# Patient Record
Sex: Female | Born: 1935 | Race: White | Hispanic: No | Marital: Married | Smoking: Never smoker
Health system: Southern US, Community
[De-identification: ages and names within clinical notes are randomized; demographics above are authoritative.]

## PROBLEM LIST (undated history)

## (undated) DIAGNOSIS — E785 Hyperlipidemia, unspecified: Secondary | ICD-10-CM

## (undated) DIAGNOSIS — L509 Urticaria, unspecified: Secondary | ICD-10-CM

## (undated) DIAGNOSIS — I4819 Other persistent atrial fibrillation: Secondary | ICD-10-CM

## (undated) DIAGNOSIS — I469 Cardiac arrest, cause unspecified: Secondary | ICD-10-CM

## (undated) DIAGNOSIS — I428 Other cardiomyopathies: Secondary | ICD-10-CM

## (undated) DIAGNOSIS — I442 Atrioventricular block, complete: Secondary | ICD-10-CM

## (undated) DIAGNOSIS — I10 Essential (primary) hypertension: Secondary | ICD-10-CM

## (undated) DIAGNOSIS — N19 Unspecified kidney failure: Secondary | ICD-10-CM

## (undated) DIAGNOSIS — H269 Unspecified cataract: Secondary | ICD-10-CM

## (undated) DIAGNOSIS — E039 Hypothyroidism, unspecified: Secondary | ICD-10-CM

## (undated) HISTORY — DX: Unspecified cataract: H26.9

## (undated) HISTORY — DX: Other persistent atrial fibrillation: I48.19

## (undated) HISTORY — DX: Essential (primary) hypertension: I10

## (undated) HISTORY — PX: PACEMAKER INSERTION: SHX728

## (undated) HISTORY — DX: Hyperlipidemia, unspecified: E78.5

## (undated) HISTORY — DX: Atrioventricular block, complete: I44.2

## (undated) HISTORY — DX: Hypothyroidism, unspecified: E03.9

## (undated) HISTORY — DX: Other cardiomyopathies: I42.8

## (undated) HISTORY — DX: Unspecified kidney failure: N19

## (undated) HISTORY — PX: CYST EXCISION: SHX5701

## (undated) HISTORY — DX: Urticaria, unspecified: L50.9

---

## 1985-03-03 HISTORY — PX: ABDOMINAL HYSTERECTOMY: SHX81

## 1994-03-03 HISTORY — PX: BUNIONECTOMY: SHX129

## 1997-01-01 HISTORY — PX: BREAST BIOPSY: SHX20

## 1997-11-08 ENCOUNTER — Other Ambulatory Visit: Admission: RE | Admit: 1997-11-08 | Discharge: 1997-11-08 | Payer: Self-pay | Admitting: Obstetrics and Gynecology

## 1998-12-11 ENCOUNTER — Other Ambulatory Visit: Admission: RE | Admit: 1998-12-11 | Discharge: 1998-12-11 | Payer: Self-pay | Admitting: Obstetrics and Gynecology

## 1999-12-11 ENCOUNTER — Other Ambulatory Visit: Admission: RE | Admit: 1999-12-11 | Discharge: 1999-12-11 | Payer: Self-pay | Admitting: Obstetrics and Gynecology

## 2000-03-03 DIAGNOSIS — I442 Atrioventricular block, complete: Secondary | ICD-10-CM

## 2000-03-03 HISTORY — DX: Atrioventricular block, complete: I44.2

## 2000-12-17 ENCOUNTER — Other Ambulatory Visit: Admission: RE | Admit: 2000-12-17 | Discharge: 2000-12-17 | Payer: Self-pay | Admitting: Obstetrics and Gynecology

## 2001-11-29 ENCOUNTER — Encounter: Admission: RE | Admit: 2001-11-29 | Discharge: 2001-11-29 | Payer: Self-pay | Admitting: Obstetrics and Gynecology

## 2001-11-29 ENCOUNTER — Encounter: Payer: Self-pay | Admitting: Obstetrics and Gynecology

## 2002-02-02 ENCOUNTER — Encounter (INDEPENDENT_AMBULATORY_CARE_PROVIDER_SITE_OTHER): Payer: Self-pay | Admitting: *Deleted

## 2002-02-02 ENCOUNTER — Ambulatory Visit (HOSPITAL_COMMUNITY): Admission: RE | Admit: 2002-02-02 | Discharge: 2002-02-02 | Payer: Self-pay | Admitting: *Deleted

## 2002-02-15 ENCOUNTER — Encounter: Payer: Self-pay | Admitting: General Surgery

## 2002-02-15 ENCOUNTER — Encounter: Admission: RE | Admit: 2002-02-15 | Discharge: 2002-02-15 | Payer: Self-pay | Admitting: General Surgery

## 2002-03-03 HISTORY — PX: OTHER SURGICAL HISTORY: SHX169

## 2002-03-07 ENCOUNTER — Encounter: Payer: Self-pay | Admitting: General Surgery

## 2002-03-11 ENCOUNTER — Encounter (INDEPENDENT_AMBULATORY_CARE_PROVIDER_SITE_OTHER): Payer: Self-pay | Admitting: Specialist

## 2002-03-11 ENCOUNTER — Inpatient Hospital Stay (HOSPITAL_COMMUNITY): Admission: RE | Admit: 2002-03-11 | Discharge: 2002-03-19 | Payer: Self-pay | Admitting: General Surgery

## 2002-08-05 ENCOUNTER — Encounter: Payer: Self-pay | Admitting: Obstetrics and Gynecology

## 2002-08-05 ENCOUNTER — Encounter: Admission: RE | Admit: 2002-08-05 | Discharge: 2002-08-05 | Payer: Self-pay | Admitting: Obstetrics and Gynecology

## 2003-03-24 ENCOUNTER — Ambulatory Visit (HOSPITAL_COMMUNITY): Admission: RE | Admit: 2003-03-24 | Discharge: 2003-03-24 | Payer: Self-pay | Admitting: *Deleted

## 2004-02-09 ENCOUNTER — Encounter: Admission: RE | Admit: 2004-02-09 | Discharge: 2004-02-09 | Payer: Self-pay | Admitting: Obstetrics and Gynecology

## 2005-02-28 ENCOUNTER — Encounter: Admission: RE | Admit: 2005-02-28 | Discharge: 2005-02-28 | Payer: Self-pay | Admitting: Obstetrics and Gynecology

## 2006-03-04 ENCOUNTER — Encounter: Admission: RE | Admit: 2006-03-04 | Discharge: 2006-03-04 | Payer: Self-pay | Admitting: Obstetrics and Gynecology

## 2006-06-16 ENCOUNTER — Encounter (INDEPENDENT_AMBULATORY_CARE_PROVIDER_SITE_OTHER): Payer: Self-pay | Admitting: Specialist

## 2006-06-16 ENCOUNTER — Ambulatory Visit (HOSPITAL_COMMUNITY): Admission: RE | Admit: 2006-06-16 | Discharge: 2006-06-16 | Payer: Self-pay | Admitting: Gastroenterology

## 2007-03-08 ENCOUNTER — Encounter: Admission: RE | Admit: 2007-03-08 | Discharge: 2007-03-08 | Payer: Self-pay | Admitting: Obstetrics & Gynecology

## 2007-04-09 ENCOUNTER — Inpatient Hospital Stay (HOSPITAL_COMMUNITY): Admission: EM | Admit: 2007-04-09 | Discharge: 2007-04-13 | Payer: Self-pay | Admitting: Emergency Medicine

## 2007-04-09 ENCOUNTER — Ambulatory Visit: Payer: Self-pay | Admitting: Cardiovascular Disease

## 2007-04-09 ENCOUNTER — Encounter (INDEPENDENT_AMBULATORY_CARE_PROVIDER_SITE_OTHER): Payer: Self-pay | Admitting: Interventional Cardiology

## 2008-03-17 ENCOUNTER — Encounter: Admission: RE | Admit: 2008-03-17 | Discharge: 2008-03-17 | Payer: Self-pay | Admitting: Family Medicine

## 2008-03-20 ENCOUNTER — Encounter: Admission: RE | Admit: 2008-03-20 | Discharge: 2008-03-20 | Payer: Self-pay | Admitting: Family Medicine

## 2008-06-01 DIAGNOSIS — L509 Urticaria, unspecified: Secondary | ICD-10-CM

## 2008-06-01 HISTORY — DX: Urticaria, unspecified: L50.9

## 2008-10-30 ENCOUNTER — Encounter: Payer: Self-pay | Admitting: Internal Medicine

## 2008-11-08 DIAGNOSIS — I428 Other cardiomyopathies: Secondary | ICD-10-CM

## 2008-11-08 DIAGNOSIS — E039 Hypothyroidism, unspecified: Secondary | ICD-10-CM | POA: Insufficient documentation

## 2008-11-08 DIAGNOSIS — R55 Syncope and collapse: Secondary | ICD-10-CM

## 2008-11-08 DIAGNOSIS — I5020 Unspecified systolic (congestive) heart failure: Secondary | ICD-10-CM | POA: Insufficient documentation

## 2008-11-09 ENCOUNTER — Ambulatory Visit: Payer: Self-pay | Admitting: Internal Medicine

## 2008-11-09 DIAGNOSIS — I442 Atrioventricular block, complete: Secondary | ICD-10-CM

## 2008-11-09 DIAGNOSIS — I4891 Unspecified atrial fibrillation: Secondary | ICD-10-CM | POA: Insufficient documentation

## 2008-11-13 ENCOUNTER — Telehealth: Payer: Self-pay | Admitting: Internal Medicine

## 2008-11-16 ENCOUNTER — Ambulatory Visit: Payer: Self-pay | Admitting: Internal Medicine

## 2008-11-16 ENCOUNTER — Inpatient Hospital Stay (HOSPITAL_COMMUNITY): Admission: AD | Admit: 2008-11-16 | Discharge: 2008-11-17 | Payer: Self-pay | Admitting: Internal Medicine

## 2008-11-17 ENCOUNTER — Encounter: Payer: Self-pay | Admitting: Internal Medicine

## 2008-11-21 ENCOUNTER — Encounter: Payer: Self-pay | Admitting: Internal Medicine

## 2008-11-29 ENCOUNTER — Ambulatory Visit: Payer: Self-pay

## 2008-11-29 ENCOUNTER — Encounter: Payer: Self-pay | Admitting: Internal Medicine

## 2008-12-11 ENCOUNTER — Telehealth: Payer: Self-pay | Admitting: Internal Medicine

## 2008-12-13 ENCOUNTER — Ambulatory Visit: Payer: Self-pay

## 2008-12-13 ENCOUNTER — Encounter: Payer: Self-pay | Admitting: Internal Medicine

## 2009-03-19 ENCOUNTER — Encounter: Admission: RE | Admit: 2009-03-19 | Discharge: 2009-03-19 | Payer: Self-pay | Admitting: Family Medicine

## 2009-03-23 ENCOUNTER — Ambulatory Visit: Payer: Self-pay | Admitting: Internal Medicine

## 2009-03-23 DIAGNOSIS — I10 Essential (primary) hypertension: Secondary | ICD-10-CM

## 2009-04-25 ENCOUNTER — Ambulatory Visit: Payer: Self-pay | Admitting: Internal Medicine

## 2009-05-23 ENCOUNTER — Ambulatory Visit: Payer: Self-pay | Admitting: Internal Medicine

## 2009-07-06 ENCOUNTER — Ambulatory Visit (HOSPITAL_COMMUNITY): Admission: RE | Admit: 2009-07-06 | Discharge: 2009-07-06 | Payer: Self-pay | Admitting: Gastroenterology

## 2010-03-09 ENCOUNTER — Inpatient Hospital Stay (HOSPITAL_COMMUNITY)
Admission: EM | Admit: 2010-03-09 | Discharge: 2010-03-21 | Disposition: A | Discharge status: Rehabilitation Facility | Payer: Self-pay | Source: Home / Self Care | Attending: Pulmonary Disease | Admitting: Pulmonary Disease

## 2010-03-10 ENCOUNTER — Encounter (INDEPENDENT_AMBULATORY_CARE_PROVIDER_SITE_OTHER): Payer: Self-pay | Admitting: Pulmonary Disease

## 2010-03-10 DIAGNOSIS — R6520 Severe sepsis without septic shock: Secondary | ICD-10-CM

## 2010-03-10 DIAGNOSIS — R6521 Severe sepsis with septic shock: Secondary | ICD-10-CM

## 2010-03-10 DIAGNOSIS — A419 Sepsis, unspecified organism: Secondary | ICD-10-CM

## 2010-03-11 ENCOUNTER — Encounter: Payer: Self-pay | Admitting: Internal Medicine

## 2010-03-18 LAB — BLOOD GAS, ARTERIAL
Acid-Base Excess: 5.3 mmol/L — ABNORMAL HIGH (ref 0.0–2.0)
Acid-Base Excess: 3.6 mmol/L — ABNORMAL HIGH (ref 0.0–2.0)
Acid-base deficit: 3.9 mmol/L — ABNORMAL HIGH (ref 0.0–2.0)
Bicarbonate: 27.2 mEq/L — ABNORMAL HIGH (ref 20.0–24.0)
Bicarbonate: 27.4 mEq/L — ABNORMAL HIGH (ref 20.0–24.0)
Bicarbonate: 27.3 mEq/L — ABNORMAL HIGH (ref 20.0–24.0)
Drawn by: 24513
FIO2: 40 %
FIO2: 40 %
FIO2: 40 %
MECHVT: 380 mL
MECHVT: 380 mL
MECHVT: 380 mL
O2 Saturation: 92.7 %
O2 Saturation: 94.5 %
O2 Saturation: 91.6 %
PEEP: 5 cmH2O
PEEP: 5 cmH2O
PEEP: 5 cmH2O
Patient temperature: 98
Patient temperature: 95.8
Patient temperature: 98.6
RATE: 35 resp/min
RATE: 20 resp/min
RATE: 20 resp/min
TCO2: 27.9 mmol/L (ref 0–100)
TCO2: 28.6 mmol/L (ref 0–100)
TCO2: 28.6 mmol/L (ref 0–100)
pCO2 arterial: 25.1 mmHg — ABNORMAL LOW (ref 35.0–45.0)
pCO2 arterial: 35.4 mmHg (ref 35.0–45.0)
pCO2 arterial: 39.4 mmHg (ref 35.0–45.0)
pH, Arterial: 7.635 (ref 7.350–7.400)
pH, Arterial: 7.494 — ABNORMAL HIGH (ref 7.350–7.400)
pH, Arterial: 7.456 — ABNORMAL HIGH (ref 7.350–7.400)
pO2, Arterial: 59.5 mmHg — ABNORMAL LOW (ref 80.0–100.0)
pO2, Arterial: 73.5 mmHg — ABNORMAL LOW (ref 80.0–100.0)
pO2, Arterial: 62.4 mmHg — ABNORMAL LOW (ref 80.0–100.0)

## 2010-03-18 LAB — BASIC METABOLIC PANEL
BUN: 22 mg/dL (ref 6–23)
BUN: 20 mg/dL (ref 6–23)
BUN: 20 mg/dL (ref 6–23)
BUN: 20 mg/dL (ref 6–23)
BUN: 29 mg/dL — ABNORMAL HIGH (ref 6–23)
BUN: 31 mg/dL — ABNORMAL HIGH (ref 6–23)
BUN: 33 mg/dL — ABNORMAL HIGH (ref 6–23)
BUN: 34 mg/dL — ABNORMAL HIGH (ref 6–23)
BUN: 44 mg/dL — ABNORMAL HIGH (ref 6–23)
CO2: 20 mEq/L (ref 19–32)
CO2: 18 mEq/L — ABNORMAL LOW (ref 19–32)
CO2: 23 mEq/L (ref 19–32)
CO2: 27 mEq/L (ref 19–32)
CO2: 28 mEq/L (ref 19–32)
CO2: 27 mEq/L (ref 19–32)
CO2: 30 mEq/L (ref 19–32)
CO2: 32 mEq/L (ref 19–32)
CO2: 27 mEq/L (ref 19–32)
Calcium: 8 mg/dL — ABNORMAL LOW (ref 8.4–10.5)
Calcium: 6.4 mg/dL — CL (ref 8.4–10.5)
Calcium: 6.7 mg/dL — ABNORMAL LOW (ref 8.4–10.5)
Calcium: 6.9 mg/dL — ABNORMAL LOW (ref 8.4–10.5)
Calcium: 7.4 mg/dL — ABNORMAL LOW (ref 8.4–10.5)
Calcium: 7.5 mg/dL — ABNORMAL LOW (ref 8.4–10.5)
Calcium: 7.9 mg/dL — ABNORMAL LOW (ref 8.4–10.5)
Calcium: 8 mg/dL — ABNORMAL LOW (ref 8.4–10.5)
Calcium: 7.9 mg/dL — ABNORMAL LOW (ref 8.4–10.5)
Chloride: 109 mEq/L (ref 96–112)
Chloride: 106 mEq/L (ref 96–112)
Chloride: 104 mEq/L (ref 96–112)
Chloride: 112 mEq/L (ref 96–112)
Chloride: 113 mEq/L — ABNORMAL HIGH (ref 96–112)
Chloride: 112 mEq/L (ref 96–112)
Chloride: 109 mEq/L (ref 96–112)
Chloride: 110 mEq/L (ref 96–112)
Chloride: 114 mEq/L — ABNORMAL HIGH (ref 96–112)
Creatinine, Ser: 2 mg/dL — ABNORMAL HIGH (ref 0.4–1.2)
Creatinine, Ser: 1.82 mg/dL — ABNORMAL HIGH (ref 0.4–1.2)
Creatinine, Ser: 1.43 mg/dL — ABNORMAL HIGH (ref 0.4–1.2)
Creatinine, Ser: 1.36 mg/dL — ABNORMAL HIGH (ref 0.4–1.2)
Creatinine, Ser: 1.45 mg/dL — ABNORMAL HIGH (ref 0.4–1.2)
Creatinine, Ser: 1.36 mg/dL — ABNORMAL HIGH (ref 0.4–1.2)
Creatinine, Ser: 1.43 mg/dL — ABNORMAL HIGH (ref 0.4–1.2)
Creatinine, Ser: 1.45 mg/dL — ABNORMAL HIGH (ref 0.4–1.2)
Creatinine, Ser: 1.82 mg/dL — ABNORMAL HIGH (ref 0.4–1.2)
GFR calc Af Amer: 29 mL/min — ABNORMAL LOW (ref >60)
GFR calc Af Amer: 33 mL/min — ABNORMAL LOW (ref >60)
GFR calc Af Amer: 43 mL/min — ABNORMAL LOW (ref >60)
GFR calc Af Amer: 46 mL/min — ABNORMAL LOW (ref >60)
GFR calc Af Amer: 43 mL/min — ABNORMAL LOW (ref >60)
GFR calc Af Amer: 46 mL/min — ABNORMAL LOW (ref >60)
GFR calc Af Amer: 43 mL/min — ABNORMAL LOW (ref >60)
GFR calc Af Amer: 43 mL/min — ABNORMAL LOW (ref >60)
GFR calc Af Amer: 33 mL/min — ABNORMAL LOW (ref >60)
GFR calc non Af Amer: 24 mL/min — ABNORMAL LOW (ref >60)
GFR calc non Af Amer: 27 mL/min — ABNORMAL LOW (ref >60)
GFR calc non Af Amer: 36 mL/min — ABNORMAL LOW (ref >60)
GFR calc non Af Amer: 38 mL/min — ABNORMAL LOW (ref >60)
GFR calc non Af Amer: 35 mL/min — ABNORMAL LOW (ref >60)
GFR calc non Af Amer: 38 mL/min — ABNORMAL LOW (ref >60)
GFR calc non Af Amer: 36 mL/min — ABNORMAL LOW (ref >60)
GFR calc non Af Amer: 35 mL/min — ABNORMAL LOW (ref >60)
GFR calc non Af Amer: 27 mL/min — ABNORMAL LOW (ref >60)
Glucose, Bld: 180 mg/dL — ABNORMAL HIGH (ref 70–99)
Glucose, Bld: 251 mg/dL — ABNORMAL HIGH (ref 70–99)
Glucose, Bld: 139 mg/dL — ABNORMAL HIGH (ref 70–99)
Glucose, Bld: 140 mg/dL — ABNORMAL HIGH (ref 70–99)
Glucose, Bld: 124 mg/dL — ABNORMAL HIGH (ref 70–99)
Glucose, Bld: 144 mg/dL — ABNORMAL HIGH (ref 70–99)
Glucose, Bld: 196 mg/dL — ABNORMAL HIGH (ref 70–99)
Glucose, Bld: 125 mg/dL — ABNORMAL HIGH (ref 70–99)
Glucose, Bld: 167 mg/dL — ABNORMAL HIGH (ref 70–99)
Potassium: 3.6 mEq/L (ref 3.5–5.1)
Potassium: 5.4 mEq/L — ABNORMAL HIGH (ref 3.5–5.1)
Potassium: 3.5 mEq/L (ref 3.5–5.1)
Potassium: 2.3 mEq/L — CL (ref 3.5–5.1)
Potassium: 3.8 mEq/L (ref 3.5–5.1)
Potassium: 3.6 mEq/L (ref 3.5–5.1)
Potassium: 3.3 mEq/L — ABNORMAL LOW (ref 3.5–5.1)
Potassium: 3.4 mEq/L — ABNORMAL LOW (ref 3.5–5.1)
Potassium: 3.5 mEq/L (ref 3.5–5.1)
Sodium: 140 mEq/L (ref 135–145)
Sodium: 132 mEq/L — ABNORMAL LOW (ref 135–145)
Sodium: 134 mEq/L — ABNORMAL LOW (ref 135–145)
Sodium: 148 mEq/L — ABNORMAL HIGH (ref 135–145)
Sodium: 148 mEq/L — ABNORMAL HIGH (ref 135–145)
Sodium: 145 mEq/L (ref 135–145)
Sodium: 151 mEq/L — ABNORMAL HIGH (ref 135–145)
Sodium: 151 mEq/L — ABNORMAL HIGH (ref 135–145)
Sodium: 151 mEq/L — ABNORMAL HIGH (ref 135–145)

## 2010-03-18 LAB — GLUCOSE, CAPILLARY
Glucose-Capillary: 128 mg/dL — ABNORMAL HIGH (ref 70–99)
Glucose-Capillary: 140 mg/dL — ABNORMAL HIGH (ref 70–99)
Glucose-Capillary: 168 mg/dL — ABNORMAL HIGH (ref 70–99)
Glucose-Capillary: 184 mg/dL — ABNORMAL HIGH (ref 70–99)
Glucose-Capillary: 218 mg/dL — ABNORMAL HIGH (ref 70–99)
Glucose-Capillary: 228 mg/dL — ABNORMAL HIGH (ref 70–99)
Glucose-Capillary: 127 mg/dL — ABNORMAL HIGH (ref 70–99)
Glucose-Capillary: 134 mg/dL — ABNORMAL HIGH (ref 70–99)
Glucose-Capillary: 141 mg/dL — ABNORMAL HIGH (ref 70–99)
Glucose-Capillary: 144 mg/dL — ABNORMAL HIGH (ref 70–99)
Glucose-Capillary: 150 mg/dL — ABNORMAL HIGH (ref 70–99)
Glucose-Capillary: 159 mg/dL — ABNORMAL HIGH (ref 70–99)
Glucose-Capillary: 175 mg/dL — ABNORMAL HIGH (ref 70–99)
Glucose-Capillary: 177 mg/dL — ABNORMAL HIGH (ref 70–99)
Glucose-Capillary: 182 mg/dL — ABNORMAL HIGH (ref 70–99)
Glucose-Capillary: 194 mg/dL — ABNORMAL HIGH (ref 70–99)
Glucose-Capillary: 201 mg/dL — ABNORMAL HIGH (ref 70–99)
Glucose-Capillary: 100 mg/dL — ABNORMAL HIGH (ref 70–99)
Glucose-Capillary: 113 mg/dL — ABNORMAL HIGH (ref 70–99)
Glucose-Capillary: 113 mg/dL — ABNORMAL HIGH (ref 70–99)
Glucose-Capillary: 118 mg/dL — ABNORMAL HIGH (ref 70–99)
Glucose-Capillary: 119 mg/dL — ABNORMAL HIGH (ref 70–99)
Glucose-Capillary: 136 mg/dL — ABNORMAL HIGH (ref 70–99)
Glucose-Capillary: 140 mg/dL — ABNORMAL HIGH (ref 70–99)
Glucose-Capillary: 144 mg/dL — ABNORMAL HIGH (ref 70–99)
Glucose-Capillary: 145 mg/dL — ABNORMAL HIGH (ref 70–99)
Glucose-Capillary: 145 mg/dL — ABNORMAL HIGH (ref 70–99)
Glucose-Capillary: 156 mg/dL — ABNORMAL HIGH (ref 70–99)
Glucose-Capillary: 157 mg/dL — ABNORMAL HIGH (ref 70–99)
Glucose-Capillary: 115 mg/dL — ABNORMAL HIGH (ref 70–99)
Glucose-Capillary: 126 mg/dL — ABNORMAL HIGH (ref 70–99)
Glucose-Capillary: 152 mg/dL — ABNORMAL HIGH (ref 70–99)
Glucose-Capillary: 153 mg/dL — ABNORMAL HIGH (ref 70–99)
Glucose-Capillary: 157 mg/dL — ABNORMAL HIGH (ref 70–99)
Glucose-Capillary: 162 mg/dL — ABNORMAL HIGH (ref 70–99)
Glucose-Capillary: 162 mg/dL — ABNORMAL HIGH (ref 70–99)
Glucose-Capillary: 165 mg/dL — ABNORMAL HIGH (ref 70–99)
Glucose-Capillary: 166 mg/dL — ABNORMAL HIGH (ref 70–99)
Glucose-Capillary: 171 mg/dL — ABNORMAL HIGH (ref 70–99)
Glucose-Capillary: 174 mg/dL — ABNORMAL HIGH (ref 70–99)
Glucose-Capillary: 175 mg/dL — ABNORMAL HIGH (ref 70–99)
Glucose-Capillary: 183 mg/dL — ABNORMAL HIGH (ref 70–99)
Glucose-Capillary: 157 mg/dL — ABNORMAL HIGH (ref 70–99)
Glucose-Capillary: 160 mg/dL — ABNORMAL HIGH (ref 70–99)
Glucose-Capillary: 160 mg/dL — ABNORMAL HIGH (ref 70–99)
Glucose-Capillary: 172 mg/dL — ABNORMAL HIGH (ref 70–99)
Glucose-Capillary: 177 mg/dL — ABNORMAL HIGH (ref 70–99)
Glucose-Capillary: 180 mg/dL — ABNORMAL HIGH (ref 70–99)

## 2010-03-18 LAB — CBC
HCT: 42 % (ref 36.0–46.0)
HCT: 48.4 % — ABNORMAL HIGH (ref 36.0–46.0)
HCT: 54.4 % — ABNORMAL HIGH (ref 36.0–46.0)
HCT: 56.1 % — ABNORMAL HIGH (ref 36.0–46.0)
HCT: 30.1 % — ABNORMAL LOW (ref 36.0–46.0)
HCT: 40.8 % (ref 36.0–46.0)
HCT: 29.9 % — ABNORMAL LOW (ref 36.0–46.0)
HCT: 30.9 % — ABNORMAL LOW (ref 36.0–46.0)
HCT: 30.7 % — ABNORMAL LOW (ref 36.0–46.0)
HCT: 32.3 % — ABNORMAL LOW (ref 36.0–46.0)
HCT: 33.6 % — ABNORMAL LOW (ref 36.0–46.0)
Hemoglobin: 13.9 g/dL (ref 12.0–15.0)
Hemoglobin: 15.9 g/dL — ABNORMAL HIGH (ref 12.0–15.0)
Hemoglobin: 17.9 g/dL — ABNORMAL HIGH (ref 12.0–15.0)
Hemoglobin: 19 g/dL — ABNORMAL HIGH (ref 12.0–15.0)
Hemoglobin: 10.4 g/dL — ABNORMAL LOW (ref 12.0–15.0)
Hemoglobin: 13.6 g/dL (ref 12.0–15.0)
Hemoglobin: 10.4 g/dL — ABNORMAL LOW (ref 12.0–15.0)
Hemoglobin: 9.7 g/dL — ABNORMAL LOW (ref 12.0–15.0)
Hemoglobin: 10.6 g/dL — ABNORMAL LOW (ref 12.0–15.0)
Hemoglobin: 9.7 g/dL — ABNORMAL LOW (ref 12.0–15.0)
Hemoglobin: 10.7 g/dL — ABNORMAL LOW (ref 12.0–15.0)
MCH: 30.7 pg (ref 26.0–34.0)
MCH: 31.2 pg (ref 26.0–34.0)
MCH: 31.2 pg (ref 26.0–34.0)
MCH: 31.7 pg (ref 26.0–34.0)
MCH: 30.8 pg (ref 26.0–34.0)
MCH: 31 pg (ref 26.0–34.0)
MCH: 30.2 pg (ref 26.0–34.0)
MCH: 30.4 pg (ref 26.0–34.0)
MCH: 29.6 pg (ref 26.0–34.0)
MCH: 30.7 pg (ref 26.0–34.0)
MCH: 30.2 pg (ref 26.0–34.0)
MCHC: 32.9 g/dL (ref 30.0–36.0)
MCHC: 32.9 g/dL (ref 30.0–36.0)
MCHC: 33.1 g/dL (ref 30.0–36.0)
MCHC: 33.9 g/dL (ref 30.0–36.0)
MCHC: 33.3 g/dL (ref 30.0–36.0)
MCHC: 34.6 g/dL (ref 30.0–36.0)
MCHC: 32.4 g/dL (ref 30.0–36.0)
MCHC: 33.7 g/dL (ref 30.0–36.0)
MCHC: 31.6 g/dL (ref 30.0–36.0)
MCHC: 32.8 g/dL (ref 30.0–36.0)
MCHC: 31.8 g/dL (ref 30.0–36.0)
MCV: 92.7 fL (ref 78.0–100.0)
MCV: 93.7 fL (ref 78.0–100.0)
MCV: 94.9 fL (ref 78.0–100.0)
MCV: 94.9 fL (ref 78.0–100.0)
MCV: 89.9 fL (ref 78.0–100.0)
MCV: 92.5 fL (ref 78.0–100.0)
MCV: 90.4 fL (ref 78.0–100.0)
MCV: 93.1 fL (ref 78.0–100.0)
MCV: 93.6 fL (ref 78.0–100.0)
MCV: 93.6 fL (ref 78.0–100.0)
MCV: 94.9 fL (ref 78.0–100.0)
Platelets: DECREASED 10*3/uL (ref 150–400)
Platelets: UNDETERMINED 10*3/uL (ref 150–400)
Platelets: 110 10*3/uL — ABNORMAL LOW (ref 150–400)
Platelets: 103 10*3/uL — ABNORMAL LOW (ref 150–400)
Platelets: 118 10*3/uL — ABNORMAL LOW (ref 150–400)
Platelets: 137 10*3/uL — ABNORMAL LOW (ref 150–400)
Platelets: 179 10*3/uL (ref 150–400)
Platelets: 185 10*3/uL (ref 150–400)
RBC: 4.53 MIL/uL (ref 3.87–5.11)
RBC: 5.1 MIL/uL (ref 3.87–5.11)
RBC: 5.73 MIL/uL — ABNORMAL HIGH (ref 3.87–5.11)
RBC: 5.99 MIL/uL — ABNORMAL HIGH (ref 3.87–5.11)
RBC: 3.35 MIL/uL — ABNORMAL LOW (ref 3.87–5.11)
RBC: 4.41 MIL/uL (ref 3.87–5.11)
RBC: 3.21 MIL/uL — ABNORMAL LOW (ref 3.87–5.11)
RBC: 3.42 MIL/uL — ABNORMAL LOW (ref 3.87–5.11)
RBC: 3.28 MIL/uL — ABNORMAL LOW (ref 3.87–5.11)
RBC: 3.45 MIL/uL — ABNORMAL LOW (ref 3.87–5.11)
RBC: 3.54 MIL/uL — ABNORMAL LOW (ref 3.87–5.11)
RDW: 14.2 % (ref 11.5–15.5)
RDW: 14.4 % (ref 11.5–15.5)
RDW: 14.4 % (ref 11.5–15.5)
RDW: 14.5 % (ref 11.5–15.5)
RDW: 14.3 % (ref 11.5–15.5)
RDW: 14.7 % (ref 11.5–15.5)
RDW: 14.7 % (ref 11.5–15.5)
RDW: 15.3 % (ref 11.5–15.5)
RDW: 15.3 % (ref 11.5–15.5)
RDW: 15.3 % (ref 11.5–15.5)
RDW: 15.3 % (ref 11.5–15.5)
WBC: 17.9 10*3/uL — ABNORMAL HIGH (ref 4.0–10.5)
WBC: 22.9 10*3/uL — ABNORMAL HIGH (ref 4.0–10.5)
WBC: 26.8 10*3/uL — ABNORMAL HIGH (ref 4.0–10.5)
WBC: 6 10*3/uL (ref 4.0–10.5)
WBC: 21.2 10*3/uL — ABNORMAL HIGH (ref 4.0–10.5)
WBC: 27.1 10*3/uL — ABNORMAL HIGH (ref 4.0–10.5)
WBC: 15.2 10*3/uL — ABNORMAL HIGH (ref 4.0–10.5)
WBC: 15.6 10*3/uL — ABNORMAL HIGH (ref 4.0–10.5)
WBC: 19.3 10*3/uL — ABNORMAL HIGH (ref 4.0–10.5)
WBC: 20.3 10*3/uL — ABNORMAL HIGH (ref 4.0–10.5)
WBC: 24.3 10*3/uL — ABNORMAL HIGH (ref 4.0–10.5)

## 2010-03-18 LAB — POCT I-STAT 3, ART BLOOD GAS (G3+)
Acid-Base Excess: 8 mmol/L — ABNORMAL HIGH (ref 0.0–2.0)
Acid-Base Excess: 10 mmol/L — ABNORMAL HIGH (ref 0.0–2.0)
Acid-Base Excess: 11 mmol/L — ABNORMAL HIGH (ref 0.0–2.0)
Acid-base deficit: 10 mmol/L — ABNORMAL HIGH (ref 0.0–2.0)
Acid-base deficit: 11 mmol/L — ABNORMAL HIGH (ref 0.0–2.0)
Acid-base deficit: 12 mmol/L — ABNORMAL HIGH (ref 0.0–2.0)
Acid-base deficit: 14 mmol/L — ABNORMAL HIGH (ref 0.0–2.0)
Acid-base deficit: 14 mmol/L — ABNORMAL HIGH (ref 0.0–2.0)
Acid-base deficit: 17 mmol/L — ABNORMAL HIGH (ref 0.0–2.0)
Acid-base deficit: 21 mmol/L — ABNORMAL HIGH (ref 0.0–2.0)
Acid-base deficit: 7 mmol/L — ABNORMAL HIGH (ref 0.0–2.0)
Acid-base deficit: 8 mmol/L — ABNORMAL HIGH (ref 0.0–2.0)
Bicarbonate: 12 mEq/L — ABNORMAL LOW (ref 20.0–24.0)
Bicarbonate: 12 mEq/L — ABNORMAL LOW (ref 20.0–24.0)
Bicarbonate: 13.2 mEq/L — ABNORMAL LOW (ref 20.0–24.0)
Bicarbonate: 13.4 mEq/L — ABNORMAL LOW (ref 20.0–24.0)
Bicarbonate: 14 mEq/L — ABNORMAL LOW (ref 20.0–24.0)
Bicarbonate: 16.7 mEq/L — ABNORMAL LOW (ref 20.0–24.0)
Bicarbonate: 19.2 mEq/L — ABNORMAL LOW (ref 20.0–24.0)
Bicarbonate: 4.9 mEq/L — ABNORMAL LOW (ref 20.0–24.0)
Bicarbonate: 16.6 mEq/L — ABNORMAL LOW (ref 20.0–24.0)
Bicarbonate: 30.7 mEq/L — ABNORMAL HIGH (ref 20.0–24.0)
Bicarbonate: 32.4 mEq/L — ABNORMAL HIGH (ref 20.0–24.0)
Bicarbonate: 34.3 mEq/L — ABNORMAL HIGH (ref 20.0–24.0)
O2 Saturation: 48 %
O2 Saturation: 85 %
O2 Saturation: 87 %
O2 Saturation: 88 %
O2 Saturation: 92 %
O2 Saturation: 95 %
O2 Saturation: 96 %
O2 Saturation: 96 %
O2 Saturation: 97 %
O2 Saturation: 90 %
O2 Saturation: 91 %
O2 Saturation: 95 %
Patient temperature: 35.9
Patient temperature: 36
Patient temperature: 36.2
Patient temperature: 36.2
Patient temperature: 36.4
Patient temperature: 36.4
Patient temperature: 36.6
Patient temperature: 37
Patient temperature: 99.5
Patient temperature: 37.4
Patient temperature: 37.7
Patient temperature: 38
TCO2: 13 mmol/L (ref 0–100)
TCO2: 13 mmol/L (ref 0–100)
TCO2: 14 mmol/L (ref 0–100)
TCO2: 14 mmol/L (ref 0–100)
TCO2: 15 mmol/L (ref 0–100)
TCO2: 18 mmol/L (ref 0–100)
TCO2: 20 mmol/L (ref 0–100)
TCO2: 5 mmol/L (ref 0–100)
TCO2: 18 mmol/L (ref 0–100)
TCO2: 32 mmol/L (ref 0–100)
TCO2: 33 mmol/L (ref 0–100)
TCO2: 35 mmol/L (ref 0–100)
pCO2 arterial: 12.4 mmHg — CL (ref 35.0–45.0)
pCO2 arterial: 25.2 mmHg — ABNORMAL LOW (ref 35.0–45.0)
pCO2 arterial: 26.5 mmHg — ABNORMAL LOW (ref 35.0–45.0)
pCO2 arterial: 32.7 mmHg — ABNORMAL LOW (ref 35.0–45.0)
pCO2 arterial: 34.1 mmHg — ABNORMAL LOW (ref 35.0–45.0)
pCO2 arterial: 36.8 mmHg (ref 35.0–45.0)
pCO2 arterial: 38 mmHg (ref 35.0–45.0)
pCO2 arterial: 41.1 mmHg (ref 35.0–45.0)
pCO2 arterial: 31.4 mmHg — ABNORMAL LOW (ref 35.0–45.0)
pCO2 arterial: 37.5 mmHg (ref 35.0–45.0)
pCO2 arterial: 37.5 mmHg (ref 35.0–45.0)
pCO2 arterial: 39.7 mmHg (ref 35.0–45.0)
pH, Arterial: 7.116 — CL (ref 7.350–7.400)
pH, Arterial: 7.19 — CL (ref 7.350–7.400)
pH, Arterial: 7.201 — ABNORMAL LOW (ref 7.350–7.400)
pH, Arterial: 7.237 — ABNORMAL LOW (ref 7.350–7.400)
pH, Arterial: 7.248 — ABNORMAL LOW (ref 7.350–7.400)
pH, Arterial: 7.256 — ABNORMAL LOW (ref 7.350–7.400)
pH, Arterial: 7.277 — ABNORMAL LOW (ref 7.350–7.400)
pH, Arterial: 7.329 — ABNORMAL LOW (ref 7.350–7.400)
pH, Arterial: 7.334 — ABNORMAL LOW (ref 7.350–7.400)
pH, Arterial: 7.522 — ABNORMAL HIGH (ref 7.350–7.400)
pH, Arterial: 7.546 — ABNORMAL HIGH (ref 7.350–7.400)
pH, Arterial: 7.548 — ABNORMAL HIGH (ref 7.350–7.400)
pO2, Arterial: 33 mmHg — CL (ref 80.0–100.0)
pO2, Arterial: 56 mmHg — ABNORMAL LOW (ref 80.0–100.0)
pO2, Arterial: 59 mmHg — ABNORMAL LOW (ref 80.0–100.0)
pO2, Arterial: 63 mmHg — ABNORMAL LOW (ref 80.0–100.0)
pO2, Arterial: 73 mmHg — ABNORMAL LOW (ref 80.0–100.0)
pO2, Arterial: 81 mmHg (ref 80.0–100.0)
pO2, Arterial: 87 mmHg (ref 80.0–100.0)
pO2, Arterial: 89 mmHg (ref 80.0–100.0)
pO2, Arterial: 93 mmHg (ref 80.0–100.0)
pO2, Arterial: 54 mmHg — ABNORMAL LOW (ref 80.0–100.0)
pO2, Arterial: 55 mmHg — ABNORMAL LOW (ref 80.0–100.0)
pO2, Arterial: 67 mmHg — ABNORMAL LOW (ref 80.0–100.0)

## 2010-03-18 LAB — COMPREHENSIVE METABOLIC PANEL
ALT: 44 U/L — ABNORMAL HIGH (ref 0–35)
ALT: 52 U/L — ABNORMAL HIGH (ref 0–35)
AST: 55 U/L — ABNORMAL HIGH (ref 0–37)
AST: 50 U/L — ABNORMAL HIGH (ref 0–37)
Albumin: 3 g/dL — ABNORMAL LOW (ref 3.5–5.2)
Albumin: 1.9 g/dL — ABNORMAL LOW (ref 3.5–5.2)
Alkaline Phosphatase: 98 U/L (ref 39–117)
Alkaline Phosphatase: 53 U/L (ref 39–117)
BUN: 23 mg/dL (ref 6–23)
BUN: 20 mg/dL (ref 6–23)
CO2: 21 mEq/L (ref 19–32)
CO2: 19 mEq/L (ref 19–32)
Calcium: 8.3 mg/dL — ABNORMAL LOW (ref 8.4–10.5)
Calcium: 6.2 mg/dL — CL (ref 8.4–10.5)
Chloride: 105 mEq/L (ref 96–112)
Chloride: 108 mEq/L (ref 96–112)
Creatinine, Ser: 1.69 mg/dL — ABNORMAL HIGH (ref 0.4–1.2)
Creatinine, Ser: 1.86 mg/dL — ABNORMAL HIGH (ref 0.4–1.2)
GFR calc Af Amer: 36 mL/min — ABNORMAL LOW (ref >60)
GFR calc Af Amer: 32 mL/min — ABNORMAL LOW (ref >60)
GFR calc non Af Amer: 30 mL/min — ABNORMAL LOW (ref >60)
GFR calc non Af Amer: 26 mL/min — ABNORMAL LOW (ref >60)
Glucose, Bld: 155 mg/dL — ABNORMAL HIGH (ref 70–99)
Glucose, Bld: 203 mg/dL — ABNORMAL HIGH (ref 70–99)
Potassium: 2.9 mEq/L — ABNORMAL LOW (ref 3.5–5.1)
Potassium: 5.1 mEq/L (ref 3.5–5.1)
Sodium: 142 mEq/L (ref 135–145)
Sodium: 134 mEq/L — ABNORMAL LOW (ref 135–145)
Total Bilirubin: 0.7 mg/dL (ref 0.3–1.2)
Total Bilirubin: 0.5 mg/dL (ref 0.3–1.2)
Total Protein: 6.4 g/dL (ref 6.0–8.3)
Total Protein: 4.3 g/dL — ABNORMAL LOW (ref 6.0–8.3)

## 2010-03-18 LAB — DIC (DISSEMINATED INTRAVASCULAR COAGULATION)PANEL
D-Dimer, Quant: >20 ug/mL-FEU — ABNORMAL HIGH (ref 0.00–0.48)
Fibrinogen: 219 mg/dL (ref 204–475)
INR: 1.48 (ref 0.00–1.49)
Platelets: DECREASED 10*3/uL (ref 150–400)
Prothrombin Time: 18.1 seconds — ABNORMAL HIGH (ref 11.6–15.2)
Smear Review: NONE SEEN
aPTT: 33 seconds (ref 24–37)

## 2010-03-18 LAB — PROCALCITONIN
Procalcitonin: 0.4 ng/mL
Procalcitonin: 83.17 ng/mL

## 2010-03-18 LAB — TRIGLYCERIDES: Triglycerides: 91 mg/dL (ref <150)

## 2010-03-18 LAB — TSH: TSH: 4.313 u[IU]/mL (ref 0.350–4.500)

## 2010-03-18 LAB — PHOSPHORUS
Phosphorus: 4.4 mg/dL (ref 2.3–4.6)
Phosphorus: 2.2 mg/dL — ABNORMAL LOW (ref 2.3–4.6)
Phosphorus: 2.8 mg/dL (ref 2.3–4.6)
Phosphorus: 3 mg/dL (ref 2.3–4.6)
Phosphorus: 3.2 mg/dL (ref 2.3–4.6)
Phosphorus: 2.6 mg/dL (ref 2.3–4.6)
Phosphorus: 3.7 mg/dL (ref 2.3–4.6)

## 2010-03-18 LAB — DIFFERENTIAL
Basophils Absolute: 0 10*3/uL (ref 0.0–0.1)
Basophils Absolute: 0 10*3/uL (ref 0.0–0.1)
Basophils Relative: 0 % (ref 0–1)
Basophils Relative: 0 % (ref 0–1)
Eosinophils Absolute: 0 10*3/uL (ref 0.0–0.7)
Eosinophils Absolute: 0 10*3/uL (ref 0.0–0.7)
Eosinophils Relative: 0 % (ref 0–5)
Eosinophils Relative: 0 % (ref 0–5)
Lymphocytes Relative: 26 % (ref 12–46)
Lymphocytes Relative: 4 % — ABNORMAL LOW (ref 12–46)
Lymphs Abs: 1.6 10*3/uL (ref 0.7–4.0)
Lymphs Abs: 1.1 10*3/uL (ref 0.7–4.0)
Monocytes Absolute: 0.4 10*3/uL (ref 0.1–1.0)
Monocytes Absolute: 1.1 10*3/uL — ABNORMAL HIGH (ref 0.1–1.0)
Monocytes Relative: 7 % (ref 3–12)
Monocytes Relative: 4 % (ref 3–12)
Neutro Abs: 4 10*3/uL (ref 1.7–7.7)
Neutro Abs: 24.9 10*3/uL — ABNORMAL HIGH (ref 1.7–7.7)
Neutrophils Relative %: 67 % (ref 43–77)
Neutrophils Relative %: 92 % — ABNORMAL HIGH (ref 43–77)

## 2010-03-18 LAB — URINE CULTURE
Colony Count: NO GROWTH
Culture  Setup Time: 201201081127
Culture: NO GROWTH

## 2010-03-18 LAB — POCT I-STAT, CHEM 8
BUN: 26 mg/dL — ABNORMAL HIGH (ref 6–23)
Calcium, Ion: 1.02 mmol/L — ABNORMAL LOW (ref 1.12–1.32)
Chloride: 108 mEq/L (ref 96–112)
Creatinine, Ser: 1.9 mg/dL — ABNORMAL HIGH (ref 0.4–1.2)
Glucose, Bld: 154 mg/dL — ABNORMAL HIGH (ref 70–99)
HCT: 58 % — ABNORMAL HIGH (ref 36.0–46.0)
Hemoglobin: 19.7 g/dL — ABNORMAL HIGH (ref 12.0–15.0)
Potassium: 2.6 mEq/L — CL (ref 3.5–5.1)
Sodium: 141 mEq/L (ref 135–145)
TCO2: 20 mmol/L (ref 0–100)

## 2010-03-18 LAB — CLOSTRIDIUM DIFFICILE BY PCR: Toxigenic C. Difficile by PCR: NEGATIVE

## 2010-03-18 LAB — URINALYSIS, ROUTINE W REFLEX MICROSCOPIC
Bilirubin Urine: NEGATIVE
Hgb urine dipstick: NEGATIVE
Ketones, ur: NEGATIVE mg/dL
Nitrite: NEGATIVE
Protein, ur: 30 mg/dL — AB
Specific Gravity, Urine: 1.012 (ref 1.005–1.030)
Urine Glucose, Fasting: NEGATIVE mg/dL
Urobilinogen, UA: 0.2 mg/dL (ref 0.0–1.0)
pH: 5.5 (ref 5.0–8.0)

## 2010-03-18 LAB — CULTURE, BAL-QUANTITATIVE W GRAM STAIN
Colony Count: NO GROWTH
Culture: NO GROWTH

## 2010-03-18 LAB — BRAIN NATRIURETIC PEPTIDE
Pro B Natriuretic peptide (BNP): 36 pg/mL (ref 0.0–100.0)
Pro B Natriuretic peptide (BNP): 248 pg/mL — ABNORMAL HIGH (ref 0.0–100.0)

## 2010-03-18 LAB — POCT CARDIAC MARKERS
CKMB, poc: <1 ng/mL — ABNORMAL LOW (ref 1.0–8.0)
Myoglobin, poc: 105 ng/mL (ref 12–200)
Troponin i, poc: <0.05 ng/mL (ref 0.00–0.09)

## 2010-03-18 LAB — CULTURE, BLOOD (ROUTINE X 2)
Culture  Setup Time: 201201081126
Culture  Setup Time: 201201081126
Culture: NO GROWTH
Culture: NO GROWTH

## 2010-03-18 LAB — CARBOXYHEMOGLOBIN
Carboxyhemoglobin: 0.3 % — ABNORMAL LOW (ref 0.5–1.5)
Carboxyhemoglobin: 0.3 % — ABNORMAL LOW (ref 0.5–1.5)
Carboxyhemoglobin: 0.2 % — ABNORMAL LOW (ref 0.5–1.5)
Methemoglobin: 0.9 % (ref 0.0–1.5)
Methemoglobin: 0.9 % (ref 0.0–1.5)
Methemoglobin: 0.7 % (ref 0.0–1.5)
O2 Saturation: 65.3 %
O2 Saturation: 43.4 %
O2 Saturation: 51.2 %
Total hemoglobin: 18.3 g/dL — ABNORMAL HIGH (ref 12.5–16.0)
Total hemoglobin: 17.8 g/dL — ABNORMAL HIGH (ref 12.5–16.0)
Total hemoglobin: 15.8 g/dL (ref 12.5–16.0)

## 2010-03-18 LAB — MRSA PCR SCREENING: MRSA by PCR: NEGATIVE

## 2010-03-18 LAB — LACTIC ACID, PLASMA
Lactic Acid, Venous: 4 mmol/L — ABNORMAL HIGH (ref 0.5–2.2)
Lactic Acid, Venous: 4.3 mmol/L — ABNORMAL HIGH (ref 0.5–2.2)

## 2010-03-18 LAB — TYPE AND SCREEN
ABO/RH(D): A POS
Antibody Screen: NEGATIVE

## 2010-03-18 LAB — ABO/RH: ABO/RH(D): A POS

## 2010-03-18 LAB — CORTISOL
Cortisol, Plasma: 19.7 ug/dL
Cortisol, Plasma: 24.3 ug/dL

## 2010-03-18 LAB — URINE MICROSCOPIC-ADD ON

## 2010-03-18 LAB — PROTIME-INR
INR: 1.25 (ref 0.00–1.49)
Prothrombin Time: 15.9 seconds — ABNORMAL HIGH (ref 11.6–15.2)

## 2010-03-18 LAB — CARDIAC PANEL(CRET KIN+CKTOT+MB+TROPI)
CK, MB: 4.9 ng/mL — ABNORMAL HIGH (ref 0.3–4.0)
CK, MB: 4.6 ng/mL — ABNORMAL HIGH (ref 0.3–4.0)
Relative Index: INVALID (ref 0.0–2.5)
Relative Index: INVALID (ref 0.0–2.5)
Total CK: 46 U/L (ref 7–177)
Total CK: 41 U/L (ref 7–177)
Troponin I: 0.41 ng/mL — ABNORMAL HIGH (ref 0.00–0.06)
Troponin I: 0.22 ng/mL — ABNORMAL HIGH (ref 0.00–0.06)

## 2010-03-18 LAB — STREP PNEUMONIAE URINARY ANTIGEN: Strep Pneumo Urinary Antigen: NEGATIVE

## 2010-03-18 LAB — CK TOTAL AND CKMB (NOT AT ARMC)
CK, MB: 3.5 ng/mL (ref 0.3–4.0)
Relative Index: INVALID (ref 0.0–2.5)
Total CK: 71 U/L (ref 7–177)

## 2010-03-18 LAB — MAGNESIUM
Magnesium: 1.9 mg/dL (ref 1.5–2.5)
Magnesium: 1.9 mg/dL (ref 1.5–2.5)
Magnesium: 2.5 mg/dL (ref 1.5–2.5)
Magnesium: 2.6 mg/dL — ABNORMAL HIGH (ref 1.5–2.5)
Magnesium: 2.6 mg/dL — ABNORMAL HIGH (ref 1.5–2.5)
Magnesium: 2.4 mg/dL (ref 1.5–2.5)
Magnesium: 2.2 mg/dL (ref 1.5–2.5)

## 2010-03-18 LAB — D-DIMER, QUANTITATIVE: D-Dimer, Quant: >20 ug/mL-FEU — ABNORMAL HIGH (ref 0.00–0.48)

## 2010-03-18 LAB — TROPONIN I: Troponin I: 0.13 ng/mL — ABNORMAL HIGH (ref 0.00–0.06)

## 2010-03-18 LAB — CHOLESTEROL, TOTAL: Cholesterol: 150 mg/dL (ref 0–200)

## 2010-03-18 LAB — SODIUM, URINE, RANDOM: Sodium, Ur: 84 mEq/L

## 2010-03-18 LAB — APTT: aPTT: 30 seconds (ref 24–37)

## 2010-03-18 LAB — CREATININE, URINE, RANDOM: Creatinine, Urine: 75.4 mg/dL

## 2010-03-20 LAB — CBC
HCT: 30.1 % — ABNORMAL LOW (ref 36.0–46.0)
HCT: 32.9 % — ABNORMAL LOW (ref 36.0–46.0)
HCT: 26.6 % — ABNORMAL LOW (ref 36.0–46.0)
Hemoglobin: 9.6 g/dL — ABNORMAL LOW (ref 12.0–15.0)
Hemoglobin: 10.5 g/dL — ABNORMAL LOW (ref 12.0–15.0)
Hemoglobin: 8.2 g/dL — ABNORMAL LOW (ref 12.0–15.0)
MCH: 30.4 pg (ref 26.0–34.0)
MCH: 30.6 pg (ref 26.0–34.0)
MCH: 30.1 pg (ref 26.0–34.0)
MCHC: 31.9 g/dL (ref 30.0–36.0)
MCHC: 31.9 g/dL (ref 30.0–36.0)
MCHC: 30.8 g/dL (ref 30.0–36.0)
MCV: 95.3 fL (ref 78.0–100.0)
MCV: 95.9 fL (ref 78.0–100.0)
MCV: 97.8 fL (ref 78.0–100.0)
Platelets: 222 10*3/uL (ref 150–400)
Platelets: 195 10*3/uL (ref 150–400)
RBC: 3.16 MIL/uL — ABNORMAL LOW (ref 3.87–5.11)
RBC: 3.43 MIL/uL — ABNORMAL LOW (ref 3.87–5.11)
RBC: 2.72 MIL/uL — ABNORMAL LOW (ref 3.87–5.11)
RDW: 15.4 % (ref 11.5–15.5)
RDW: 15.5 % (ref 11.5–15.5)
RDW: 15.9 % — ABNORMAL HIGH (ref 11.5–15.5)
WBC: 17 10*3/uL — ABNORMAL HIGH (ref 4.0–10.5)
WBC: 18.6 10*3/uL — ABNORMAL HIGH (ref 4.0–10.5)
WBC: 13.3 10*3/uL — ABNORMAL HIGH (ref 4.0–10.5)

## 2010-03-20 LAB — URINE CULTURE
Colony Count: 75000
Culture  Setup Time: 201201151141

## 2010-03-20 LAB — BRAIN NATRIURETIC PEPTIDE: Pro B Natriuretic peptide (BNP): 95 pg/mL (ref 0.0–100.0)

## 2010-03-20 LAB — COMPREHENSIVE METABOLIC PANEL
ALT: 32 U/L (ref 0–35)
AST: 24 U/L (ref 0–37)
Albumin: 1.9 g/dL — ABNORMAL LOW (ref 3.5–5.2)
Alkaline Phosphatase: 52 U/L (ref 39–117)
BUN: 40 mg/dL — ABNORMAL HIGH (ref 6–23)
CO2: 25 mEq/L (ref 19–32)
Calcium: 8 mg/dL — ABNORMAL LOW (ref 8.4–10.5)
Chloride: 117 mEq/L — ABNORMAL HIGH (ref 96–112)
Creatinine, Ser: 1.31 mg/dL — ABNORMAL HIGH (ref 0.4–1.2)
GFR calc Af Amer: 48 mL/min — ABNORMAL LOW (ref >60)
GFR calc non Af Amer: 40 mL/min — ABNORMAL LOW (ref >60)
Glucose, Bld: 180 mg/dL — ABNORMAL HIGH (ref 70–99)
Potassium: 3.4 mEq/L — ABNORMAL LOW (ref 3.5–5.1)
Sodium: 151 mEq/L — ABNORMAL HIGH (ref 135–145)
Total Bilirubin: 0.7 mg/dL (ref 0.3–1.2)
Total Protein: 5 g/dL — ABNORMAL LOW (ref 6.0–8.3)

## 2010-03-20 LAB — GLUCOSE, CAPILLARY
Glucose-Capillary: 144 mg/dL — ABNORMAL HIGH (ref 70–99)
Glucose-Capillary: 144 mg/dL — ABNORMAL HIGH (ref 70–99)
Glucose-Capillary: 150 mg/dL — ABNORMAL HIGH (ref 70–99)
Glucose-Capillary: 164 mg/dL — ABNORMAL HIGH (ref 70–99)
Glucose-Capillary: 230 mg/dL — ABNORMAL HIGH (ref 70–99)
Glucose-Capillary: 128 mg/dL — ABNORMAL HIGH (ref 70–99)
Glucose-Capillary: 135 mg/dL — ABNORMAL HIGH (ref 70–99)
Glucose-Capillary: 146 mg/dL — ABNORMAL HIGH (ref 70–99)
Glucose-Capillary: 157 mg/dL — ABNORMAL HIGH (ref 70–99)
Glucose-Capillary: 171 mg/dL — ABNORMAL HIGH (ref 70–99)
Glucose-Capillary: 186 mg/dL — ABNORMAL HIGH (ref 70–99)

## 2010-03-20 LAB — MAGNESIUM
Magnesium: 2.6 mg/dL — ABNORMAL HIGH (ref 1.5–2.5)
Magnesium: 2 mg/dL (ref 1.5–2.5)
Magnesium: 2.5 mg/dL (ref 1.5–2.5)

## 2010-03-20 LAB — BASIC METABOLIC PANEL
BUN: 24 mg/dL — ABNORMAL HIGH (ref 6–23)
BUN: 27 mg/dL — ABNORMAL HIGH (ref 6–23)
CO2: 22 mEq/L (ref 19–32)
CO2: 24 mEq/L (ref 19–32)
Calcium: 7.1 mg/dL — ABNORMAL LOW (ref 8.4–10.5)
Calcium: 8.1 mg/dL — ABNORMAL LOW (ref 8.4–10.5)
Chloride: 100 mEq/L (ref 96–112)
Chloride: 116 mEq/L — ABNORMAL HIGH (ref 96–112)
Creatinine, Ser: 1.09 mg/dL (ref 0.4–1.2)
Creatinine, Ser: 1.12 mg/dL (ref 0.4–1.2)
GFR calc Af Amer: 59 mL/min — ABNORMAL LOW (ref >60)
GFR calc Af Amer: 57 mL/min — ABNORMAL LOW (ref >60)
GFR calc non Af Amer: 49 mL/min — ABNORMAL LOW (ref >60)
GFR calc non Af Amer: 47 mL/min — ABNORMAL LOW (ref >60)
Glucose, Bld: 154 mg/dL — ABNORMAL HIGH (ref 70–99)
Potassium: 3.4 mEq/L — ABNORMAL LOW (ref 3.5–5.1)
Potassium: 3.9 mEq/L (ref 3.5–5.1)
Sodium: 126 mEq/L — ABNORMAL LOW (ref 135–145)
Sodium: 145 mEq/L (ref 135–145)

## 2010-03-20 LAB — GIARDIA/CRYPTOSPORIDIUM SCREEN(EIA)
Cryptosporidium Screen (EIA): NEGATIVE
Giardia Screen - EIA: NEGATIVE

## 2010-03-20 LAB — PHOSPHORUS
Phosphorus: 3.3 mg/dL (ref 2.3–4.6)
Phosphorus: 2 mg/dL — ABNORMAL LOW (ref 2.3–4.6)
Phosphorus: 2.4 mg/dL (ref 2.3–4.6)

## 2010-03-20 LAB — CLOSTRIDIUM DIFFICILE BY PCR: Toxigenic C. Difficile by PCR: NEGATIVE

## 2010-03-21 ENCOUNTER — Inpatient Hospital Stay (HOSPITAL_COMMUNITY)
Admission: RE | Admit: 2010-03-21 | Discharge: 2010-03-29 | Payer: Self-pay | Attending: Physical Medicine & Rehabilitation | Admitting: Physical Medicine & Rehabilitation

## 2010-03-25 LAB — BASIC METABOLIC PANEL
BUN: 22 mg/dL (ref 6–23)
CO2: 24 mEq/L (ref 19–32)
Calcium: 8.5 mg/dL (ref 8.4–10.5)
Calcium: 8.2 mg/dL — ABNORMAL LOW (ref 8.4–10.5)
Chloride: 110 mEq/L (ref 96–112)
Creatinine, Ser: 1.3 mg/dL — ABNORMAL HIGH (ref 0.4–1.2)
GFR calc non Af Amer: 42 mL/min — ABNORMAL LOW (ref >60)
Glucose, Bld: 171 mg/dL — ABNORMAL HIGH (ref 70–99)
Glucose, Bld: 148 mg/dL — ABNORMAL HIGH (ref 70–99)
Sodium: 139 mEq/L (ref 135–145)

## 2010-03-25 LAB — CBC
HCT: 29.1 % — ABNORMAL LOW (ref 36.0–46.0)
Hemoglobin: 9.5 g/dL — ABNORMAL LOW (ref 12.0–15.0)
MCH: 30.3 pg (ref 26.0–34.0)
MCHC: 32.6 g/dL (ref 30.0–36.0)
MCV: 92.7 fL (ref 78.0–100.0)
RBC: 3.32 MIL/uL — ABNORMAL LOW (ref 3.87–5.11)
WBC: 10.6 10*3/uL — ABNORMAL HIGH (ref 4.0–10.5)

## 2010-03-25 LAB — GLUCOSE, CAPILLARY
Glucose-Capillary: 131 mg/dL — ABNORMAL HIGH (ref 70–99)
Glucose-Capillary: 181 mg/dL — ABNORMAL HIGH (ref 70–99)
Glucose-Capillary: 136 mg/dL — ABNORMAL HIGH (ref 70–99)

## 2010-03-25 LAB — COMPREHENSIVE METABOLIC PANEL
ALT: 56 U/L — ABNORMAL HIGH (ref 0–35)
AST: 48 U/L — ABNORMAL HIGH (ref 0–37)
Albumin: 2.3 g/dL — ABNORMAL LOW (ref 3.5–5.2)
Alkaline Phosphatase: 59 U/L (ref 39–117)
Chloride: 107 mEq/L (ref 96–112)
GFR calc Af Amer: 47 mL/min — ABNORMAL LOW (ref >60)
Potassium: 3.9 mEq/L (ref 3.5–5.1)
Sodium: 141 mEq/L (ref 135–145)
Total Bilirubin: 0.5 mg/dL (ref 0.3–1.2)

## 2010-03-25 LAB — STOOL CULTURE

## 2010-03-25 LAB — DIFFERENTIAL
Basophils Absolute: 0.1 10*3/uL (ref 0.0–0.1)
Eosinophils Absolute: 0.3 10*3/uL (ref 0.0–0.7)
Lymphocytes Relative: 19 % (ref 12–46)
Monocytes Relative: 11 % (ref 3–12)
Neutrophils Relative %: 66 % (ref 43–77)

## 2010-03-25 LAB — PHOSPHORUS: Phosphorus: 2.9 mg/dL (ref 2.3–4.6)

## 2010-03-26 LAB — CULTURE, BLOOD (ROUTINE X 2)
Culture  Setup Time: 201201151140
Culture  Setup Time: 201201151140
Culture: NO GROWTH
Culture: NO GROWTH

## 2010-03-31 LAB — CONVERTED CEMR LAB
Basophils Relative: 0.4 % (ref 0.0–3.0)
Eosinophils Absolute: 0.1 10*3/uL (ref 0.0–0.7)
INR: 1.1 — ABNORMAL HIGH (ref 0.8–1.0)
MCHC: 33.9 g/dL (ref 30.0–36.0)
MCV: 92.6 fL (ref 78.0–100.0)
Monocytes Absolute: 0.8 10*3/uL (ref 0.1–1.0)
Neutrophils Relative %: 53.5 % (ref 43.0–77.0)
Platelets: 146 10*3/uL — ABNORMAL LOW (ref 150.0–400.0)
RBC: 4.48 M/uL (ref 3.87–5.11)

## 2010-04-02 NOTE — Discharge Summary (Signed)
[name], [name]             ACCOUNT NO.:  [id number]  MEDICAL RECORD NO.:  [id number]          PATIENT TYPE:  INP  LOCATION:  5150                         FACILITY:  [facility]  PHYSICIAN:  Leslye Peer, MD    DATE OF BIRTH:  Feb 12, 1933  DATE OF ADMISSION:  03/09/2010 DATE OF DISCHARGE:  03/20/2010                              DISCHARGE SUMMARY   FINAL DIAGNOSES: 1. Resolved acute respiratory failure due to community-acquired     pneumonia (no organism specified), further complicated by acute     respiratory distress syndrome, and element of cardiogenic pulmonary     edema. 2. History of systolic and diastolic heart failure (now compensated). 3. Resolved septic shock. 4. History of atrioventricular heart block. 5. Chronic renal insufficiency, now with mild elevated serum     creatinine. 6. Diarrhea (improved). 7. Dysphagia status post mechanical ventilation. 8. Anemia of critical illness. 9. Oropharyngeal candidiasis. 10.Debilitation and malnutrition. 11.Hypothyroidism. 12.Hyperglycemia.  CONSULTANT:  Armanda Magic, M.D. with Cardiology.  LABORATORY DATA:  March 20, 2010, white blood cell count 16, hemoglobin 9.5, hematocrit 29.1, platelet count 248.  Phosphorus 2.9, magnesium 2.3, sodium 143, potassium 3.5, chloride 110, CO2 of 24, glucose 171, BUN 22, creatinine 1.30.  ADDITIONAL STUDIES:  Chest x-ray, this was obtained March 18, 2010 demonstrates left lower lobe consolidation, otherwise improved aeration bilaterally.  She also underwent a diagnostic swallowing evaluation on March 18, 2009, this demonstrated mild-to-moderate sensory motor based oropharyngeal dysphagia with decreased swallowing, coordination, with respiratory efforts.  This was again completed on the 16th.  PROCEDURES:  Echocardiogram obtained on March 10, 2010 demonstrated EF estimated at 45% to 50%, wall motion normal.  Doppler parameters were consistent with abnormal left ventricular  relaxation/grade 1 diastolic dysfunction.  Endotracheal tube, this was placed on January 7, removed on January 12.  Right subclavian triple-lumen catheter placed on January 8, removed on January 17.  Left radial arterial line placed on January 8, removed January 8.  CULTURE DATA:  All negative with the exception of a stool culture obtained on January 15.  This did demonstrate abundant Candida albicans. All other microbiology cultures sent were negative.  BRIEF HISTORY:  This is a 77 year old female patient with a known history of nonischemic cardiomyopathy with a known EF of 35% to 40% by echocardiogram back in 2009, given her history of chronic systolic and diastolic heart failure.  She also has a history of complete heart block and she is status post permanent pacemaker in 2002.  Initially, this was replaced in May 2011, also carries a history of hypothyroidism, hyperlipidemia, and hypertension as well as prior recent partial colectomy.  She presented on March 10, 2010 with 2-3 days history of progressive dyspnea.  Apparently, she has Lasix at home, which she took up to 120 mg for her symptomatology without resolution of symptoms. Presented to the emergency room on hospital admit day in acute respiratory distress, hypotensive, and in acute renal failure with serum creatinine of 1.69.  Given these constellations of findings, and chest x- ray demonstrating bilateral pulmonary infiltrates, she was admitted to the pulmonary critical care service, which she did require intubation in  the emergency room, and she was initiated on early goal-directed therapy protocol, given the initial presenting lactate was 4.3, and a concern for probable pneumonia.  HOSPITAL COURSE BY DISCHARGE DIAGNOSES: 1. Resolved acute respiratory failure secondary to community-acquired     pneumonia (no organism specified).  Again as noted, [name]     reports in the [facility] Emergency Room in acute  respiratory     distress following a two-to-three day progressive dyspnea.  Initial     chest x-ray demonstrated bilateral pulmonary infiltrates, and she     demonstrated constellation of symptoms consistent with clinical     diagnosis of pneumonia as well.  Given her acute distress, she did     require intubation, and placement on mechanical ventilation.  She     developed progressive pulmonary infiltrates and therefore required     low tidal volume approach to the mechanical ventilation in the     setting of what appeared to be acute respiratory distress syndrome.     She was maintained on mechanical ventilation, IV sedation, and low-     tidal volume ventilation protocol, up until March 14, 2010, at     which time she was able to be successfully extubated from the     mechanical ventilator.  Please note following resolution of her     severe sepsis, she did require aggressive diuresis in order to     assist here in liberating her from the ventilator machine.  Upon     time of dictation, she is now status post complete course of     antibiotic therapy.  She had been treated initially with     vancomycin, and Zosyn.  These were used from January 7 to January     15.  Also treated with azithromycin from January 8 to January 12.     She did get treated for intermittent fever on January 15 through     January 17, with Primaxin, this has since been discontinued.  From     a pulmonary standpoint, [name] continues to improve.  Her most     previous chest x-ray did demonstrate some mild left lower lobe     atelectasis.  However, ultimately chest x-ray is clearing,     pulmonary symptoms have improved, and she slowly improving in an     overall sense of health.  At this point from pulmonary standpoint,     her recommendations will be to continue to work on weaning     supplemental oxygen, adjust diuretics to achieve euvolemia,     continue to push pulmonary hygiene and rehabilitation  efforts.  She     will again need followup chest x-ray at some point in the future to     further evaluate the progress of the left lower lobe atelectasis. 2. Severe sepsis, septic shock secondary to presumed community-     acquired pneumonia (no organism specified).  [name] was     admitted to the intensive care.  She was placed on early goal-     directed therapy protocol.  This included IV antibiotics,     aggressive IV fluid resuscitation, central access was central     venous guided resuscitation goals.  She also required brief     vasoactive drip support in the form of Levophed.  She was     eventually transitioned off from this.  She did have mild renal     insufficiency following her  initial presentation with shock this is     now resolved.  At this point of dictation, severe sepsis and septic     shock has been treated and is considered a resolved issue. 3. History of systolic and diastolic heart failure.  This is now     compensated.  It is felt that pulmonary edema and systolic     decompensation was a component in the findings of her pulmonary     infiltrates on chest x-ray.  Certainly, she has responded nicely to     diuretics during her hospitalization.  She has been followed by her     primary cardiologist, Dr. Mayford Knife.  They will continue to follow.     From a cardiology standpoint, we will continue to adjust her     diuretic regimen and continue to monitor for evidence of     decompensation from a heart failure standpoint. 4. History of AV block.  She does have a permanent pacemaker in place. 5. Chronic renal insufficiency.  She did present with an initial acute     renal failure picture with a serum creatinine of 1.69.  This     improved with IV fluid resuscitation.  In our efforts to maximize     her volume status, we have seen her serum creatinine slightly rise     with diuresis.  This was initially noted on January 16 at which     time her serum creatinine is  1.31.  Her diuresis was held for 1 day     and on January 17 her serum creatinine dropped to 1.12.  Given     concern about reestablishing optimal fluid balance, her diuresis     was resumed to b.i.d. on 18th and she since bumped to serum     creatinine to 1.3.  Given these findings, we have now readjusted     her diuretic regimen to be back to her typical 40 mg daily.  She     will need further evaluation of her fluid volume status, daily     weights, and close observation of her serum chemistry. 6. History of diarrhea.  Stool cultures were negative with the     exception of abundant Candida albicans isolated on the culture sent     on the 15th.  Please note that C. diff, PCR was sent and this was     negative.  At this point, diarrhea has resolved.  Please see     section below in regards to oropharyngeal candidiasis. 7. Dysphagia following intubation.  [name] did have a     postextubation evaluation of her swallowing function, this was done     on March 18, 2009.  She continued to improve rapidly over the     course of her hospitalization.  As noted above, she did have some     mild oropharyngeal type dysphagia by modified swallowing     evaluation, at this point she continues to improve.  We will go     ahead and order reevaluation by Speech Therapy to see if we can     advance her oral diet. 8. Anemia of critical illness.  At this point, no evidence of     bleeding.  We will continue current regimen as noted.  Transfusion     trigger would be for hemoglobin less than 7. 9. Hypothyroidism.  For this, she will continue her current regimen. 10.Hyperglycemia.  For this, she will continue sliding  scale insulin. 11.Debilitation following prolonged critical illness.  For this     physical therapy and occupational therapy will be working and     continued to work with [name].  Ultimately given her degree of     debilitation following a prolonged critical illness, it is felt      that she would most benefit by an intensive physical therapy     regimen.  We will go ahead and put in a consult for inpatient     rehab.  At this point, we will continue to adjust her maintenance     medication regimen as we continue focus on strengthening and     rehabilitation goals.  At this point, [name] has met maximum     benefit from critical care services.  She is now medically cleared     to be transferred to Internal Medicine Service who will assume her     care and dictate further medical decisions.  From pulmonary     standpoint, no specific followup required with the exception of a     routine chest x-ray to be sure left lower lobe atelectasis is     clearing.  [name] care will be transitioned to the [facility].  DISCHARGE INSTRUCTIONS:  Discharge diet, this will be advanced per results of modified barium swallow.  DISCHARGE MEDICATIONS:  These will be deferred until time of formal discharge by the medicine service.  The patient has met maximum benefit from inpatient critical care services.  Her care will now be assumed by the Medicine team.     Zenia Resides, NP   ______________________________ Leslye Peer, MD    PB/MEDQ  D:  03/20/2010  T:  03/20/2010  Job:  [id number]  Electronically Signed by Zenia Resides NP on 03/29/2010 02:22:05 PM Electronically Signed by [name] MD on 04/02/2010 04:56:39 PM

## 2010-04-03 ENCOUNTER — Encounter: Payer: Self-pay | Admitting: Family Medicine

## 2010-04-03 ENCOUNTER — Encounter: Payer: Self-pay | Admitting: Physical Medicine & Rehabilitation

## 2010-04-04 NOTE — Cardiovascular Report (Signed)
Summary: Office Visit   Office Visit   Imported By: Roderic Ovens 05/11/2009 10:53:47  _____________________________________________________________________  External Attachment:    Type:   Image     Comment:   External Document

## 2010-04-04 NOTE — Assessment & Plan Note (Signed)
Summary: [name] rescheduled out of town for christmas/sl   Visit Type:  Follow-up Referring Provider:  Dr.Edmunds/ Mendel Ryder Primary Provider:  Dr Ivory Broad   History of Present Illness: The patient presents today for routine electrophysiology followup. She reports doing very well since having her device pulse generator replaced and upgraded to a biv pacemaker. The patient denies symptoms of palpitations, chest pain, shortness of breath, orthopnea, PND, lower extremity edema, dizziness, presyncope, syncope, or neurologic sequela. The patient is tolerating medications without difficulties and is otherwise without complaint today.   Current Medications (verified): 1)  Folic Acid 1 Mg Tabs (Folic Acid) .Marland Kitchen.. 1 By Mouth Once Daily 2)  Singulair 10 Mg Tabs (Montelukast Sodium) .Marland Kitchen.. 1 By Mouth Once Daily 3)  Claritin 10 Mg Tabs (Loratadine) .... One By Mouth Two Times A Day 4)  Ramipril 2.5 Mg Caps (Ramipril) .... One By Mouth Two Times A Day 5)  Aspirin Ec 325 Mg Tbec (Aspirin) .... Take One Tablet By Mouth Daily 6)  Simvastatin 40 Mg Tabs (Simvastatin) .... Take One Tablet By Mouth Daily At Bedtime 7)  Premarin 0.3 Mg Tabs (Estrogens Conjugated) .... One By Mouth Every Other Day 8)  Pacerone 200 Mg Tabs (Amiodarone Hcl) .Marland Kitchen.. 1 By Mouth Once Daily 9)  Synthroid 75 Mcg Tabs (Levothyroxine Sodium) .... Take One Tablet By Mouth Once Daily. 10)  Neurontin 300 Mg Caps (Gabapentin) .... 2 By Mouth At Bedtime 11)  Triamcinolone Acetonide 0.5 % Crea (Triamcinolone Acetonide) .... As Needed 12)  Metronidazole 0.75 % Crea (Metronidazole) .... Two Times A Day As Needed 13)  Potassium 99 Mg Tabs (Potassium) .Marland Kitchen.. 1-2 Tabs Per Day 14)  Osteo Bi-Flex Joint Shield  Tabs (Misc Natural Products) .Marland Kitchen.. 1 By Mouth Once Daily 15)  Calcium Antacid 500 Mg Chew (Calcium Carbonate Antacid) .Marland Kitchen.. 1 By Mouth Once Daily 16)  Vitamin D3 1000 Unit Caps (Cholecalciferol) .Marland Kitchen.. 1 By Mouth Once Daily 17)  Furosemide 40 Mg Tabs  (Furosemide) .Marland Kitchen.. 1-2 Daily  Allergies: 1)  ! * Dapsone 2)  ! Carvedilol 3)  ! Metoprolol Succinate 4)  ! * Bystolic 5)  ! Beta Blockers 6)  ! Voltaren 7)  ! Daypro 8)  ! Methotrexate  Past History:  Past Medical History: Reviewed history from 11/09/2008 and no changes required. Nonischemic CM (EF 35-40%) No CAD by cath 2009 S/p PPM 2002 for complete heart block NYHA Class II/III CHF Persistent atrial fibrillation on amiodarone therapy.  Hypothyroidism.  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  Pacemaker 2002 with upgrade to biv pacemaker 9/11 Foot surgery Breast Bx- benign  Social History: Reviewed history from 11/09/2008 and no changes required. Lives in [place] with husband.  2 grown children.  Retired Catering manager.  Her husband teaches at [employer] and is in the process of retiring.  Tob- none, ETOH- none, Drugs- none  Review of Systems       All systems are reviewed and negative except as listed in the HPI.   Vital Signs:  Patient profile:   77 year old female Height:      63.5 inches Weight:      174 pounds BMI:     30.45 Pulse rate:   74 / minute BP sitting:   140 / 66  (left arm)  Vitals Entered By: Laurance Flatten CMA (March 23, 2009 9:16 AM)  Physical Exam  General:  Well developed, well nourished, in no acute distress. Head:  normocephalic and atraumatic Eyes:  PERRLA/EOM intact; conjunctiva and lids normal. Nose:  no deformity, discharge, inflammation, or lesions Mouth:  Teeth, gums and palate normal. Oral mucosa normal. Neck:  Neck supple, no JVD. No masses, thyromegaly or abnormal cervical nodes. Chest Wall:  pacemaker site is well healed Lungs:  Clear bilaterally to auscultation and percussion. Heart:  Non-displaced PMI, chest non-tender; regular rate and rhythm, S1, S2 without murmurs, rubs or gallops. Carotid upstroke normal, no bruit. Normal  abdominal aortic size, no bruits. Femorals normal pulses, no bruits. Pedals normal pulses. No edema, no varicosities. Abdomen:  Bowel sounds positive; abdomen soft and non-tender without masses, organomegaly, or hernias noted. No hepatosplenomegaly. Msk:  Back normal, normal gait. Muscle strength and tone normal. Pulses:  pulses normal in all 4 extremities Extremities:  No clubbing or cyanosis. Neurologic:  Alert and oriented x 3. Skin:  Intact without lesions or rashes. Psych:  Normal affect.   PPM Specifications Following MD:  Hillis Range, MD     PPM Vendor:  [employer]     PPM Model Number:  [id number]     PPM Serial Number:  [id number] PPM DOI:  11/16/2008     PPM Implanting MD:  Hillis Range, MD  Lead 1    Location: RA     DOI: 07/11/2000     Model #: [id number]     Serial #: [id number]     Status: active Lead 2    Location: RV     DOI: 07/11/2000     Model #: [id number]     Serial #: [id number]     Status: active Lead 3    Location: LV     DOI: 11/16/2008     Model #: [id number]     Serial #: [id number]     Status: active  Magnet Response Rate:  BOL 85 ERI 65  Indications:  CM   PPM Follow Up Remote Check?  No Battery Voltage:  2.981 V     Battery Est. Longevity:  3.5 years     Pacer Dependent:  Yes       PPM Device Measurements Atrium  Amplitude: 1.0 mV, Impedance: 434 ohms, Threshold: 1.0 V at 0.4 msec Right Ventricle  Impedance: 842 ohms, Threshold: 1.0 V at 0.4 msec Left Ventricle  Impedance: 738 ohms, Threshold: 3.0 V at 1.0 msec  Episodes MS Episodes:  508     Percent Mode Switch:  0%     Coumadin:  No Ventricular High Rate:  0     Atrial Pacing:  73.7%     Ventricular Pacing:  100%  Parameters Mode:  DDDR     Lower Rate Limit:  70     Upper Rate Limit:  130 Paced AV Delay:  170     Sensed AV Delay:  150 Next Cardiology Appt Due:  11/01/2009 Tech Comments:  LV reprogrammed  3.5@1 .5.  Longest mode switch 2:38 minutes, no coumadin, ventricular rates well controlled.    ROV 9/11 Dr.  Johney Frame. Altha Harm, LPN  March 23, 2009 9:35 AM  MD Comments:  agree with above  Impression & Recommendations:  Problem # 1:  ATRIOVENTRICULAR BLOCK, COMPLETE (ICD-426.0) Doing very well s/p device upgrade to biv pacemaker. Changes in device as above.  Her updated medication list for this problem includes:    Ramipril 2.5 Mg Caps (Ramipril) ..... One by mouth two times a day    Aspirin Ec 325 Mg Tbec (Aspirin) .Marland Kitchen... Take one tablet by mouth daily  Pacerone 200 Mg Tabs (Amiodarone hcl) .Marland Kitchen... 1/2 by mouth once daily  Problem # 2:  ATRIAL FIBRILLATION (ICD-427.31) No prolonged afib. Pt reluctant to consider coumadin. Will decrease amiodarone to 100mg  daily and consider stopping this medicine longterm. She will follow-up with Dr Katrinka Blazing for LFTs and TFTs.  Her updated medication list for this problem includes:    Aspirin Ec 325 Mg Tbec (Aspirin) .Marland Kitchen... Take one tablet by mouth daily    Pacerone 200 Mg Tabs (Amiodarone hcl) .Marland Kitchen... 1/2 by mouth once daily  Problem # 3:  OTHER PRIMARY CARDIOMYOPATHIES (ICD-425.4) CHF improved with biv pacing. no changes to medicine today  Her updated medication list for this problem includes:    Ramipril 2.5 Mg Caps (Ramipril) ..... One by mouth two times a day    Aspirin Ec 325 Mg Tbec (Aspirin) .Marland Kitchen... Take one tablet by mouth daily    Pacerone 200 Mg Tabs (Amiodarone hcl) .Marland Kitchen... 1/2 by mouth once daily    Furosemide 40 Mg Tabs (Furosemide) .Marland Kitchen... 1-2 daily  Problem # 4:  ESSENTIAL HYPERTENSION, BENIGN (ICD-401.1) above goal salt restriction advised consider increasing ramipril Dr Katrinka Blazing to follow  Her updated medication list for this problem includes:    Ramipril 2.5 Mg Caps (Ramipril) ..... One by mouth two times a day    Aspirin Ec 325 Mg Tbec (Aspirin) .Marland Kitchen... Take one tablet by mouth daily    Furosemide 40 Mg Tabs (Furosemide) .Marland Kitchen... 1-2 daily  Patient Instructions: 1)  Your physician recommends that you schedule a follow-up appointment in:  11/2009 with Dr Johney Frame 2)  Your physician has recommended you make the following change in your medication: decrease Amiodarone to 100mg  daily 3)  Contine to follow closely with Dr Katrinka Blazing.

## 2010-04-04 NOTE — Assessment & Plan Note (Signed)
Summary: 4wk f/u sl   Visit Type:  Follow-up Referring Provider:  Dr.Edmunds/ Mendel Ryder Primary Provider:  Dr Ivory Broad   History of Present Illness: The patient presents today for routine electrophysiology followup.  She reports that her fatigue has improved since her last visit.  She continues to have mild SOB. The patient denies symptoms of palpitations, chest pain, orthopnea, PND, lower extremity edema, dizziness, presyncope, syncope, or neurologic sequela. The patient is tolerating medications without difficulties and is otherwise without complaint today.   Current Medications (verified): 1)  Folic Acid 1 Mg Tabs (Folic Acid) .Marland Kitchen.. 1 By Mouth Once Daily 2)  Singulair 10 Mg Tabs (Montelukast Sodium) .Marland Kitchen.. 1 By Mouth Once Daily 3)  Claritin 10 Mg Tabs (Loratadine) .... One By Mouth Two Times A Day 4)  Ramipril 2.5 Mg Caps (Ramipril) .... One By Mouth Two Times A Day 5)  Aspirin Ec 325 Mg Tbec (Aspirin) .... Take One Tablet By Mouth Daily 6)  Simvastatin 40 Mg Tabs (Simvastatin) .... Take One Tablet By Mouth Daily At Bedtime 7)  Premarin 0.3 Mg Tabs (Estrogens Conjugated) .... One By Mouth Every Other Day 8)  Pacerone 200 Mg Tabs (Amiodarone Hcl) .Marland Kitchen.. 1 Tablet By Mouth Once Daily 9)  Synthroid 75 Mcg Tabs (Levothyroxine Sodium) .... Take One Tablet By Mouth Once Daily. 10)  Triamcinolone Acetonide 0.5 % Crea (Triamcinolone Acetonide) .... As Needed 11)  Metronidazole 0.75 % Crea (Metronidazole) .... Two Times A Day As Needed 12)  Potassium 99 Mg Tabs (Potassium) .Marland Kitchen.. 1-2 Tabs Per Day 13)  Osteo Bi-Flex Joint Shield  Tabs (Misc Natural Products) .Marland Kitchen.. 1 By Mouth Once Daily 14)  Calcium Antacid 500 Mg Chew (Calcium Carbonate Antacid) .Marland Kitchen.. 1 By Mouth Once Daily 15)  Vitamin D3 1000 Unit Caps (Cholecalciferol) .Marland Kitchen.. 1 By Mouth Once Daily 16)  Furosemide 40 Mg Tabs (Furosemide) .Marland Kitchen.. 1-3 Tabs Daily 17)  Finasteride 5 Mg Tabs (Finasteride) .... 1/2 Tablet Once Daily 18)  Vitamin C 500 Mg  Tabs  (Ascorbic Acid) .... Once Daily  Allergies: 1)  ! * Dapsone 2)  ! Carvedilol 3)  ! Metoprolol Succinate 4)  ! * Bystolic 5)  ! Beta Blockers 6)  ! Voltaren 7)  ! Daypro 8)  ! Methotrexate  Past History:  Past Medical History: Reviewed history from 11/09/2008 and no changes required. Nonischemic CM (EF 35-40%) No CAD by cath 2009 S/p PPM 2002 for complete heart block NYHA Class II/III CHF Persistent atrial fibrillation on amiodarone therapy.  Hypothyroidism.  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  Hyperlipidemia HTN  Past Surgical History: Reviewed history from 03/23/2009 and no changes required. 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  Pacemaker 2002 with upgrade to biv pacemaker 9/11 Foot surgery Breast Bx- benign  Social History: Reviewed history from 11/09/2008 and no changes required. Lives in [place] with husband.  2 grown children.  Retired Catering manager.  Her husband teaches at [employer] and is in the process of retiring.  Tob- none, ETOH- none, Drugs- none  Vital Signs:  Patient profile:   77 year old female Height:      63.5 inches Weight:      169 pounds BMI:     29.57 Pulse rate:   75 / minute BP sitting:   140 / 80  (left arm)  Vitals Entered By: Laurance Flatten CMA (May 23, 2009 1:56 PM)  Physical Exam  General:  Well developed, well nourished, in no acute distress. Head:  normocephalic and atraumatic Eyes:  PERRLA/EOM  intact; conjunctiva and lids normal. Mouth:  Teeth, gums and palate normal. Oral mucosa normal. Neck:  Neck supple, no JVD. No masses, thyromegaly or abnormal cervical nodes. Chest Wall:  pacemaker site is well healed Lungs:  Clear bilaterally to auscultation and percussion. Heart:  Non-displaced PMI, chest non-tender; regular rate and rhythm, S1, S2 without murmurs, rubs or gallops. Carotid upstroke normal, no bruit. Normal abdominal aortic size, no bruits. Femorals normal pulses, no bruits. Pedals normal pulses. No  edema, no varicosities. Abdomen:  Bowel sounds positive; abdomen soft and non-tender without masses, organomegaly, or hernias noted. No hepatosplenomegaly. Msk:  Back normal, normal gait. Muscle strength and tone normal. Pulses:  pulses normal in all 4 extremities Extremities:  No clubbing or cyanosis. Neurologic:  Alert and oriented x 3. Skin:  Intact without lesions or rashes. Psych:  very anxious   PPM Specifications Following MD:  Inactive     Referring MD:  Jarvis Morgan Vendor:  Medtronic     PPM Model Number:  [id number]     PPM Serial Number:  [id number] PPM DOI:  11/16/2008     PPM Implanting MD:  Hillis Range, MD  Lead 1    Location: RA     DOI: 07/11/2000     Model #: [id number]     Serial #: [id number]     Status: active Lead 2    Location: RV     DOI: 07/11/2000     Model #: [id number]     Serial #: [id number]     Status: active Lead 3    Location: LV     DOI: 11/16/2008     Model #: 8315     Serial #: [id number]     Status: active  Magnet Response Rate:  BOL 85 ERI 65  Indications:  CM   PPM Follow Up Remote Check?  No Battery Voltage:  2.978 V     Battery Est. Longevity:  3.5 YEARS     Pacer Dependent:  Yes       PPM Device Measurements Atrium  Amplitude: 1.4 mV, Impedance: 453 ohms, Threshold: 0.5 V at 0.4 msec Right Ventricle  Amplitude: PACED AT 30 mV, Impedance: 862 ohms, Threshold: 1.0 V at 0.4 msec Left Ventricle  Impedance: 767 ohms, Threshold: 3.0 V at 0.4 msec Configuration: LV TIP TO RV RING  Episodes MS Episodes:  64     Percent Mode Switch:  0%     Coumadin:  No Ventricular High Rate:  0     Atrial Pacing:  79.8%     Ventricular Pacing:  100%  Parameters Mode:  DDDR     Lower Rate Limit:  70     Upper Rate Limit:  130 Paced AV Delay:  170     Sensed AV Delay:  150 Tech Comments:  Normal device function.  No changes made today.  LV programmed at 5V at 0.17msec per Dr Johney Frame, see previous notes.  ROV as needed.  Pt to follow with Dr Katrinka Blazing at [facility]. Gypsy Balsam RN BSN   May 23, 2009 2:16 PM  MD Comments:  agree  Impression & Recommendations:  Problem # 1:  ATRIOVENTRICULAR BLOCK, COMPLETE (ICD-426.0) Stable BiV pacemaker function. No changes today  Problem # 2:  ESSENTIAL HYPERTENSION, BENIGN (ICD-401.1) stable  no changes  Problem # 3:  ATRIAL FIBRILLATION (ICD-427.31) maintaining sinus rhythm  Patient Instructions: 1)  Your physician recommends that you schedule a follow-up appointment as needed

## 2010-04-04 NOTE — Cardiovascular Report (Signed)
Summary: Office Visit   Office Visit   Imported By: Roderic Ovens 03/28/2009 14:04:23  _____________________________________________________________________  External Attachment:    Type:   Image     Comment:   External Document

## 2010-04-04 NOTE — Assessment & Plan Note (Signed)
Summary: pc2/pacer adjustment   Visit Type:  Follow-up Referring Provider:  Dr.Edmunds/ Mendel Ryder Primary Provider:  Dr Ivory Broad  CC:  shortness of breath/Tiredness.  History of Present Illness: The patient presents today for routine electrophysiology followup.  She reports having fatigue and decreased energy since her LV lead pacing output was decreased last visit.  She also reports mild SOB. The patient denies symptoms of palpitations, chest pain, orthopnea, PND, lower extremity edema, dizziness, presyncope, syncope, or neurologic sequela. The patient is tolerating medications without difficulties and is otherwise without complaint today.   Current Medications (verified): 1)  Folic Acid 1 Mg Tabs (Folic Acid) .Marland Kitchen.. 1 By Mouth Once Daily 2)  Singulair 10 Mg Tabs (Montelukast Sodium) .Marland Kitchen.. 1 By Mouth Once Daily 3)  Claritin 10 Mg Tabs (Loratadine) .... One By Mouth Two Times A Day 4)  Ramipril 2.5 Mg Caps (Ramipril) .... One By Mouth Two Times A Day 5)  Aspirin Ec 325 Mg Tbec (Aspirin) .... Take One Tablet By Mouth Daily 6)  Simvastatin 40 Mg Tabs (Simvastatin) .... Take One Tablet By Mouth Daily At Bedtime 7)  Premarin 0.3 Mg Tabs (Estrogens Conjugated) .... One By Mouth Every Other Day 8)  Pacerone 200 Mg Tabs (Amiodarone Hcl) .Marland Kitchen.. 1 Tablet By Mouth Once Daily 9)  Synthroid 75 Mcg Tabs (Levothyroxine Sodium) .... Take One Tablet By Mouth Once Daily. 10)  Neurontin 100 Mg Caps (Gabapentin) .... 2 Capsules At Bedtime 11)  Triamcinolone Acetonide 0.5 % Crea (Triamcinolone Acetonide) .... As Needed 12)  Metronidazole 0.75 % Crea (Metronidazole) .... Two Times A Day As Needed 13)  Potassium 99 Mg Tabs (Potassium) .Marland Kitchen.. 1-2 Tabs Per Day 14)  Osteo Bi-Flex Joint Shield  Tabs (Misc Natural Products) .Marland Kitchen.. 1 By Mouth Once Daily 15)  Calcium Antacid 500 Mg Chew (Calcium Carbonate Antacid) .Marland Kitchen.. 1 By Mouth Once Daily 16)  Vitamin D3 1000 Unit Caps (Cholecalciferol) .Marland Kitchen.. 1 By Mouth Once Daily 17)   Furosemide 40 Mg Tabs (Furosemide) .Marland Kitchen.. 1-3 Tabs Daily 18)  Finasteride 5 Mg Tabs (Finasteride) .... 1/2 Tablet Once Daily 19)  Vitamin C 500 Mg  Tabs (Ascorbic Acid) .... Once Daily  Allergies: 1)  ! * Dapsone 2)  ! Carvedilol 3)  ! Metoprolol Succinate 4)  ! * Bystolic 5)  ! Beta Blockers 6)  ! Voltaren 7)  ! Daypro 8)  ! Methotrexate  Past History:  Past Medical History: Reviewed history from 11/09/2008 and no changes required. Nonischemic CM (EF 35-40%) No CAD by cath 2009 S/p PPM 2002 for complete heart block NYHA Class II/III CHF Persistent atrial fibrillation on amiodarone therapy.  Hypothyroidism.  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  Hyperlipidemia HTN  Past Surgical History: Reviewed history from 03/23/2009 and no changes required. 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  Pacemaker 2002 with upgrade to biv pacemaker 9/11 Foot surgery Breast Bx- benign  Social History: Reviewed history from 11/09/2008 and no changes required. Lives in [place] with husband.  2 grown children.  Retired Catering manager.  Her husband teaches at [employer] and is in the process of retiring.  Tob- none, ETOH- none, Drugs- none  Review of Systems       All systems are reviewed and negative except as listed in the HPI.   Vital Signs:  Patient profile:   77 year old female Height:      63.5 inches Weight:      169 pounds BMI:     29.57 Pulse rate:   75 / minute  BP sitting:   140 / 62  (left arm)  Vitals Entered By: Laurance Flatten CMA (April 25, 2009 3:45 PM)  Physical Exam  General:  Well developed, well nourished, in no acute distress. Head:  normocephalic and atraumatic Eyes:  PERRLA/EOM intact; conjunctiva and lids normal. Mouth:  Teeth, gums and palate normal. Oral mucosa normal. Neck:  Neck supple, no JVD. No masses, thyromegaly or abnormal cervical nodes. Chest Wall:  pacemaker site is well healed Lungs:  Clear bilaterally to auscultation and  percussion. Heart:  Non-displaced PMI, chest non-tender; regular rate and rhythm, S1, S2 without murmurs, rubs or gallops. Carotid upstroke normal, no bruit. Normal abdominal aortic size, no bruits. Femorals normal pulses, no bruits. Pedals normal pulses. No edema, no varicosities. Abdomen:  Bowel sounds positive; abdomen soft and non-tender without masses, organomegaly, or hernias noted. No hepatosplenomegaly. Msk:  Back normal, normal gait. Muscle strength and tone normal. Pulses:  pulses normal in all 4 extremities Extremities:  No clubbing or cyanosis. Neurologic:  Alert and oriented x 3. Skin:  Intact without lesions or rashes. Cervical Nodes:  no significant adenopathy Psych:  Normal affect.   PPM Specifications Following MD:  Hillis Range, MD     PPM Vendor:  [employer]     PPM Model Number:  [id number]     PPM Serial Number:  [id number] PPM DOI:  11/16/2008     PPM Implanting MD:  Hillis Range, MD  Lead 1    Location: RA     DOI: 07/11/2000     Model #: [id number]     Serial #: [id number]     Status: active Lead 2    Location: RV     DOI: 07/11/2000     Model #: [id number]     Serial #: [id number]     Status: active Lead 3    Location: LV     DOI: 11/16/2008     Model #: [id number]     Serial #: [id number]     Status: active  Magnet Response Rate:  BOL 85 ERI 65  Indications:  CM   PPM Follow Up Remote Check?  No Battery Voltage:  2.969 V     Battery Est. Longevity:  3 years     Pacer Dependent:  Yes       PPM Device Measurements Atrium  Amplitude: 1.0 mV, Impedance: 447 ohms, Threshold: 0.5 V at 0.4 msec Right Ventricle  Impedance: 858 ohms, Threshold: 1.0 V at 0.4 msec Left Ventricle  Impedance: 863 ohms, Threshold: 3.0 V at 0.4 msec  Episodes MS Episodes:  14     Coumadin:  No Ventricular High Rate:  0     Atrial Pacing:  75.4%     Ventricular Pacing:  100%  Parameters Mode:  DDDR     Lower Rate Limit:  70     Upper Rate Limit:  130 Paced AV Delay:  170     Sensed AV Delay:  150 Next  Cardiology Appt Due:  05/01/2009 Tech Comments:  LV output 5.0@0 .4 per patient request.  Longest ATR 4:46minutes.  ROV 4 weeks Dr. Johney Frame. Altha Harm, LPN  April 25, 2009 4:24 PM  MD Comments:  Pts pacing output was returned to prior settings per her request.  I cannot explain how with adequate pacing safety margins she could have become symptomatic with our prior changes.  She understands that with pacing at 5V@0 .4ms her battery may drain slightly quicker.  She is still adamant about returning her  pacing output to previous settings.  No other changes today.  Impression & Recommendations:  Problem # 1:  ATRIOVENTRICULAR BLOCK, COMPLETE (ICD-426.0) Normal BiV pacemaker function. Device returned to previous settings as above due to sypmtoms of fatigue with recent changes in LV output. No other changes today.  Problem # 2:  ATRIAL FIBRILLATION (ICD-427.31) Well controlled no changes today  Problem # 3:  ESSENTIAL HYPERTENSION, BENIGN (ICD-401.1) stable Her updated medication list for this problem includes:    Ramipril 2.5 Mg Caps (Ramipril) ..... One by mouth two times a day    Aspirin Ec 325 Mg Tbec (Aspirin) .Marland Kitchen... Take one tablet by mouth daily    Furosemide 40 Mg Tabs (Furosemide) .Marland Kitchen... 1-3 tabs daily  Patient Instructions: 1)  Your physician recommends that you schedule a follow-up appointment in: 4 weeks 2)  She will also follow closely with Dr Katrinka Blazing. 3)  Hopefully, if she continues to tolerate pacing with recent changes, I can return her device care back to [facility], though I am happy to help in any way.

## 2010-04-12 NOTE — H&P (Signed)
NAMECHRISTEAN, [name]             ACCOUNT NO.:  [id number]  MEDICAL RECORD NO.:  [id number]          PATIENT TYPE:  IPS  LOCATION:  4029                         FACILITY:  [facility]  PHYSICIAN:  [name], M.D.DATE OF BIRTH:  [birth year]  DATE OF ADMISSION:  03/21/2010 DATE OF DISCHARGE:                             HISTORY & PHYSICAL   REASON FOR ADMISSION:  Deconditioning after pneumonia.  HISTORY OF PRESENT ILLNESS:  This is a 77 year old female with ischemic cardiomyopathy as well as heart block status post permanent pacemaker was admitted on March 10, 2010 with acute respiratory failure and cough.  She was intubated in the emergency department due to respiratory distress and required pressors secondary to hypotension.  She was started on IV antibiotics and Levophed for septic shock.  She was also noted to have CHF and Cardiology recommended diuretics.  Critical Care Medicine felt the patient had a multifactorial respiratory failure due to pneumonia, ARDS, and CHF.  She did develop ischemic left hand.  Vein and Vascular Surgery was consulted and recommendation was to switch from Levophed to dopamine and avoid blood draws in the left hand.  Her symptoms have subsequently improved.  The patient was extubated on March 16, 2009.  Modified barium swallow was done showing evidence of aspiration of thin liquids.  There was a sensory motor oropharyngeal dysphagia and she was changed to a D3 nectar liquid diet.  She was also noted to have oral candidiasis of her mouth and diarrhea.  She was started on Diflucan.  PT/OT were also initiated. The patient was felt potential for inpatient rehab and therefore Physical Medicine Rehabilitation was consulted and felt to be good rehab candidate on March 20, 2010.  REVIEW OF SYSTEMS:  Positive weakness and also complains of a cough, mainly when she gets tired or moves around a bit.  PAST MEDICAL HISTORY: 1. Hypertension. 2.  Ischemic cardiomyopathy. 3. Incomplete heart block. 4. Permanent pacemaker, September 2010. 5. CHF. 6. Dyslipidemia. 7. Hemicolectomy secondary to villous adenoma. 8. Colonic polyps. 9. History of AFib.  FAMILY HISTORY:  CVA.  SOCIAL HISTORY:  Married, lives in 1-level home, 3 steps to enter.  No tobacco.  No EtOH.  Retired Engineer, civil (consulting).  Husband is supportive.  FUNCTIONAL HISTORY:  Independent without an assisted device prior to admission and was driving prior to admission.  HOME MEDICATIONS:  Synthroid, Claritin, finasteride, Altace, Pacerone, MetroGel, Premarin, Singulair, Lasix, Aldactone, aspirin, and calcium.  ALLERGIES: 1. DAYPRO. 2. VOLTAREN. 3. ADHESIVES.  IMAGING:  Last chest x-ray on March 18, 2010 showed bilateral lower lobe atelectasis, consolidation, left pleural effusion, perihilar opacities but no changes compared to prior on March 09, 2010.  Last hemoglobin 9.5 on March 20, 2010 with a white count of 16 and a platelet count of 248,000.  On March 21, 2010, she had a BUN 20, creatinine 1.25, sodium 139, and potassium 3.9.  PHYSICAL EXAMINATION:  VITAL SIGNS:  Blood pressure 110/52, pulse 79, respirations 16, and temperature 97.4. GENERAL:  This is a weak, ill-appearing female in no acute distress. EYES:  Anicteric, noninjected. EXTERNAL ENT:  Normal.  Oropharynx has some coating on her tongue. NECK:  Supple without adenopathy.  She had a IJ site with a dressing on. I removed the dressing.  This is well-healed, no dressing needed. RESPIRATORY:  Decreased breath sounds, bilateral bases.  Normal effort. No evidence of respiratory distress. HEART:  Regular rate and rhythm.  No rubs, murmurs, or extra sounds. ABDOMEN:  Positive bowel sounds, soft, and nontender to palpation. EXTREMITIES:  Has 1+ edema left hand, decreased range of motion. NEUROLOGIC:  Judgment, orientation, memory, affect are intact. Sensation is normal in the upper and lower extremities.   Cranial nerves II-XII are normal.  Deep tendon reflexes are normal. SKIN:  Discoloration in dorsum of the hand, 1+ edema dorsum of the hand, and mildly decreased range of motion and finger flexion on the left hand only due to swelling.  POST ADMISSION PHYSICIAN EVALUATION: 1. Functional deficits secondary to deconditioning associated with     acute respiratory failure. 2. The patient is admitted to receive collaborative interdisciplinary     care between the physiatrist, rehab nursing staff, and therapy     team. 3. The patient's level medical complexity and substantial therapy     needs in context of that medical necessity cannot be provided at a     lesser intensity of care such as SNF. 4. The patient has experienced substantial functional loss from her     baseline.  Upon functional assessment at the time of preadmission     screening, the patient was at min guard assist for transfers, min     guard assist ambulation 100 feet with a rolling walker.  OT found     the patient to be mod assist upper body care, max assist lower body     care.  Upon functional evaluation today, the patient was at the     same levels.  Judging by the patient's diagnosis, physical exam,     and functional history, the patient has a potential for functional     progress which will result in measurable gains while in inpatient     rehab.  These gains will be of substantial and practical use upon     discharge to home in facilitating mobility, self-care independence.     Interim changes in medical status since preadmission screening are     detailed in the history of present illness. 5. Staff nurse will provide 24-hour management of medical needs as     well as oversight of therapy plan/treatment and provide guidance as     appropriate regarding interactions of two. 6. 24-hour rehab nursing will assess in management of skin, bowel,     bladder, pain and help integrate therapy concepts, techniques, and      education. 7. PT will assess and treat for pre-gait training, gait training,     endurance, equipment, and goals are for modified independent level     with all mobility. 8. OT will assess and treat for ADLs, safety, endurance, equipment,     and goals are for supervision upper body and min assist lower body     ADLs. 9. Speech and Language Pathology will assess and treat for dysphagia.     Goals are for meeting 100% p.o. caloric and fluid needs with a     normal diet. 10.Case Management and Social Work will assess and treat for     psychosocial issues and discharge planning. 11.Team conference will be held weekly to assess the patient's     progress/goals and to determine barriers to discharge. 12.The patient  has demonstrated sufficient medical stability and     exercise capacity to tolerate at least 3 hours of therapy per day     at least 5 days per week. 13.Estimated length of stay is 7-10 days.  Prognosis for further     functional improvement is good.  MEDICAL PROBLEM LIST AND PLAN: 1. Congestive heart failure compensated every other day weights.     Continue Aldactone, Lasix.  Monitor Is and Os. 2. Candidal infection, Diflucan days 2-7. 3. Hypothyroidism, on thyroid supplements. 4. Hypoxia.  Nebs q.i.d. encouraged.  Incentive spirometry q.1 hour     when awake.  O2 wean as tolerated. 5. Left hand edema.  Continue elevation, icing, and movement.  OT may     assist with edema reduction and retrograde massage. 6. Hypertension.  Continue current medications and adjust if needed. 7. Cardiac arrhythmia.  Continue amiodarone.  She does have a pacer.     No signs of pacer dysfunction.  Motivation and mood appear to be adequate for full participation in inpatient rehabilitation program.  Discussed rehab medicine services with the patient and family including her husband and her sister and answered questions.     [name], M.D.     AEK/MEDQ  D:  03/21/2010  T:   03/22/2010  Job:  [id number]  cc:   Pasty Spillers. Ivory Broad, M.D. [name], M.D. Armanda Magic, M.D. Larina Earthly, M.D.  Electronically Signed by Claudette Laws M.D. on 04/12/2010 25:95:63 PM

## 2010-04-12 NOTE — Discharge Summary (Signed)
[name]             ACCOUNT NO.:  [id number]  MEDICAL RECORD NO.:  [id number]          [name] TYPE:  IPS  LOCATION:  4029                         FACILITY:  [facility]  PHYSICIAN:  Erick Colace, M.D.DATE OF BIRTH:  06/22/1932  DATE OF ADMISSION:  03/21/2010 DATE OF DISCHARGE:  03/29/2010                              DISCHARGE SUMMARY   DISCHARGE DIAGNOSES: 1. Deconditioning due to acute respiratory failure. 2. Congestive heart failure compensated. 3. Candidal stomatitis as well as diarrhea due to abundant Candida,     resolving. 4. Hypoxia, resolving. 5. Hypothyroid.  HISTORY OF PRESENT ILLNESS:  [name] is a 77 year old female with history of ischemic cardiomyopathy, heart block with permanent pacemaker admitted on March 10, 2010 with acute respiratory failure with cough. The [name] intubated in ED due to respiratory distress and required pressors secondary to hypertension.  She was started on IV antibiotics as well as Levophed for septic shock.  She was also noted to have CHF and Cardiology recommended diuretics as tolerated with blood pressure. Critical Care Medicine feel that the [name] has multifactorial respiratory failure due to pneumonia, ARDS, and CHF.  The [name] did well develop ischemia left hand and vascular vein specialist consulted for input.  They recommended changing the [name]'s dopamine and avoiding blood draws in left hand.  Ischemic changes have resolved.  The [name] extubated on March 16, 2010 and swallow eval done showed evidence of aspiration.  Therefore, the [name] on D3 diet nectar liquids.  The [name] is noted to have oral candida with sore mouth as well as diarrhea due to decrease in normal flora and stools positive for Candida.  She was started on Diflucan 24 hours ago.  Therapies were initiated and the [name] is noted to be deconditioned with initial posterior lean as well as cues for rolling walker safety.  She  was evaluated by Rehab and felt that she would benefit from a CIR program.  PAST MEDICAL HISTORY: 1. Hypertension. 2. Ischemic cardiomyopathy. 3. Complete heart block with permanent pacemaker in September 2010. 4. CHF. 5. Dyslipidemia. 6. Hemicolectomy due to villous adenoma, colon polyps, and     hemorrhoids. 7. History of AFib.  ALLERGIES: 1. DAYPRO. 2. VOLTAREN. 3. ADHESIVES.  FAMILY HISTORY:  Positive for CVA.  SOCIAL HISTORY:  The [name] is married, lives in a 1-level home with 3 steps at entry.  Does not use any tobacco or alcohol.  She is a retired Engineer, civil (consulting).  Husband is supportive and can assist as needed past discharge.  FUNCTIONAL STATUS:  The [name] was independent without assisted device prior to admission.  Still drives.  PHYSICAL EXAMINATION:  VITAL SIGNS:  Blood pressure 110/52, pulse 79, respirations 16, and temperature 97.4. GENERAL:  The [name] is a weak, ill-appearing female in no acute distress. HEENT:  Eyes anicteric, noninjected.  Oral mucosa with some coating on tongue and beefy looking.  Nares patent.  Hearing intact. NECK:  Supple without JVD or lymphadenopathy. LUNGS:  Decreased breath sounds at bases.  No respiratory distress. HEART:  Regular rate and rhythm without murmurs or gallops. ABDOMEN:  Soft, nontender with positive bowel sounds. EXTREMITIES:  1+  edema in left hand with decreased range of motion. SKIN:  Discoloration dorsum of the left hand with decrease in finger flexion and extension due to swelling. NEUROLOGIC:  Judgment, orientation, memory, mood are intact.  Sensation is normal in upper and lower extremities.  DTRs normal.  Cranial nerves II-XII normal.  HOSPITAL COURSE:  [name] was admitted to Rehab on March 21, 2010 for inpatient therapies to consist of PT, OT at least 3 hours 5 days a week.  Past admission, physiatrist, rehab RN, and therapy team have worked together to provide customized  collaborative interdisciplinary care.  The [name] was maintained on O2 initially.  As endurance improved, she has been weaned off oxygen without any evidence of hypoxia.  The [name] was maintained on Diflucan for 7 days to treat her odynophagia and candidiasis.  Labs were done past admission showing leukocytosis to be resolving with white count at 10.6.  H and H at 9.8 and 31.0.  A check of lytes revealed sodium 141, potassium 3.9, chloride 107, CO2 26, BUN 23, creatinine 1.33, and glucose 144.  Mild elevation in LFTs noted with AST 48, ALT 56, and T-bili at 0.5.  The [name]'s blood pressures have been checked on b.i.d. basis and these have ranged from 100-120 systolics and 60s-70s diastolic.  Heart rate has been stable in 70s-80s range.  Every other day weights were done to monitor for stability and these are noted to be on a downward trend.  A repeat swallow study was done on March 26, 2010 and the [name] was to advanced to regular diet thin liquids.  During the [name]'s stay in Rehab, weekly team conferences were held to monitor the [name]'s progress, set goals as well as discuss barriers to discharge.  At the time of admission, the [name] was noted be impaired by diffuse weakness, decrease in activity tolerance as well as decrease in balance.  The [name] was overall min assist for stand pivot transfers and min assist ambulating 50 feet.  OT has worked with the [name] on endurance as well as therapeutic exercises to improve range of motion of left hand.  Currently, the [name] is modified independent for bathing and dressing.  She is modified independent for transfers, modified independent for ambulating household distances with use of rolling walker.  Speech Therapy has been working with the [name] on dysphonia with diaphragmatic breathing to help improve breath support as well as vocal exercises.  Currently, the [name] is modified independent to perform these exercises.  She is tolerating regular diet without difficulty.  Further followup home health PT/OT to continue past discharge. [name] and husband have been educated regarding curren  progress and anticipated needs past discharge. On March 29, 2010, the [name] is discharged to home.  DISCHARGE MEDICATIONS: 1. Lasix 40 mg a day. 2. Aldactone 25 mg a day. 3. Synthroid 75 mcg a day. 4. Amiodarone 200 mg a day. 5. Protonix 40 mg at bedtime. 6. Flonase 2 squirts each nostril b.i.d. 7. Anusol HC cream to rectum b.i.d. p.r.n. 8. Advair 100/50 one puff b.i.d. 9. Ensure supplements t.i.d. if p.o. intake is poor.  DIET:  Heart-healthy.  Low salt diet.  ACTIVITY LEVEL:  Intermittent supervision, use walker for ambulations.  SPECIAL INSTRUCTIONS:  No alcohol, no smoking, and no driving.  Check weights daily and contact Dr. Katrinka Blazing if it goes up by 3 to 5 pounds in  two days.    FOLLOWUP:  The [name] to follow up with Dr. Wynn Banker as needed. Follow  up with Dr. Ivory Broad for routine check as well as check of CBC and liver function tests in the next couple of week to monitor for improvement.  Follow up with Dr. Verdis Prime in two weeks for routine check.     Delle Reining, P.A.   ______________________________ Erick Colace, M.D.    PL/MEDQ  D:  03/28/2010  T:  03/29/2010  Job:  [id number]  cc:   Nolon Nations, M.D. [name], M.D. Critical Care Medicine  Electronically Signed by Osvaldo Shipper. on 03/29/2010 03:54:44 PM Electronically Signed by Claudette Laws M.D. on 04/12/2010 04:23:14 PM

## 2010-04-30 ENCOUNTER — Ambulatory Visit
Admission: RE | Admit: 2010-04-30 | Discharge: 2010-04-30 | Disposition: A | Discharge status: Home / Self Care | Payer: Medicare Other | Source: Ambulatory Visit | Attending: Family Medicine | Admitting: Family Medicine

## 2010-04-30 ENCOUNTER — Other Ambulatory Visit: Payer: Self-pay | Admitting: Family Medicine

## 2010-04-30 DIAGNOSIS — J189 Pneumonia, unspecified organism: Secondary | ICD-10-CM

## 2010-05-09 ENCOUNTER — Other Ambulatory Visit: Payer: Self-pay | Admitting: Family Medicine

## 2010-05-09 DIAGNOSIS — Z1231 Encounter for screening mammogram for malignant neoplasm of breast: Secondary | ICD-10-CM

## 2010-05-13 NOTE — H&P (Signed)
[name], [name]             ACCOUNT NO.:  [id number]  MEDICAL RECORD NO.:  [id number]          PATIENT TYPE:  EMS  LOCATION:  MAJO                         FACILITY:  [facility]  PHYSICIAN:  Tia Masker, MD   DATE OF BIRTH:  Jul 24, 1932  DATE OF ADMISSION:  03/10/2010 DATE OF DISCHARGE:                             HISTORY & PHYSICAL   REASON FOR ADMISSION:  Acute respiratory failure.  HISTORY OF PRESENT ILLNESS:  The patient is a 77 year old white female with past medical history significant for nonischemic cardiomyopathy with ejection fraction of 35%-40%; history of complete heart block; status post pacemaker placement in 2002; chronic systolic congestive heart failure; hypothyroidism; hypertension; dyslipidemia; and history of partial colectomy who presented to [facility] with complaining of 2-3 day history of cold like symptoms including worsening shortness of breath and cough.  Today about 1 hour prior to admission, her shortness of breath became acutely more severe.  The patient associates this event with taking over-the-counter cough medication. She is unclear what cough medication she took.  Upon arrival in the emergency room, the patient was found to be in severe respiratory distress and she required endotracheal intubation.  She was also found to be hypotensive.  She was bolused with 2 liters of normal saline and was initiated on Levophed drip at 2 mcg.  The patient gave history that over the past 24 hours, she took total of 120 mg of Lasix p.o. due to her worsening shortness of breath.  In the emergency room, the patient had total 100 mL of urine output.  She was also treated with vancomycin and Zosyn IV and 40 mg of Lasix IV.  She was also given Benadryl and famotidine intravenously due to the fact that she described her shortness of breath worsening after taking over-the-counter cough medication.  PAST MEDICAL HISTORY:  As described  above.  ALLERGIES:  TAPER and VOLTAREN.  CURRENT MEDICATIONS:  Current medications at home:  Topical metronidazole, finasteride 2.5 mg daily, Synthroid 75 mcg daily, Altace 2.5 mg daily, Lasix 40 mg daily, amiodarone 200 mg daily, aspirin, Premarin, Caltrate, vitamin C, folic acid, Singulair, Claritin, vitamin D, spironolactone.  SOCIAL HISTORY:  The patient lives home with her husband.  She has two children and no history of alcohol, tobacco or illicit drug abuse.  FAMILY HISTORY:  Unable to obtain.  REVIEW OF SYSTEMS:  Unable to obtain.  PHYSICAL EXAMINATION:  VITAL SIGNS:  Blood pressure 106/60, heart rate 88, respirations 16, saturating 100.  Her temperature on admission was 96 degrees. GENERAL APPEARANCE:  Intubated and sedated with propofol. HEENT:  Pupils equal, round and reactive to light.  Extraocular muscle movements intact.  Anicteric. NECK:  Supple.  No JVD. LUNGS:  Coarse breath sounds with scattered crackles bilaterally. HEART:  Regular rate and rhythm.  S1, S2 appreciated.  No murmurs. Abdomen: Obese, soft, nontender, not distended.  Bowel sounds hypoactive. EXTREMITIES:  No edema, cyanotic and mottled appearance of upper and lower extremities.  LABORATORY DATA:  White blood cell count 6, hemoglobin 19, hematocrit 56, platelets clumped and there is no platelet result.  Sodium 142, potassium 2.9, chloride 105,  bicarb 21, glucose 155, BUN 23, creatinine is 1.69, AST 55, ALT 44, troponin 0.23 lactic acid 4.3, BNP 36.  ASSESSMENT AND PLAN:  A 77 year old white female with multiple medical problems. 1. Acute respiratory failure secondary to flash pulmonary edema and     sepsis, full vent support.  Follow with chest x-ray and ABGs.  Wean     as tolerated.  Patient was started on bronchodilators and IV     antibiotics. 2. Sepsis and septic shock.  Sepsis protocol was initiated.  Patient     is currently on pressor and is being titrated for mean arterial     blood  pressure of more than 65.  We are going to follow her lactic     acid and Pro calcitonin level.  We are going to check three sets of     cardiac enzymes, transthoracic echocardiogram as well as random     cortisol level.  She is going to be bolused with normal saline     according to sepsis protocol.  She was started on broad-spectrum     and IV antibiotics including vancomycin, Zosyn, and ciprofloxacin. 3. Acute kidney disease secondary to hypoperfusion and acute tubular     necrosis versus prerenal.  We are going to follow BUN and     creatinine and monitor her urine output.  The patient is going to     be hydrated with IV fluids. 4. Gastrointestinal and deep vein thrombosis prophylaxis.  Patient was     initiated on Protonix and SCDs.  At this time, I am going to hold     initiating subcutaneous heparin since there is no platelet level     available.  Her platelets were clumped on CBC obtained in the     emergency room. 5. The patient is full code.  Total critical care time 70 minutes.     Tia Masker, MD     BM/MEDQ  D:  03/10/2010  T:  03/10/2010  Job:  [id number]  Electronically Signed by Billy Fischer MD on 05/13/2010 11:54:11 AM

## 2010-05-23 ENCOUNTER — Ambulatory Visit
Admission: RE | Admit: 2010-05-23 | Discharge: 2010-05-23 | Disposition: A | Discharge status: Home / Self Care | Payer: Medicare Other | Source: Ambulatory Visit | Attending: Family Medicine | Admitting: Family Medicine

## 2010-05-23 DIAGNOSIS — Z1231 Encounter for screening mammogram for malignant neoplasm of breast: Secondary | ICD-10-CM

## 2010-06-07 LAB — BASIC METABOLIC PANEL
CO2: 25 mEq/L (ref 19–32)
CO2: 27 mEq/L (ref 19–32)
Calcium: 8.8 mg/dL (ref 8.4–10.5)
Chloride: 104 mEq/L (ref 96–112)
Creatinine, Ser: 1.59 mg/dL — ABNORMAL HIGH (ref 0.4–1.2)
GFR calc Af Amer: 37 mL/min — ABNORMAL LOW (ref >60)
GFR calc Af Amer: 38 mL/min — ABNORMAL LOW (ref >60)
GFR calc non Af Amer: 32 mL/min — ABNORMAL LOW (ref >60)
Glucose, Bld: 109 mg/dL — ABNORMAL HIGH (ref 70–99)
Potassium: 5.4 mEq/L — ABNORMAL HIGH (ref 3.5–5.1)
Sodium: 137 mEq/L (ref 135–145)

## 2010-06-07 LAB — GLUCOSE, CAPILLARY
Glucose-Capillary: 133 mg/dL — ABNORMAL HIGH (ref 70–99)
Glucose-Capillary: 133 mg/dL — ABNORMAL HIGH (ref 70–99)

## 2010-06-07 LAB — BASIC METABOLIC PANEL WITH GFR
BUN: 19 mg/dL (ref 6–23)
CO2: 24 meq/L (ref 19–32)
Calcium: 8.6 mg/dL (ref 8.4–10.5)
Chloride: 104 meq/L (ref 96–112)
Creatinine, Ser: 1.57 mg/dL — ABNORMAL HIGH (ref 0.4–1.2)
GFR calc non Af Amer: 32 mL/min — ABNORMAL LOW
Glucose, Bld: 124 mg/dL — ABNORMAL HIGH (ref 70–99)
Potassium: 3.7 meq/L (ref 3.5–5.1)
Sodium: 140 meq/L (ref 135–145)

## 2010-06-07 LAB — BRAIN NATRIURETIC PEPTIDE: Pro B Natriuretic peptide (BNP): 507 pg/mL — ABNORMAL HIGH (ref 0.0–100.0)

## 2010-06-07 LAB — CBC
Platelets: 109 10*3/uL — ABNORMAL LOW (ref 150–400)
RDW: 15 % (ref 11.5–15.5)

## 2010-07-16 NOTE — H&P (Signed)
[name], [name] NO.:  [id number]   MEDICAL RECORD NO.:  [id number]          PATIENT TYPE:  EMS   LOCATION:  MAJO                         FACILITY:  [facility]   PHYSICIAN:  Christell Faith, MD   DATE OF BIRTH:  [birth year]   DATE OF ADMISSION:  04/09/2007  DATE OF DISCHARGE:                              HISTORY & PHYSICAL   PRIMARY CARE PHYSICIAN:  Dr. Leda Min.   CHIEF COMPLAINT:  It felt like an elephant was sitting on my chest.   HISTORY OF PRESENT ILLNESS:  This is a 77 year old white female who  could not sleep tonight and then around 1:00 a.m., felt the abrupt onset  of significant chest heaviness. In her words, it felt like a 400 pound  elephant was sitting on my chest. She became very short of breath,  diaphoretic, and experienced nausea. She says she was too weak to stand  up and became very dizzy. She then felt clammy and was brought to the  emergency room. She is recovering from pneumonia, which was diagnosed  last month and treated with Avelox. She became hypoxic into the 80's in  the emergency department and was feeling better after Lasix and oxygen.  The chest heaviness has basically resolved.   PAST MEDICAL HISTORY:  1. History of complete heart block. Treated with a permanent      pacemaker, Medtronic dual chamber, placed in 2002.  2. The patient has cardiomyopathy of unknown etiology with mildly      reduced left ventricular function.  3. The patient had a cardiac catheterization 4 years ago in [place],      [place] after passing out, and according to the patient it was      negative.  4. Hypothyroidism.  5. Hyperlipidemia.  6. History of lumpectomy in left breast.  7. History of partial colectomy for a villous adenoma.   SOCIAL HISTORY:  She lives in [place]. She is married. She is a  retired [employer] fundraiser. Has 2 children and 1 grandchild. Has never smoked. Does not  drink.   FAMILY HISTORY:  Both parents died of strokes, one in his  39's and one  in her  31's.   ALLERGIES:  VARIOUS FRAGRANCES, REGULAR TAPE. She prefers paper tape.   MEDICATIONS:  1. Aspirin 325 mg p.o. daily.  2. Altace 2.5 mg p.o. b.i.d.  3. Lasix 20 mg daily p.r.n.  4. Amiodarone 200 mg p.o. daily.  5. Zocor 40 mg p.o. daily.  6. Synthroid 25 mcg p.o. daily.  7. Minocycline 50 mg p.o. every other day.  8. Premarin 0.3 mg every other day.  9. Potassium chloride 1 tablet daily.   REVIEW OF SYSTEMS:  Positive for fevers which she had with the  pneumonia. Positive for sweats. Positive for chest pain, shortness of  breath, dyspnea on exertion, weakness, nausea, and vomiting. The balance  of 14 systems is reviewed and is negative.   PHYSICAL EXAMINATION:  VITAL SIGNS:  Temperature 97.1, pulse 75,  respiratory rate 14. Blood pressure 121/60. Saturation 85% on room air,  up to 87% on non-rebreather mask.  GENERAL:  No acute distress. Alert and oriented times three. Pleasant,  older, white female.  HEENT:  Pupils are equal, round, and reactive. Extraocular movements  intact. Sclerae clear. Dentition is good.  NECK:  Supple. There is no invasive JVD. No carotid bruits.  CARDIAC:  Normal rate. Regular rhythm. Heart tones are distant but no  murmurs are appreciated. Dorsalis pedis and radial pulses are 2+  bilaterally.  LUNGS:  Dense crackles in the bases bilaterally with no dullness to  percussion. No egophony.  ABDOMEN:  Soft, nontender, and nondistended with no bruits. No  hepatosplenomegaly.  EXTREMITIES:  No edema. No rash.  SKIN:  Warm and dry. No cyanosis.  NEUROLOGIC:  Awake, alert, and oriented x3 with 5/5 strength in all 4  extremities. Facial expressions are symmetric.   LABORATORY DATA:  Chest x-ray shows probable congestive heart failure  versus less likely diagnosis of multi-lobar pneumonia.   Electrocardiogram is AV paced at 71 beats per minute.   White blood cell count 6.3. Hemoglobin 16.3. Platelets 199,000. Sodium  138,  potassium 3.7. BUN 20, creatinine 1.3. Glucose 120. BNP 71, CK MB  101, troponin less than 0.05.   IMPRESSION:  This is a 77 year old white female with chest heaviness  tonight and shortness of breath. Despite her BNP of less than 100, I  think her chest x-ray and physical exam both support congestive heart  failure. Likewise, I think the chest heaviness is most consistent with  unstable angina. However, pulmonary embolism remains a distinct  possibility.   PLAN:  1. Admit to step-down unit with a heart monitor to Dr. Verdis Prime.  2. Rule out myocardial infarction by cycling serial cardiac enzymes      and EKG's.  3. Make the patient NPO for possible cardiac catheterization.  4. Will check a transthoracic echocardiogram to evaluate left      ventricular function, right ventricular function and valvular      status.  5. Will continue her ace inhibitor and treat with IV Lasix. Will      maintain strict in's and out's and fluid restricted, sodium      restricted diet.  6. Will continue her aspirin and Statin and will initiate a heparin      drip, as therapy for both unstable angina and possible pulmonary      embolism.  7. To evaluate for possible veno-thrombo embolism will check a D-dimer      and lower extremity Doppler studies. If abnormal, the patient will      need VQ scan or CT angiogram.  8. The patient has a pacemaker which appears to be functioning well. I      am not sure why she is on amiodarone      but we will continue this medicine.  9. Given her recent history of pneumonia we will check a sputum      culture should she develop a productive cough.  10.Check TSH and fasting lipid panel.      Christell Faith, MD  Electronically Signed     NDL/MEDQ  D:  04/09/2007  T:  04/09/2007  Job:  [id number]   cc:   Vernona Rieger, M.D.

## 2010-07-16 NOTE — Cardiovascular Report (Signed)
[name], EID NO.:  [id number]   MEDICAL RECORD NO.:  [id number]          PATIENT TYPE:  INP   LOCATION:  3737                         FACILITY:  [facility]   PHYSICIAN:  [name], M.D.   DATE OF BIRTH:  1932/03/31   DATE OF PROCEDURE:  04/12/2007  DATE OF DISCHARGE:                            CARDIAC CATHETERIZATION   INDICATION FOR PROCEDURE:  Subacute onset of heart failure emanating in  acute respiratory distress requiring hospitalization.  This study is  being done to rule out coronary artery disease after echocardiogram  demonstrated a significant apical wall motion abnormality.   PROCEDURES PERFORMED:  1. Left heart catheterization.  2. Right heart catheterization.  3. Left ventriculography.  4. Renal abdominal aortography.  5. Selective coronary angiography.   DESCRIPTION:  After informed consent, a 6-French sheath was placed in  the right femoral artery using modified Seldinger technique and a 7-  French sheath was placed in the right femoral vein using the modified  Seldinger technique.  Xylocaine 1% was used for local anesthesia, 5 mg  of Valium was given orally before coming to the cath lab, and  0.5 mg of  IV Versed was given in the cath lab.   We performed right heart cath with a 6-French angled Swan-Ganz catheter.  Hemodynamic recorders were made from the right atrium to the pulmonary  capillary wedge position.  Main pulmonary artery O2 saturation was  obtained.  We performed left heart catheterization with an A2 6-French  multipurpose catheter.  A central ascending aortic O2 saturation was  obtained.  We then used this catheter for left ventriculography by hand  injection, pullback pressure across the aortic valve, and right coronary  angiography.  We then used a 6-French, #4 left Judkins catheter for left  coronary angiography.  After coronary angiography and finding no  specific explanation for the patient's presentation,  abdominal  aortography was performed to rule out bilateral renal artery stenosis  which can sometimes cause acute onset of pulmonary edema.  This was  performed with 6-French straight pigtail catheter at 18 mL/sec for a  total of 35 mL of Isovue.   No complications occurred during the procedure and manual compression  was used to achieve hemostasis.   RESULTS:  1. Hemodynamic Data:  (a)  Aortic pressure 112/51.  (b)  Left ventricular pressure 112/2 mmHg.  (c)  Mean right atrial pressure 6 mmHg.  (d)  Right ventricular pressure 22/4.  (e)  Pulmonary artery pressure 22/8.  (f)  Pulmonary capillary wedge mean pressure 11 mmHg.  Cardiac output by  thermodilution average was 3.13 liters/minute.  1. Left Ventriculography:  Left ventricular cavity is upper limit of      normal in size.  There is dyssynergy due to pacing of the right      ventricular apex.  In addition, there appears to be apical      including a distal anterior wall and distal inferior wall moderate      hypokinesis out of proportion to other wall movement.  The      estimated ejection fraction is 40%.  2. Coronary Angiography:  (a)  Left main coronary:  Normal.  (b)  Left anterior descending coronary:  LAD is a large vessel that  wraps around the left ventricular apex, gives origin to two diagonal  branches.  The vessel is normal.  (c)  Circumflex artery:  It is a large vessel that gives origin to 3  normal obtuse marginal branches, the vessel is entirely normal.  (d)  Right coronary:  The right coronary artery is dominant, gives PDA,  small left ventricle branch, and is normal.  1. Abdominal Aortography:  The abdominal aortogram is normal.  There      is widely patent flow into the left and right kidneys without      evidence of atherosclerosis or any significant plaque.   CONCLUSION:  1. Normal coronary arteries.  2. Normal right heart pressures.  3. Low cardiac output, by thermodilution at 3.13 liters/minute.   4. Left ventricular dysfunction complicated by right ventricular      pacing causing dyssynergy.  Despite the dyssynergic pattern, there      appears to be apical wall motion abnormality out of proportion to      other walls.  5. No evidence of renal artery stenosis by abdominal aortography.  6. The presentation with congestive heart failure is somewhat      puzzling.  Possible explanations include underlying nonischemic      cardiomyopathy with diastolic heart failure component, apical      ballooning syndrome with gradual recovery of LV function since      initial echo done 72 hours ago, versus some other pulmonary process      especially given the absence of elevation in BNP.  Amio lung needs      to be excluded.   PLAN:  1. Beta-blocker and ACE inhibitor therapy.  2. Consider pulmonary consultation.  3. Ambulation.  4. Discharge within the next 24 hours.      [name], M.D.  Electronically Signed     HWS/MEDQ  D:  04/12/2007  T:  04/12/2007  Job:  [id number]   cc:   Vernona Rieger, MD  Nolon Nations, MD

## 2010-07-19 NOTE — Op Note (Signed)
NAME:  [name], [name]                       ACCOUNT NO.:  [id number]   MEDICAL RECORD NO.:  [id number]                   PATIENT TYPE:  INP   LOCATION:  NA                                   FACILITY:  [facility]   PHYSICIAN:  Angelia Mould. Derrell Lolling, M.D.             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   OPERATION PERFORMED:  1. Rigid proctoscopy with [place] ink tattooing of tumor.  2. Low anterior resection of the rectum with coloproctostomy.   SURGEON:  Angelia Mould. Derrell Lolling, M.D.   FIRST ASSISTANT:  Timothy E. Earlene Plater, M.D.   OPERATIVE INDICATIONS:  This is a 77 year old white female, who was recently  evaluated for some abdominal pain and ultimately had a colonoscopy  performed.  Althea Grimmer. Santogade, M.D. found a 2 cm sessile polypoid mass in  the mid rectum about 11 cm from the anal verge, extending to about 14 cm  from the anal verge.  This was biopsied and showed villous adenoma with  focal high-grade dysplasia.  I had performed rigid proctoscopy myself and  felt that the tumor was about 13 cm.  CT scan shows no evidence of any  adenopathy or metastatic disease.  CEA level is normal.  She is brought to  the operating room electively following a bowel prep at home.   OPERATIVE FINDINGS:  The tumor in the mid rectum was about 13-14 cm by rigid  proctoscopy and was just about at the peritoneal reflection.  It was about 2  cm wide by about 3-4 cm in length and felt quite soft and fleshy and  certainly did not look or feel invasive.  There was no adenopathy noted.  The liver felt normal.  The small bowel felt normal.  The uterus was  surgically absent.  The ovaries were atrophic.   OPERATIVE TECHNIQUE:  Following the  induction of general endotracheal  anesthesia, a Foley catheter was inserted.  The patient was placed in rigid  stirrups.  Rigid proctoscopy was carried out, and I identified the tumor at  about 13-14 cm, slightly on the right side, and I injected a little bit of  [place] ink in that location.   The patient's abdomen and perineum was then prepped and draped in a sterile  fashion.  Midline laparotomy was performed in the lower midline, excising  the previous scar from her hysterectomy.  The abdomen was entered under  direct vision in the midline.  The abdomen was explored with findings as  described above.   We had some adhesions to the left pelvic sidewall which were taken down,  mobilizing the sigmoid colon and unfolding the sigmoid colon completely.  We  incised the mesentery using electrocautery and the harmonic scalpel.  We  divided the mid sigmoid colon between [facility] clamps.  The mesenteric vessels  were isolated, clamped, divided, and ligated with 2-0 silk ties.  We took  the dissection down close to the sacral promontory in the midline.  We  incised the peritoneum all the way around the anterior surface of the mid  rectum at the peritoneal reflection, and the colon mobilized up quite  nicely.  We could identify the [place] ink, and we could actually palpate the  tumor and placed a suture in that location for visual identification.  We  continued to take down the mesentery posteriorly.  Larger vessels were  ligated with 2-0 silk ties.  Laterally, we took down the lateral attachments  with the harmonic scalpel until we were about 4-5 cm below the tumor.  We  took a 60 mm reticulating stapling device and placed it across the mid  rectum about 4 cm distal to the tumor.  We placed a clamp above this.  We  filed the stapler and then divided the colon and removed the stapler.  The  staple line looked good.   I took the colon specimen to the side table and examined it.  I found the  tumor  as described, and I had at least a 3 cm distal margin and a long  proximal margin.  The specimen was sent to pathology.   We sized the proximal colon segment, found that it would admit a 29 mm  stapling device.  I placed a pursestring suture of 2-0 Prolene in the  proximal colonic segment.  We brought a 29 mm EEA stapler to the operative  field.  We took the anvil off and inserted it into the proximal colon and  then tied the pursestring suture.  Dr. Earlene Plater then went down to the perineum  and exposed the anus.  He gently dilated the anus and then inserted the  stapling device.  Under direct vision, we positioned the stapler in the  center of the rectal stump and in the center of the staple line, and then  the stapler was opened, and then the spike was passed through the wall of  the colon all the way.  We then inserted the sigmoid colon and the anvil  onto the spike, being careful not to twist the colon in any way.  We then  closed the stapler very carefully to the appropriate level, fired the  stapler, opened it, and removed it.  We had two complete donuts of tissue  and a distal donut of tissue was sent for pathologic examination separately.  Proctoscopy was carried out under direct vision.  There was no bleeding from  the staple line.  The staple line was airtight under water with insufflation  of the proctoscope and proximal occlusion.  The air was evacuated.  The  proctoscope was removed.   We then changed our instruments and gloves.  We irrigated the lower abdomen  and pelvis copiously with several liters of saline.  There was absolutely no  bleeding whatsoever.  We removed all of our packs and instruments and  returned the small bowel and omentum to their anatomic position.  The  midline fascia was closed with a running suture of #1 PDS and the skin  closed with skin staples.  Clean bandages were placed and the patient taken to the recovery room in stable condition.  Estimated blood  loss was  about  150-200 cc.  Complications none.  Sponge, needle, and instrument counts were  correct.                                               Angelia Mould. Derrell Lolling, M.D.    HMI/MEDQ  D:  03/11/2002  T:  03/11/2002  Job:  [id number]   cc:   Althea Grimmer. Luther Parody, M.D.  [address], [address]  [place]  [place] [address]  Fax: [phone]   Karene Fry. Thomes Lolling, M.D.  [facility]  [place], [place]   Nolon Nations, M.D.

## 2010-07-19 NOTE — Discharge Summary (Signed)
[name], [name]             ACCOUNT NO.:  [id number]   MEDICAL RECORD NO.:  [id number]          PATIENT TYPE:  INP   LOCATION:  3737                         FACILITY:  [facility]   PHYSICIAN:  [name], M.D.   DATE OF BIRTH:  February 20, 1933   DATE OF ADMISSION:  04/09/2007  DATE OF DISCHARGE:  04/13/2007                               DISCHARGE SUMMARY   DISCHARGE DIAGNOSES:  1. Acute left ventricular systolic heart failure, resolved.  2. Nonischemic cardiomyopathy ejection fraction 40% with normal      coronary arteries.  3. History of third-degree atriovenous block, history of DDD pacer.  4. History of syncope with normal catheterization approximately 4      years ago.  5. History of atrial fibrillation on amiodarone therapy.  6. Hypothyroidism.  7. 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  8. Hyperlipidemia.   HOSPITAL COURSE:  [name] was admitted to [facility] on  April 09, 2007 complaining as though she felt like an elephant was  sitting on her chest.  She had a normal cardiac catheterization several  years ago in [place], [place] and records indicate that she tells the  admitting physician that this catheterization showed mildly reduced LV  function.  She was admitted to [facility] and the following lab  results were found to show white count 8.2, hemoglobin 13.5, hematocrit  39, platelets 226, sedimentation rate 3, D-dimer 6.71.  CT of the chest  negative for PE.  EKG:  AV pacing   Please note the patient has been recently treated for pneumonia by Dr.  Lupita Leash in [place].  She had just stopped her antibiotics the day  prior to admission.  Echocardiogram showed an EF of 35% with akinesis in  the mid distal and anterior septal wall, moderate aortic regurgitation.  Ultimately we diuresed her enough that her congestive heart failure  improved.  We did perform left and right heart catheterization.  The  right heart catheterization was normal  with good pressures.  The LV gram  showed apical moderate hypokinesis with dyssynergy secondary to  pacemaker.  EF was 40%.  Her  coronary angiography showed nonobstructive  disease.   Dr. Katrinka Blazing documented the congestive heart failure with decreased LV  function and thought this could be possible [place] in nature, but felt  that the negative cardiac markers were atypical for this syndrome.  Her  medications were adjusted and she was then discharged to home the  following day with medical management therapy.   PATIENT'S DISCHARGE MEDICATIONS:  1. Baby aspirin a day.  2. Altace 2.5 mg twice a day.  3. Zocor 40 mg a day,  4. Amiodarone 200 mg a day.  5. Synthroid 25 mcg a day.  6. Premarin 0.3 mg a day.  7. Minocycline 50 mg every other day.  8. Lasix 20 mg a day.  9. Coreg 3.125 mg twice a day.  10.Sublingual nitroglycerin p.r.n. chest pain.  11.Potassium.  She has been instructed foods high in potassium such as      bananas, orange juice.   The patient is to remain on a  low-sodium heart-healthy diet.  Increase  activity slowly.  Follow up at Dr. Michaelle Copas, Tillman Sers, nurse  practitioner on April 27, 2007 at 1:30 p.m.      Guy Franco, P.A.      [name], M.D.  Electronically Signed    LB/MEDQ  D:  04/29/2007  T:  04/30/2007  Job:  [id number]   cc:   Will Ameen, Dr.  [name], M.D.

## 2010-07-19 NOTE — Op Note (Signed)
[name], [name]             ACCOUNT NO.:  [id number]   MEDICAL RECORD NO.:  [id number]          PATIENT TYPE:  AMB   LOCATION:  ENDO                         FACILITY:  [facility]   PHYSICIAN:  Laira Friar, MDDATE OF BIRTH:  Feb 22, 1933   DATE OF PROCEDURE:  06/16/2006  DATE OF DISCHARGE:                               OPERATIVE REPORT   PROCEDURE:  Colonoscopy.   INDICATION:  History of polyps (surveillance colonoscopy being done),  status post low anterior resection with proctostomy due to history of  villous adenoma.   MEDICATIONS:  Fentanyl 75 mcg IV, Versed 7 mg IV.   FINDINGS:  Rectal exam was normal.  The adult Pentax colonoscope was  inserted into a well prepped colon and advanced to the cecum where the  ileocecal valve and appendiceal orifice were identified.  In order to  reach the cecum, the patient had to be placed in a supine position and  abdominal pressure had to be applied with repeated loop reduction.  After doing so, the appendiceal orifice and ileocecal valve was  identified.  Careful withdrawal of the colonoscope revealed a 2 mm flat  polyp in the transverse colon that was cold biopsied for histology.  On  further withdrawal of the colonoscope, a 5 mm sessile polyp was removed  with snare cautery and retrieved.  Retroflexion showed small internal  hemorrhoids.   ASSESSMENT:  1. Colon polyps status post removal as stated above.  2. Internal hemorrhoids.   PLAN:  1. Follow-up on pathology.  2. Continue home medicines as prescribed.  3. High fiber diet.      Kamayah Friar, MD  Electronically Signed     VCS/MEDQ  D:  06/16/2006  T:  06/16/2006  Job:  [id number]   cc:   Angelia Mould. Derrell Lolling, M.D.

## 2010-07-19 NOTE — Discharge Summary (Signed)
NAME:  TELESHIA, LEMERE                       ACCOUNT NO.:  [id number]   MEDICAL RECORD NO.:  [id number]                   PATIENT TYPE:  INP   LOCATION:  0361                                 FACILITY:  [facility]   PHYSICIAN:  Angelia Mould. Derrell Lolling, M.D.             DATE OF BIRTH:  04/30/1932   DATE OF ADMISSION:  03/11/2002  DATE OF DISCHARGE:  03/19/2002                                 DISCHARGE SUMMARY   FINAL DIAGNOSES:  1. Villous adenoma of the rectum with focus of high-grade dysplasia.  2. Status post transvenous pacemaker insertion.  3. Hypertension.   OPERATION PERFORMED:  Low anterior resection with coloproctoscopy, 03/11/02.   HISTORY:  This is a 77 year old, white female, who had an episode of  abdominal pain and abdominal distention in August of this year.  Elective GI  work-up was recommended.  Colonoscopy was performed by Althea Grimmer. Santogade,  M.D. on 02/02/02, and he found a noncircumferential polypoid mass of the  rectum at about 11-15 cm.  Biopsy showed villous adenoma with focal high-  grade dysplasia.  I was asked to see the patient as an outpatient.   PHYSICAL EXAMINATION:  GENERAL:  Extremely pleasant, thin, older woman, who  appears fit for her age.  She is slightly anxious.  NECK:  No mass or bruit.  LUNGS:  Clear.  HEART:  Regular rate and rhythm.  Pacemaker noted in the left  infraclavicular area.  ABDOMEN:  Soft, nontender, no mass.   HOSPITAL COURSE:  The patient underwent a two day bowel prep at home and was  admitted to the hospital electively on March 11, 2002.  She was taken to  the operating room.  We performed proctoscopy and marked the polyp with  [place] ink to tattoo the area.  She underwent laparotomy and low anterior  resection.  A low stapled anastomosis with the EEA stapler was performed.  We were able to palpate the tumor and get around it quite nicely.  The  surgery was uneventful.   Final pathology report showed a tubulovillous adenoma  with a focus of high-  grade glandular dysplasia, but no invasive cancer was identified.  Surgical  margins of resection were free of adenomatous change.  There were benign  pericolonic lymph nodes.   Postoperatively, the patient did relatively well.  She had an ileus for  several days, and we kept the nasogastric tube in for about 4-5 days but  ultimately will remove this.  She had relative hypokalemia, and we adjusted  the potassium in her IV fluids, and that corrected.  She did complain of a  vaginal itching and discharge, and we put her on Diflucan, and her symptoms  resolved.  Urine culture grew yeast only.  She progressed in her diet and  activities relatively uneventfully and was able to be discharged on March 18, 2002.  At that time, she was afebrile.  Her wound looked fine.  She was  feeling well and tolerating a regular diet.  The date of discharge was  actually March 19, 2002.   DISCHARGE MEDICATIONS:  Vicodin for pain and asked to resume all of her  usual medications.  Her usual medications are Premarin 0.9 mg daily; Altace  5 mg daily.  The patient was to take Altace 5 mg at bedtime and 2.5 mg in  the morning.  Lasix 20 mg p.r.n. fluid and Osteo Bi-Flex, glucosamine, and  multivitamins.   DISCHARGE INSTRUCTIONS:  She was given instruction in diet and activities.  She is asked to return to see me in the office in one week.                                               Angelia Mould. Derrell Lolling, M.D.    HMI/MEDQ  D:  03/26/2002  T:  03/26/2002  Job:  [id number]   cc:   Althea Grimmer. Luther Parody, M.D.  [address]  [place]  [place] [address]  Fax: [phone]   Dr. Montez Morita, [place]   Nolon Nations, M.D.

## 2010-08-23 ENCOUNTER — Other Ambulatory Visit: Payer: Self-pay | Admitting: Family Medicine

## 2010-08-23 ENCOUNTER — Ambulatory Visit
Admission: RE | Admit: 2010-08-23 | Discharge: 2010-08-23 | Disposition: A | Discharge status: Home / Self Care | Payer: Medicare Other | Source: Ambulatory Visit | Attending: Family Medicine | Admitting: Family Medicine

## 2010-08-23 DIAGNOSIS — J189 Pneumonia, unspecified organism: Secondary | ICD-10-CM

## 2010-11-22 LAB — BASIC METABOLIC PANEL
BUN: 14
BUN: 16
BUN: 16
BUN: 17
CO2: 25
CO2: 26
Chloride: 101
Chloride: 106
Creatinine, Ser: 0.95
Creatinine, Ser: 1.07
Creatinine, Ser: 1.11
GFR calc Af Amer: >60
GFR calc Af Amer: >60
GFR calc non Af Amer: 44 — ABNORMAL LOW
GFR calc non Af Amer: 50 — ABNORMAL LOW
GFR calc non Af Amer: 52 — ABNORMAL LOW
GFR calc non Af Amer: 58 — ABNORMAL LOW
Glucose, Bld: 107 — ABNORMAL HIGH
Glucose, Bld: 113 — ABNORMAL HIGH
Glucose, Bld: 120 — ABNORMAL HIGH
Potassium: 3.4 — ABNORMAL LOW
Potassium: 4.1
Potassium: 4.2
Potassium: 4.7
Sodium: 138
Sodium: 140

## 2010-11-22 LAB — CBC
HCT: 39
HCT: 39.4
HCT: 40.6
Hemoglobin: 13.5
Hemoglobin: 13.8
Hemoglobin: 16.3 — ABNORMAL HIGH
MCHC: 34.1
MCHC: 34.1
MCV: 90.4
Platelets: 197
Platelets: 201
Platelets: 226
RBC: 4.3
RBC: 5.27 — ABNORMAL HIGH
RDW: 14.1
RDW: 14.2
WBC: 10.8 — ABNORMAL HIGH
WBC: 6.3
WBC: 8.2

## 2010-11-22 LAB — MAGNESIUM: Magnesium: 2

## 2010-11-22 LAB — CARDIAC PANEL(CRET KIN+CKTOT+MB+TROPI)
CK, MB: 1.3
Relative Index: INVALID
Troponin I: 0.01

## 2010-11-22 LAB — D-DIMER, QUANTITATIVE: D-Dimer, Quant: 6.71 — ABNORMAL HIGH

## 2010-11-22 LAB — DIFFERENTIAL
Basophils Relative: 0
Lymphocytes Relative: 17
Lymphs Abs: 1.1
Monocytes Absolute: 0 — ABNORMAL LOW
Monocytes Relative: 0 — ABNORMAL LOW
Neutro Abs: 5.2
Neutrophils Relative %: 82 — ABNORMAL HIGH

## 2010-11-22 LAB — POCT CARDIAC MARKERS
CKMB, poc: <1 — ABNORMAL LOW
Operator id: 133351
Troponin i, poc: <0.05
Troponin i, poc: <0.05

## 2010-11-22 LAB — B-NATRIURETIC PEPTIDE (CONVERTED LAB): Pro B Natriuretic peptide (BNP): 71

## 2010-11-22 LAB — I-STAT 8, (EC8 V) (CONVERTED LAB)
Acid-base deficit: 1
Bicarbonate: 25 — ABNORMAL HIGH
Chloride: 106
HCT: 50 — ABNORMAL HIGH
Hemoglobin: 17 — ABNORMAL HIGH
Operator id: 270651
Sodium: 138
pCO2, Ven: 45.4

## 2010-11-22 LAB — PROTIME-INR
INR: 1
Prothrombin Time: 13

## 2010-11-22 LAB — POCT I-STAT 3, VENOUS BLOOD GAS (G3P V)
O2 Saturation: 68
TCO2: 31
pH, Ven: 7.392 — ABNORMAL HIGH

## 2010-11-22 LAB — POCT I-STAT 3, ART BLOOD GAS (G3+)
Acid-Base Excess: 5 — ABNORMAL HIGH
Operator id: 149272
pH, Arterial: 7.388

## 2010-11-22 LAB — LIPID PANEL: Cholesterol: 136

## 2010-11-22 LAB — HEPARIN LEVEL (UNFRACTIONATED): Heparin Unfractionated: 1.08 — ABNORMAL HIGH

## 2010-11-22 LAB — POCT I-STAT CREATININE: Creatinine, Ser: 1.3 — ABNORMAL HIGH

## 2011-01-31 ENCOUNTER — Telehealth: Payer: Self-pay | Admitting: Hematology and Oncology

## 2011-01-31 NOTE — Telephone Encounter (Signed)
gv pt appt for 12/11 @ 10 am w/LO.

## 2011-02-11 ENCOUNTER — Other Ambulatory Visit: Payer: Self-pay | Admitting: Hematology and Oncology

## 2011-02-11 ENCOUNTER — Ambulatory Visit (HOSPITAL_BASED_OUTPATIENT_CLINIC_OR_DEPARTMENT_OTHER): Payer: Medicare Other

## 2011-02-11 ENCOUNTER — Ambulatory Visit: Payer: Medicare Other

## 2011-02-11 ENCOUNTER — Ambulatory Visit (HOSPITAL_BASED_OUTPATIENT_CLINIC_OR_DEPARTMENT_OTHER): Payer: Medicare Other | Admitting: Hematology and Oncology

## 2011-02-11 VITALS — BP 149/71 | HR 73 | Temp 97.0°F | Ht 63.0 in | Wt 168.2 lb

## 2011-02-11 DIAGNOSIS — D72829 Elevated white blood cell count, unspecified: Secondary | ICD-10-CM

## 2011-02-11 DIAGNOSIS — J329 Chronic sinusitis, unspecified: Secondary | ICD-10-CM

## 2011-02-11 LAB — CBC WITH DIFFERENTIAL/PLATELET
Basophils Absolute: 0 10*3/uL (ref 0.0–0.1)
EOS%: 1.9 % (ref 0.0–7.0)
HCT: 39.4 % (ref 34.8–46.6)
HGB: 12.7 g/dL (ref 11.6–15.9)
MCH: 26.5 pg (ref 25.1–34.0)
MCV: 82.1 fL (ref 79.5–101.0)
NEUT%: 63.1 % (ref 38.4–76.8)
Platelets: 308 10*3/uL (ref 145–400)
lymph#: 2 10*3/uL (ref 0.9–3.3)

## 2011-02-11 NOTE — Progress Notes (Signed)
This office note has been dictated.

## 2011-02-11 NOTE — Progress Notes (Signed)
CC:   [name]. [name], M.D. Angela Keas, MD [name], M.D. Angela Friar, MD  IDENTIFYING STATEMENT:  The patient is a 77 year old woman seen at request of Dr. Valentina Fitzgerald with leukocytosis.  HISTORY OF PRESENT ILLNESS:  The patient was found to have persistently elevated white blood cell counts.  Most recent series of CBCs noted the following:  01/20/2011 white cell count 16.4, hemoglobin 12.7, hematocrit 39.1, platelets clumped; 01/07/2011 white cell count 16.8, hemoglobin 12.8, hematocrit 39.6, platelets clumped; 12/30/2010 white cell count 17.4, hemoglobin 13.2, hematocrit 40.8, platelets clumped.  Nine months ago on 03/10/2010, white cell count was 16, hemoglobin 9.5, hematocrit 29.1, platelets 248; and 03/22/2010 white count cell count fell down to 10.6, hemoglobin 9.8, hematocrit 31, platelets were clumped.  The patient informs me that she gets bronchitis quite frequently.  Her last bout was this summer.  However, earlier this year she was hospitalized and treated for bronchitis.  She also has a history of chronic sinusitis and gets constant postnasal drip.  She is presently afebrile.  She denies fever, chills, or night sweats.  Her weight is stable.  She eats a well- balanced meal.  She has not noted any enlarging lymph nodes.  She has a history of chronic back pain and is known to have inflammatory degenerative disk disease.  She works with a Land.  She has not received any recent steroid injections.  She also has a history of generalized arthritis.  She also has history of acne rosacea.  She has had no recent travels.  She denies diarrhea.  PAST MEDICAL HISTORY: 1. Hypothyroidism. 2. History of atrial fibrillation. 3. History of cardiomyopathy. 4. History of complete heart block, status post pacemaker. 5. 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 6. History of ovarian cyst, followed by Dr. Leda Fitzgerald. 7. Status post partial  hysterectomy. 8. History of degenerative disk disease. 9. History of multiple allergies. 10.Acne rosacea. 11.History of pneumonia.  ALLERGIES:  Voltaren, Daypro, dapsone, colchicine, carvedilol, Bystolic, __________, metoprolol, methotrexate, Atarax, hydroxyzine, cocamidopropyl fragrance.  MEDICATIONS:  Altace 2.5 mg daily, vitamin C 500 mg daily, folic acid 1 mg daily, Claritin 10 mg daily, Caltrate 600 mg daily, vitamin D3 1000 units daily, Osteo Bi-Flex 1 tablet daily, MiraLAX, Advair Diskus 1 puff as needed, aspirin 325 mg daily, amiodarone 200 mg daily, Zocor 20 mg q.h.s., Zetia 10 mg daily, Synthroid 75 mcg daily, Premarin 0.3 mg for 3 weeks, off 1 week orally, spironolactone 25 mg Mondays, Wednesdays, and Fridays, Singulair 10 mg q.h.s., fluticasone 50 mcg 2 sprays nasally, Lasix 40 mg p.r.n.  SOCIAL HISTORY:  The patient is married with 2 children.  She denies alcohol tobacco use.  She is a retired Production assistant, radio.  FAMILY HISTORY:  The patient's aunt had breast cancer.  Denies history for hematology disorders, specifically leukemia.  HEALTH MAINTENANCE:  Last colonoscopy was 2 years ago.  Last mammogram was a year ago.  Receives close followup with Dr. Garnette Fitzgerald and [name].  REVIEW OF SYSTEMS:  Constitutional:  Denies fever, chills, night sweats, anorexia, weight loss.  GI:  Denies nausea, vomiting, abdominal pain, diarrhea, melena, hematochezia.  Cardiovascular:  Denies chest pain, PND, orthopnea, ankle swelling.  Respirations:  Denies cough, hemoptysis, wheeze, shortness of breath.  Skin:  Has acne rosacea, but denies petechiae or bruising.  Musculoskeletal:  Has chronic low back pain.  CNS:  Denies headaches, vision changes, or extremity weakness. Rest of review of systems negative.  PHYSICAL EXAMINATION:  General:  The patient is a well-appearing, well- nourished woman in no current distress.  Vitals:  Pulse 73, blood pressure 149/71, temperature 97,  respirations 18, weight 268 pounds. HEENT:  Head is atraumatic, normocephalic.  Sclerae are anicteric. Pupils equal, round, and reactive to light.  Mouth moist without ulcerations, thrush, or lesions.  Neck:  Supple without adenopathy. Chest:  Clear.  CVS:  First and second heart sounds present.  Abdomen: Soft.  No hepatosplenomegaly.  No masses.  Bowel sounds present. Extremities:  No calf tenderness.  Pulses present and symmetrical. Lymph Nodes:  No palpable adenopathy.  CNS:  Nonfocal.  IMPRESSION AND PLAN:  [name] is a 77 year old woman found to have persistent leukocytosis.  She is currently asymptomatic except for chronic sinusitis.  She also has multiple allergies.  I reviewed her drug list and her current medications are not necessarily known to specifically cause elevated white cell counts.  We need to rule out a myeloproliferative disorder versus reactive etiology.  Thus, the patient will have a CBC with differential repeated.  We will review morphology. Will obtain a comprehensive metabolic panel with LDH.  We will obtain blood for JAK2 mutation and BCR-ABL, both ruling out myeloproliferative disorder.  We will obtain a leukocyte alkaline phosphatase.  Will also obtain a serum protein electrophoresis with ANA and rheumatoid factor to rule out underlying inflammatory joint disease.  I spent more than half the time discussing potential diagnosis and plan.  The patient had a number of questions, which were answered.  The patient follows up to discuss results.    ______________________________ [name], M.D. LIO/MEDQ  D:  02/11/2011  T:  02/11/2011  Job:  [id number]

## 2011-02-11 NOTE — Progress Notes (Signed)
Dr.     Maurice Small      -       Primary. Dr.     Mendel Ryder      -     Cardiologist. Dr.     Corrie Mckusick      -     GYN. Dr.     Edythe Lynn        -       Surgeon.   [facility]    On    [address].

## 2011-02-13 ENCOUNTER — Telehealth: Payer: Self-pay | Admitting: *Deleted

## 2011-02-13 LAB — ANTI-NUCLEAR AB-TITER (ANA TITER): ANA Titer 1: 1:80 {titer} — ABNORMAL HIGH

## 2011-02-13 LAB — SPEP & IFE WITH QIG
Alpha-1-Globulin: 4.8 % (ref 2.9–4.9)
Alpha-2-Globulin: 14.4 % — ABNORMAL HIGH (ref 7.1–11.8)
Beta 2: 5.8 % (ref 3.2–6.5)
Beta Globulin: 7.3 % — ABNORMAL HIGH (ref 4.7–7.2)
Gamma Globulin: 13.9 % (ref 11.1–18.8)
IgM, Serum: 222 mg/dL (ref 52–322)

## 2011-02-13 LAB — COMPREHENSIVE METABOLIC PANEL
Albumin: 4 g/dL (ref 3.5–5.2)
Alkaline Phosphatase: 74 U/L (ref 39–117)
BUN: 22 mg/dL (ref 6–23)
Calcium: 9.1 mg/dL (ref 8.4–10.5)
Glucose, Bld: 93 mg/dL (ref 70–99)
Potassium: 4.1 mEq/L (ref 3.5–5.3)

## 2011-02-13 LAB — RHEUMATOID FACTOR: Rhuematoid fact SerPl-aCnc: <10 IU/mL (ref <=14)

## 2011-02-13 LAB — ANA: Anti Nuclear Antibody(ANA): POSITIVE — AB

## 2011-02-13 NOTE — Telephone Encounter (Signed)
Opened in error

## 2011-03-05 ENCOUNTER — Encounter: Payer: Self-pay | Admitting: Hematology and Oncology

## 2011-03-05 ENCOUNTER — Ambulatory Visit (HOSPITAL_BASED_OUTPATIENT_CLINIC_OR_DEPARTMENT_OTHER): Payer: Medicare Other | Admitting: Hematology and Oncology

## 2011-03-05 VITALS — BP 143/75 | HR 76 | Ht 63.0 in | Wt 169.0 lb

## 2011-03-05 DIAGNOSIS — D72829 Elevated white blood cell count, unspecified: Secondary | ICD-10-CM

## 2011-03-05 NOTE — Progress Notes (Signed)
This office note has been dictated.

## 2011-03-06 NOTE — Progress Notes (Signed)
CC:   [name]. [name], M.D. [name] MD [name], M.D. [name], MD  IDENTIFYING STATEMENT:  The patient is a 78 year old woman who presents to discuss results.  INTERVAL HISTORY:  In summary, the patient was found to have persistently elevated white blood cell count and was referred to Hematology for further evaluation.  Of note,  01/10/2011 noted a CBC of 16.4 with clumped platelets.  Besides having history for bronchitis and chronic sinusitis, the patient has had no admissions for pneumonia.  She is also known to have DJD and chronic back pain.  Laboratory data on 02/11/2011, white cell count 7.9, hemoglobin 12.7, hematocrit 39.4, platelets 308, MCV 82.1; differential unremarkable; peripheral smear reveals normal-appearing white blood cells, red blood cells, and platelets; BUN and creatinine were 22 and 1.43, respectively; LDH 198; ANA positive.  Rheumatoid factor was less than 10.  ANA titer was positive at 118; serum protein electrophoresis did not detect an M- spike and pattern was nonspecific.  PAST MEDICAL HISTORY:  Unchanged.  FAMILY HISTORY:  Unchanged.  SOCIAL HISTORY:  Unchanged.  MEDICATIONS:  Reviewed and updated.  REVIEW OF SYSTEMS:  A 10-point review of systems is negative.  PHYSICAL EXAMINATION:  General Appearance:  The patient is a well- appearing, well-nourished woman in no distress.  Vital Signs:  Pulse 76. Blood pressure 143/75.  Temperature 97.3.  Respirations 20.  Weight 169 pounds.  HEENT:  Head is atraumatic, normocephalic.  Sclerae are anicteric.  Mouth is moist.  Neck:  Supple.  Chest:  Clear.  CVS: Unremarkable.  Abdomen:  Soft.  Extremities:  No edema.  Lymph Nodes: No adenopathy.  CNS:  Nonfocal.  LABORATORY DATA:  As above.  In addition, sodium 139, potassium 4.1, chloride 103, CO2 25, BUN 22, creatinine 1.43 (1.33). total bilirubin 0.5, alkaline phosphatase 74, AST 57, ALT 47, calcium 9.1.  IMPRESSION AND PLAN:   [name] is a pleasant 78 year old woman found to have persistent leukocytosis.  However, we repeated a CBC in the clinic and her blood count and CBC are within normal parameters.  I would recommend that CBC be drawn in a citrate-based tube as her platelets have tency to clump which can affect altering the results of WBC. Of some note, she was informed of renal insufficiency and I have urged her to follow up with her primary physician. Her LFTs are also abnormal. Also note that she is on medications that can do this.  She notes that she does follows this closely with her other care team.  Otherwise, she was reassured.  She had some questions, which were answered.  She followups p.r.n.    ______________________________ [name] Record, M.D. LIO/MEDQ  D:  03/05/2011  T:  03/05/2011  Job:  [id number]

## 2011-04-14 ENCOUNTER — Other Ambulatory Visit: Payer: Self-pay | Admitting: Family Medicine

## 2011-04-14 DIAGNOSIS — Z1231 Encounter for screening mammogram for malignant neoplasm of breast: Secondary | ICD-10-CM

## 2011-04-25 ENCOUNTER — Other Ambulatory Visit: Payer: Self-pay | Admitting: Family Medicine

## 2011-04-25 DIAGNOSIS — M5430 Sciatica, unspecified side: Secondary | ICD-10-CM

## 2011-05-05 ENCOUNTER — Ambulatory Visit
Admission: RE | Admit: 2011-05-05 | Discharge: 2011-05-05 | Disposition: A | Discharge status: Home / Self Care | Payer: Medicare Other | Source: Ambulatory Visit | Attending: Family Medicine | Admitting: Family Medicine

## 2011-05-05 DIAGNOSIS — M5430 Sciatica, unspecified side: Secondary | ICD-10-CM

## 2011-05-30 ENCOUNTER — Ambulatory Visit
Admission: RE | Admit: 2011-05-30 | Discharge: 2011-05-30 | Disposition: A | Discharge status: Home / Self Care | Payer: Medicare Other | Source: Ambulatory Visit | Attending: Family Medicine | Admitting: Family Medicine

## 2011-05-30 DIAGNOSIS — Z1231 Encounter for screening mammogram for malignant neoplasm of breast: Secondary | ICD-10-CM

## 2011-06-02 DIAGNOSIS — H269 Unspecified cataract: Secondary | ICD-10-CM

## 2011-06-02 HISTORY — PX: CATARACT EXTRACTION: SUR2

## 2011-06-02 HISTORY — DX: Unspecified cataract: H26.9

## 2011-08-27 ENCOUNTER — Encounter: Payer: Self-pay | Admitting: Internal Medicine

## 2011-08-27 ENCOUNTER — Telehealth: Payer: Self-pay | Admitting: Internal Medicine

## 2011-08-27 ENCOUNTER — Ambulatory Visit (INDEPENDENT_AMBULATORY_CARE_PROVIDER_SITE_OTHER): Payer: Medicare Other | Admitting: Internal Medicine

## 2011-08-27 VITALS — BP 130/64 | HR 74 | Ht 63.5 in | Wt 166.4 lb

## 2011-08-27 DIAGNOSIS — I428 Other cardiomyopathies: Secondary | ICD-10-CM

## 2011-08-27 DIAGNOSIS — I4891 Unspecified atrial fibrillation: Secondary | ICD-10-CM

## 2011-08-27 DIAGNOSIS — I442 Atrioventricular block, complete: Secondary | ICD-10-CM

## 2011-08-27 NOTE — Patient Instructions (Signed)
Your physician recommends that you schedule a follow-up appointment in: as needed  

## 2011-08-27 NOTE — Telephone Encounter (Signed)
Pt needs the print out of the changes made to her device today because she is traveling and her husband will pick it up in the morning

## 2011-08-28 ENCOUNTER — Encounter: Payer: Self-pay | Admitting: Internal Medicine

## 2011-08-28 NOTE — Progress Notes (Signed)
Angela Divine, MD: Primary Cardiologist:  Dr Selmer Dominion is a 78 y.o. female with a h/o complete heart block sp PPM 2002 with subsequent upgrade by me 11/16/2008 to a biventricular pacemaker.  She did very well s/p upgrade. She reported improvement in exercise tolerance initially following upgrade.  Upon last being seen by me 05/23/09 she was doing quite well. She reports doing well until recently.  She reports that upon recent device interrogation, an adjustment was made which she feels has caused overall decline.  Interestingly, she states that she can typically "feel her heart beat when the pacemaker is working".  Since her recent device check, she does not feel that he device is pacing her heart "as forcefully".  She has therefore returned to my office today for further assessment.  This ordeal has created a bit of anxiety for her.   Today, she  denies symptoms of palpitations, chest pain, shortness of breath, lower extremity edema,  presyncope, syncope, or neurologic sequela.  She reports occasional episodes of dizziness.  The patientis tolerating medications without difficulties and is otherwise without complaint today.   Past Medical History  Diagnosis Date  . Hypothyroid   . Complete heart block 2002    Initial PPM 2002 with upgrade by JA to CRT-P 11/21/08  . Nonischemic cardiomyopathy   . Persistent atrial fibrillation   . Hypertension   . Hyperlipidemia    Past Surgical History  Procedure Date  . Pacemaker insertion 2002, 2010    PPM 2002, upgrade to CRT-P by Dr Johney Frame (MDT 2010)  . Partial colectomy     History   Social History  . Marital Status: Married    Spouse Name: N/A    Number of Children: N/A  . Years of Education: N/A   Occupational History  . Not on file.   Social History Main Topics  . Smoking status: Never Smoker   . Smokeless tobacco: Not on file  . Alcohol Use: No  . Drug Use: No  . Sexually Active: Not on file   Other Topics  Concern  . Not on file   Social History Narrative   Lives in [place]     Allergies  Allergen Reactions  . Beta Adrenergic Blockers Other (See Comments)    Not working well for pt.  . Bystolic (Nebivolol Hcl) Shortness Of Breath  . Carvedilol Shortness Of Breath    Chest  Discomfort.  . Dapsone Other (See Comments)    Kidney  failure  . Diclofenac Sodium Other (See Comments)    Causes  Fluid  Retention.  Consuelo Pandy (Pitavastatin Calcium) Shortness Of Breath  . Methotrexate Shortness Of Breath    Mouth sores.  . Metoprolol Succinate Hives  . Oxaprozin Diarrhea  . Other     Fragrance mixtures -  Cinol botanicals  &  Plants oil. Cocamidoproy   -   Betaine. Lipstick & other makeup & cleaning materials.     Current Outpatient Prescriptions  Medication Sig Dispense Refill  . amiodarone (PACERONE) 200 MG tablet Take 200 mg by mouth daily.        Marland Kitchen aspirin 325 MG tablet Take 325 mg by mouth daily.        . Biotin 5 MG CAPS Take 5 mg by mouth daily.        . Calcium Carbonate (CALTRATE 600 PO) Take 1 tablet by mouth daily.        . Cholecalciferol (VITAMIN D3 PO) Take 1,000 Units  by mouth daily.        Marland Kitchen estrogens, conjugated, (PREMARIN) 0.3 MG tablet Take 0.3 mg by mouth daily. Take daily for 21 days then do not take for 7 days.       Marland Kitchen ezetimibe (ZETIA) 10 MG tablet Take 10 mg by mouth daily.        Marland Kitchen FLUTICASONE PROPIONATE, NASAL, NA Place 2 sprays into the nose as needed. 50  MCG/ACT.       Marland Kitchen Fluticasone-Salmeterol (ADVAIR) 100-50 MCG/DOSE AEPB Inhale 1 puff into the lungs as needed.        . folic acid (FOLVITE) 1 MG tablet Take 1 mg by mouth daily.        . furosemide (LASIX) 40 MG tablet Take 40 mg by mouth daily.        Marland Kitchen levothyroxine (SYNTHROID, LEVOTHROID) 75 MCG tablet Take 75 mcg by mouth daily.        Marland Kitchen loratadine (CLARITIN) 10 MG tablet Take 10 mg by mouth daily.        . metroNIDAZOLE (METROGEL) 0.75 % gel Apply 1 application topically 2 (two) times daily.          . Misc Natural Products (OSTEO BI-FLEX ADV DOUBLE ST) CAPS Take 1 capsule by mouth daily.        . montelukast (SINGULAIR) 10 MG tablet Take 10 mg by mouth at bedtime.        . naproxen sodium (ANAPROX) 220 MG tablet Take 220 mg by mouth every 12 (twelve) hours.      . polyethylene glycol (MIRALAX / GLYCOLAX) packet Take 17 g by mouth every other day.        . ramipril (ALTACE) 2.5 MG capsule Take 2.5 mg by mouth daily.        . simvastatin (ZOCOR) 20 MG tablet Take 20 mg by mouth at bedtime.        Marland Kitchen spironolactone (ALDACTONE) 25 MG tablet Take 25 mg by mouth as directed. Take 1 tab on  M,  W,  And   F.       . vitamin C (ASCORBIC ACID) 500 MG tablet Take 500 mg by mouth daily.          ROS- all systems are reviewed and negative except as per HPI  Physical Exam: Filed Vitals:   08/27/11 1245  BP: 130/64  Pulse: 74  Height: 5' 3.5" (1.613 m)  Weight: 166 lb 6.4 oz (75.479 kg)    GEN- The patient is very anxious appearing, alert and oriented x 3 today.   Head- normocephalic, atraumatic Eyes-  Sclera clear, conjunctiva pink Ears- hearing intact Oropharynx- clear Neck- supple,  Lungs- Clear to ausculation bilaterally, normal work of breathing Chest- pacemaker pocket is well healed Heart- Regular rate and rhythm, no murmurs, rubs or gallops, PMI not laterally displaced GI- soft, NT, ND, + BS Extremities- no clubbing, cyanosis, or edema MS- no significant deformity or atrophy Skin- no rash or lesion Psych- anxious Neuro- strength and sensation are intact  Pacemaker interrogation- reviewed in detail today,  See PACEART report EKG today reveals biv pacing  Assessment and Plan:

## 2011-08-28 NOTE — Telephone Encounter (Signed)
Follow-up:    Patient called to find out about her paper work.  Please call when her paperwork is ready.

## 2011-08-28 NOTE — Telephone Encounter (Signed)
Called pt in regards to paperwork being ready. Left message letting pt know paperwork was ready. [name]

## 2011-08-28 NOTE — Assessment & Plan Note (Signed)
Normal biventricular pacemaker function today.  She is >99% biV paced and all lead measurements are consistent with prior values.  Her EKG today reveals very nice narrowing of her QRS. I have compared her interrogation today with the interrogation from her last visit to my office (05/2009).  The only adjustment that I see is that her LV pacing output has changed from 5V @0 . to 5V@1 . .  This should have caused her no symptoms.  Given her significant anxiety over the issue, I felt that the most appropriate step was to return her device to the prior settings. She is therefore reprogrammed to 5V@0 . (as she was previously programmed by me 05/2009).  I hope that her symptoms resolve with this change.  See Arita Miss Art report  She wishes to continue to have her device followed by Dr [facility]. I will therefore see her as needed.  She will contact my office if I can assist further.

## 2011-09-01 ENCOUNTER — Telehealth: Payer: Self-pay | Admitting: Internal Medicine

## 2011-09-01 NOTE — Telephone Encounter (Signed)
Reviewed with Dr. Johney Frame and pt should follow up with Dr. Katrinka Blazing and device clinic in his office.  I called and gave pt this information.  I offered pt first available appt with Dr. Johney Frame later this summer but she did not want to schedule at this time.

## 2011-09-01 NOTE — Telephone Encounter (Signed)
Spoke with pt who reports she has been having problems since the first week of June. She states she saw Dr. Johney Frame on August 27, 2011 and pacemaker changes were made but she continues to have problems. She reports ongoing dizziness and shortness of breath. This has not changed since office visit on June 26. Also states she has uncomfortable feeling in upper part of back. Pain is present all the time and she has had it for a couple of weeks.  She would like to be seen by Dr. Johney Frame. I told pt I would review with Dr. Johney Frame and call her back

## 2011-09-01 NOTE — Telephone Encounter (Signed)
Pt till having SOB and dizziness , pls advise, denies chest pain , pressure, pain in back between shoulder blades, [phone] had device checked 6-26

## 2011-09-01 NOTE — Telephone Encounter (Signed)
Left message to call back  

## 2011-09-02 ENCOUNTER — Encounter: Payer: Self-pay | Admitting: Internal Medicine

## 2011-09-03 NOTE — Addendum Note (Signed)
Addended by: Early Chars on: 09/03/2011 09:59 AM   Modules accepted: Orders

## 2012-04-10 ENCOUNTER — Emergency Department (HOSPITAL_COMMUNITY)
Admission: EM | Admit: 2012-04-10 | Discharge: 2012-04-11 | Disposition: A | Discharge status: Home / Self Care | Payer: Medicare Other | Attending: Emergency Medicine | Admitting: Emergency Medicine

## 2012-04-10 ENCOUNTER — Encounter (HOSPITAL_COMMUNITY): Payer: Self-pay | Admitting: Emergency Medicine

## 2012-04-10 ENCOUNTER — Emergency Department (HOSPITAL_COMMUNITY): Payer: Medicare Other

## 2012-04-10 DIAGNOSIS — Z8679 Personal history of other diseases of the circulatory system: Secondary | ICD-10-CM | POA: Insufficient documentation

## 2012-04-10 DIAGNOSIS — J069 Acute upper respiratory infection, unspecified: Secondary | ICD-10-CM | POA: Insufficient documentation

## 2012-04-10 DIAGNOSIS — R51 Headache: Secondary | ICD-10-CM | POA: Insufficient documentation

## 2012-04-10 DIAGNOSIS — E785 Hyperlipidemia, unspecified: Secondary | ICD-10-CM | POA: Insufficient documentation

## 2012-04-10 DIAGNOSIS — J029 Acute pharyngitis, unspecified: Secondary | ICD-10-CM | POA: Insufficient documentation

## 2012-04-10 DIAGNOSIS — R0602 Shortness of breath: Secondary | ICD-10-CM | POA: Insufficient documentation

## 2012-04-10 DIAGNOSIS — Z7982 Long term (current) use of aspirin: Secondary | ICD-10-CM | POA: Insufficient documentation

## 2012-04-10 DIAGNOSIS — I10 Essential (primary) hypertension: Secondary | ICD-10-CM | POA: Insufficient documentation

## 2012-04-10 DIAGNOSIS — R509 Fever, unspecified: Secondary | ICD-10-CM | POA: Insufficient documentation

## 2012-04-10 DIAGNOSIS — Z79899 Other long term (current) drug therapy: Secondary | ICD-10-CM | POA: Insufficient documentation

## 2012-04-10 DIAGNOSIS — E039 Hypothyroidism, unspecified: Secondary | ICD-10-CM | POA: Insufficient documentation

## 2012-04-10 DIAGNOSIS — J3489 Other specified disorders of nose and nasal sinuses: Secondary | ICD-10-CM | POA: Insufficient documentation

## 2012-04-10 LAB — CBC
HCT: 44.3 % (ref 36.0–46.0)
MCV: 92.1 fL (ref 78.0–100.0)
Platelets: DECREASED 10*3/uL (ref 150–400)
RBC: 4.81 MIL/uL (ref 3.87–5.11)
RDW: 14.9 % (ref 11.5–15.5)
WBC: 10.7 10*3/uL — ABNORMAL HIGH (ref 4.0–10.5)

## 2012-04-10 LAB — BASIC METABOLIC PANEL
CO2: 25 mEq/L (ref 19–32)
Chloride: 101 mEq/L (ref 96–112)
Creatinine, Ser: 1.56 mg/dL — ABNORMAL HIGH (ref 0.50–1.10)
GFR calc Af Amer: 36 mL/min — ABNORMAL LOW (ref >90)
Potassium: 4.3 mEq/L (ref 3.5–5.1)

## 2012-04-10 LAB — PRO B NATRIURETIC PEPTIDE: Pro B Natriuretic peptide (BNP): 239.4 pg/mL (ref 0–450)

## 2012-04-10 MED ORDER — IBUPROFEN 400 MG PO TABS
400.0000 mg | ORAL_TABLET | Freq: Once | ORAL | Status: AC
  Administered 2012-04-10: 400 mg via ORAL
  Filled 2012-04-10: qty 1

## 2012-04-10 MED ORDER — AZITHROMYCIN 250 MG PO TABS: ORAL_TABLET | ORAL | Status: DC

## 2012-04-10 NOTE — ED Notes (Signed)
Pt c/o coughing, runny nose, shortness of breath and headache onset yesterday. Pt denies any other sick family members at present. Pt talking in complete sentences without difficulty.

## 2012-04-10 NOTE — ED Notes (Signed)
Pt came to the ED because she has been having nasal congestion and headache for two days. She stated that she has been having increased SOB. No chest pain. Pt has been having chills. No cough, but pt has been having a runny nose. Will continue to monitor.

## 2012-04-10 NOTE — ED Notes (Signed)
PA at bedside.

## 2012-04-10 NOTE — ED Provider Notes (Signed)
History     CSN: [id number]  Arrival date & time 04/10/12  1847   None     Chief Complaint  Patient presents with  . Cough  . Headache  . Shortness of Breath    (Consider location/radiation/quality/duration/timing/severity/associated sxs/prior treatment) HPI History provided by pt.   Pt developed cough productive of yellow-green sputum yesterday.  Associated w/ fever, sinus pressure, rhinorrhea, sore throat and exertional SOB.  Denies CP, vision changes, dizziness, extremity weakness/paresthesias, body aches, generalized weakness.  No known sick contacts.  H/o HTN, atrial fib, cardiomyopathy and complete heart block, for which she has a pacemaker.  Was seen at a walk-in clinic this morning and referred to ED d/t O2 sat of 92%.  Says she has been admitted for pneumonia twice, both times after being evaluated for cough and treated symptomatically for bronchitis.   Past Medical History  Diagnosis Date  . Hypothyroid   . Complete heart block 2002    Initial PPM 2002 with upgrade by JA to CRT-P 11/21/08  . Nonischemic cardiomyopathy   . Persistent atrial fibrillation   . Hypertension   . Hyperlipidemia     Past Surgical History  Procedure Laterality Date  . Pacemaker insertion  2002, 2010    PPM 2002, upgrade to CRT-P by Dr Johney Frame (MDT 2010)  . Partial colectomy    . Abdominal hysterectomy      No family history on file.  History  Substance Use Topics  . Smoking status: Never Smoker   . Smokeless tobacco: Not on file  . Alcohol Use: No    OB History   Grav Para Term Preterm Abortions TAB SAB Ect Mult Living                  Review of Systems  All other systems reviewed and are negative.    Allergies  Beta adrenergic blockers; Bystolic; Carvedilol; Dapsone; Diclofenac sodium; Livalo; Methotrexate; Metoprolol succinate; Oxaprozin; and Other  Home Medications   Current Outpatient Rx  Name  Route  Sig  Dispense  Refill  . amiodarone (PACERONE) 200 MG tablet    Oral   Take 200 mg by mouth daily.           Marland Kitchen aspirin 325 MG tablet   Oral   Take 325 mg by mouth daily.           . Biotin 5 MG CAPS   Oral   Take 5 mg by mouth daily.           . Calcium Carbonate (CALTRATE 600 PO)   Oral   Take 1 tablet by mouth daily.           . Cholecalciferol (VITAMIN D3 PO)   Oral   Take 1,000 Units by mouth daily.           Marland Kitchen estrogens, conjugated, (PREMARIN) 0.3 MG tablet   Oral   Take 0.3 mg by mouth daily. Take daily for 21 days then do not take for 7 days.          Marland Kitchen ezetimibe (ZETIA) 10 MG tablet   Oral   Take 10 mg by mouth daily.           . folic acid (FOLVITE) 1 MG tablet   Oral   Take 1 mg by mouth daily.           . furosemide (LASIX) 40 MG tablet   Oral   Take 40 mg by mouth daily.           Marland Kitchen  levothyroxine (SYNTHROID, LEVOTHROID) 75 MCG tablet   Oral   Take 75 mcg by mouth daily.           Marland Kitchen loratadine (CLARITIN) 10 MG tablet   Oral   Take 10 mg by mouth daily.           . Misc Natural Products (OSTEO BI-FLEX ADV DOUBLE ST) CAPS   Oral   Take 1 capsule by mouth daily.           . montelukast (SINGULAIR) 10 MG tablet   Oral   Take 10 mg by mouth at bedtime.           . naproxen sodium (ANAPROX) 220 MG tablet   Oral   Take 220 mg by mouth every 12 (twelve) hours.         . polyethylene glycol (MIRALAX / GLYCOLAX) packet   Oral   Take 17 g by mouth every other day.           . ramipril (ALTACE) 2.5 MG capsule   Oral   Take 2.5 mg by mouth daily.           . simvastatin (ZOCOR) 20 MG tablet   Oral   Take 20 mg by mouth at bedtime.           Marland Kitchen spironolactone (ALDACTONE) 25 MG tablet   Oral   Take 25 mg by mouth as directed. Take 1 tab on  M,  W,  And   F.          . vitamin C (ASCORBIC ACID) 500 MG tablet   Oral   Take 500 mg by mouth daily.             BP 138/44  Pulse 68  Temp(Src) 101.8 F (38.8 C) (Oral)  Resp 26  Wt 154 lb (69.854 kg)  BMI 26.85 kg/m2  SpO2  100%  Physical Exam  Nursing note and vitals reviewed. Constitutional: She is oriented to person, place, and time. She appears well-developed and well-nourished. No distress.  HENT:  Head: Normocephalic and atraumatic.  Mild erythema soft palate and posterior pharynx.  Tonsils w/out edema or exudate.  Tenderness of maxillary sinuses.    Eyes:  Normal appearance  Neck: Normal range of motion.  Cardiovascular: Normal rate and regular rhythm.   Pulmonary/Chest: Effort normal and breath sounds normal. No respiratory distress.  Productive cough  Musculoskeletal: Normal range of motion.  No LE edema/ttp  Lymphadenopathy:    She has no cervical adenopathy.  Neurological: She is alert and oriented to person, place, and time.  CN 3-12 intact.  No sensory deficits.  5/5 and equal upper and lower extremity strength.  No past pointing.   Skin: Skin is warm. No rash noted.  diaphoretic  Psychiatric: She has a normal mood and affect. Her behavior is normal.    ED Course  Procedures (including critical care time)  Labs Reviewed  CBC - Abnormal; Notable for the following:    WBC 10.7 (*)    All other components within normal limits  BASIC METABOLIC PANEL - Abnormal; Notable for the following:    Glucose, Bld 118 (*)    Creatinine, Ser 1.56 (*)    GFR calc non Af Amer 31 (*)    GFR calc Af Amer 36 (*)    All other components within normal limits  PRO B NATRIURETIC PEPTIDE   Dg Chest 2 View  04/10/2012  *RADIOLOGY REPORT*  Clinical Data: Short of  breath.  Cough and fever  CHEST - 2 VIEW  Comparison: 11/24/2011  Findings: Pacemaker unchanged.  Normal heart size.  Negative for heart failure.  Mild scarring in the left lung base is unchanged. Negative for pneumonia.  Negative for heart failure or effusion. Apical pleural scarring bilaterally.  IMPRESSION: No acute abnormality and no change from the prior study.   Original Report Authenticated By: Janeece Riggers, M.D.      1. Viral URI        MDM  79yo immuncompetent F presents w/ URI sx, including productive cough and exertional SOB since yesterday (no SOB prior to yesterday and denies CP).  Was referred by walk-in clinic d/t hypoxia.  Pt febrile, VS, including O2 sat, otherwise in nml range, non-toxic appearing, coughing but no respiratory distress and breath sounds nml, no fluid retention.  Labs unremarkable and CXR stable.  Suspect viral URI.  Ambulated and pt did not become dyspneic or drop her O2 sat.  She is demanding a antibiotics, despite discussion of risk vs. Benefit.  D/c'd home w/ zpak and referral back to her PCP.  Return precautions discussed. 11:46 PM         Otilio Miu, PA-C 04/10/12 2349

## 2012-04-10 NOTE — ED Notes (Signed)
PA made aware that pt is still waiting and elevated temperature. Discussed with pt and family the delay. Will continue to monitor.

## 2012-04-10 NOTE — ED Notes (Signed)
Ambulated pt as ordered. Oxygen Saturation remained between 93-96%. No SOB noted. Pt stated that she felt okay ambulating. PA made aware.

## 2012-04-11 NOTE — ED Provider Notes (Signed)
Medical screening examination/treatment/procedure(s) were performed by non-physician practitioner and as supervising physician I was immediately available for consultation/collaboration.  Sunnie Nielsen, MD 04/11/12 [phone]

## 2012-04-26 ENCOUNTER — Other Ambulatory Visit: Payer: Self-pay | Admitting: Family Medicine

## 2012-04-26 DIAGNOSIS — Z1231 Encounter for screening mammogram for malignant neoplasm of breast: Secondary | ICD-10-CM

## 2012-06-01 ENCOUNTER — Ambulatory Visit
Admission: RE | Admit: 2012-06-01 | Discharge: 2012-06-01 | Disposition: A | Discharge status: Home / Self Care | Payer: Medicare Other | Source: Ambulatory Visit | Attending: Family Medicine | Admitting: Family Medicine

## 2012-06-01 DIAGNOSIS — Z1231 Encounter for screening mammogram for malignant neoplasm of breast: Secondary | ICD-10-CM

## 2012-10-13 ENCOUNTER — Other Ambulatory Visit: Payer: Self-pay | Admitting: Obstetrics and Gynecology

## 2012-10-13 NOTE — Telephone Encounter (Signed)
eScribe request for refill on ESTRACE Last filled - 04/09/12, #30 thru [facility] Last AEX - 10/13/11 Next AEX - 11/04/12 Please advise refills.  Paper chart on your desk.

## 2012-11-03 ENCOUNTER — Encounter: Payer: Self-pay | Admitting: Obstetrics & Gynecology

## 2012-11-04 ENCOUNTER — Encounter: Payer: Self-pay | Admitting: Obstetrics & Gynecology

## 2012-11-04 ENCOUNTER — Ambulatory Visit (INDEPENDENT_AMBULATORY_CARE_PROVIDER_SITE_OTHER): Payer: Medicare Other | Admitting: Obstetrics & Gynecology

## 2012-11-04 VITALS — BP 132/60 | HR 68 | Resp 16 | Ht 62.5 in | Wt 161.8 lb

## 2012-11-04 DIAGNOSIS — Z01419 Encounter for gynecological examination (general) (routine) without abnormal findings: Secondary | ICD-10-CM

## 2012-11-04 DIAGNOSIS — Z124 Encounter for screening for malignant neoplasm of cervix: Secondary | ICD-10-CM

## 2012-11-04 MED ORDER — ESTRADIOL 0.5 MG PO TABS: ORAL_TABLET | ORAL | Status: DC

## 2012-11-04 NOTE — Progress Notes (Signed)
79 y.o. [id number] MarriedCaucasianF here for annual exam.  No vaginal bleeding.  Reports she just had a lesion on her scalp removed one week ago.  Was in ER in February.  Lab work showed she is now stage 3 renal insufficieny.  Did recently see nephrologist.  Had ultrasound that looked normal.    Patient's last menstrual period was 03/03/1985.          Sexually active: no  The current method of family planning is status post hysterectomy.    Exercising: no  not regularly Smoker:  no  Health Maintenance: Pap:  12/17/00 WNL History of abnormal Pap:  no MMG:  06/01/12 normal Colonoscopy:  5/11 repeat in 5 years,  BMD:   2013, ordered by PCP TDaP:  2008 Screening Labs: PCP, Hb today: PCP, Urine today: PCP   reports that she has never smoked. She has never used smokeless tobacco. She reports that she does not drink alcohol or use illicit drugs.  Past Medical History  Diagnosis Date  . Hypothyroid   . Complete heart block 2002    Initial PPM 2002 with upgrade by JA to CRT-P 11/21/08  . Nonischemic cardiomyopathy   . Persistent atrial fibrillation   . Hypertension   . Hyperlipidemia   . Hives 4/10  . Cataract 4/13    right  . Renal failure     3rd stage-seeing neurologist    Past Surgical History  Procedure Laterality Date  . Pacemaker insertion  2002, 2010    PPM 2002, upgrade to CRT-P by Dr Johney Frame (MDT 2010)  . Partial colectomy    . Abdominal hysterectomy      TAH  . Bunionectomy Right 1996  . Breast biopsy Left 01/1997  . Colectomy  03/2002  . Cataract extraction      both eyes  . Cyst excision      on back of left ear    Current Outpatient Prescriptions  Medication Sig Dispense Refill  . amiodarone (PACERONE) 200 MG tablet Take 200 mg by mouth daily.        Marland Kitchen aspirin 325 MG tablet Take 325 mg by mouth daily.        . Biotin 5 MG CAPS Take 5 mg by mouth daily.        . Calcium Carbonate (CALTRATE 600 PO) Take 1 tablet by mouth daily.        . Cholecalciferol (VITAMIN D3  PO) Take 1,000 Units by mouth daily.        Marland Kitchen estradiol (ESTRACE) 0.5 MG tablet TAKE 1 TABLET BY MOUTH ONCE DAILY  90 tablet  1  . folic acid (FOLVITE) 1 MG tablet Take 1 mg by mouth daily.        . furosemide (LASIX) 40 MG tablet Take 40 mg by mouth daily.        Marland Kitchen levothyroxine (SYNTHROID, LEVOTHROID) 75 MCG tablet Take 75 mcg by mouth daily.        Marland Kitchen loratadine (CLARITIN) 10 MG tablet Take 10 mg by mouth daily.        . Misc Natural Products (OSTEO BI-FLEX ADV DOUBLE ST) CAPS Take 1 capsule by mouth daily.        . montelukast (SINGULAIR) 10 MG tablet Take 10 mg by mouth at bedtime.        . polyethylene glycol (MIRALAX / GLYCOLAX) packet Take 17 g by mouth every other day.        . ramipril (ALTACE) 2.5 MG capsule Take  2.5 mg by mouth daily.        . simvastatin (ZOCOR) 20 MG tablet Take 20 mg by mouth at bedtime.        Marland Kitchen spironolactone (ALDACTONE) 25 MG tablet Take 25 mg by mouth as directed. Take 1 tab on  M,  W,  And   F.       . vitamin C (ASCORBIC ACID) 500 MG tablet Take 500 mg by mouth daily.        . naproxen sodium (ANAPROX) 220 MG tablet Take 220 mg by mouth every 12 (twelve) hours.       No current facility-administered medications for this visit.    Family History  Problem Relation Age of Onset  . Diabetes Maternal Aunt   . Breast cancer Maternal Aunt   . Stroke Mother   . Stroke Father   . Hypothyroidism Sister   . Hypothyroidism Son   . Hypothyroidism Mother     ROS:  Pertinent items are noted in HPI.  Otherwise, a comprehensive ROS was negative.  Exam:   BP 132/60  Pulse 68  Resp 16  Ht 5' 2.5" (1.588 m)  Wt 161 lb 12.8 oz (73.392 kg)  BMI 29.1 kg/m2  LMP 03/03/1985  Weight change: -3lb   Height: 5' 2.5" (158.8 cm)  Ht Readings from Last 3 Encounters:  11/04/12 5' 2.5" (1.588 m)  08/27/11 5' 3.5" (1.613 m)  03/05/11 5\' 3"  (1.6 m)    General appearance: alert, cooperative and appears stated age Head: Normocephalic, without obvious abnormality,  atraumatic Neck: no adenopathy, supple, symmetrical, trachea midline and thyroid normal to inspection and palpation Lungs: clear to auscultation bilaterally Breasts: normal appearance, no masses or tenderness Heart: regular rate and rhythm Abdomen: soft, non-tender; bowel sounds normal; no masses,  no organomegaly Extremities: extremities normal, atraumatic, no cyanosis or edema Skin: Skin color, texture, turgor normal. No rashes or lesions Lymph nodes: Cervical, supraclavicular, and axillary nodes normal. No abnormal inguinal nodes palpated Neurologic: Grossly normal   Pelvic: External genitalia:  no lesions              Urethra:  normal appearing urethra with no masses, tenderness or lesions              Bartholins and Skenes: normal                 Vagina: normal appearing vagina with normal color and discharge, no lesions              Cervix: absent              Pap taken: no Bimanual Exam:  Uterus:  uterus absent              Adnexa: no mass, fullness, tenderness               Rectovaginal: Confirms               Anus:  normal sphincter tone, no lesions  A:  Well Woman with normal exam Pacemaker 2002, 2010 Hypothyroidism H/o ovarian cysts, stable over many years  P:   Mammogram yearly pap smear not indicated due to hysterectomy H/O TAH due to fibroids H/O ovairan cysts and hydrosalpinx, last ultrasound/ca-125 4/10.   Estradiol 0.5mg .  She will try taking it at night due to hot flashes between 3-4am.  Rx to pharmacy. return annually or prn  An After Visit Summary was printed and given to the patient.

## 2012-11-04 NOTE — Patient Instructions (Signed)

## 2012-12-02 ENCOUNTER — Telehealth: Payer: Self-pay | Admitting: Obstetrics & Gynecology

## 2012-12-02 NOTE — Telephone Encounter (Signed)
Called patient.  Estradiol is "not doing me any good". Would like to go back on premarin 0.3 mg. Would like to be on premarin despite cost.  Requesting Rx to [facility] at [address].  [phone]

## 2012-12-02 NOTE — Telephone Encounter (Signed)
Patient is returning a call to Tracy. °

## 2012-12-02 NOTE — Telephone Encounter (Signed)
Patient has a request for Dr. Rondel Baton nurse. (no details given)

## 2012-12-02 NOTE — Telephone Encounter (Signed)
Message left to return call to Breckyn Troyer at [phone].    

## 2012-12-03 MED ORDER — ESTROGENS CONJUGATED 0.3 MG PO TABS: 0.3000 mg | ORAL_TABLET | Freq: Every day | ORAL | Status: DC

## 2012-12-03 NOTE — Telephone Encounter (Signed)
Let her know ma be cheaper if she calls around and checks a few places but rx sent to [facility] on [address].

## 2012-12-03 NOTE — Telephone Encounter (Signed)
rx sent to [facility].

## 2012-12-03 NOTE — Telephone Encounter (Signed)
Message left to return call to Johnny Gorter at [phone].    

## 2012-12-03 NOTE — Telephone Encounter (Signed)
Attempted to call, phone rings busy signal. Will try again.

## 2012-12-07 NOTE — Telephone Encounter (Signed)
Message left to return call to Beryl Hornberger at [phone].    

## 2013-01-06 ENCOUNTER — Other Ambulatory Visit: Payer: Self-pay

## 2013-01-13 ENCOUNTER — Other Ambulatory Visit: Payer: Self-pay | Admitting: Family Medicine

## 2013-01-15 ENCOUNTER — Other Ambulatory Visit: Payer: Self-pay | Admitting: Interventional Cardiology

## 2013-02-22 ENCOUNTER — Encounter: Payer: Self-pay | Admitting: Internal Medicine

## 2013-03-02 ENCOUNTER — Telehealth: Payer: Self-pay | Admitting: Internal Medicine

## 2013-03-02 NOTE — Telephone Encounter (Signed)
Called pt to fu on message left 12-4, and letter mailed 12-23, for her to follow up on her pacemaker, right now she is following dr Orson Aloe at [facility], he is leaving and she will then follow his replacement for the time being, would like too come back here as she lives in [place], will call at that time/mt

## 2013-03-25 ENCOUNTER — Other Ambulatory Visit: Payer: Self-pay | Admitting: Interventional Cardiology

## 2013-04-04 ENCOUNTER — Encounter (HOSPITAL_COMMUNITY): Payer: Self-pay | Admitting: Emergency Medicine

## 2013-04-04 ENCOUNTER — Inpatient Hospital Stay (HOSPITAL_COMMUNITY)
Admission: EM | Admit: 2013-04-04 | Discharge: 2013-04-22 | DRG: 004 | Disposition: A | Discharge status: Other Acute Inpatient Hospital | Payer: Medicare Other | Attending: Emergency Medicine | Admitting: Emergency Medicine

## 2013-04-04 ENCOUNTER — Emergency Department (HOSPITAL_COMMUNITY): Payer: Medicare Other

## 2013-04-04 DIAGNOSIS — M109 Gout, unspecified: Secondary | ICD-10-CM | POA: Diagnosis present

## 2013-04-04 DIAGNOSIS — H269 Unspecified cataract: Secondary | ICD-10-CM | POA: Diagnosis present

## 2013-04-04 DIAGNOSIS — Z9049 Acquired absence of other specified parts of digestive tract: Secondary | ICD-10-CM

## 2013-04-04 DIAGNOSIS — N183 Chronic kidney disease, stage 3 unspecified: Secondary | ICD-10-CM | POA: Diagnosis present

## 2013-04-04 DIAGNOSIS — J95811 Postprocedural pneumothorax: Secondary | ICD-10-CM | POA: Diagnosis present

## 2013-04-04 DIAGNOSIS — I959 Hypotension, unspecified: Secondary | ICD-10-CM

## 2013-04-04 DIAGNOSIS — Z8249 Family history of ischemic heart disease and other diseases of the circulatory system: Secondary | ICD-10-CM

## 2013-04-04 DIAGNOSIS — J9690 Respiratory failure, unspecified, unspecified whether with hypoxia or hypercapnia: Secondary | ICD-10-CM

## 2013-04-04 DIAGNOSIS — N189 Chronic kidney disease, unspecified: Secondary | ICD-10-CM

## 2013-04-04 DIAGNOSIS — I4891 Unspecified atrial fibrillation: Secondary | ICD-10-CM

## 2013-04-04 DIAGNOSIS — R700 Elevated erythrocyte sedimentation rate: Secondary | ICD-10-CM | POA: Diagnosis present

## 2013-04-04 DIAGNOSIS — J129 Viral pneumonia, unspecified: Secondary | ICD-10-CM

## 2013-04-04 DIAGNOSIS — R404 Transient alteration of awareness: Secondary | ICD-10-CM | POA: Diagnosis present

## 2013-04-04 DIAGNOSIS — R092 Respiratory arrest: Secondary | ICD-10-CM | POA: Diagnosis not present

## 2013-04-04 DIAGNOSIS — R7309 Other abnormal glucose: Secondary | ICD-10-CM | POA: Diagnosis present

## 2013-04-04 DIAGNOSIS — J9620 Acute and chronic respiratory failure, unspecified whether with hypoxia or hypercapnia: Secondary | ICD-10-CM | POA: Diagnosis present

## 2013-04-04 DIAGNOSIS — J939 Pneumothorax, unspecified: Secondary | ICD-10-CM

## 2013-04-04 DIAGNOSIS — J984 Other disorders of lung: Secondary | ICD-10-CM

## 2013-04-04 DIAGNOSIS — I5040 Unspecified combined systolic (congestive) and diastolic (congestive) heart failure: Secondary | ICD-10-CM | POA: Diagnosis present

## 2013-04-04 DIAGNOSIS — Z515 Encounter for palliative care: Secondary | ICD-10-CM

## 2013-04-04 DIAGNOSIS — I442 Atrioventricular block, complete: Secondary | ICD-10-CM

## 2013-04-04 DIAGNOSIS — R55 Syncope and collapse: Secondary | ICD-10-CM

## 2013-04-04 DIAGNOSIS — A419 Sepsis, unspecified organism: Principal | ICD-10-CM

## 2013-04-04 DIAGNOSIS — G6281 Critical illness polyneuropathy: Secondary | ICD-10-CM | POA: Diagnosis present

## 2013-04-04 DIAGNOSIS — Z7982 Long term (current) use of aspirin: Secondary | ICD-10-CM

## 2013-04-04 DIAGNOSIS — I129 Hypertensive chronic kidney disease with stage 1 through stage 4 chronic kidney disease, or unspecified chronic kidney disease: Secondary | ICD-10-CM | POA: Diagnosis present

## 2013-04-04 DIAGNOSIS — J189 Pneumonia, unspecified organism: Secondary | ICD-10-CM

## 2013-04-04 DIAGNOSIS — I428 Other cardiomyopathies: Secondary | ICD-10-CM

## 2013-04-04 DIAGNOSIS — E785 Hyperlipidemia, unspecified: Secondary | ICD-10-CM | POA: Diagnosis present

## 2013-04-04 DIAGNOSIS — N179 Acute kidney failure, unspecified: Secondary | ICD-10-CM

## 2013-04-04 DIAGNOSIS — R5381 Other malaise: Secondary | ICD-10-CM | POA: Diagnosis present

## 2013-04-04 DIAGNOSIS — I469 Cardiac arrest, cause unspecified: Secondary | ICD-10-CM

## 2013-04-04 DIAGNOSIS — E46 Unspecified protein-calorie malnutrition: Secondary | ICD-10-CM | POA: Diagnosis present

## 2013-04-04 DIAGNOSIS — R6520 Severe sepsis without septic shock: Secondary | ICD-10-CM

## 2013-04-04 DIAGNOSIS — E870 Hyperosmolality and hypernatremia: Secondary | ICD-10-CM | POA: Diagnosis present

## 2013-04-04 DIAGNOSIS — R0902 Hypoxemia: Secondary | ICD-10-CM

## 2013-04-04 DIAGNOSIS — R578 Other shock: Secondary | ICD-10-CM | POA: Diagnosis present

## 2013-04-04 DIAGNOSIS — R410 Disorientation, unspecified: Secondary | ICD-10-CM

## 2013-04-04 DIAGNOSIS — R609 Edema, unspecified: Secondary | ICD-10-CM | POA: Diagnosis present

## 2013-04-04 DIAGNOSIS — Z79899 Other long term (current) drug therapy: Secondary | ICD-10-CM

## 2013-04-04 DIAGNOSIS — J80 Acute respiratory distress syndrome: Secondary | ICD-10-CM

## 2013-04-04 DIAGNOSIS — A0811 Acute gastroenteropathy due to Norwalk agent: Secondary | ICD-10-CM | POA: Diagnosis present

## 2013-04-04 DIAGNOSIS — D696 Thrombocytopenia, unspecified: Secondary | ICD-10-CM | POA: Diagnosis present

## 2013-04-04 DIAGNOSIS — T797XXA Traumatic subcutaneous emphysema, initial encounter: Secondary | ICD-10-CM

## 2013-04-04 DIAGNOSIS — Z950 Presence of cardiac pacemaker: Secondary | ICD-10-CM

## 2013-04-04 DIAGNOSIS — I10 Essential (primary) hypertension: Secondary | ICD-10-CM

## 2013-04-04 DIAGNOSIS — R6510 Systemic inflammatory response syndrome (SIRS) of non-infectious origin without acute organ dysfunction: Secondary | ICD-10-CM

## 2013-04-04 DIAGNOSIS — I5020 Unspecified systolic (congestive) heart failure: Secondary | ICD-10-CM

## 2013-04-04 DIAGNOSIS — R5383 Other fatigue: Secondary | ICD-10-CM

## 2013-04-04 DIAGNOSIS — E039 Hypothyroidism, unspecified: Secondary | ICD-10-CM

## 2013-04-04 DIAGNOSIS — B3749 Other urogenital candidiasis: Secondary | ICD-10-CM | POA: Diagnosis present

## 2013-04-04 DIAGNOSIS — T380X5A Adverse effect of glucocorticoids and synthetic analogues, initial encounter: Secondary | ICD-10-CM | POA: Diagnosis present

## 2013-04-04 DIAGNOSIS — I5042 Chronic combined systolic (congestive) and diastolic (congestive) heart failure: Secondary | ICD-10-CM | POA: Diagnosis present

## 2013-04-04 DIAGNOSIS — R6521 Severe sepsis with septic shock: Secondary | ICD-10-CM

## 2013-04-04 LAB — COMPREHENSIVE METABOLIC PANEL
ALBUMIN: 2.9 g/dL — AB (ref 3.5–5.2)
ALK PHOS: 69 U/L (ref 39–117)
ALT: 49 U/L — AB (ref 0–35)
AST: 58 U/L — ABNORMAL HIGH (ref 0–37)
BUN: 29 mg/dL — AB (ref 6–23)
CALCIUM: 8.1 mg/dL — AB (ref 8.4–10.5)
CO2: 15 mEq/L — ABNORMAL LOW (ref 19–32)
Chloride: 102 mEq/L (ref 96–112)
Creatinine, Ser: 1.81 mg/dL — ABNORMAL HIGH (ref 0.50–1.10)
GFR calc non Af Amer: 26 mL/min — ABNORMAL LOW (ref >90)
GFR, EST AFRICAN AMERICAN: 30 mL/min — AB (ref >90)
GLUCOSE: 142 mg/dL — AB (ref 70–99)
POTASSIUM: 3.3 meq/L — AB (ref 3.7–5.3)
SODIUM: 139 meq/L (ref 137–147)
TOTAL PROTEIN: 6.9 g/dL (ref 6.0–8.3)
Total Bilirubin: 0.6 mg/dL (ref 0.3–1.2)

## 2013-04-04 LAB — PRO B NATRIURETIC PEPTIDE: Pro B Natriuretic peptide (BNP): 379.5 pg/mL (ref 0–450)

## 2013-04-04 LAB — BLOOD GAS, ARTERIAL
Acid-base deficit: 8.1 mmol/L — ABNORMAL HIGH (ref 0.0–2.0)
Bicarbonate: 13.7 mEq/L — ABNORMAL LOW (ref 20.0–24.0)
DRAWN BY: 31814
FIO2: 1 %
O2 Saturation: 95.1 %
PATIENT TEMPERATURE: 98.6
PCO2 ART: 21.5 mmHg — AB (ref 35.0–45.0)
PH ART: 7.42 (ref 7.350–7.450)
TCO2: 11.4 mmol/L (ref 0–100)
pO2, Arterial: 106 mmHg — ABNORMAL HIGH (ref 80.0–100.0)

## 2013-04-04 LAB — LACTIC ACID, PLASMA: LACTIC ACID, VENOUS: 5.2 mmol/L — AB (ref 0.5–2.2)

## 2013-04-04 LAB — TROPONIN I

## 2013-04-04 MED ORDER — SODIUM CHLORIDE 0.9 % IV SOLN
1000.0000 mL | Freq: Once | INTRAVENOUS | Status: AC
  Administered 2013-04-04: 1000 mL via INTRAVENOUS

## 2013-04-04 MED ORDER — SODIUM CHLORIDE 0.9 % IV SOLN
1000.0000 mL | INTRAVENOUS | Status: DC
  Administered 2013-04-04: 1000 mL via INTRAVENOUS

## 2013-04-04 MED ORDER — DEXTROSE 5 % IV SOLN
500.0000 mg | Freq: Once | INTRAVENOUS | Status: AC
  Administered 2013-04-04: 500 mg via INTRAVENOUS

## 2013-04-04 MED ORDER — IPRATROPIUM-ALBUTEROL 0.5-2.5 (3) MG/3ML IN SOLN
3.0000 mL | Freq: Once | RESPIRATORY_TRACT | Status: AC
Start: 2013-04-04 — End: 2013-04-04
  Administered 2013-04-04: 3 mL via RESPIRATORY_TRACT
  Filled 2013-04-04: qty 3

## 2013-04-04 MED ORDER — OSELTAMIVIR PHOSPHATE 75 MG PO CAPS
75.0000 mg | ORAL_CAPSULE | Freq: Two times a day (BID) | ORAL | Status: DC
  Administered 2013-04-05: 75 mg via ORAL
  Filled 2013-04-04: qty 1

## 2013-04-04 MED ORDER — DEXTROSE 5 % IV SOLN
1.0000 g | Freq: Once | INTRAVENOUS | Status: DC
  Filled 2013-04-04: qty 10

## 2013-04-04 NOTE — ED Provider Notes (Signed)
CSN: [id number]     Arrival date & time 04/04/13  2101 History   First MD Initiated Contact with Patient 04/04/13 2107     Chief Complaint  Patient presents with  . Nausea  . Weakness  . Diarrhea    HPI Comments: Pt started having vomiting yesterday.  She thought she had food poisoning and continued to vomiting throughout the day.  She vomited at least a dozen times.  She stopped vomiting around 10 am but then started having diarrhea, multiple episodes.  As the day progressed she started feeling short of breath.  She also developed chills and fever.  Slight cough for the last few days.  She denies having any pain anywhere.  She did start to feel lightheaded today.  Patient is a 80 y.o. female presenting with GI illness.  GI Problem This is a new problem. The current episode started 12 to 24 hours ago. The problem occurs constantly. Pertinent negatives include no chest pain and no headaches. Nothing relieves the symptoms. The treatment provided no relief.    Past Medical History  Diagnosis Date  . Hypothyroid   . Complete heart block 2002    Initial PPM 2002 with upgrade by JA to CRT-P 11/21/08  . Nonischemic cardiomyopathy   . Persistent atrial fibrillation   . Hypertension   . Hyperlipidemia   . Hives 4/10  . Cataract 4/13    right  . Renal failure     3rd stage-seeing neurologist   Past Surgical History  Procedure Laterality Date  . Pacemaker insertion  2002, 2010    PPM 2002, upgrade to CRT-P by Dr Johney Frame (MDT 2010)  . Partial colectomy  1/04  . Abdominal hysterectomy  1987    TAH  . Bunionectomy Right 1996  . Breast biopsy Left 01/1997  . Cataract extraction  4/13    both eyes  . Cyst excision      on back of left ear   Family History  Problem Relation Age of Onset  . Diabetes Maternal Aunt   . Breast cancer Maternal Aunt   . Stroke Mother   . Stroke Father   . Hypothyroidism Sister   . Hypothyroidism Son   . Hypothyroidism Mother   . Hypertension Father   .  Kidney disease Father     renal problems   History  Substance Use Topics  . Smoking status: Never Smoker   . Smokeless tobacco: Never Used  . Alcohol Use: No   OB History   Grav Para Term Preterm Abortions TAB SAB Ect Mult Living   2 2 2       2      Review of Systems  Constitutional: Negative for fever.  Cardiovascular: Negative for chest pain.  Genitourinary: Negative for dysuria.  Neurological: Negative for headaches.  All other systems reviewed and are negative.    Allergies  Beta adrenergic blockers; Bystolic; Carvedilol; Dapsone; Diclofenac sodium; Livalo; Methotrexate; Metoprolol succinate; Oxaprozin; Other; and Epinephrine  Home Medications   Current Outpatient Rx  Name  Route  Sig  Dispense  Refill  . amiodarone (PACERONE) 200 MG tablet   Oral   Take 200 mg by mouth daily.         Marland Kitchen aspirin 325 MG tablet   Oral   Take 325 mg by mouth daily.           . Biotin 5 MG CAPS   Oral   Take 5 mg by mouth daily.           Marland Kitchen  Calcium Carbonate (CALTRATE 600 PO)   Oral   Take 1 tablet by mouth daily.           . Cholecalciferol (VITAMIN D3 PO)   Oral   Take 1,000 Units by mouth daily.           Marland Kitchen estrogens, conjugated, (PREMARIN) 0.3 MG tablet   Oral   Take 1 tablet (0.3 mg total) by mouth daily.   30 tablet   12   . fluticasone (FLONASE) 50 MCG/ACT nasal spray   Each Nare   Place 2 sprays into both nostrils as needed for allergies or rhinitis.         . folic acid (FOLVITE) 1 MG tablet   Oral   Take 1 mg by mouth daily.           . furosemide (LASIX) 40 MG tablet   Oral   Take 40 mg by mouth daily.           Marland Kitchen levothyroxine (SYNTHROID, LEVOTHROID) 75 MCG tablet   Oral   Take 75 mcg by mouth daily after breakfast.         . lisinopril (PRINIVIL,ZESTRIL) 10 MG tablet   Oral   Take 10 mg by mouth daily.         Marland Kitchen loratadine (CLARITIN) 10 MG tablet   Oral   Take 10 mg by mouth daily.           . metroNIDAZOLE (METROCREAM)  0.75 % cream   Topical   Apply 1 application topically as needed.         . Misc Natural Products (OSTEO BI-FLEX ADV DOUBLE ST) CAPS   Oral   Take 1 capsule by mouth daily.           . montelukast (SINGULAIR) 10 MG tablet   Oral   Take 10 mg by mouth every evening.          . naproxen sodium (ANAPROX) 220 MG tablet   Oral   Take 220 mg by mouth every 12 (twelve) hours.         . polyethylene glycol (MIRALAX / GLYCOLAX) packet   Oral   Take 17 g by mouth every other day.           . simvastatin (ZOCOR) 20 MG tablet   Oral   Take 20 mg by mouth at bedtime.           Marland Kitchen spironolactone (ALDACTONE) 25 MG tablet   Oral   Take 25 mg by mouth as directed. Take 1 tab on  M,  W,  And   F.          . vitamin C (ASCORBIC ACID) 500 MG tablet   Oral   Take 500 mg by mouth daily.            BP 99/53  Pulse 88  Temp(Src) 99.9 F (37.7 C) (Oral)  Resp 21  SpO2 94%  LMP 03/03/1985 Physical Exam  Nursing note and vitals reviewed. Constitutional: She appears distressed.  HENT:  Head: Normocephalic and atraumatic.  Right Ear: External ear normal.  Left Ear: External ear normal.  Eyes: Conjunctivae are normal. Right eye exhibits no discharge. Left eye exhibits no discharge. No scleral icterus.  Neck: Neck supple. No tracheal deviation present.  Cardiovascular: Normal rate, regular rhythm and intact distal pulses.   Pulmonary/Chest: Effort normal. No stridor. No respiratory distress. She has wheezes. She has rales.  Abdominal: Soft. Bowel sounds  are normal. She exhibits no distension. There is no tenderness. There is no rebound and no guarding.  Musculoskeletal: She exhibits no edema and no tenderness.  Neurological: She is alert. She has normal strength. No cranial nerve deficit (no facial droop, extraocular movements intact, no slurred speech) or sensory deficit. She exhibits normal muscle tone. She displays no seizure activity. Coordination normal.  Skin: Skin is warm  and dry. No rash noted. She is not diaphoretic. There is pallor.  Psychiatric: She has a normal mood and affect.    ED Course  Procedures (including critical care time)  20 gauge IV placed by left AC fossa using ultrasound guidance.  20 cc blood withdrawn for lab testing.  IV easily flushed. Pt tolerated well.  CRITICAL CARE Performed by: Celene KrasKNAPP,Thecla Forgione R Total critical care time: 40 Critical care time was exclusive of separately billable procedures and treating other patients. Critical care was necessary to treat or prevent imminent or life-threatening deterioration. Critical care was time spent personally by me on the following activities: development of treatment plan with patient and/or surrogate as well as nursing, discussions with consultants, evaluation of patient's response to treatment, examination of patient, obtaining history from patient or surrogate, ordering and performing treatments and interventions, ordering and review of laboratory studies, ordering and review of radiographic studies, pulse oximetry and re-evaluation of patient's condition.  Labs Review Labs Reviewed  BLOOD GAS, ARTERIAL - Abnormal; Notable for the following:    pCO2 arterial 21.5 (*)    pO2, Arterial 106.0 (*)    Bicarbonate 13.7 (*)    Acid-base deficit 8.1 (*)    All other components within normal limits  COMPREHENSIVE METABOLIC PANEL - Abnormal; Notable for the following:    Potassium 3.3 (*)    CO2 15 (*)    Glucose, Bld 142 (*)    BUN 29 (*)    Creatinine, Ser 1.81 (*)    Calcium 8.1 (*)    Albumin 2.9 (*)    AST 58 (*)    ALT 49 (*)    GFR calc non Af Amer 26 (*)    GFR calc Af Amer 30 (*)    All other components within normal limits  LACTIC ACID, PLASMA - Abnormal; Notable for the following:    Lactic Acid, Venous 5.2 (*)    All other components within normal limits  CBC - Abnormal; Notable for the following:    WBC 14.7 (*)    RBC 5.61 (*)    Hemoglobin 17.9 (*)    HCT 52.8 (*)     All other components within normal limits  CULTURE, BLOOD (ROUTINE X 2)  CULTURE, BLOOD (ROUTINE X 2)  PRO B NATRIURETIC PEPTIDE  TROPONIN I  DIFFERENTIAL  CBC WITH DIFFERENTIAL  INFLUENZA PANEL BY PCR (TYPE A & B, H1N1)   Imaging Review Dg Chest Port 1 View  04/04/2013   CLINICAL DATA:  Fever and shortness of breath.  EXAM: PORTABLE CHEST - 1 VIEW  COMPARISON:  11/22/2012.  FINDINGS: Normal sized heart. Stable minimal scarring at the left lung base. Interval mild prominence of the interstitial markings. No pleural fluid. Stable left subclavian pacer and AICD leads. Unremarkable bones.  IMPRESSION: Interval mild diffuse interstitial lung disease, suspicious for interstitial pneumonitis.   Electronically Signed   By: Gordan PaymentSteve  Reid M.D.   On: 04/04/2013 22:12    EKG Ventricular paced rhythm rate 84 No prior EKG for comparison *Muse/Epic link not available    Medications  0.9 %  sodium chloride infusion (1,000 mLs Intravenous New Bag/Given 04/04/13 2143)    Followed by  0.9 %  sodium chloride infusion (1,000 mLs Intravenous New Bag/Given 04/04/13 2324)  oseltamivir (TAMIFLU) capsule 75 mg (not administered)  cefTRIAXone (ROCEPHIN) 1 g in dextrose 5 % 50 mL IVPB (not administered)  azithromycin (ZITHROMAX) 500 mg in dextrose 5 % 250 mL IVPB (not administered)  ipratropium-albuterol (DUONEB) 0.5-2.5 (3) MG/3ML nebulizer solution 3 mL (not administered)   Peripheral IV placed by me for additional IV access.   2315  Updated patient and husband about findings.  Oxygen saturation has improved with supplemental oxygen.  Vital signs are stable although pt continues to feel short of breath.  Will start IV abx to cover cap although cxr is suggestive of an interstitial process.  ?influenza, ?legionella.  Pt treated with IV rocephin and azithromycin.     MDM   1. Pneumonitis   2. Hypoxia   3. SIRS (systemic inflammatory response syndrome)     Patient was treated aggressively in the ED with IV  fluids and antibiotics. Tamiflu was initiated for possible influenza. Doing that was given for her respiratory symptoms.  She will need close observation and monitoring considering her lactic acid level and hypoxia. In the ED she is currently maintaining her oxygen saturation and does not appear fatigued however  I am concerned about her appearance and the possibility that she may become fatigued.   Celene Kras, MD 04/04/13 [phone]

## 2013-04-04 NOTE — ED Notes (Signed)
Patient states she began to have diarrhea, nausea, and vomiting on yesterday that returned on today. She also complains of generalized weakness and that her legs felt "like water". Patient is now also complaining of shortness of breath since arrival.

## 2013-04-04 NOTE — ED Notes (Signed)
Bed: NO03 Expected date:  Expected time:  Means of arrival:  Comments: EMS 80yo F, abd pain N/V/D, weakness, hypotensive

## 2013-04-05 ENCOUNTER — Inpatient Hospital Stay (HOSPITAL_COMMUNITY): Payer: Medicare Other

## 2013-04-05 DIAGNOSIS — J9600 Acute respiratory failure, unspecified whether with hypoxia or hypercapnia: Secondary | ICD-10-CM

## 2013-04-05 DIAGNOSIS — J9690 Respiratory failure, unspecified, unspecified whether with hypoxia or hypercapnia: Secondary | ICD-10-CM | POA: Diagnosis present

## 2013-04-05 DIAGNOSIS — R6521 Severe sepsis with septic shock: Secondary | ICD-10-CM

## 2013-04-05 DIAGNOSIS — J189 Pneumonia, unspecified organism: Secondary | ICD-10-CM

## 2013-04-05 DIAGNOSIS — J129 Viral pneumonia, unspecified: Secondary | ICD-10-CM

## 2013-04-05 DIAGNOSIS — I959 Hypotension, unspecified: Secondary | ICD-10-CM

## 2013-04-05 DIAGNOSIS — R6520 Severe sepsis without septic shock: Secondary | ICD-10-CM

## 2013-04-05 DIAGNOSIS — N189 Chronic kidney disease, unspecified: Secondary | ICD-10-CM

## 2013-04-05 DIAGNOSIS — A419 Sepsis, unspecified organism: Secondary | ICD-10-CM

## 2013-04-05 DIAGNOSIS — I10 Essential (primary) hypertension: Secondary | ICD-10-CM

## 2013-04-05 DIAGNOSIS — I359 Nonrheumatic aortic valve disorder, unspecified: Secondary | ICD-10-CM

## 2013-04-05 DIAGNOSIS — N179 Acute kidney failure, unspecified: Secondary | ICD-10-CM

## 2013-04-05 DIAGNOSIS — I5040 Unspecified combined systolic (congestive) and diastolic (congestive) heart failure: Secondary | ICD-10-CM | POA: Diagnosis present

## 2013-04-05 LAB — BLOOD GAS, ARTERIAL
ACID-BASE DEFICIT: 10.3 mmol/L — AB (ref 0.0–2.0)
Bicarbonate: 13 mEq/L — ABNORMAL LOW (ref 20.0–24.0)
Drawn by: 31814
FIO2: 1 %
O2 SAT: 97 %
Patient temperature: 98.6
TCO2: 11.3 mmol/L (ref 0–100)
pCO2 arterial: 23.2 mmHg — ABNORMAL LOW (ref 35.0–45.0)
pH, Arterial: 7.365 (ref 7.350–7.450)
pO2, Arterial: 119 mmHg — ABNORMAL HIGH (ref 80.0–100.0)

## 2013-04-05 LAB — INFLUENZA PANEL BY PCR (TYPE A & B)
H1N1 flu by pcr: NOT DETECTED
INFLAPCR: NEGATIVE
INFLBPCR: NEGATIVE

## 2013-04-05 LAB — DIFFERENTIAL
BASOS ABS: 0 10*3/uL (ref 0.0–0.1)
Basophils Relative: 0 % (ref 0–1)
EOS ABS: 0 10*3/uL (ref 0.0–0.7)
Eosinophils Relative: 0 % (ref 0–5)
LYMPHS ABS: 0.3 10*3/uL — AB (ref 0.7–4.0)
Lymphocytes Relative: 2 % — ABNORMAL LOW (ref 12–46)
MONO ABS: 0 10*3/uL — AB (ref 0.1–1.0)
Monocytes Relative: 0 % — ABNORMAL LOW (ref 3–12)
Neutro Abs: 14.4 10*3/uL — ABNORMAL HIGH (ref 1.7–7.7)
Neutrophils Relative %: 98 % — ABNORMAL HIGH (ref 43–77)

## 2013-04-05 LAB — CBC WITH DIFFERENTIAL/PLATELET
BASOS ABS: 0 10*3/uL (ref 0.0–0.1)
Basophils Relative: 0 % (ref 0–1)
EOS ABS: 0 10*3/uL (ref 0.0–0.7)
Eosinophils Relative: 0 % (ref 0–5)
HCT: 46.9 % — ABNORMAL HIGH (ref 36.0–46.0)
Hemoglobin: 15.5 g/dL — ABNORMAL HIGH (ref 12.0–15.0)
LYMPHS PCT: 1 % — AB (ref 12–46)
Lymphs Abs: 0.3 10*3/uL — ABNORMAL LOW (ref 0.7–4.0)
MCH: 31.2 pg (ref 26.0–34.0)
MCHC: 33 g/dL (ref 30.0–36.0)
MCV: 94.4 fL (ref 78.0–100.0)
MONOS PCT: 4 % (ref 3–12)
Monocytes Absolute: 1.3 10*3/uL — ABNORMAL HIGH (ref 0.1–1.0)
NEUTROS PCT: 95 % — AB (ref 43–77)
Neutro Abs: 30 10*3/uL — ABNORMAL HIGH (ref 1.7–7.7)
Platelets: UNDETERMINED 10*3/uL (ref 150–400)
RBC: 4.97 MIL/uL (ref 3.87–5.11)
RDW: 14.9 % (ref 11.5–15.5)
WBC: 31.6 10*3/uL — AB (ref 4.0–10.5)

## 2013-04-05 LAB — URINALYSIS, ROUTINE W REFLEX MICROSCOPIC
Bilirubin Urine: NEGATIVE
GLUCOSE, UA: NEGATIVE mg/dL
KETONES UR: NEGATIVE mg/dL
LEUKOCYTES UA: NEGATIVE
Nitrite: NEGATIVE
PH: 5.5 (ref 5.0–8.0)
Protein, ur: 30 mg/dL — AB
Specific Gravity, Urine: 1.014 (ref 1.005–1.030)
Urobilinogen, UA: 0.2 mg/dL (ref 0.0–1.0)

## 2013-04-05 LAB — CBC
HCT: 52.8 % — ABNORMAL HIGH (ref 36.0–46.0)
Hemoglobin: 17.9 g/dL — ABNORMAL HIGH (ref 12.0–15.0)
MCH: 31.9 pg (ref 26.0–34.0)
MCHC: 33.9 g/dL (ref 30.0–36.0)
MCV: 94.1 fL (ref 78.0–100.0)
PLATELETS: ADEQUATE 10*3/uL (ref 150–400)
RBC: 5.61 MIL/uL — ABNORMAL HIGH (ref 3.87–5.11)
RDW: 14.6 % (ref 11.5–15.5)
WBC: 14.7 10*3/uL — ABNORMAL HIGH (ref 4.0–10.5)

## 2013-04-05 LAB — BASIC METABOLIC PANEL
BUN: 30 mg/dL — AB (ref 6–23)
BUN: 28 mg/dL — AB (ref 6–23)
CO2: 15 mEq/L — ABNORMAL LOW (ref 19–32)
CO2: 17 meq/L — AB (ref 19–32)
CREATININE: 2.29 mg/dL — AB (ref 0.50–1.10)
CREATININE: 1.97 mg/dL — AB (ref 0.50–1.10)
Calcium: 6.5 mg/dL — ABNORMAL LOW (ref 8.4–10.5)
Calcium: 6.4 mg/dL — CL (ref 8.4–10.5)
Chloride: 102 mEq/L (ref 96–112)
Chloride: 103 mEq/L (ref 96–112)
GFR calc Af Amer: 27 mL/min — ABNORMAL LOW (ref >90)
GFR calc non Af Amer: 23 mL/min — ABNORMAL LOW (ref >90)
GFR, EST AFRICAN AMERICAN: 22 mL/min — AB (ref >90)
GFR, EST NON AFRICAN AMERICAN: 19 mL/min — AB (ref >90)
GLUCOSE: 168 mg/dL — AB (ref 70–99)
Glucose, Bld: 227 mg/dL — ABNORMAL HIGH (ref 70–99)
POTASSIUM: 3.3 meq/L — AB (ref 3.7–5.3)
POTASSIUM: 4.1 meq/L (ref 3.7–5.3)
Sodium: 137 mEq/L (ref 137–147)
Sodium: 134 mEq/L — ABNORMAL LOW (ref 137–147)

## 2013-04-05 LAB — EXPECTORATED SPUTUM ASSESSMENT W GRAM STAIN, RFLX TO RESP C: Special Requests: NORMAL

## 2013-04-05 LAB — LACTIC ACID, PLASMA
LACTIC ACID, VENOUS: 5.6 mmol/L — AB (ref 0.5–2.2)
Lactic Acid, Venous: 2.2 mmol/L (ref 0.5–2.2)

## 2013-04-05 LAB — LEGIONELLA ANTIGEN, URINE: LEGIONELLA ANTIGEN, URINE: NEGATIVE

## 2013-04-05 LAB — URINE MICROSCOPIC-ADD ON

## 2013-04-05 LAB — PROCALCITONIN: PROCALCITONIN: 44.16 ng/mL

## 2013-04-05 LAB — EXPECTORATED SPUTUM ASSESSMENT W REFEX TO RESP CULTURE

## 2013-04-05 LAB — STREP PNEUMONIAE URINARY ANTIGEN: STREP PNEUMO URINARY ANTIGEN: NEGATIVE

## 2013-04-05 LAB — TROPONIN I

## 2013-04-05 LAB — MRSA PCR SCREENING: MRSA by PCR: NEGATIVE

## 2013-04-05 LAB — TSH: TSH: 3.528 u[IU]/mL (ref 0.350–4.500)

## 2013-04-05 MED ORDER — BIOTENE DRY MOUTH MT LIQD
15.0000 mL | Freq: Two times a day (BID) | OROMUCOSAL | Status: DC
  Administered 2013-04-05 – 2013-04-06 (×4): 15 mL via OROMUCOSAL

## 2013-04-05 MED ORDER — CHLORHEXIDINE GLUCONATE 0.12 % MT SOLN
15.0000 mL | Freq: Two times a day (BID) | OROMUCOSAL | Status: DC
  Administered 2013-04-05 – 2013-04-22 (×33): 15 mL via OROMUCOSAL
  Filled 2013-04-05 (×34): qty 15

## 2013-04-05 MED ORDER — DEXTROSE 5 % IV SOLN
500.0000 mg | INTRAVENOUS | Status: AC
  Administered 2013-04-05 – 2013-04-10 (×6): 500 mg via INTRAVENOUS
  Filled 2013-04-05 (×7): qty 500

## 2013-04-05 MED ORDER — AMIODARONE HCL 200 MG PO TABS
200.0000 mg | ORAL_TABLET | Freq: Every day | ORAL | Status: DC
  Filled 2013-04-05: qty 1

## 2013-04-05 MED ORDER — SODIUM CHLORIDE 0.9 % IV BOLUS (SEPSIS)
500.0000 mL | Freq: Once | INTRAVENOUS | Status: AC
Start: 2013-04-05 — End: 2013-04-05
  Administered 2013-04-05: 500 mL via INTRAVENOUS

## 2013-04-05 MED ORDER — SIMVASTATIN 20 MG PO TABS
20.0000 mg | ORAL_TABLET | Freq: Every day | ORAL | Status: DC
  Filled 2013-04-05 (×2): qty 1

## 2013-04-05 MED ORDER — ALPRAZOLAM 0.5 MG PO TABS
0.5000 mg | ORAL_TABLET | Freq: Once | ORAL | Status: AC
  Administered 2013-04-05: 0.5 mg via ORAL
  Filled 2013-04-05: qty 1

## 2013-04-05 MED ORDER — POTASSIUM CHLORIDE CRYS ER 20 MEQ PO TBCR: 40.0000 meq | EXTENDED_RELEASE_TABLET | Freq: Once | ORAL | Status: DC

## 2013-04-05 MED ORDER — PANTOPRAZOLE SODIUM 40 MG PO TBEC
40.0000 mg | DELAYED_RELEASE_TABLET | Freq: Every day | ORAL | Status: DC
  Administered 2013-04-05: 40 mg via ORAL
  Filled 2013-04-05 (×2): qty 1

## 2013-04-05 MED ORDER — HEPARIN SODIUM (PORCINE) 5000 UNIT/ML IJ SOLN
5000.0000 [IU] | Freq: Three times a day (TID) | INTRAMUSCULAR | Status: DC
  Administered 2013-04-05 – 2013-04-22 (×50): 5000 [IU] via SUBCUTANEOUS
  Filled 2013-04-05 (×55): qty 1

## 2013-04-05 MED ORDER — SODIUM CHLORIDE 0.9 % IV BOLUS (SEPSIS): 500.0000 mL | Freq: Once | INTRAVENOUS | Status: DC

## 2013-04-05 MED ORDER — OSELTAMIVIR PHOSPHATE 30 MG PO CAPS
30.0000 mg | ORAL_CAPSULE | Freq: Every day | ORAL | Status: DC
  Administered 2013-04-05: 30 mg via ORAL
  Filled 2013-04-05 (×2): qty 1

## 2013-04-05 MED ORDER — DEXTROSE 5 % IV SOLN
2.0000 ug/min | INTRAVENOUS | Status: DC
  Administered 2013-04-05: 15 ug/min via INTRAVENOUS
  Administered 2013-04-05: 10 ug/min via INTRAVENOUS
  Administered 2013-04-05 (×2): 18 ug/min via INTRAVENOUS
  Administered 2013-04-05 – 2013-04-06 (×4): 15 ug/min via INTRAVENOUS
  Administered 2013-04-06: 4 ug/min via INTRAVENOUS
  Administered 2013-04-06: 10 ug/min via INTRAVENOUS
  Administered 2013-04-06: 15 ug/min via INTRAVENOUS
  Filled 2013-04-05 (×8): qty 4

## 2013-04-05 MED ORDER — LEVOTHYROXINE SODIUM 75 MCG PO TABS
75.0000 ug | ORAL_TABLET | Freq: Every day | ORAL | Status: DC
Start: 2013-04-05 — End: 2013-04-06
  Administered 2013-04-05: 75 ug via ORAL
  Filled 2013-04-05 (×3): qty 1

## 2013-04-05 MED ORDER — PHENYLEPHRINE HCL 10 MG/ML IJ SOLN
30.0000 ug/min | INTRAVENOUS | Status: DC
  Administered 2013-04-05: 50 ug/min via INTRAVENOUS
  Administered 2013-04-05: 100 ug/min via INTRAVENOUS
  Administered 2013-04-05: 75 ug/min via INTRAVENOUS
  Filled 2013-04-05 (×2): qty 4

## 2013-04-05 MED ORDER — DEXTROSE 5 % IV SOLN
1.0000 g | INTRAVENOUS | Status: AC
  Administered 2013-04-05 – 2013-04-11 (×7): 1 g via INTRAVENOUS
  Filled 2013-04-05 (×8): qty 10

## 2013-04-05 MED ORDER — ASPIRIN 325 MG PO TABS
325.0000 mg | ORAL_TABLET | Freq: Every day | ORAL | Status: DC
  Administered 2013-04-05: 325 mg via ORAL
  Filled 2013-04-05 (×2): qty 1

## 2013-04-05 MED ORDER — PHENYLEPHRINE HCL 10 MG/ML IJ SOLN
30.0000 ug/min | INTRAVENOUS | Status: DC
  Administered 2013-04-05: 30 ug/min via INTRAVENOUS
  Filled 2013-04-05 (×2): qty 1

## 2013-04-05 MED ORDER — SODIUM CHLORIDE 0.9 % IV SOLN
INTRAVENOUS | Status: DC
  Administered 2013-04-05: 07:00:00 via INTRAVENOUS
  Administered 2013-04-05 (×2): 1000 mL via INTRAVENOUS
  Administered 2013-04-06: 22:00:00 via INTRAVENOUS
  Administered 2013-04-09 – 2013-04-10 (×3): 20 mL/h via INTRAVENOUS
  Administered 2013-04-11: 1000 mL via INTRAVENOUS
  Administered 2013-04-13: 500 mL via INTRAVENOUS
  Administered 2013-04-17 – 2013-04-19 (×2): via INTRAVENOUS
  Administered 2013-04-22: 20 mL via INTRAVENOUS

## 2013-04-05 MED ORDER — ESTROGENS CONJUGATED 0.3 MG PO TABS
0.3000 mg | ORAL_TABLET | Freq: Every day | ORAL | Status: DC
  Administered 2013-04-05: 0.3 mg via ORAL
  Filled 2013-04-05 (×2): qty 1

## 2013-04-05 MED ORDER — SODIUM CHLORIDE 0.9 % IV SOLN
1.0000 g | Freq: Once | INTRAVENOUS | Status: AC
  Administered 2013-04-05: 1 g via INTRAVENOUS
  Filled 2013-04-05: qty 10

## 2013-04-05 NOTE — H&P (Signed)
[facility] History and Physical  [name] CZY:[id number] DOB: 1932/07/02 DOA: 04/04/2013  Referring physician:  PCP: [name], MD   Chief Complaint: Shortness of breath  HPI: [name] is a 80 y.o. female  with a past medical history of nonischemic cardiomyopathy having an ejection fraction of 45-50%, hypertension, status post pacemaker implant, who presents to the emergency room with complaints of shortness of breath. She reports feeling ill yesterday developing multiple episodes of diarrhea with associated nausea and vomiting. she denied bright red blood per rectum or hematemesis. She attributed symptoms to food poisoning. Overnight developed subjective fevers and chills then this morning started having cough with accompanying shortness of breath, as well as generalized weakness, fatigue, malaise, bodyaches, and dizziness. Respiratory symptoms progressively worsened over the course of the day. She denies chest pain, abdominal pain, dysuria, hematuria, extremity edema.  She has not traveled recently nor has she been exposed to sick contacts. In the emergency department she was found to be in acute respiratory distress, using accessory muscles and required placement of nonrebreather. She was administered ceftriaxone and azithromycin, as well as Tamiflu. During my encounter she appears ill, he is hypotensive with systolic blood pressures in the 80s for which she was ordered another bolus of saline. I discussed case with pulmonary critical care medicine, as if she will be admitted to the intensive care unit.                                                                                                                                                                                                                                                                        Review of Systems:  Constitutional:  No weight loss, positive for night sweats, Fevers, chills, fatigue.   HEENT:  No headaches, Difficulty swallowing,Tooth/dental problems,Sore throat,  No sneezing, itching, ear ache, nasal congestion, post nasal drip,  Cardio-vascular:  No chest pain, Orthopnea, PND, swelling in lower extremities, anasarca, dizziness, palpitations  GI:  No heartburn, indigestion, abdominal pain, nausea, vomiting, diarrhea, change in bowel habits, loss of appetite  Resp:  Positive for shortness of breath with exertion or at rest. No excess mucus, no productive cough, No non-productive cough, No coughing up of blood.No change in color  of mucus.No wheezing.No chest wall deformity  Skin:  no rash or lesions.  GU:  no dysuria, change in color of urine, no urgency or frequency. No flank pain.  Musculoskeletal:  No joint pain or swelling. No decreased range of motion. No back pain.  Psych:  No change in mood or affect. No depression or anxiety. No memory loss.   Past Medical History  Diagnosis Date  . Hypothyroid   . Complete heart block 2002    Initial PPM 2002 with upgrade by JA to CRT-P 11/21/08  . Nonischemic cardiomyopathy   . Persistent atrial fibrillation   . Hypertension   . Hyperlipidemia   . Hives 4/10  . Cataract 4/13    right  . Renal failure     3rd stage-seeing neurologist   Past Surgical History  Procedure Laterality Date  . Pacemaker insertion  2002, 2010    PPM 2002, upgrade to CRT-P by Dr Johney Frame (MDT 2010)  . Partial colectomy  1/04  . Abdominal hysterectomy  1987    TAH  . Bunionectomy Right 1996  . Breast biopsy Left 01/1997  . Cataract extraction  4/13    both eyes  . Cyst excision      on back of left ear   Social History:  reports that she has never smoked. She has never used smokeless tobacco. She reports that she does not drink alcohol or use illicit drugs.  Allergies  Allergen Reactions  . Beta Adrenergic Blockers Other (See Comments)    Not working well for pt.  Sherrie Mustache Hcl] Shortness Of Breath  . Carvedilol  Shortness Of Breath    Chest  Discomfort.  . Dapsone Other (See Comments)    Kidney  failure  . Diclofenac Sodium Other (See Comments)    Causes  Fluid  Retention.  Consuelo Pandy [Pitavastatin Calcium] Shortness Of Breath  . Methotrexate Shortness Of Breath    Mouth sores.  . Metoprolol Succinate Hives  . Oxaprozin Diarrhea  . Other     Fragrance mixtures -  Cinol botanicals  &  Plants oil. Cocamidoproy   -   Betaine. Lipstick & other makeup & cleaning materials.   Marland Kitchen Epinephrine     Family History  Problem Relation Age of Onset  . Diabetes Maternal Aunt   . Breast cancer Maternal Aunt   . Stroke Mother   . Stroke Father   . Hypothyroidism Sister   . Hypothyroidism Son   . Hypothyroidism Mother   . Hypertension Father   . Kidney disease Father     renal problems     Prior to Admission medications   Medication Sig Start Date End Date Taking? Authorizing Provider  amiodarone (PACERONE) 200 MG tablet Take 200 mg by mouth daily.   Yes Historical Provider, MD  aspirin 325 MG tablet Take 325 mg by mouth daily.     Yes Historical Provider, MD  Biotin 5 MG CAPS Take 5 mg by mouth daily.     Yes Historical Provider, MD  Calcium Carbonate (CALTRATE 600 PO) Take 1 tablet by mouth daily.     Yes Historical Provider, MD  Cholecalciferol (VITAMIN D3 PO) Take 1,000 Units by mouth daily.     Yes Historical Provider, MD  estrogens, conjugated, (PREMARIN) 0.3 MG tablet Take 1 tablet (0.3 mg total) by mouth daily. 12/03/12  Yes Annamaria Boots, MD  fluticasone [facility]) 50 MCG/ACT nasal spray Place 2 sprays into both nostrils as needed for allergies or rhinitis.  Yes Historical Provider, MD  folic acid (FOLVITE) 1 MG tablet Take 1 mg by mouth daily.     Yes Historical Provider, MD  furosemide (LASIX) 40 MG tablet Take 40 mg by mouth daily.     Yes Historical Provider, MD  levothyroxine (SYNTHROID, LEVOTHROID) 75 MCG tablet Take 75 mcg by mouth daily after breakfast.   Yes Historical Provider,  MD  lisinopril (PRINIVIL,ZESTRIL) 10 MG tablet Take 10 mg by mouth daily.   Yes Historical Provider, MD  loratadine (CLARITIN) 10 MG tablet Take 10 mg by mouth daily.     Yes Historical Provider, MD  metroNIDAZOLE (METROCREAM) 0.75 % cream Apply 1 application topically as needed.   Yes Historical Provider, MD  Misc Natural Products (OSTEO BI-FLEX ADV DOUBLE ST) CAPS Take 1 capsule by mouth daily.     Yes Historical Provider, MD  montelukast (SINGULAIR) 10 MG tablet Take 10 mg by mouth every evening.    Yes Historical Provider, MD  naproxen sodium (ANAPROX) 220 MG tablet Take 220 mg by mouth every 12 (twelve) hours.   Yes Historical Provider, MD  polyethylene glycol (MIRALAX / GLYCOLAX) packet Take 17 g by mouth every other day.     Yes Historical Provider, MD  simvastatin (ZOCOR) 20 MG tablet Take 20 mg by mouth at bedtime.     Yes Historical Provider, MD  spironolactone (ALDACTONE) 25 MG tablet Take 25 mg by mouth as directed. Take 1 tab on  M,  W,  And   F.    Yes Historical Provider, MD  vitamin C (ASCORBIC ACID) 500 MG tablet Take 500 mg by mouth daily.     Yes Historical Provider, MD   Physical Exam: Filed Vitals:   04/04/13 2327  BP: 99/53  Pulse: 88  Temp:   Resp: 21    BP 99/53  Pulse 88  Temp(Src) 99.9 F (37.7 C) (Oral)  Resp 21  SpO2 97%  LMP 03/03/1985  General:  Toxic, ill-appearing, in moderate respiratory distress. Eyes: PERRL, normal lids, irises & conjunctiva ENT: grossly normal hearing, lips & tongue Neck: no LAD, masses or thyromegaly Cardiovascular: Tachycardic, RRR, no m/r/g. No LE edema. Telemetry: SR, tachycardic, no arrhythmias  Respiratory: She has bibasilar crackles, positive rhonchi , in moderate respiratory distress, on 100% nonrebreather Abdomen: soft, ntnd Skin: no rash or induration seen on limited exam Musculoskeletal: grossly normal tone BUE/BLE Psychiatric: grossly normal mood and affect, speech fluent and appropriate Neurologic: grossly  non-focal.          Labs on Admission:  Basic Metabolic Panel:  Recent Labs Lab 04/04/13 2220  NA 139  K 3.3*  CL 102  CO2 15*  GLUCOSE 142*  BUN 29*  CREATININE 1.81*  CALCIUM 8.1*   Liver Function Tests:  Recent Labs Lab 04/04/13 2220  AST 58*  ALT 49*  ALKPHOS 69  BILITOT 0.6  PROT 6.9  ALBUMIN 2.9*   No results found for this basename: LIPASE, AMYLASE,  in the last 168 hours No results found for this basename: AMMONIA,  in the last 168 hours CBC:  Recent Labs Lab 04/04/13 2303  WBC 14.7*  NEUTROABS PENDING  HGB 17.9*  HCT 52.8*  MCV 94.1  PLT PENDING   Cardiac Enzymes:  Recent Labs Lab 04/04/13 2224  TROPONINI <0.30    BNP (last 3 results)  Recent Labs  04/10/12 1946 04/04/13 2133  PROBNP 239.4 379.5   CBG: No results found for this basename: GLUCAP,  in the last 168 hours  Radiological Exams on Admission: Dg Chest Port 1 View  04/04/2013   CLINICAL DATA:  Fever and shortness of breath.  EXAM: PORTABLE CHEST - 1 VIEW  COMPARISON:  11/22/2012.  FINDINGS: Normal sized heart. Stable minimal scarring at the left lung base. Interval mild prominence of the interstitial markings. No pleural fluid. Stable left subclavian pacer and AICD leads. Unremarkable bones.  IMPRESSION: Interval mild diffuse interstitial lung disease, suspicious for interstitial pneumonitis.   Electronically Signed   By: Gordan PaymentSteve  Reid M.D.   On: 04/04/2013 22:12    EKG: Independently reviewed.   Assessment/Plan Active Problems:   Respiratory failure   CAP (community acquired pneumonia)   Sepsis   Acute on chronic renal failure   Viral pneumonia   Essential hypertension, benign   Hypotension   Combined systolic and diastolic heart failure   1. Acute hypoxemic respiratory failure, evidence by the presence of severe respiratory distress, use of accessory muscles, requiring 100% nonrebreather, having 24 breaths per minute. Suspect secondary to viral pneumonia although there  also could be a superimposed bacterial pneumonia. I discussed case with pulmonary critical care medicine as she will require ICU admission. Meanwhile start her on Tamiflu as well as empiric IV antibiotic therapy for her to acquire pneumonia with ceftriaxone and azithromycin.  2. Sepsis, present on admission, evidenced by hypotension, leukocytosis with white count of 14,700, the development of acute on chronic renal failure, presence of acute hypoxemic respiratory failure. I suspect source of infection to be her lungs, as if she is being covered with Tamiflu as well as IV ceftriaxone and azithromycin. Followup on blood and sputum cultures. Provide aggressive IV fluid resuscitation, admission to the intensive care unit. 3. Viral pneumonia/community acquire pneumonia. Patient presenting with clinical signs and symptoms suggesting pneumonia. She had a chest x-ray on admission which showed diffuse interstitial lung disease suspicious for interstitial pneumonitis. Starting empiric antibiotics, repeat chest x-ray in a.m. Place her on pneumonia pathway. 4. History of chronic combined systolic/diastolic congestive heart failure. Last transthoracic echocardiogram performed 03/10/2010 which showed an ejection fraction of 45-50%, grade 1 diastolic dysfunction. Initial lab work showing a BNP of 379, I think acute CHF less likely. We'll monitor closely for fluid overload as she is receiving IV fluid resuscitation. Diuretic therapy has been discontinued. 5. Hypothyroidism. Continue levothyroxine, check a TSH. 6. Acute on chronic renal failure. Today's lab showed a creatinine 1.81, increased from 1.56 on 04/10/2012. She appears to have a baseline creatinine near 1.4. Likely secondary to sepsis, divided aggressive IV fluid resuscitation. 7. DVT prophylaxis. Heparin subcutaneous    Code Status: Full code Family Communication: I spoke with patient's husband present at bedside Disposition Plan: I anticipate she will require  greater than 2 night hospitalization  Time spent: 70 minutes  Jeralyn BennettZAMORA, Jahnae Mcadoo [facility] Pager [phone]

## 2013-04-05 NOTE — Progress Notes (Signed)
  Echocardiogram 2D Echocardiogram has been performed.  [name] 04/05/2013, 11:25 AM

## 2013-04-05 NOTE — Progress Notes (Signed)
RT placed patient on B ipap per MD Order. Patient placed on 10/5 and on 100%. Patient seems to be tolerating well at this time. RT will continue to monitor.

## 2013-04-05 NOTE — Progress Notes (Signed)
Spoke with [name] Minor, NP about the care of this patient who is currently on Bipap and pressor support.  PCCM will assume the care of this patient.  Call [facility] [phone] when TRH needed to re-assume the care of this patient.  RAMA,CHRISTINA 04/05/2013 8:56 AM

## 2013-04-05 NOTE — Progress Notes (Signed)
Patient asking for something to sleep despite giving xanax. RN says she is not tolerating bipap well  Camera care  Variable  0 Points 1 Point 2 Points Total  Heart rate per minute  <90 beats 90-109 beats >110 beats 0  Respiratory  Rate per minute < 18 breaths 19-30 breaths  >30 breaths 1  Restlessness; nonpurposeful movements None  occas slight movement Frequent movement 1  Paradoxical breathing pattern: None  Present 0  Accessory muscle use: rise in clavicle during inspiration None Slight rise Pronounced rise 0  Grunting at end-expiration: guttural sound None  Present 00  Nasal flaring: involuntary movement of nares None  Present 0  Look of fear None  Eyes wide 0  Overall total out of 16    2    Respiratory Distress Observation Scale Journal of Palliative Medicine Vol. 13, Number 3, 2010 Campbell et al.  A Mild- Mod increased wob  P Monitor with face mask/bipap   Dr. Kalman Shan, M.D., [facility].C.P Pulmonary and Critical Care Medicine Staff Physician [place] [facility] [place] [facility] Pager: [phone], If no answer or between  15:00h - 7:00h: call [phone]  04/05/2013 9:49 PM

## 2013-04-05 NOTE — Progress Notes (Signed)
RT attempted to insert A-line x 2 without results. MD is putting in a central line and will put in A-line. RN aware.

## 2013-04-05 NOTE — Progress Notes (Signed)
Chest pressure  On pacer  Cardiax hx +    Recent Labs Lab 04/04/13 2224  TROPONINI <0.30     PLAN Trop x 3  Dr. Kalman Shan, M.D., [facility].C.P Pulmonary and Critical Care Medicine Staff Physician [place] [facility] [place] [facility] Pager: [phone], If no answer or between  15:00h - 7:00h: call [phone]  04/05/2013 7:09 PM

## 2013-04-05 NOTE — Progress Notes (Signed)
CARE MANAGEMENT NOTE 04/05/2013  Patient:  [name], [name]   Account Number:  [id number]  Date Initiated:  04/05/2013  Documentation initiated by:  Amire Gossen  Subjective/Objective Assessment:   pt with hx of resp failure admitted with hypoxia and requiring bipap.     Action/Plan:   lives at home with spouse , no known history of hhc or o2 at home.   Anticipated DC Date:  04/08/2013   Anticipated DC Plan:  HOME/SELF CARE  In-house referral  NA      DC Planning Services  NA      [facility] Choice  NA   Choice offered to / List presented to:  NA   DME arranged  NA      DME agency  NA     HH arranged  NA      HH agency  NA   Status of service:  In process, will continue to follow Medicare Important Message given?  NA - LOS <3 / Initial given by admissions (If response is "NO", the following Medicare IM given date fields will be blank) Date Medicare IM given:   Date Additional Medicare IM given:    Discharge Disposition:    Per UR Regulation:  Reviewed for med. necessity/level of care/duration of stay  If discussed at Long Length of Stay Meetings, dates discussed:    Comments:  [id number]/Salvator Seppala Stark Jock, BSN, [place] [phone] Chart Reviewed for discharge and hospital needs. Discharge needs at time of review:  None present will follow for needs. Review of patient progress due on [id number].

## 2013-04-05 NOTE — Procedures (Signed)
Central Venous Catheter Insertion Procedure Note [name] [id number] 1933/02/06  Procedure: Insertion of Central Venous Catheter Indications: Assessment of intravascular volume, Drug and/or fluid administration and Frequent blood sampling  Procedure Details Consent: Risks of procedure as well as the alternatives and risks of each were explained to the (patient/caregiver).  Consent for procedure obtained. Time Out: Verified patient identification, verified procedure, site/side was marked, verified correct patient position, special equipment/implants available, medications/allergies/relevent history reviewed, required imaging and test results available.  Performed  Maximum sterile technique was used including antiseptics, cap, gloves, gown, hand hygiene, mask and sheet. Skin prep: Chlorhexidine; local anesthetic administered A antimicrobial bonded/coated triple lumen catheter was placed in the right internal jugular vein using the Seldinger technique.  Evaluation Blood flow good Complications: No apparent complications Patient did tolerate procedure well. Chest X-ray ordered to verify placement.  CXR: pending.  Procedure performed with ultrasound guidance for real time vessel cannulation.     Canary Brim, NP-C [place] [facility] Pgr: [phone] or [phone]    04/05/2013, 4:41 AM  [name], MD, PhD 04/05/2013, 9:00 AM [place] [facility] [phone] or if no answer [phone]

## 2013-04-05 NOTE — Progress Notes (Signed)
Inpatient Diabetes Program Recommendations  AACE/ADA: New Consensus Statement on Inpatient Glycemic Control (2013)  Target Ranges:  Prepandial:   less than 140 mg/dL      Peak postprandial:   less than 180 mg/dL (1-2 hours)      Critically ill patients:  140 - 180 mg/dL   Reason for Visit: Hyperglycemia  Diabetes history: None Outpatient Diabetes medications: None  Results for ARRIKA, SEDLER (MRN [id number]) as of 04/05/2013 15:16  Ref. Range 04/04/2013 22:20 04/05/2013 05:00  Glucose Latest Range: 70-99 mg/dL 350 (H) 093 (H)    Inpatient Diabetes Program Recommendations HgbA1C: Check HgbA1C to assess glycemic control prior to hospitalization  Note: Will follow. Thank you. Ailene Ards, RD, LDN, CDE Inpatient Diabetes Coordinator [phone]

## 2013-04-05 NOTE — Consult Note (Signed)
Name: [name] MRN: [id number] DOB: April 03, 1932    ADMISSION DATE:  04/04/2013 CONSULTATION DATE:  04/05/13  REFERRING MD :  Dr. Vanessa Barbara  PRIMARY SERVICE: TR[facility]-->PCCM  CHIEF COMPLAINT:  Hypotension   BRIEF PATIENT DESCRIPTION: 80 y/o F admitted with respiratory failure / severe sepsis.    SIGNIFICANT EVENTS / STUDIES:  2/3 - Admit with fever, SOB, vomiting, diarrhea (concern for food poisoning) & CXR concerning for interstitial pneumonitis  LINES / TUBES: L IJ TLC 2/3>>>  CULTURES: MRSA PCR 2/3>>> BCx2 2/3>>> Sputum 2/3>>> UA 2/3>>> Resp Viral Panel 2/3>>>  ANTIBIOTICS: Rocephin 2/3>>> Azithro 2/3>>> Tamiflu 2/3>>>  HISTORY OF PRESENT ILLNESS:  80 y/o F, never smoker, with PMH of hypothyroidism, HLD, HTN, CHB / ICM s/p pacemaker who presented to [facility] ER on 2/2 with one week history of dry, minimally productive cough.  On 2/1 she ate out and after eating she began vomiting and noticed chills.  On am of 2/2 she had several episodes of diarrhea, abdominal pain and weakness.  As the day progressed, she developed fevers and shortness of breath.    In ER on arrival,  she was noted to have temp of 101, BP of 56/29, WBC 14.7, neg troponin, lactate of 5.6, sr cr of 1.81 and cxr concerning for interstitial pneumonitis.    Patient denies known sick contacts, recent travel, chest pain, pain with inspiration, headaches, syncope, hematemesis, rectal bleeding.    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     PAST MEDICAL HISTORY :  Past Medical History  Diagnosis Date  . Hypothyroid   . Complete heart block 2002    Initial PPM 2002 with upgrade by JA to CRT-P 11/21/08  . Nonischemic cardiomyopathy   . Persistent atrial fibrillation   . Hypertension   . Hyperlipidemia   . Hives 4/10  . Cataract 4/13    right  . Renal failure     3rd stage-seeing neurologist   Past Surgical History  Procedure Laterality  Date  . Pacemaker insertion  2002, 2010    PPM 2002, upgrade to CRT-P by Dr Johney Frame (MDT 2010)  . Partial colectomy  1/04  . Abdominal hysterectomy  1987    TAH  . Bunionectomy Right 1996  . Breast biopsy Left 01/1997  . Cataract extraction  4/13    both eyes  . Cyst excision      on back of left ear   Prior to Admission medications   Medication Sig Start Date End Date Taking? Authorizing Provider  amiodarone (PACERONE) 200 MG tablet Take 200 mg by mouth daily.   Yes Historical Provider, MD  aspirin 325 MG tablet Take 325 mg by mouth daily.     Yes Historical Provider, MD  Biotin 5 MG CAPS Take 5 mg by mouth daily.     Yes Historical Provider, MD  Calcium Carbonate (CALTRATE 600 PO) Take 1 tablet by mouth daily.     Yes Historical Provider, MD  Cholecalciferol (VITAMIN D3 PO) Take 1,000 Units by mouth daily.     Yes Historical Provider, MD  estrogens, conjugated, (PREMARIN) 0.3 MG tablet Take 1 tablet (0.3 mg total) by mouth daily. 12/03/12  Yes Annamaria Boots, MD  fluticasone [facility]) 50 MCG/ACT nasal spray Place 2 sprays into both nostrils as needed for allergies or rhinitis.   Yes Historical Provider, MD  folic acid (FOLVITE) 1 MG tablet Take  1 mg by mouth daily.     Yes Historical Provider, MD  furosemide (LASIX) 40 MG tablet Take 40 mg by mouth daily.     Yes Historical Provider, MD  levothyroxine (SYNTHROID, LEVOTHROID) 75 MCG tablet Take 75 mcg by mouth daily after breakfast.   Yes Historical Provider, MD  lisinopril (PRINIVIL,ZESTRIL) 10 MG tablet Take 10 mg by mouth daily.   Yes Historical Provider, MD  loratadine (CLARITIN) 10 MG tablet Take 10 mg by mouth daily.     Yes Historical Provider, MD  metroNIDAZOLE (METROCREAM) 0.75 % cream Apply 1 application topically as needed.   Yes Historical Provider, MD  Misc Natural Products (OSTEO BI-FLEX ADV DOUBLE ST) CAPS Take 1 capsule by mouth daily.     Yes Historical Provider, MD  montelukast (SINGULAIR) 10 MG tablet Take 10 mg by  mouth every evening.    Yes Historical Provider, MD  naproxen sodium (ANAPROX) 220 MG tablet Take 220 mg by mouth every 12 (twelve) hours.   Yes Historical Provider, MD  polyethylene glycol (MIRALAX / GLYCOLAX) packet Take 17 g by mouth every other day.     Yes Historical Provider, MD  simvastatin (ZOCOR) 20 MG tablet Take 20 mg by mouth at bedtime.     Yes Historical Provider, MD  spironolactone (ALDACTONE) 25 MG tablet Take 25 mg by mouth as directed. Take 1 tab on  M,  W,  And   F.    Yes Historical Provider, MD  vitamin C (ASCORBIC ACID) 500 MG tablet Take 500 mg by mouth daily.     Yes Historical Provider, MD   Allergies  Allergen Reactions  . Beta Adrenergic Blockers Other (See Comments)    Not working well for pt.  Sherrie Mustache Hcl] Shortness Of Breath  . Carvedilol Shortness Of Breath    Chest  Discomfort.  . Dapsone Other (See Comments)    Kidney  failure  . Diclofenac Sodium Other (See Comments)    Causes  Fluid  Retention.  Consuelo Pandy [Pitavastatin Calcium] Shortness Of Breath  . Methotrexate Shortness Of Breath    Mouth sores.  . Metoprolol Succinate Hives  . Oxaprozin Diarrhea  . Other     Fragrance mixtures -  Cinol botanicals  &  Plants oil. Cocamidoproy   -   Betaine. Lipstick & other makeup & cleaning materials.   Marland Kitchen Epinephrine     FAMILY HISTORY:  Family History  Problem Relation Age of Onset  . Diabetes Maternal Aunt   . Breast cancer Maternal Aunt   . Stroke Mother   . Stroke Father   . Hypothyroidism Sister   . Hypothyroidism Son   . Hypothyroidism Mother   . Hypertension Father   . Kidney disease Father     renal problems   SOCIAL HISTORY:  reports that she has never smoked. She has never used smokeless tobacco. She reports that she does not drink alcohol or use illicit drugs.  REVIEW OF SYSTEMS:  See HPI for pertinent positives.  All systems reviewed and otherwise negative.   SUBJECTIVE:  Appears comfortable on BiPAP  VITAL  SIGNS: Temp:  [99.9 F (37.7 C)-101 F (38.3 C)] 101 F (38.3 C) (02/03 0135) Pulse Rate:  [80-98] 80 (02/03 0015) Resp:  [0-24] 0 (02/03 0130) BP: (76-124)/(42-78) 110/78 mmHg (02/03 0130) SpO2:  [90 %-98 %] 98 % (02/03 0015) Weight:  [167 lb 5.3 oz (75.9 kg)] 167 lb 5.3 oz (75.9 kg) (02/03 0135)  HEMODYNAMICS:  VENTILATOR SETTINGS:    INTAKE / OUTPUT: Intake/Output   None     PHYSICAL EXAMINATION: General:  wdwn elderly female tolerating BiPAP Neuro:  AAOx4, speech clear, MAE HEENT:  Mm pink/dry, no jvd Cardiovascular:  s1s2 rrr, paced on monitor Lungs:  resp's even/non-labored on BiPAP, lungs bilaterally coarse Abdomen:  Round/soft, bsx4 active Musculoskeletal:  No acute deformities  Skin:  Warm/dry, no edema  LABS:  CBC  Recent Labs Lab 04/04/13 2303  WBC 14.7*  HGB 17.9*  HCT 52.8*  PLT PLATELET CLUMPS NOTED ON SMEAR, COUNT APPEARS ADEQUATE   Coag's No results found for this basename: APTT, INR,  in the last 168 hours BMET  Recent Labs Lab 04/04/13 2220  NA 139  K 3.3*  CL 102  CO2 15*  BUN 29*  CREATININE 1.81*  GLUCOSE 142*   Electrolytes  Recent Labs Lab 04/04/13 2220  CALCIUM 8.1*   Sepsis Markers  Recent Labs Lab 04/04/13 2220  LATICACIDVEN 5.2*   ABG  Recent Labs Lab 04/04/13 2147 04/05/13 0150  PHART 7.420 7.365  PCO2ART 21.5* 23.2*  PO2ART 106.0* 119.0*   Liver Enzymes  Recent Labs Lab 04/04/13 2220  AST 58*  ALT 49*  ALKPHOS 69  BILITOT 0.6  ALBUMIN 2.9*   Cardiac Enzymes  Recent Labs Lab 04/04/13 2133 04/04/13 2224  TROPONINI  --  <0.30  PROBNP 379.5  --    Glucose No results found for this basename: GLUCAP,  in the last 168 hours  Imaging Dg Chest Port 1 View  04/04/2013   CLINICAL DATA:  Fever and shortness of breath.  EXAM: PORTABLE CHEST - 1 VIEW  COMPARISON:  11/22/2012.  FINDINGS: Normal sized heart. Stable minimal scarring at the left lung base. Interval mild prominence of the  interstitial markings. No pleural fluid. Stable left subclavian pacer and AICD leads. Unremarkable bones.  IMPRESSION: Interval mild diffuse interstitial lung disease, suspicious for interstitial pneumonitis.   Electronically Signed   By: Gordan Payment M.D.   On: 04/04/2013 22:12    ASSESSMENT / PLAN:  PULMONARY A: Acute Respiratory Distress - concern for viral process with pneumonitis vs possible aspiration with vomiting  B infiltrates > ddx ARDS, viral pneumonitis, bacterial PNA, cardiogenic edema, consider amiodarone P:   -oxygen to support sats > 92% -BiPAP prn -pulmonary hygiene -empiric CAP coverage -empiric viral coverage with tamiflu -TTE to assess LV fxn  CARDIOVASCULAR A:  Septic Shock Hx HTN Hx CHF / ICM s/p Pacemeker - compensated AFib on amiodarone P:  -levophed for MAP >65 -consider stress steroids -may require additional volume pending CVP assessment (CHF / ICM) -reassess lactate -hold oral amiodarone, pending cx data and w/u for her resp sx -hold zocor -attempts were made at both R/L radials for arterial line with inability to thread wire -assess CVP  RENAL A:   Acute Kidney Injury  P:   -ensure adequate hydration / perfusion  -trend BMP -replace lytes as indicated   GASTROINTESTINAL A:   Nausea / Vomiting / Diarrhea P:   -if further diarrhea, assess stool for culture -PPI -zofran  HEMATOLOGIC A:   Leukocytosis DVT Prophylaxis  P:  -heparin for DVT -monitor cbc   INFECTIOUS A:   Gastroenteritis, Diarrhea R/O CAP vs Viral Respiratory Illness; note Pct elevated 2/3 P:   -abx & cultures as above > ceftriaxone + azithro -assess UA in elderly female to r/o urine source infection (with n/v) -consider stool O&P -empiric tamiflu   ENDOCRINE A:   Hypothyroidism  P:   -continue synthroid  NEUROLOGIC A:   No acute process P:   -monitor / supportive care  Canary BrimBrandi Ollis, NP-C [place] [facility] Pgr: [phone] or  [phone]    I have personally obtained a history, examined the patient, evaluated laboratory and imaging results, formulated the assessment and plan and placed orders.  CRITICAL CARE: The patient is critically ill with multiple organ systems failure and requires high complexity decision making for assessment and support, frequent evaluation and titration of therapies, application of advanced monitoring technologies and extensive interpretation of multiple databases. Critical Care Time devoted to patient care services described in this note is 60 minutes.  04/05/2013, 2:32 AM   Levy Pupaobert Jlyn Cerros, MD, PhD 04/05/2013, 9:09 AM [place] [facility] [phone] or if no answer [phone]

## 2013-04-05 NOTE — Progress Notes (Addendum)
Calcium 6.9 Alb 2.9 corr calcium is 7.4   Recent Labs Lab 04/04/13 2220 04/05/13 0500 04/05/13 1700  CREATININE 1.81* 2.29* 1.97*    P Calcijm gluconate x 1 amp  Dr. Kalman Shan, M.D., [facility].C.P Pulmonary and Critical Care Medicine Staff Physician [place] [facility] [place] [facility] Pager: [phone], If no answer or between  15:00h - 7:00h: call [phone]  04/05/2013 6:03 PM

## 2013-04-05 NOTE — Progress Notes (Signed)
ANxiety when placing bipap  Plan Xanax 0,5mg  x 1   Dr. Kalman Shan, M.D., [facility].C.P Pulmonary and Critical Care Medicine Staff Physician [place] [facility] [place] [facility] Pager: [phone], If no answer or between  15:00h - 7:00h: call [phone]  04/05/2013 9:03 PM

## 2013-04-05 NOTE — Plan of Care (Signed)
Problem: Phase I Progression Outcomes Goal: Voiding-avoid urinary catheter unless indicated Outcome: Not Progressing Pt has end stage renal diease.

## 2013-04-06 ENCOUNTER — Inpatient Hospital Stay (HOSPITAL_COMMUNITY): Payer: Medicare Other

## 2013-04-06 LAB — BLOOD GAS, ARTERIAL
ACID-BASE DEFICIT: 6.7 mmol/L — AB (ref 0.0–2.0)
ACID-BASE DEFICIT: 7.5 mmol/L — AB (ref 0.0–2.0)
Acid-base deficit: 7 mmol/L — ABNORMAL HIGH (ref 0.0–2.0)
BICARBONATE: 17.3 meq/L — AB (ref 20.0–24.0)
Bicarbonate: 16.7 mEq/L — ABNORMAL LOW (ref 20.0–24.0)
Bicarbonate: 17 mEq/L — ABNORMAL LOW (ref 20.0–24.0)
Drawn by: 11249
Drawn by: 10370
Drawn by: 103701
FIO2: 1 %
FIO2: 0.7 %
FIO2: 0.7 %
MECHVT: 370 mL
O2 Saturation: 87.3 %
O2 Saturation: 89.8 %
O2 Saturation: 91.2 %
PATIENT TEMPERATURE: 100.2
PCO2 ART: 35.3 mmHg (ref 35.0–45.0)
PCO2 ART: 35 mmHg (ref 35.0–45.0)
PEEP/CPAP: 10 cmH2O
PEEP: 10 cmH2O
PH ART: 7.367 (ref 7.350–7.450)
PH ART: 7.324 — AB (ref 7.350–7.450)
PO2 ART: 68.4 mmHg — AB (ref 80.0–100.0)
Patient temperature: 101.3
Patient temperature: 101
RATE: 16 resp/min
RATE: 18 resp/min
TCO2: 14.8 mmol/L (ref 0–100)
TCO2: 15.3 mmol/L (ref 0–100)
TCO2: 15.8 mmol/L (ref 0–100)
VT: 420 mL
pCO2 arterial: 30.2 mmHg — ABNORMAL LOW (ref 35.0–45.0)
pH, Arterial: 7.313 — ABNORMAL LOW (ref 7.350–7.450)
pO2, Arterial: 57.9 mmHg — ABNORMAL LOW (ref 80.0–100.0)
pO2, Arterial: 71.3 mmHg — ABNORMAL LOW (ref 80.0–100.0)

## 2013-04-06 LAB — BASIC METABOLIC PANEL
BUN: 25 mg/dL — ABNORMAL HIGH (ref 6–23)
CHLORIDE: 104 meq/L (ref 96–112)
CO2: 18 mEq/L — ABNORMAL LOW (ref 19–32)
Calcium: 6.7 mg/dL — ABNORMAL LOW (ref 8.4–10.5)
Creatinine, Ser: 1.74 mg/dL — ABNORMAL HIGH (ref 0.50–1.10)
GFR calc Af Amer: 31 mL/min — ABNORMAL LOW (ref >90)
GFR calc non Af Amer: 27 mL/min — ABNORMAL LOW (ref >90)
GLUCOSE: 167 mg/dL — AB (ref 70–99)
POTASSIUM: 3.9 meq/L (ref 3.7–5.3)
SODIUM: 135 meq/L — AB (ref 137–147)

## 2013-04-06 LAB — CBC
HEMATOCRIT: 41.2 % (ref 36.0–46.0)
Hemoglobin: 13.8 g/dL (ref 12.0–15.0)
MCH: 31 pg (ref 26.0–34.0)
MCHC: 33.5 g/dL (ref 30.0–36.0)
MCV: 92.6 fL (ref 78.0–100.0)
Platelets: UNDETERMINED 10*3/uL (ref 150–400)
RBC: 4.45 MIL/uL (ref 3.87–5.11)
RDW: 14.7 % (ref 11.5–15.5)
WBC: 20.3 10*3/uL — AB (ref 4.0–10.5)

## 2013-04-06 LAB — HEPATIC FUNCTION PANEL
ALT: 167 U/L — AB (ref 0–35)
AST: 130 U/L — ABNORMAL HIGH (ref 0–37)
Albumin: 2 g/dL — ABNORMAL LOW (ref 3.5–5.2)
Alkaline Phosphatase: 41 U/L (ref 39–117)
Bilirubin, Direct: <0.2 mg/dL (ref 0.0–0.3)
TOTAL PROTEIN: 4.8 g/dL — AB (ref 6.0–8.3)
Total Bilirubin: <0.2 mg/dL — ABNORMAL LOW (ref 0.3–1.2)

## 2013-04-06 LAB — TROPONIN I: Troponin I: <0.3 ng/mL (ref <0.30)

## 2013-04-06 LAB — RESPIRATORY VIRUS PANEL
Adenovirus: NOT DETECTED
INFLUENZA A: NOT DETECTED
Influenza A H1: NOT DETECTED
Influenza A H3: NOT DETECTED
Influenza B: NOT DETECTED
Metapneumovirus: NOT DETECTED
PARAINFLUENZA 2 A: NOT DETECTED
Parainfluenza 1: NOT DETECTED
Parainfluenza 3: NOT DETECTED
RESPIRATORY SYNCYTIAL VIRUS B: NOT DETECTED
Respiratory Syncytial Virus A: NOT DETECTED
Rhinovirus: NOT DETECTED

## 2013-04-06 LAB — MAGNESIUM: Magnesium: 1.6 mg/dL (ref 1.5–2.5)

## 2013-04-06 LAB — PROCALCITONIN: Procalcitonin: 27.65 ng/mL

## 2013-04-06 LAB — GLUCOSE, CAPILLARY: GLUCOSE-CAPILLARY: 138 mg/dL — AB (ref 70–99)

## 2013-04-06 LAB — PHOSPHORUS: Phosphorus: 2.2 mg/dL — ABNORMAL LOW (ref 2.3–4.6)

## 2013-04-06 LAB — PROTIME-INR
INR: 1.24 (ref 0.00–1.49)
Prothrombin Time: 15.3 seconds — ABNORMAL HIGH (ref 11.6–15.2)

## 2013-04-06 MED ORDER — ASPIRIN 325 MG PO TABS
325.0000 mg | ORAL_TABLET | Freq: Every day | ORAL | Status: DC
  Administered 2013-04-06 – 2013-04-22 (×15): 325 mg
  Filled 2013-04-06 (×17): qty 1

## 2013-04-06 MED ORDER — MIDAZOLAM HCL 2 MG/2ML IJ SOLN
INTRAMUSCULAR | Status: AC
  Administered 2013-04-06: 2 mg
  Filled 2013-04-06: qty 4

## 2013-04-06 MED ORDER — ETOMIDATE 2 MG/ML IV SOLN
INTRAVENOUS | Status: AC
  Administered 2013-04-06: 20 mg
  Filled 2013-04-06: qty 20

## 2013-04-06 MED ORDER — FENTANYL CITRATE 0.05 MG/ML IJ SOLN
50.0000 ug | INTRAMUSCULAR | Status: DC | PRN
  Administered 2013-04-06: 100 ug via INTRAVENOUS
  Administered 2013-04-11 – 2013-04-14 (×2): 50 ug via INTRAVENOUS

## 2013-04-06 MED ORDER — PANTOPRAZOLE SODIUM 40 MG PO PACK
40.0000 mg | PACK | Freq: Every day | ORAL | Status: DC
  Administered 2013-04-06 – 2013-04-17 (×12): 40 mg
  Filled 2013-04-06 (×16): qty 20

## 2013-04-06 MED ORDER — BIOTENE DRY MOUTH MT LIQD
15.0000 mL | Freq: Four times a day (QID) | OROMUCOSAL | Status: DC
  Administered 2013-04-07 – 2013-04-18 (×47): 15 mL via OROMUCOSAL

## 2013-04-06 MED ORDER — LEVOTHYROXINE SODIUM 100 MCG IV SOLR
37.5000 ug | Freq: Every day | INTRAVENOUS | Status: DC
Start: 2013-04-06 — End: 2013-04-09
  Administered 2013-04-06 – 2013-04-09 (×4): 37.5 ug via INTRAVENOUS
  Filled 2013-04-06 (×4): qty 5

## 2013-04-06 MED ORDER — SODIUM CHLORIDE 0.9 % IV SOLN
1.0000 mg/h | INTRAVENOUS | Status: DC
  Administered 2013-04-06 (×2): 2 mg/h via INTRAVENOUS
  Administered 2013-04-07 – 2013-04-08 (×2): 1 mg/h via INTRAVENOUS
  Filled 2013-04-06 (×3): qty 10

## 2013-04-06 MED ORDER — SODIUM CHLORIDE 0.9 % IV SOLN
25.0000 ug/h | INTRAVENOUS | Status: DC
  Administered 2013-04-06: 25 ug/h via INTRAVENOUS
  Administered 2013-04-06: 100 ug/h via INTRAVENOUS
  Administered 2013-04-06: 50 ug/h via INTRAVENOUS
  Administered 2013-04-07: 25 ug/h via INTRAVENOUS
  Administered 2013-04-08: 50 ug/h via INTRAVENOUS
  Administered 2013-04-10: 150 ug/h via INTRAVENOUS
  Administered 2013-04-10: 100 ug/h via INTRAVENOUS
  Administered 2013-04-11: 200 ug/h via INTRAVENOUS
  Administered 2013-04-11: 300 ug/h via INTRAVENOUS
  Administered 2013-04-11: 250 ug/h via INTRAVENOUS
  Administered 2013-04-12: 200 ug/h via INTRAVENOUS
  Administered 2013-04-12 – 2013-04-18 (×14): 300 ug/h via INTRAVENOUS
  Filled 2013-04-06 (×27): qty 50

## 2013-04-06 MED ORDER — ROCURONIUM BROMIDE 50 MG/5ML IV SOLN
INTRAVENOUS | Status: AC
  Filled 2013-04-06: qty 2

## 2013-04-06 MED ORDER — FENTANYL CITRATE 0.05 MG/ML IJ SOLN
INTRAMUSCULAR | Status: AC
  Filled 2013-04-06: qty 4

## 2013-04-06 MED ORDER — SUCCINYLCHOLINE CHLORIDE 20 MG/ML IJ SOLN
INTRAMUSCULAR | Status: AC
  Filled 2013-04-06: qty 1

## 2013-04-06 MED ORDER — HYDROCORTISONE NA SUCCINATE PF 100 MG IJ SOLR
100.0000 mg | Freq: Three times a day (TID) | INTRAMUSCULAR | Status: DC
Start: 2013-04-06 — End: 2013-04-09
  Administered 2013-04-06 – 2013-04-09 (×10): 100 mg via INTRAVENOUS
  Filled 2013-04-06 (×12): qty 2

## 2013-04-06 MED ORDER — LIDOCAINE HCL (CARDIAC) 20 MG/ML IV SOLN
INTRAVENOUS | Status: AC
  Filled 2013-04-06: qty 5

## 2013-04-06 MED ORDER — LORAZEPAM 2 MG/ML IJ SOLN
INTRAMUSCULAR | Status: AC
  Administered 2013-04-06: 1 mg
  Filled 2013-04-06: qty 1

## 2013-04-06 MED ORDER — LORAZEPAM 2 MG/ML IJ SOLN: 1.0000 mg | Freq: Once | INTRAMUSCULAR | Status: DC

## 2013-04-06 NOTE — Procedures (Signed)
Arterial Catheter Insertion Procedure Note [name] [id number] May 27, 1932  Procedure: Insertion of Arterial Catheter  Indications: Blood pressure monitoring and Frequent blood sampling  Procedure Details Consent: Risks of procedure as well as the alternatives and risks of each were explained to the (patient/caregiver).  Consent for procedure obtained. Time Out: Verified patient identification, verified procedure, site/side was marked, verified correct patient position, special equipment/implants available, medications/allergies/relevent history reviewed, required imaging and test results available.  Performed  Maximum sterile technique was used including antiseptics, cap, gloves, gown, hand hygiene, mask and sheet. Skin prep: Chlorhexidine; local anesthetic administered 20 gauge catheter was inserted into right femoral artery using the Seldinger technique.  Evaluation Blood flow good; BP tracing good. Complications: No apparent complications.   [name] Minor ACNP [facility] PCCM Pager [phone] till 3 pm If no answer page [phone] 04/06/2013, 9:38 AM   [name], MD, PhD 04/06/2013, 2:47 PM [name] [facility] [phone] or if no answer [phone]

## 2013-04-06 NOTE — Progress Notes (Addendum)
Provided support with pt's husband, Stephannie Peters, following pt's intubation.  Pt was intubated for extended period (9 days?) during previous hospitalization.   Engaged in life review, spiritual and emotional support, anticipatory grief support, goals for care, and shared prayers with family.  Stephannie Peters expressed hope for his spouse's recovery, but is recognizing that their time together is brief at this point in their lives and is beginning to grieve this.   Pt has two children.  Son in [place] and daughter (in [place]?)  Pt's husband has contacted children and pt's sister.    Chaplain will continue to follow for support during hospitalization.  Please page as needs arise.    Belva Crome, [place]  [phone]

## 2013-04-06 NOTE — Therapy (Signed)
Pt intubated and placed on ARDS protocol

## 2013-04-06 NOTE — Progress Notes (Signed)
3 rings removed form left hand silver colored band, silver band with diamond and silver colored band with 4 diamonds on band . Rings given to husband [place].

## 2013-04-06 NOTE — Progress Notes (Signed)
Pt is having difficulty tolerating BiPap. Called MD and requested medication. SPo2 not sustained on Ventimask @ 55% Fio2

## 2013-04-06 NOTE — Progress Notes (Signed)
  Comments:  [id number] Earlene Plater RN, BSN, CCM, [phone] Chart reviewed for update of needs and condition. patient has Pacemaker. [id number] of Arterial Catheter Indications: Blood pressure monitoring and Frequent blood sampling/intubated [id number]/CVP 5 mmHg/VENT DAY 1

## 2013-04-06 NOTE — Consult Note (Signed)
Name: [name] MRN: [id number] DOB: [birth year]    ADMISSION DATE:  04/04/2013 CONSULTATION DATE:  04/05/13  REFERRING MD :  Dr. Vanessa Barbara  PRIMARY SERVICE: TRH-->PCCM  CHIEF COMPLAINT:  Hypotension   BRIEF PATIENT DESCRIPTION: 80 y/o F admitted with respiratory failure / severe sepsis.    SIGNIFICANT EVENTS / STUDIES:  2/3 - Admit with fever, SOB, vomiting, diarrhea (concern for food poisoning) & CXR concerning for interstitial pneumonitis 2/3 2 d echo Ef 45% grade 1 diastolic dysfunction  LINES / TUBES: L IJ TLC 2/3>>> 2/4 OTT>> 2/4 a line>> 2/4 OGT>>  CULTURES: MRSA PCR 2/3>>> BCx2 2/3>>> Sputum 2/3>>> UA 2/3>>> Resp Viral Panel 2/3>>>neg 2/4 tracheal aspirate>> ANTIBIOTICS: Rocephin 2/3>>> Azithro 2/3>>> Tamiflu 2/3>>>2/4  HISTORY OF PRESENT ILLNESS:  80 y/o F, never smoker, with PMH of hypothyroidism, HLD, HTN, CHB / ICM s/p pacemaker who presented to [facility] ER on 2/2 with one week history of dry, minimally productive cough.  On 2/1 she ate out and after eating she began vomiting and noticed chills.  On am of 2/2 she had several episodes of diarrhea, abdominal pain and weakness.  As the day progressed, she developed fevers and shortness of breath.    In ER on arrival,  she was noted to have temp of 101, BP of 56/29, WBC 14.7, neg troponin, lactate of 5.6, sr cr of 1.81 and cxr concerning for interstitial pneumonitis.    Patient denies known sick contacts, recent travel, chest pain, pain with inspiration, headaches, syncope, hematemesis, rectal bleeding.    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    SUBJECTIVE:  Agitation, increased WOB, with hypoxia-> intubated  VITAL SIGNS: Temp:  [98.8 F (37.1 C)-100.9 F (38.3 C)] 100.9 F (38.3 C) (02/04 0830) Pulse Rate:  [69-102] 102 (02/04 0830) Resp:  [19-40] 30 (02/04 0818) BP: (87-151)/(28-93) 151/59 mmHg (02/04 0830) SpO2:  [86 %-100 %] 91  % (02/04 0830) FiO2 (%):  [55 %-100 %] 55 % (02/04 0318)  HEMODYNAMICS: CVP:  [5 mmHg-14 mmHg] 5 mmHg  VENTILATOR SETTINGS: Vent Mode:  [-] BIPAP FiO2 (%):  [55 %-100 %] 55 % Set Rate:  [15 bmp] 15 bmp PEEP:  [5 cmH20] 5 cmH20  INTAKE / OUTPUT: Intake/Output     02/03 0701 - 02/04 0700 02/04 0701 - 02/05 0700   P.O. 60    I.V. (mL/kg) 4219.8 (55.6)    IV Piggyback 410    Total Intake(mL/kg) 4689.8 (61.8)    Urine (mL/kg/hr) 2300 (1.3)    Total Output 2300     Net +2389.8            PHYSICAL EXAMINATION: General:  wdwn elderly female in acute resp distress Neuro:  AAOx4, speech clear, MAE HEENT:  Mm pink/dry, no jvd Cardiovascular:  s1s2 rrr, paced on monitor Lungs:  lungs bilaterally coarse, poor excursion Abdomen:  Round/soft, bsx4 active Musculoskeletal:  No acute deformities  Skin:  Warm/dry, no edema  LABS:  CBC  Recent Labs Lab 04/04/13 2303 04/05/13 0500 04/06/13 0400  WBC 14.7* 31.6* 20.3*  HGB 17.9* 15.5* 13.8  HCT 52.8* 46.9* 41.2  PLT PLATELET CLUMPS NOTED ON SMEAR, COUNT APPEARS ADEQUATE PLATELET CLUMPS NOTED ON SMEAR, UNABLE TO ESTIMATE PLATELET CLUMPS NOTED ON SMEAR, UNABLE TO ESTIMATE   Coag's  Recent Labs Lab 04/06/13 0400  INR 1.24   BMET  Recent Labs Lab 04/05/13 0500 04/05/13 1700 04/06/13  0400  NA 137 134* 135*  K 3.3* 4.1 3.9  CL 102 103 104  CO2 15* 17* 18*  BUN 30* 28* 25*  CREATININE 2.29* 1.97* 1.74*  GLUCOSE 227* 168* 167*   Electrolytes  Recent Labs Lab 04/05/13 0500 04/05/13 1700 04/06/13 0400  CALCIUM 6.5* 6.4* 6.7*  MG  --   --  1.6  PHOS  --   --  2.2*   Sepsis Markers  Recent Labs Lab 04/04/13 2220 04/05/13 0500 04/05/13 1700 04/06/13 0400  LATICACIDVEN 5.2* 5.6* 2.2  --   PROCALCITON  --  44.16  --  27.65   ABG  Recent Labs Lab 04/04/13 2147 04/05/13 0150 04/06/13 0626  PHART 7.420 7.365 7.367  PCO2ART 21.5* 23.2* 30.2*  PO2ART 106.0* 119.0* 57.9*   Liver Enzymes  Recent Labs Lab  04/04/13 2220 04/06/13 0400  AST 58* 130*  ALT 49* 167*  ALKPHOS 69 41  BILITOT 0.6 <0.2*  ALBUMIN 2.9* 2.0*   Cardiac Enzymes  Recent Labs Lab 04/04/13 2133 04/04/13 2224 04/05/13 2110 04/06/13 0400  TROPONINI  --  <0.30 <0.30 <0.30  PROBNP 379.5  --   --   --    Glucose  Recent Labs Lab 04/06/13 0348  GLUCAP 138*    Imaging Dg Chest Port 1 View  04/06/2013   CLINICAL DATA:  Endotracheal placement  EXAM: PORTABLE CHEST - 1 VIEW  COMPARISON:  Earlier same day  FINDINGS: Endotracheal tube has its tip 1.5 cm above the carina. Nasogastric tube enters the stomach. Right internal jugular central line as is tip in the SVC just above the right atrium. Diffuse pulmonary airspace opacity persists, which could represent a combination of pneumonia and edema. There is partial collapse of the left lower lobe.  IMPRESSION: Endotracheal tube well position 1.5 cm above the carina. Nasogastric tube enters the stomach. No other significant change. Diffuse pulmonary density could being a combination of pneumonia and/or edema.   Electronically Signed   By: Paulina Fusi M.D.   On: 04/06/2013 08:22   Dg Chest Port 1 View  04/06/2013   CLINICAL DATA:  Followup infiltrate  EXAM: PORTABLE CHEST - 1 VIEW  COMPARISON:  Chest x-ray from yesterday  FINDINGS: Right IJ catheter remains at the level of the SVC. No interval displacement of left approach biventricular pacer wires.  Unchanged heart size and mediastinal contours. Worsening aeration of the lower lung, with a hazy appearance. Bilateral interstitial and airspace opacities persist.  IMPRESSION: 1. Question developing small bilateral pleural effusion. 2. Unchanged interstitial and airspace disease.   Electronically Signed   By: Tiburcio Pea M.D.   On: 04/06/2013 06:10   Dg Chest Port 1 View  04/05/2013   CLINICAL DATA:  Line placement.  EXAM: PORTABLE CHEST - 1 VIEW  COMPARISON:  04/05/2003  FINDINGS: Right IJ central venous catheter, tip at the upper  SVC. No visible pneumothorax. Sheet and skin folds noted on the right. No interval displacement of left approach biventricular pacer wires. No cardiomegaly. Worsening bilateral interstitial and airspace disease. New opacification of the retrocardiac lung.  IMPRESSION: 1. New right IJ catheter in good position.  No pneumothorax. 2. Worsening extensive airspace disease.   Electronically Signed   By: Tiburcio Pea M.D.   On: 04/05/2013 05:07   Dg Chest Port 1 View  04/04/2013   CLINICAL DATA:  Fever and shortness of breath.  EXAM: PORTABLE CHEST - 1 VIEW  COMPARISON:  11/22/2012.  FINDINGS: Normal sized heart. Stable minimal scarring  at the left lung base. Interval mild prominence of the interstitial markings. No pleural fluid. Stable left subclavian pacer and AICD leads. Unremarkable bones.  IMPRESSION: Interval mild diffuse interstitial lung disease, suspicious for interstitial pneumonitis.   Electronically Signed   By: Gordan PaymentSteve  Reid M.D.   On: 04/04/2013 22:12    ASSESSMENT / PLAN:  PULMONARY A: Acute Respiratory Distress - concern for viral process with pneumonitis vs possible aspiration with vomiting  B infiltrates > ddx ARDS, viral pneumonitis, bacterial PNA, cardiogenic edema, consider amiodarone ARDS with intubation 2/4 P:   -ARDS protocol -Vent bundle -empiric CAP coverage -empiric viral coverage with tamiflu-> stopped 2/4 negative flu -TTE to assess LV fxn-> ef 45% grade 1 dysfunction  CARDIOVASCULAR A:  Septic Shock Hx HTN Hx CHF / ICM s/p Pacemeker - compensated AFib on amiodarone P:  -levophed for MAP >65 -consider stress steroids(cortisol 24) started 2/4 with cvp 10 on pressor  -may require additional volume pending CVP assessment (CHF / ICM) -reassess lactate (5.6->2.2) -hold oral amiodarone, pending cx data and w/u for her resp sx -hold zocor -attempts were made at both R/L radials for arterial line with inability to thread wire - CVP 10  RENAL A:   Acute Kidney  Injury  P:   -ensure adequate hydration / perfusion  -trend BMP -replace lytes as indicated   GASTROINTESTINAL A:   Nausea / Vomiting / Diarrhea P:   -if further diarrhea, assess stool for culture -PPI -zofran -2/4 ogt placed post intubation HEMATOLOGIC A:   Leukocytosis DVT Prophylaxis  P:  -heparin for DVT -monitor cbc   INFECTIOUS A:   Gastroenteritis, Diarrhea R/O CAP vs Viral Respiratory Illness; note Pct elevated 2/3 P:   -abx & cultures as above > ceftriaxone + azithro -assess UA in elderly female to r/o urine source infection (with n/v) -consider stool O&P -empiric tamiflu stopped 2/4  ENDOCRINE A:   Hypothyroidism   P:   -continue synthroid per ogt  NEUROLOGIC A:   No acute process P:   -monitor / supportive care  40 minutes CC time  Brett CanalesSteve Minor ACNP [facility] Pager [phone] till 3 pm If no answer page [phone] 04/06/2013, 8:51 AM  Levy Pupaobert Tayli Buch, MD, PhD 04/06/2013, 2:47 PM [facility] [phone] or if no answer [phone]

## 2013-04-07 ENCOUNTER — Inpatient Hospital Stay (HOSPITAL_COMMUNITY): Payer: Medicare Other

## 2013-04-07 LAB — CULTURE, RESPIRATORY W GRAM STAIN: Culture: NORMAL

## 2013-04-07 LAB — BASIC METABOLIC PANEL
BUN: 18 mg/dL (ref 6–23)
CO2: 20 mEq/L (ref 19–32)
Calcium: 6.8 mg/dL — ABNORMAL LOW (ref 8.4–10.5)
Chloride: 108 mEq/L (ref 96–112)
Creatinine, Ser: 1.19 mg/dL — ABNORMAL HIGH (ref 0.50–1.10)
GFR calc non Af Amer: 43 mL/min — ABNORMAL LOW (ref >90)
GFR, EST AFRICAN AMERICAN: 49 mL/min — AB (ref >90)
Glucose, Bld: 151 mg/dL — ABNORMAL HIGH (ref 70–99)
POTASSIUM: 4.3 meq/L (ref 3.7–5.3)
SODIUM: 138 meq/L (ref 137–147)

## 2013-04-07 LAB — PHOSPHORUS: Phosphorus: 2 mg/dL — ABNORMAL LOW (ref 2.3–4.6)

## 2013-04-07 LAB — CBC
HCT: 35.6 % — ABNORMAL LOW (ref 36.0–46.0)
Hemoglobin: 11.6 g/dL — ABNORMAL LOW (ref 12.0–15.0)
MCH: 30.4 pg (ref 26.0–34.0)
MCHC: 32.6 g/dL (ref 30.0–36.0)
MCV: 93.4 fL (ref 78.0–100.0)
Platelets: UNDETERMINED 10*3/uL (ref 150–400)
RBC: 3.81 MIL/uL — ABNORMAL LOW (ref 3.87–5.11)
RDW: 15 % (ref 11.5–15.5)
WBC: 17.5 10*3/uL — AB (ref 4.0–10.5)

## 2013-04-07 LAB — CULTURE, RESPIRATORY

## 2013-04-07 LAB — MAGNESIUM: MAGNESIUM: 1.8 mg/dL (ref 1.5–2.5)

## 2013-04-07 LAB — PROCALCITONIN: PROCALCITONIN: 13 ng/mL

## 2013-04-07 MED ORDER — OXEPA PO LIQD
1000.0000 mL | ORAL | Status: DC
  Administered 2013-04-07: 1000 mL
  Filled 2013-04-07: qty 1000

## 2013-04-07 MED ORDER — SODIUM PHOSPHATE 3 MMOLE/ML IV SOLN
10.0000 mmol | Freq: Once | INTRAVENOUS | Status: AC
  Administered 2013-04-07: 10 mmol via INTRAVENOUS
  Filled 2013-04-07 (×2): qty 3.33

## 2013-04-07 MED ORDER — ADULT MULTIVITAMIN LIQUID CH
5.0000 mL | Freq: Every day | ORAL | Status: DC
  Administered 2013-04-07 – 2013-04-18 (×12): 5 mL
  Filled 2013-04-07 (×12): qty 5

## 2013-04-07 NOTE — Progress Notes (Signed)
INITIAL NUTRITION ASSESSMENT  DOCUMENTATION CODES Per approved criteria  -Not Applicable   INTERVENTION: - Initiate TF of Oxepa via OGT start at 25ml/hr increase by 71ml every 12 hours to goal of 37ml/hr. Will add Prostat 60ml TID to TF once phosphorus WNL. TF advancing slowly due to pt at refeeding risk with low phosphorus. Goal rate will provide 1740 calories, 105g protein, free water and meet 106% estimated calorie needs, 100% estimated protein needs - Multivitamin 40ml daily via OGT - Recommend MD replace low phosphorus. Will continue to monitor refeeding labs (potassium, magnesium, and phosphorus) once TF started.  - Initiate adult enteral protocol - Will continue to monitor   NUTRITION DIAGNOSIS: Inadequate oral intake related to inability to eat as evidenced by NPO.   Goal: 1. Refeeding labs WNL 2. TF to meet >90% of estimated nutritional needs  Monitor:  Weights, labs, TF tolerance/advancement, vent status   Reason for Assessment: Ventilated pt, consult for TF management  80 y.o. female  Admitting Dx: Sepsis  ASSESSMENT: Pt started having vomiting 04/03/13. She thought she had food poisoning and continued to vomiting throughout the day. She vomited at least a dozen times. She stopped vomiting around 10 am but then started having diarrhea, multiple episodes. As the day progressed she started feeling short of breath. She also developed chills and fever. Slight cough for the last few days. She did start to feel lightheaded. Hx of stage 3 CKD, HTN, hyperlipidemia, and hypothyroidism. Found to have acute hypoxemic respiratory failure, sepsis, pneumonia, and acute on chronic renal failure. Pt intubated yesterday for acute respiratory distress and placed on ARDS protocol. Pt alone in room, wearing mittens. Suspect pt with inadequate oral intake since 2/1 due to vomiting/diarrhea.   Patient is currently intubated on ventilator support.  MV: 8.3 L/min Temp (24hrs), Avg:98.7 F  (37.1 C), Min:96.6 F (35.9 C), Max:101.3 F (38.5 C)  Propofol: off  Potassium and magnesium WNL. Potassium was low earlier in admission however got replaced.  Phosphorus low AST/ALT elevated and trending up   Height: Ht Readings from Last 1 Encounters:  04/06/13 5\' 3"  (1.6 m)    Weight: 04/05/13         167 lb (75.9 kg)  Ideal Body Weight: 115 lb   % Ideal Body Weight: 153%  Wt Readings from Last 10 Encounters:  04/07/13 176 lb 12.9 oz (80.2 kg)  11/04/12 161 lb 12.8 oz (73.392 kg)  04/10/12 154 lb (69.854 kg)  08/27/11 166 lb 6.4 oz (75.479 kg)  03/05/11 169 lb (76.658 kg)  02/11/11 168 lb 3.2 oz (76.295 kg)  05/23/09 169 lb (76.658 kg)  04/25/09 169 lb (76.658 kg)  03/23/09 174 lb (78.926 kg)  11/09/08 173 lb (78.472 kg)    Usual Body Weight: 161 lb in September 2014  % Usual Body Weight: 109%  BMI:  29.6 kg/(m^2)  Estimated Nutritional Needs: Kcal: 1640 Protein: 80-105g Fluid: >1.6L/day   Skin: +1 generalized edema, +1 RUE edema, +2 LUE edema  Diet Order: NPO  EDUCATION NEEDS: -No education needs identified at this time   Intake/Output Summary (Last 24 hours) at 04/07/13 0913 Last data filed at 04/07/13 0800  Gross per 24 hour  Intake 3222.61 ml  Output   1300 ml  Net 1922.61 ml    Last BM: PTA  Labs:   Recent Labs Lab 04/05/13 1700 04/06/13 0400 04/07/13 0515  NA 134* 135* 138  K 4.1 3.9 4.3  CL 103 104 108  CO2 17* 18*  20  BUN 28* 25* 18  CREATININE 1.97* 1.74* 1.19*  CALCIUM 6.4* 6.7* 6.8*  MG  --  1.6 1.8  PHOS  --  2.2* 2.0*  GLUCOSE 168* 167* 151*    CBG (last 3)   Recent Labs  04/06/13 0348  GLUCAP 138*    Scheduled Meds: . antiseptic oral rinse  15 mL Mouth Rinse QID  . aspirin  325 mg Per Tube Daily  . azithromycin  500 mg Intravenous Q24H  . cefTRIAXone (ROCEPHIN)  IV  1 g Intravenous Q24H  . chlorhexidine  15 mL Mouth Rinse BID  . heparin  5,000 Units Subcutaneous Q8H  . hydrocortisone sod succinate  (SOLU-CORTEF) inj  100 mg Intravenous Q8H  . levothyroxine  37.5 mcg Intravenous Daily  . LORazepam  1 mg Intravenous Once  . pantoprazole sodium  40 mg Per Tube Q1200  . potassium chloride  40 mEq Oral Once  . sodium chloride  500 mL Intravenous Once    Continuous Infusions: . sodium chloride 100 mL/hr at 04/06/13 2158  . fentaNYL infusion INTRAVENOUS 50 mcg/hr (04/07/13 0600)  . midazolam (VERSED) infusion 1 mg/hr (04/07/13 0600)  . norepinephrine (LEVOPHED) Adult infusion Stopped (04/07/13 0730)    Past Medical History  Diagnosis Date  . Hypothyroid   . Complete heart block 2002    Initial PPM 2002 with upgrade by JA to CRT-P 11/21/08  . Nonischemic cardiomyopathy   . Persistent atrial fibrillation   . Hypertension   . Hyperlipidemia   . Hives 4/10  . Cataract 4/13    right  . Renal failure     3rd stage-seeing neurologist    Past Surgical History  Procedure Laterality Date  . Pacemaker insertion  2002, 2010    PPM 2002, upgrade to CRT-P by Dr Johney FrameAllred (MDT 2010)  . Partial colectomy  1/04  . Abdominal hysterectomy  1987    TAH  . Bunionectomy Right 1996  . Breast biopsy Left 01/1997  . Cataract extraction  4/13    both eyes  . Cyst excision      on back of left ear    Levon HedgerHeather Baron MS, RD, LDN [phone] Pager [phone] After Hours Pager

## 2013-04-07 NOTE — Progress Notes (Signed)
Resp. Therapy notified of ETT in Main St[facility] per Radiologist.

## 2013-04-07 NOTE — Progress Notes (Signed)
[facility]    Name: [name] MRN: [id number] DOB: August 02, 1932    ADMISSION DATE:  04/04/2013 CONSULTATION DATE:  04/05/13  REFERRING MD :  Dr. Vanessa Barbara  PRIMARY SERVICE: TRH-->PCCM  CHIEF COMPLAINT:  Hypotension   BRIEF PATIENT DESCRIPTION: 80 y/o F admitted 2/2 with respiratory failure / severe sepsis.    SIGNIFICANT EVENTS / STUDIES:  2/3 - Admit with fever, SOB, vomiting, diarrhea (concern for food poisoning) & CXR concerning for interstitial pneumonitis 2/3 2 d echo>>>Ef 45% grade 1 diastolic dysfunction   LINES / TUBES: L IJ TLC 2/3>>> 2/4 OTT>> 2/4 a line>> 2/4 OGT>>  CULTURES: MRSA PCR 2/3>>> BCx2 2/3>>> Sputum 2/3>>>  UA 2/3>>> Resp Viral Panel 2/3>>>neg 2/4 tracheal aspirate>>normal flora Stool culture, o&p 2/5>>>  ANTIBIOTICS: Rocephin 2/3>>> Azithro 2/3>>> Tamiflu 2/3>>>2/4   SUBJECTIVE:  No change overnight. Easily desats with stimulation per nsg. No SBT this am. Off pressors since 6am.    VITAL SIGNS: Temp:  [96.6 F (35.9 C)-101.5 F (38.6 C)] 97.2 F (36.2 C) (02/05 0830) Pulse Rate:  [70-82] 70 (02/05 0830) Resp:  [17-31] 20 (02/05 0830) BP: (101-113)/(33-60) 113/52 mmHg (02/05 0327) SpO2:  [87 %-97 %] 95 % (02/05 0830) Arterial Line BP: (93-123)/(42-92) 114/47 mmHg (02/05 0830) FiO2 (%):  [40 %-70 %] 50 % (02/05 0810) Weight:  [176 lb 12.9 oz (80.2 kg)] 176 lb 12.9 oz (80.2 kg) (02/05 0653)  HEMODYNAMICS: CVP:  [11 mmHg-13 mmHg] 11 mmHg  VENTILATOR SETTINGS: Vent Mode:  [-] PRVC FiO2 (%):  [40 %-70 %] 50 % Set Rate:  [18 bmp-22 bmp] 22 bmp Vt Set:  [310 mL-370 mL] 310 mL PEEP:  [5 cmH20-10 cmH20] 8 cmH20 Plateau Pressure:  [10 cmH20-25 cmH20] 17 cmH20  INTAKE / OUTPUT: Intake/Output     02/04 0701 - 02/05 0700 02/05 0701 - 02/06 0700   P.O.     I.V. (mL/kg) 2849.1 (35.5) 165.8 (2.1)   NG/GT 60    IV Piggyback 300    Total Intake(mL/kg) 3209.1 (40) 165.8 (2.1)   Urine (mL/kg/hr) 1300 (0.7)    Total Output 1300     Net  +1909.1 +165.8          PHYSICAL EXAMINATION: General:  wdwn elderly female in NAD  Neuro:  Sedated on vent, RASS -1 HEENT:  Mm pink/dry, no jvd Cardiovascular:  s1s2 rrr, paced on monitor Lungs:  resps even non labored on vent, diminished L, few scattered rhonchi  Abdomen:  Round/soft, bsx4 active Musculoskeletal:  No acute deformities  Skin:  Warm/dry, no edema  LABS:  CBC  Recent Labs Lab 04/05/13 0500 04/06/13 0400 04/07/13 0515  WBC 31.6* 20.3* 17.5*  HGB 15.5* 13.8 11.6*  HCT 46.9* 41.2 35.6*  PLT PLATELET CLUMPS NOTED ON SMEAR, UNABLE TO ESTIMATE PLATELET CLUMPS NOTED ON SMEAR, UNABLE TO ESTIMATE PLATELET CLUMPS NOTED ON SMEAR, UNABLE TO ESTIMATE   Coag's  Recent Labs Lab 04/06/13 0400  INR 1.24   BMET  Recent Labs Lab 04/05/13 1700 04/06/13 0400 04/07/13 0515  NA 134* 135* 138  K 4.1 3.9 4.3  CL 103 104 108  CO2 17* 18* 20  BUN 28* 25* 18  CREATININE 1.97* 1.74* 1.19*  GLUCOSE 168* 167* 151*   Electrolytes  Recent Labs Lab 04/05/13 1700 04/06/13 0400 04/07/13 0515  CALCIUM 6.4* 6.7* 6.8*  MG  --  1.6 1.8  PHOS  --  2.2* 2.0*   Sepsis Markers  Recent Labs Lab 04/04/13 2220 04/05/13 0500 04/05/13 1700 04/06/13  0400 04/07/13 0515  LATICACIDVEN 5.2* 5.6* 2.2  --   --   PROCALCITON  --  44.16  --  27.65 13.00   ABG  Recent Labs Lab 04/06/13 0626 04/06/13 0900 04/06/13 1242  PHART 7.367 7.313* 7.324*  PCO2ART 30.2* 35.3 35.0  PO2ART 57.9* 68.4* 71.3*   Liver Enzymes  Recent Labs Lab 04/04/13 2220 04/06/13 0400  AST 58* 130*  ALT 49* 167*  ALKPHOS 69 41  BILITOT 0.6 <0.2*  ALBUMIN 2.9* 2.0*   Cardiac Enzymes  Recent Labs Lab 04/04/13 2133  04/05/13 2110 04/06/13 0400 04/06/13 1210  TROPONINI  --   < > <0.30 <0.30 <0.30  PROBNP 379.5  --   --   --   --   < > = values in this interval not displayed. Glucose  Recent Labs Lab 04/06/13 0348  GLUCAP 138*    Imaging Dg Chest Port 1 View  04/07/2013    CLINICAL DATA:  Intubation.  EXAM: PORTABLE CHEST - 1 VIEW  COMPARISON:  DG CHEST 1V PORT dated 04/06/2013  FINDINGS: Endotracheal tube is noted in the upper most portion of the right mainstem bronchus. NG tube noted with its tip below the left hemidiaphragm. Right IJ line noted in good anatomic position. Heart size stable. Cardiac pacer with lead tips in the right atrium and right ventricle. Diffuse bilateral persistent airspace disease noted. Persistent atelectasis left lower lobe. No pneumothorax or acute osseous abnormality.  IMPRESSION: 1. Endotracheal tube noted in the orifice of the right mainstem bronchus, proximal repositioning suggested. Line and tube positions otherwise stable. 2. Persistent bilateral airspace disease and left lower lobe atelectasis. These results were called by telephone at the time of interpretation on 04/07/2013 at 7:14 AM to nurse Ruby, who verbally acknowledged these results.   Electronically Signed   By: Maisie Fushomas  Register   On: 04/07/2013 07:15   Dg Chest Port 1 View  04/06/2013   CLINICAL DATA:  Endotracheal placement  EXAM: PORTABLE CHEST - 1 VIEW  COMPARISON:  Earlier same day  FINDINGS: Endotracheal tube has its tip 1.5 cm above the carina. Nasogastric tube enters the stomach. Right internal jugular central line as is tip in the SVC just above the right atrium. Diffuse pulmonary airspace opacity persists, which could represent a combination of pneumonia and edema. There is partial collapse of the left lower lobe.  IMPRESSION: Endotracheal tube well position 1.5 cm above the carina. Nasogastric tube enters the stomach. No other significant change. Diffuse pulmonary density could being a combination of pneumonia and/or edema.   Electronically Signed   By: Paulina FusiMark  Shogry M.D.   On: 04/06/2013 08:22   Dg Chest Port 1 View  04/06/2013   CLINICAL DATA:  Followup infiltrate  EXAM: PORTABLE CHEST - 1 VIEW  COMPARISON:  Chest x-ray from yesterday  FINDINGS: Right IJ catheter remains at the  level of the SVC. No interval displacement of left approach biventricular pacer wires.  Unchanged heart size and mediastinal contours. Worsening aeration of the lower lung, with a hazy appearance. Bilateral interstitial and airspace opacities persist.  IMPRESSION: 1. Question developing small bilateral pleural effusion. 2. Unchanged interstitial and airspace disease.   Electronically Signed   By: Tiburcio PeaJonathan  Watts M.D.   On: 04/06/2013 06:10   Dg Abd Portable 1v  04/06/2013   CLINICAL DATA:  Nasogastric tube placement.  Shortness of breath.  EXAM: PORTABLE ABDOMEN - 1 VIEW  COMPARISON:  DG ABD PORTABLE 1V dated 03/11/2010  FINDINGS: Nasogastric tube terminates in  the stomach with the side port well beyond the gastroesophageal junction. Bowel gas pattern is grossly unremarkable.  IMPRESSION: Nasogastric tube terminates in the stomach.   Electronically Signed   By: Leanna Battles M.D.   On: 04/06/2013 08:52    ASSESSMENT / PLAN:  PULMONARY A: Acute Respiratory Distress - concern for viral process with pneumonitis vs possible aspiration with vomiting  B infiltrates > ddx ARDS, viral pneumonitis, bacterial PNA, cardiogenic edema, consider amiodarone ARDS  P:   -continue ARDS protocol -Vent bundle -empiric CAP coverage -f/u CXR   CARDIOVASCULAR A:  Septic Shock Hx HTN Hx CHF / ICM s/p Pacemeker - compensated -- EF 45% grade 1 dysfunction AFib on amiodarone P:  -hold oral amiodarone, pending cx data and w/u for her resp sx -hold zocor -consider gentle diuresis if remains off pressors  RENAL A:   Acute Kidney Injury  P:   -ensure adequate hydration / perfusion  -trend BMP -replace lytes as indicated    GASTROINTESTINAL A:   Nausea / Vomiting / Diarrhea P:   -stool for culture, o&p -PPI -zofran PRN  -start TF 2/5 per nutrition    HEMATOLOGIC A:   Leukocytosis DVT Prophylaxis  P:  -heparin for DVT -monitor cbc   INFECTIOUS A:   Gastroenteritis, Diarrhea R/O CAP vs  Viral Respiratory Illness; note Pct elevated but trending down (44->27->14) RVP neg - off tamiflu  UA neg  P:   -abx & cultures as above > ceftriaxone + azithro -stool O&P  ENDOCRINE A:   Hypothyroidism   P:   -continue synthroid per ogt  NEUROLOGIC A:   No acute process P:   -cont low dose fent/versed gtts  -daily WUA   Family updated at bedside 2/5  [facility], NP 04/07/2013  9:53 AM Pager: [phone] or [phone]  *Care during the described time interval was provided by me and/or other providers on the critical care team. I have reviewed this patient's available data, including medical history, events of note, physical examination and test results as part of my evaluation.  CC time 40 minutes  [name], MD, PhD 04/07/2013, 11:56 AM [facility] [phone] or if no answer [phone]

## 2013-04-08 ENCOUNTER — Inpatient Hospital Stay (HOSPITAL_COMMUNITY): Payer: Medicare Other

## 2013-04-08 LAB — CBC
HEMATOCRIT: 33.2 % — AB (ref 36.0–46.0)
HEMOGLOBIN: 10.9 g/dL — AB (ref 12.0–15.0)
MCH: 30.5 pg (ref 26.0–34.0)
MCHC: 32.8 g/dL (ref 30.0–36.0)
MCV: 93 fL (ref 78.0–100.0)
Platelets: UNDETERMINED 10*3/uL (ref 150–400)
RBC: 3.57 MIL/uL — AB (ref 3.87–5.11)
RDW: 14.9 % (ref 11.5–15.5)
WBC: 15.2 10*3/uL — ABNORMAL HIGH (ref 4.0–10.5)

## 2013-04-08 LAB — BASIC METABOLIC PANEL
BUN: 19 mg/dL (ref 6–23)
CHLORIDE: 108 meq/L (ref 96–112)
CO2: 21 meq/L (ref 19–32)
Calcium: 7.4 mg/dL — ABNORMAL LOW (ref 8.4–10.5)
Creatinine, Ser: 1.03 mg/dL (ref 0.50–1.10)
GFR calc Af Amer: 59 mL/min — ABNORMAL LOW (ref >90)
GFR calc non Af Amer: 51 mL/min — ABNORMAL LOW (ref >90)
GLUCOSE: 127 mg/dL — AB (ref 70–99)
POTASSIUM: 4.1 meq/L (ref 3.7–5.3)
SODIUM: 139 meq/L (ref 137–147)

## 2013-04-08 LAB — PHOSPHORUS: PHOSPHORUS: 2.1 mg/dL — AB (ref 2.3–4.6)

## 2013-04-08 LAB — CULTURE, RESPIRATORY W GRAM STAIN
Culture: NO GROWTH
Special Requests: NORMAL

## 2013-04-08 LAB — GLUCOSE, CAPILLARY
GLUCOSE-CAPILLARY: 121 mg/dL — AB (ref 70–99)
Glucose-Capillary: 126 mg/dL — ABNORMAL HIGH (ref 70–99)
Glucose-Capillary: 144 mg/dL — ABNORMAL HIGH (ref 70–99)
Glucose-Capillary: 147 mg/dL — ABNORMAL HIGH (ref 70–99)
Glucose-Capillary: 148 mg/dL — ABNORMAL HIGH (ref 70–99)
Glucose-Capillary: 160 mg/dL — ABNORMAL HIGH (ref 70–99)

## 2013-04-08 LAB — MAGNESIUM: Magnesium: 2.1 mg/dL (ref 1.5–2.5)

## 2013-04-08 MED ORDER — PRO-STAT SUGAR FREE PO LIQD
30.0000 mL | Freq: Three times a day (TID) | ORAL | Status: DC
  Administered 2013-04-08 – 2013-04-17 (×30): 30 mL
  Filled 2013-04-08 (×33): qty 30

## 2013-04-08 MED ORDER — FUROSEMIDE 10 MG/ML IJ SOLN
40.0000 mg | Freq: Two times a day (BID) | INTRAMUSCULAR | Status: AC
  Administered 2013-04-08 – 2013-04-09 (×4): 40 mg via INTRAVENOUS
  Filled 2013-04-08 (×5): qty 4

## 2013-04-08 MED ORDER — OXEPA PO LIQD
1000.0000 mL | ORAL | Status: DC
  Administered 2013-04-08 – 2013-04-10 (×3): 1000 mL
  Filled 2013-04-08 (×5): qty 1000

## 2013-04-08 MED ORDER — SODIUM PHOSPHATE 3 MMOLE/ML IV SOLN
10.0000 mmol | Freq: Once | INTRAVENOUS | Status: AC
  Administered 2013-04-08: 10 mmol via INTRAVENOUS
  Filled 2013-04-08: qty 3.33

## 2013-04-08 NOTE — Progress Notes (Signed)
Chaplain follow up for continued support with pt's spouse.

## 2013-04-08 NOTE — Progress Notes (Signed)
Wasted fentanyl 50 ml and versed 20 ml.  Placed new bags.

## 2013-04-08 NOTE — Progress Notes (Signed)
NUTRITION FOLLOW UP  Intervention:   - Add Prostat 6ml TID via OGT (provides a total of 300 calories, 45g protein) - Increase TF of Oxepa via OGT at 72ml/hr by 84ml every 24 hours to goal of 96ml/hr. Goal rate with Prostat 70ml TID will provide 1380 calories, 90g protein, and 558ml free water and meet 106% estimated calorie needs and 100% estimated protein needs. TF advancing slowly due to refeeding risk and pt's low phosphorus level.  - Recommend close monitoring and repletion of refeeding labs - phosphorus, potassium, and magnesium, especially as TF advances - Will continue to monitor   Nutrition Dx:   Inadequate oral intake related to inability to eat as evidenced by NPO - ongoing   Goal:   1. Refeeding labs WNL - not met, phosphorus low however is getting replaced  2. TF to meet >90% of estimated nutritional needs - not met   Monitor:   Weights, labs, TF tolerance/advancement, refeeding labs, vent status  Assessment:   Pt started having vomiting 04/03/13. She thought she had food poisoning and continued to vomiting throughout the day. She vomited at least a dozen times. She stopped vomiting around 10 am but then started having diarrhea, multiple episodes. As the day progressed she started feeling short of breath. She also developed chills and fever. Slight cough for the last few days. She did start to feel lightheaded. Hx of stage 3 CKD, HTN, hyperlipidemia, and hypothyroidism. Found to have acute hypoxemic respiratory failure, sepsis, pneumonia, and acute on chronic renal failure.   2/5 - Pt intubated yesterday for acute respiratory distress and placed on ARDS protocol. Pt alone in room, wearing mittens. Suspect pt with inadequate oral intake since 2/1 due to vomiting/diarrhea.   2/6 - Remains intubated. Started on TF via OGT of Oxepa at 69ml/hr. Met with RN who reports pt tolerating TF well with only 24ml residuals. Phosphorus low, potassium and magnesium WNL. Discussed with pharmacist.  Pt getting sodium phosphate IV replacement today, expect phosphorus will improve with replacement. Pt's weight up 20 pounds since admission, getting Lasix today.   Patient is currently intubated on ventilator support.  MV: 8.7 L/min Temp (24hrs), Avg:97.1 F (36.2 C), Min:96.6 F (35.9 C), Max:97.5 F (36.4 C)  Propofol: off   Potassium  Date/Time Value Range Status  04/08/2013  4:30 AM 4.1  3.7 - 5.3 mEq/L Final  04/07/2013  5:15 AM 4.3  3.7 - 5.3 mEq/L Final  04/06/2013  4:00 AM 3.9  3.7 - 5.3 mEq/L Final    Phosphorus  Date/Time Value Range Status  04/08/2013  4:30 AM 2.1* 2.3 - 4.6 mg/dL Final  04/07/2013  5:15 AM 2.0* 2.3 - 4.6 mg/dL Final  04/06/2013  4:00 AM 2.2* 2.3 - 4.6 mg/dL Final    Magnesium  Date/Time Value Range Status  04/08/2013  4:30 AM 2.1  1.5 - 2.5 mg/dL Final  04/07/2013  5:15 AM 1.8  1.5 - 2.5 mg/dL Final  04/06/2013  4:00 AM 1.6  1.5 - 2.5 mg/dL Final     Height: Ht Readings from Last 1 Encounters:  04/06/13 $RemoveB'5\' 3"'jzuIcrhg$  (1.6 m)    Weight Status:   Wt Readings from Last 1 Encounters:  04/08/13 187 lb 13.3 oz (85.2 kg)  Admit wt:        167 lb 5.3 oz (75.9 kg)  Net I/Os: +7.6L  Re-estimated needs:  Kcal: 1301 Protein: 80-105g Fluid: >1.3L/day   Skin: +2 generalized edema, +1 RUE edema, +2 LUE edema  Diet Order:  NPO   Intake/Output Summary (Last 24 hours) at 04/08/13 0948 Last data filed at 04/08/13 0900  Gross per 24 hour  Intake 2294.66 ml  Output    975 ml  Net 1319.66 ml    Last BM: PTA   Labs:   Recent Labs Lab 04/06/13 0400 04/07/13 0515 04/08/13 0430  NA 135* 138 139  K 3.9 4.3 4.1  CL 104 108 108  CO2 18* 20 21  BUN 25* 18 19  CREATININE 1.74* 1.19* 1.03  CALCIUM 6.7* 6.8* 7.4*  MG 1.6 1.8 2.1  PHOS 2.2* 2.0* 2.1*  GLUCOSE 167* 151* 127*    CBG (last 3)   Recent Labs  04/08/13 0006 04/08/13 0348 04/08/13 0750  GLUCAP 126* 121* 144*    Scheduled Meds: . antiseptic oral rinse  15 mL Mouth Rinse QID  . aspirin  325 mg  Per Tube Daily  . azithromycin  500 mg Intravenous Q24H  . cefTRIAXone (ROCEPHIN)  IV  1 g Intravenous Q24H  . chlorhexidine  15 mL Mouth Rinse BID  . feeding supplement (PRO-STAT SUGAR FREE 64)  30 mL Per Tube TID  . furosemide  40 mg Intravenous Q12H  . heparin  5,000 Units Subcutaneous Q8H  . hydrocortisone sod succinate (SOLU-CORTEF) inj  100 mg Intravenous Q8H  . levothyroxine  37.5 mcg Intravenous Daily  . LORazepam  1 mg Intravenous Once  . multivitamin  5 mL Per Tube Daily  . pantoprazole sodium  40 mg Per Tube Q1200  . potassium chloride  40 mEq Oral Once  . sodium chloride  500 mL Intravenous Once  . sodium phosphate  Dextrose 5% IVPB  10 mmol Intravenous Once    Continuous Infusions: . sodium chloride 20 mL/hr at 04/07/13 1427  . feeding supplement (OXEPA) 1,000 mL (04/08/13 0700)  . fentaNYL infusion INTRAVENOUS 25 mcg/hr (04/08/13 0900)  . midazolam (VERSED) infusion Stopped (04/08/13 0845)    Mikey College MS, [place], [place] Pager [phone] After Hours Pager

## 2013-04-08 NOTE — Progress Notes (Signed)
CARE MANAGEMENT NOTE 04/08/2013  Patient:  [name], [name]   Account Number:  [id number]  Date Initiated:  04/05/2013  Documentation initiated by:  DAVIS,RHONDA  Subjective/Objective Assessment:   pt with hx of resp failure admitted with hypoxia and requiring bipap.     Action/Plan:   lives at home with spouse , no known history of hhc or o2 at home.   Anticipated DC Date:  04/11/2013   Anticipated DC Plan:  HOME/SELF CARE  In-house referral  NA      DC Planning Services  NA      [facility] Choice  NA   Choice offered to / List presented to:  NA   DME arranged  NA      DME agency  NA     HH arranged  NA      HH agency  NA   Status of service:  In process, will continue to follow Medicare Important Message given?  NA - LOS <3 / Initial given by admissions (If response is "NO", the following Medicare IM given date fields will be blank) Date Medicare IM given:   Date Additional Medicare IM given:    Discharge Disposition:    Per UR Regulation:  Reviewed for med. necessity/level of care/duration of stay  If discussed at Long Length of Stay Meetings, dates discussed:    Comments:  [id number]/Rhonda Earlene Plater RN, BSN, CCM, [phone] Chart reviewed for update of needs and condition. vent day3/ weaning process started this MO/LM78 down to 40% and peep down to 8.   [id number] Earlene Plater, RN, BSN, [place], [phone] Chart reviewed for update of needs and condition. patient has Pacemaker. [id number] of Arterial Catheter Indications: Blood pressure monitoring and Frequent blood sampling/intubated [id number]/CVP 5 mmHg/VENT DAY 1     [id number]/Rhonda Earlene Plater, RN, BSN, [place] [phone] Chart Reviewed for discharge and hospital needs. Discharge needs at time of review:  None present will follow for needs. Review of patient progress due on [id number].

## 2013-04-08 NOTE — Progress Notes (Signed)
[place] [facility]    Name: [name] MRN: [id number] DOB: 08/05/1932    ADMISSION DATE:  04/04/2013 CONSULTATION DATE:  04/05/13  REFERRING MD :  Dr. Vanessa Barbara  PRIMARY SERVICE: [facility]-->PCCM  CHIEF COMPLAINT:  Hypotension   BRIEF PATIENT DESCRIPTION: 80 y/o F admitted 2/2 with respiratory failure, hypovolemic and presumed septic shock, source unclear  SIGNIFICANT EVENTS / STUDIES:  2/3 - Admit with fever, SOB, vomiting, diarrhea (concern for food poisoning) & CXR concerning for interstitial pneumonitis 2/3 2 d echo>>>Ef 45% grade 1 diastolic dysfunction   LINES / TUBES: L IJ TLC 2/3>>> 2/4 OTT>> 2/4 a line>> 2/4 OGT>>  CULTURES: MRSA PCR 2/3>>> negative BCx2 2/3>>> Sputum 2/3>>> normal flora UA 2/3>>> negative nit and LE Resp Viral Panel 2/3>>>negative 2/4 tracheal aspirate>>normal flora Stool culture, o&p 2/5>>>  ANTIBIOTICS: Rocephin 2/3>>> Azithro 2/3>>> Tamiflu 2/3>>>2/4  SUBJECTIVE:  PEEP weaned to 8, fiO2 to 0.40  VITAL SIGNS: Temp:  [96.6 F (35.9 C)-97.5 F (36.4 C)] 97.3 F (36.3 C) (02/06 0500) Pulse Rate:  [69-70] 70 (02/06 0755) Resp:  [15-28] 24 (02/06 0755) BP: (106-139)/(46-61) 113/46 mmHg (02/06 0755) SpO2:  [89 %-99 %] 89 % (02/06 0755) Arterial Line BP: (101-134)/(45-74) 114/49 mmHg (02/06 0500) FiO2 (%):  [40 %-50 %] 40 % (02/06 0755) Weight:  [85.2 kg (187 lb 13.3 oz)] 85.2 kg (187 lb 13.3 oz) (02/06 0400)  HEMODYNAMICS: CVP:  [10 mmHg-17 mmHg] 14 mmHg  VENTILATOR SETTINGS: Vent Mode:  [-] PRVC FiO2 (%):  [40 %-50 %] 40 % Set Rate:  [22 bmp] 22 bmp Vt Set:  [310 mL] 310 mL PEEP:  [8 cmH20] 8 cmH20 Plateau Pressure:  [12 cmH20-17 cmH20] 12 cmH20  INTAKE / OUTPUT: Intake/Output     02/05 0701 - 02/06 0700 02/06 0701 - 02/07 0700   I.V. (mL/kg) 1542.3 (18.1)    NG/GT 336.3    IV Piggyback 553.3    Total Intake(mL/kg) 2431.9 (28.5)    Urine (mL/kg/hr) 975 (0.5)    Total Output 975     Net +1456.9            PHYSICAL  EXAMINATION: General:  wdwn elderly female in NAD  Neuro:  Sedated on vent, RASS -1 HEENT:  Mm pink/dry, no jvd Cardiovascular:  s1s2 rrr, paced on monitor, R foot cooler with weaker pulses than L Lungs:  resps even non labored on vent, diminished L, B basilar insp crackles Abdomen:  Round/soft, bsx4 active Musculoskeletal:  No acute deformities  Skin:  Warm/dry, no edema  LABS:  CBC  Recent Labs Lab 04/06/13 0400 04/07/13 0515 04/08/13 0430  WBC 20.3* 17.5* 15.2*  HGB 13.8 11.6* 10.9*  HCT 41.2 35.6* 33.2*  PLT PLATELET CLUMPS NOTED ON SMEAR, UNABLE TO ESTIMATE PLATELET CLUMPS NOTED ON SMEAR, UNABLE TO ESTIMATE PLATELET CLUMPS NOTED ON SMEAR, UNABLE TO ESTIMATE   Coag's  Recent Labs Lab 04/06/13 0400  INR 1.24   BMET  Recent Labs Lab 04/06/13 0400 04/07/13 0515 04/08/13 0430  NA 135* 138 139  K 3.9 4.3 4.1  CL 104 108 108  CO2 18* 20 21  BUN 25* 18 19  CREATININE 1.74* 1.19* 1.03  GLUCOSE 167* 151* 127*   Electrolytes  Recent Labs Lab 04/06/13 0400 04/07/13 0515 04/08/13 0430  CALCIUM 6.7* 6.8* 7.4*  MG 1.6 1.8 2.1  PHOS 2.2* 2.0* 2.1*   Sepsis Markers  Recent Labs Lab 04/04/13 2220 04/05/13 0500 04/05/13 1700 04/06/13 0400 04/07/13 0515  LATICACIDVEN 5.2* 5.6* 2.2  --   --  PROCALCITON  --  44.16  --  27.65 13.00   ABG  Recent Labs Lab 04/06/13 0626 04/06/13 0900 04/06/13 1242  PHART 7.367 7.313* 7.324*  PCO2ART 30.2* 35.3 35.0  PO2ART 57.9* 68.4* 71.3*   Liver Enzymes  Recent Labs Lab 04/04/13 2220 04/06/13 0400  AST 58* 130*  ALT 49* 167*  ALKPHOS 69 41  BILITOT 0.6 <0.2*  ALBUMIN 2.9* 2.0*   Cardiac Enzymes  Recent Labs Lab 04/04/13 2133  04/05/13 2110 04/06/13 0400 04/06/13 1210  TROPONINI  --   < > <0.30 <0.30 <0.30  PROBNP 379.5  --   --   --   --   < > = values in this interval not displayed. Glucose  Recent Labs Lab 04/06/13 0348 04/08/13 0006 04/08/13 0348 04/08/13 0750  GLUCAP 138* 126* 121*  144*    Imaging Dg Chest Port 1 View  04/08/2013   CLINICAL DATA:  Followup respiratory failure  EXAM: PORTABLE CHEST - 1 VIEW  COMPARISON:  Chest x-ray from yesterday  FINDINGS: Lower endotracheal tube, now 1.5 cm above the carina. An orogastric tube continues into the stomach. Right IJ catheter in stable position. No interval displacement of biventricular pacer wires. Persistent hazy appearance of the chest consistent with effusions. There is airspace disease, most confluent in the upper lungs, left more than right. No evidence of pneumothorax.  IMPRESSION: 1. Lower endotracheal tube, but still in good position. The tube tip is now 1.6 cm above the carina. 2. Diffuse lung opacity is not significantly changed, likely a combination of edema and pneumonia. 3. Layering bilateral pleural effusions which have gradually increased since admission.   Electronically Signed   By: Tiburcio PeaJonathan  Watts M.D.   On: 04/08/2013 05:08   Dg Chest Port 1 View  04/07/2013   CLINICAL DATA:  Repositioning of endotracheal tube.  EXAM: PORTABLE CHEST - 1 VIEW 04/07/2013 1029 hr:  COMPARISON:  DG CHEST 1V PORT dated 04/07/2013; DG CHEST 1V PORT dated 04/06/2013; DG CHEST 1V PORT dated 04/06/2013; DG CHEST 1V PORT dated 04/05/2013; DG CHEST 1V PORT dated 04/04/2013 at 0443 hr  FINDINGS: Endotracheal tube tip now in satisfactory position projecting approximately 3 cm above the carina. Right jugular central venous catheter tip obscured by the pacemaker leads, but likely projects over mid SVC. Nasogastric tube courses below the diaphragm into the stomach. Left subclavian biventricular pacer unchanged and appears intact. Heart mildly enlarged but stable. Interstitial opacities throughout both lungs with airspace opacities in the perihilar regions, left greater than right, unchanged. Bilateral pleural effusions, unchanged. Dense consolidation in the left lower lobe, unchanged. No new pulmonary parenchymal abnormalities.  IMPRESSION: 1. Endotracheal tube  tip now in satisfactory position projecting approximately 3 cm above the carina. 2. Remaining support apparatus satisfactory. 3. Stable mild cardiomegaly. Stable interstitial opacities throughout both lungs with more focal airspace consolidation in the perihilar regions, left greater than right, edema versus pneumonia versus ARDS. 4. Stable bilateral pleural effusions. 5. Stable dense left lower lobe atelectasis and/or pneumonia (atelectasis favored). 6. No new pulmonary parenchymal abnormalities.   Electronically Signed   By: Hulan Saashomas  [name] M.D.   On: 04/07/2013 10:48   Dg Chest Port 1 View  04/07/2013   CLINICAL DATA:  Intubation.  EXAM: PORTABLE CHEST - 1 VIEW  COMPARISON:  DG CHEST 1V PORT dated 04/06/2013  FINDINGS: Endotracheal tube is noted in the upper most portion of the right mainstem bronchus. NG tube noted with its tip below the left hemidiaphragm. Right IJ line noted  in good anatomic position. Heart size stable. Cardiac pacer with lead tips in the right atrium and right ventricle. Diffuse bilateral persistent airspace disease noted. Persistent atelectasis left lower lobe. No pneumothorax or acute osseous abnormality.  IMPRESSION: 1. Endotracheal tube noted in the orifice of the right mainstem bronchus, proximal repositioning suggested. Line and tube positions otherwise stable. 2. Persistent bilateral airspace disease and left lower lobe atelectasis. These results were called by telephone at the time of interpretation on 04/07/2013 at 7:14 AM to nurse Ruby, who verbally acknowledged these results.   Electronically Signed   By: Maisie Fus  Register   On: 04/07/2013 07:15    ASSESSMENT / PLAN:  PULMONARY A: Acute Respiratory Failure - concern for viral process with pneumonitis vs possible aspiration with vomiting  B infiltrates > ddx = ARDS, viral pneumonitis, bacterial PNA, cardiogenic edema, consider amiodarone or other non-infectious inflammatory process. Note she has had a similar hospitalization  2-3 yrs ago. Consider COP, acute amiodarone rxn ARDS  P:   -continue ARDS protocol, wean FiO2 and PEEP -Vent bundle -empiric CAP coverage as below -start diuresis as BP and renal fxn can tolerate, goal CVP 5-7 -f/u CXR   CARDIOVASCULAR A:  Septic and hypovolemic Shock Hx HTN Hx CHF / ICM s/p Pacemeker - compensated -- EF 45% grade 1 dysfunction AFib on amiodarone P:  -hold oral amiodarone -hold zocor -start diuresis, current CVP 14 -consider d/c stress dose hydrocort soon  RENAL A:   Acute Kidney Injury, resolved P:   -lasix 40 q12h x 4 doses -trend BMP -replace lytes as indicated    GASTROINTESTINAL A:   Nausea / Vomiting / Diarrhea, resolved P:   -stool for culture, o&p >> pending -PPI -zofran PRN  -started TF 2/5 per nutrition    HEMATOLOGIC A:   Leukocytosis DVT Prophylaxis  P:  -heparin for DVT proph -monitor cbc   INFECTIOUS A:   Gastroenteritis, Diarrhea R/O CAP vs Viral Respiratory Illness; note Pct elevated but trending down (44->27->14) RVP neg - off tamiflu  UA neg  P:   -abx & cultures as above > ceftriaxone + azithro -stool O&P pending -other cx unrevealing -will assess pleural effusions to see if she may need diagnostic thora  ENDOCRINE A:   Hypothyroidism   P:   -continue synthroid  NEUROLOGIC A:   No acute process P:   -cont low dose fent/versed gtts  -daily WUA   Husband updated at bedside 2/6 by Dr Delton Coombes  *Care during the described time interval was provided by me and/or other providers on the critical care team. I have reviewed this patient's available data, including medical history, events of note, physical examination and test results as part of my evaluation.  CC time 40 minutes  [name], MD, PhD 04/08/2013, 8:53 AM [facility] [phone] or if no answer [phone]

## 2013-04-09 ENCOUNTER — Inpatient Hospital Stay (HOSPITAL_COMMUNITY): Payer: Medicare Other

## 2013-04-09 LAB — COMPREHENSIVE METABOLIC PANEL
ALK PHOS: 43 U/L (ref 39–117)
ALT: 49 U/L — ABNORMAL HIGH (ref 0–35)
AST: 19 U/L (ref 0–37)
Albumin: 1.8 g/dL — ABNORMAL LOW (ref 3.5–5.2)
BUN: 32 mg/dL — AB (ref 6–23)
CHLORIDE: 107 meq/L (ref 96–112)
CO2: 26 mEq/L (ref 19–32)
Calcium: 7.1 mg/dL — ABNORMAL LOW (ref 8.4–10.5)
Creatinine, Ser: 1.27 mg/dL — ABNORMAL HIGH (ref 0.50–1.10)
GFR calc non Af Amer: 39 mL/min — ABNORMAL LOW (ref >90)
GFR, EST AFRICAN AMERICAN: 46 mL/min — AB (ref >90)
GLUCOSE: 144 mg/dL — AB (ref 70–99)
Potassium: 3.4 mEq/L — ABNORMAL LOW (ref 3.7–5.3)
Sodium: 142 mEq/L (ref 137–147)
Total Bilirubin: <0.2 mg/dL — ABNORMAL LOW (ref 0.3–1.2)
Total Protein: 5 g/dL — ABNORMAL LOW (ref 6.0–8.3)

## 2013-04-09 LAB — CBC
HCT: 32.7 % — ABNORMAL LOW (ref 36.0–46.0)
HEMOGLOBIN: 10.8 g/dL — AB (ref 12.0–15.0)
MCH: 30.9 pg (ref 26.0–34.0)
MCHC: 33 g/dL (ref 30.0–36.0)
MCV: 93.7 fL (ref 78.0–100.0)
PLATELETS: UNDETERMINED 10*3/uL (ref 150–400)
RBC: 3.49 MIL/uL — ABNORMAL LOW (ref 3.87–5.11)
RDW: 14.9 % (ref 11.5–15.5)
WBC: 14.8 10*3/uL — ABNORMAL HIGH (ref 4.0–10.5)

## 2013-04-09 LAB — GLUCOSE, CAPILLARY
GLUCOSE-CAPILLARY: 129 mg/dL — AB (ref 70–99)
GLUCOSE-CAPILLARY: 182 mg/dL — AB (ref 70–99)

## 2013-04-09 LAB — PHOSPHORUS: Phosphorus: 2.7 mg/dL (ref 2.3–4.6)

## 2013-04-09 LAB — MAGNESIUM: MAGNESIUM: 2.1 mg/dL (ref 1.5–2.5)

## 2013-04-09 MED ORDER — HYDROCORTISONE NA SUCCINATE PF 100 MG IJ SOLR
50.0000 mg | Freq: Two times a day (BID) | INTRAMUSCULAR | Status: DC
  Administered 2013-04-09: 50 mg via INTRAVENOUS
  Filled 2013-04-09 (×2): qty 1

## 2013-04-09 MED ORDER — HYDRALAZINE HCL 20 MG/ML IJ SOLN
10.0000 mg | INTRAMUSCULAR | Status: DC | PRN
  Administered 2013-04-09: 20 mg via INTRAVENOUS
  Administered 2013-04-11: 10 mg via INTRAVENOUS
  Administered 2013-04-15 – 2013-04-18 (×2): 20 mg via INTRAVENOUS
  Administered 2013-04-19: 40 mg via INTRAVENOUS
  Filled 2013-04-09: qty 1
  Filled 2013-04-09: qty 2
  Filled 2013-04-09 (×3): qty 1

## 2013-04-09 MED ORDER — LEVOTHYROXINE SODIUM 75 MCG PO TABS
75.0000 ug | ORAL_TABLET | Freq: Every day | ORAL | Status: DC
  Administered 2013-04-10 – 2013-04-18 (×9): 75 ug via ORAL
  Filled 2013-04-09 (×11): qty 1

## 2013-04-09 MED ORDER — DEXMEDETOMIDINE HCL IN NACL 200 MCG/50ML IV SOLN
0.4000 ug/kg/h | INTRAVENOUS | Status: DC
  Administered 2013-04-09 – 2013-04-10 (×4): 0.4 ug/kg/h via INTRAVENOUS
  Filled 2013-04-09 (×5): qty 50

## 2013-04-09 NOTE — Progress Notes (Signed)
PCCM    Name: [name] MRN: [id number] DOB: 03/28/1932    ADMISSION DATE:  04/04/2013 CONSULTATION DATE:  04/05/13  REFERRING MD :  Dr. Vanessa Barbara  PRIMARY SERVICE: TRH-->PCCM  CHIEF COMPLAINT:  Hypotension   BRIEF PATIENT DESCRIPTION: 80 y/o F admitted 2/2 with respiratory failure, hypovolemic and presumed septic shock, source unclear  SIGNIFICANT EVENTS / STUDIES:  2/3 - Admit with fever, SOB, vomiting, diarrhea (concern for food poisoning) & CXR concerning for interstitial pneumonitis 2/3 2 d echo>>>Ef 45% grade 1 diastolic dysfunction   LINES / TUBES: L IJ TLC 2/3>>> 2/4 OTT>> 2/4 a line>> 2/4 OGT>>  CULTURES: MRSA PCR 2/3>>> negative BCx2 2/3>>>ng Sputum 2/3>>> normal flora UA 2/3>>> negative nit and LE Resp Viral Panel 2/3>>>negative 2/4 tracheal aspirate>>normal flora Stool culture, o&p 2/5>>>  ANTIBIOTICS: Rocephin 2/3>>> Azithro 2/3>>> Tamiflu 2/3>>>2/4  SUBJECTIVE:  Afebrile Good UO  VITAL SIGNS: Temp:  [97.5 F (36.4 C)-99 F (37.2 C)] 97.7 F (36.5 C) (02/07 0900) Pulse Rate:  [69-71] 70 (02/07 0900) Resp:  [12-29] 15 (02/07 0900) BP: (97-123)/(46-68) 123/52 mmHg (02/07 0325) SpO2:  [88 %-96 %] 92 % (02/07 0900) Arterial Line BP: (96-140)/(46-82) 140/62 mmHg (02/07 0800) FiO2 (%):  [40 %-50 %] 40 % (02/07 0933) Weight:  [84 kg (185 lb 3 oz)] 84 kg (185 lb 3 oz) (02/07 0400)  HEMODYNAMICS: CVP:  [10 mmHg-15 mmHg] 14 mmHg  VENTILATOR SETTINGS: Vent Mode:  [-] PRVC FiO2 (%):  [40 %-50 %] 40 % Set Rate:  [22 bmp] 22 bmp Vt Set:  [310 mL] 310 mL PEEP:  [8 cmH20-10 cmH20] 8 cmH20 Plateau Pressure:  [15 cmH20-23 cmH20] 16 cmH20  INTAKE / OUTPUT: Intake/Output     02/06 0701 - 02/07 0700 02/07 0701 - 02/08 0700   I.V. (mL/kg) 781 (9.3) 50 (0.6)   NG/GT 710 90   IV Piggyback 510    Total Intake(mL/kg) 2001 (23.8) 140 (1.7)   Urine (mL/kg/hr) 3330 (1.7) 100 (0.4)   Total Output 3330 100   Net -1329 +40          PHYSICAL  EXAMINATION: General:  wdwn elderly female in NAD  Neuro:  Sedated on vent, RASS -1 HEENT:  Mm pink/dry, no jvd Cardiovascular:  s1s2 rrr, paced on monitor, R foot cooler with weaker pulses than L Lungs:  resps even non labored on vent, diminished L, B basilar insp crackles Abdomen:  Round/soft, bsx4 active Musculoskeletal:  No acute deformities  Skin:  Warm/dry, no edema  LABS:  CBC  Recent Labs Lab 04/07/13 0515 04/08/13 0430 04/09/13 0400  WBC 17.5* 15.2* 14.8*  HGB 11.6* 10.9* 10.8*  HCT 35.6* 33.2* 32.7*  PLT PLATELET CLUMPS NOTED ON SMEAR, UNABLE TO ESTIMATE PLATELET CLUMPS NOTED ON SMEAR, UNABLE TO ESTIMATE PLATELET CLUMPS NOTED ON SMEAR, UNABLE TO ESTIMATE   Coag's  Recent Labs Lab 04/06/13 0400  INR 1.24   BMET  Recent Labs Lab 04/07/13 0515 04/08/13 0430 04/09/13 0400  NA 138 139 142  K 4.3 4.1 3.4*  CL 108 108 107  CO2 20 21 26   BUN 18 19 32*  CREATININE 1.19* 1.03 1.27*  GLUCOSE 151* 127* 144*   Electrolytes  Recent Labs Lab 04/07/13 0515 04/08/13 0430 04/09/13 0400  CALCIUM 6.8* 7.4* 7.1*  MG 1.8 2.1 2.1  PHOS 2.0* 2.1* 2.7   Sepsis Markers  Recent Labs Lab 04/04/13 2220 04/05/13 0500 04/05/13 1700 04/06/13 0400 04/07/13 0515  LATICACIDVEN 5.2* 5.6* 2.2  --   --  PROCALCITON  --  44.16  --  27.65 13.00   ABG  Recent Labs Lab 04/06/13 0626 04/06/13 0900 04/06/13 1242  PHART 7.367 7.313* 7.324*  PCO2ART 30.2* 35.3 35.0  PO2ART 57.9* 68.4* 71.3*   Liver Enzymes  Recent Labs Lab 04/04/13 2220 04/06/13 0400 04/09/13 0400  AST 58* 130* 19  ALT 49* 167* 49*  ALKPHOS 69 41 43  BILITOT 0.6 <0.2* <0.2*  ALBUMIN 2.9* 2.0* 1.8*   Cardiac Enzymes  Recent Labs Lab 04/04/13 2133  04/05/13 2110 04/06/13 0400 04/06/13 1210  TROPONINI  --   < > <0.30 <0.30 <0.30  PROBNP 379.5  --   --   --   --   < > = values in this interval not displayed. Glucose  Recent Labs Lab 04/08/13 0348 04/08/13 0750 04/08/13 1231  04/08/13 1611 04/08/13 2033 04/09/13 0401  GLUCAP 121* 144* 147* 160* 148* 129*    Imaging Dg Chest Port 1 View  04/09/2013   CLINICAL DATA:  80 year old female intubated. Sepsis. Respiratory failure. Acute on chronic renal failure. Initial encounter.  EXAM: PORTABLE CHEST - 1 VIEW  COMPARISON:  04/08/2013 and earlier.  FINDINGS: Portable AP semi upright view at 0427 hrs. Endotracheal tube tip in good position. Enteric tube courses to the left upper quadrant. Stable right IJ central line. Left chest cardiac pacemaker unchanged.  Mildly decreased veiling opacity at both lung bases. Continued confluent retrocardiac opacity, but decreased patchy bilateral suprahilar/perihilar opacity. No areas of worsening ventilation.  IMPRESSION: 1.  Stable lines and tubes. 2. Mildly improved ventilation, mild radiographic regression of bilateral pneumonia. 3. Bilateral pleural effusions.   Electronically Signed   By: Augusto GambleLee  Hall M.D.   On: 04/09/2013 06:10   Dg Chest Port 1 View  04/08/2013   CLINICAL DATA:  Followup respiratory failure  EXAM: PORTABLE CHEST - 1 VIEW  COMPARISON:  Chest x-ray from yesterday  FINDINGS: Lower endotracheal tube, now 1.5 cm above the carina. An orogastric tube continues into the stomach. Right IJ catheter in stable position. No interval displacement of biventricular pacer wires. Persistent hazy appearance of the chest consistent with effusions. There is airspace disease, most confluent in the upper lungs, left more than right. No evidence of pneumothorax.  IMPRESSION: 1. Lower endotracheal tube, but still in good position. The tube tip is now 1.6 cm above the carina. 2. Diffuse lung opacity is not significantly changed, likely a combination of edema and pneumonia. 3. Layering bilateral pleural effusions which have gradually increased since admission.   Electronically Signed   By: Tiburcio PeaJonathan  Watts M.D.   On: 04/08/2013 05:08   Dg Chest Port 1 View  04/07/2013   CLINICAL DATA:  Repositioning of  endotracheal tube.  EXAM: PORTABLE CHEST - 1 VIEW 04/07/2013 1029 hr:  COMPARISON:  DG CHEST 1V PORT dated 04/07/2013; DG CHEST 1V PORT dated 04/06/2013; DG CHEST 1V PORT dated 04/06/2013; DG CHEST 1V PORT dated 04/05/2013; DG CHEST 1V PORT dated 04/04/2013 at 0443 hr  FINDINGS: Endotracheal tube tip now in satisfactory position projecting approximately 3 cm above the carina. Right jugular central venous catheter tip obscured by the pacemaker leads, but likely projects over mid SVC. Nasogastric tube courses below the diaphragm into the stomach. Left subclavian biventricular pacer unchanged and appears intact. Heart mildly enlarged but stable. Interstitial opacities throughout both lungs with airspace opacities in the perihilar regions, left greater than right, unchanged. Bilateral pleural effusions, unchanged. Dense consolidation in the left lower lobe, unchanged. No new pulmonary parenchymal abnormalities.  IMPRESSION: 1. Endotracheal tube tip now in satisfactory position projecting approximately 3 cm above the carina. 2. Remaining support apparatus satisfactory. 3. Stable mild cardiomegaly. Stable interstitial opacities throughout both lungs with more focal airspace consolidation in the perihilar regions, left greater than right, edema versus pneumonia versus ARDS. 4. Stable bilateral pleural effusions. 5. Stable dense left lower lobe atelectasis and/or pneumonia (atelectasis favored). 6. No new pulmonary parenchymal abnormalities.   Electronically Signed   By: Hulan Saas M.D.   On: 04/07/2013 10:48    ASSESSMENT / PLAN:  PULMONARY A: Acute Respiratory Failure - concern for viral process with pneumonitis vs possible aspiration with vomiting  B infiltrates > ddx = ARDS, viral pneumonitis, bacterial PNA, cardiogenic edema, consider amiodarone or other non-infectious inflammatory process. Note she has had a similar hospitalization 2-3 yrs ago. Consider COP, acute amiodarone rxn ARDS  P:   -continue ARDS  protocol, on 40/+5, start SBTs -Vent bundle -empiric CAP coverage as below -start diuresis as BP and renal fxn can tolerate, goal CVP 5-7  CARDIOVASCULAR A:  Septic and hypovolemic Shock Hx HTN Hx CHF / ICM s/p Pacemeker - compensated -- EF 45% grade 1 dysfunction AFib on amiodarone P:  -hold oral amiodarone -hold zocor -start diuresis, current CVP 14 -consider d/c stress dose hydrocort soon  RENAL A:   Acute Kidney Injury, resolved P:   -lasix 40 q12h x 4 doses, cr rising -trend BMP -replace lytes as indicated    GASTROINTESTINAL A:   Nausea / Vomiting / Diarrhea, resolved P:   -stool for culture, o&p >> pending -PPI -zofran PRN  -ct TF 2/5 per nutrition    HEMATOLOGIC A:   Leukocytosis DVT Prophylaxis  P:  -heparin for DVT proph -monitor cbc   INFECTIOUS A:   Gastroenteritis, Diarrhea R/O CAP vs Viral Respiratory Illness; note Pct elevated but trending down (44->27->14)-fvors infectious cause RVP neg - off tamiflu  UA neg  P:   - ceftriaxone + azithro -stool O&P pending   ENDOCRINE A:   Hypothyroidism   P:   -continue synthroid  NEUROLOGIC A:   No acute process P:   -cont low dose fentgtts  -add precedex, dc versed -daily WUA   Husband & son  updated at bedside 2/7  *Care during the described time interval was provided by me and/or other providers on the critical care team. I have reviewed this patient's available data, including medical history, events of note, physical examination and test results as part of my evaluation.  CC time 40 minutes  Cyril Mourning MD. FCCP. [facility] Pager [phone] If no response call [phone]   04/09/2013, 10:10 AM

## 2013-04-09 NOTE — Progress Notes (Signed)
[facility] ADULT ICU REPLACEMENT PROTOCOL FOR AM LAB REPLACEMENT ONLY  The patient does not apply for the [facility] Adult ICU Electrolyte Replacment Protocol based on the criteria listed below:   1. Is GFR >/= 40 ml/min? no  Patient's GFR today is 39 2. Is urine output >/= 0.5 ml/kg/hr for the last 6 hours? no Patient's UOP is  ml/kg/hr 3. Is BUN < 60 mg/dL? yes  Patient's BUN today is  4. Abnormal electrolyte(s): K 3.4 5. Ordered repletion with: NA 6. If a panic level lab has been reported, has the CCM MD in charge been notified? yes.   Physician:  Dr Molli Knock. He will defer to rounding MD  [facility], Nolon Yellin A 04/09/2013 5:36 AM

## 2013-04-10 ENCOUNTER — Encounter (HOSPITAL_COMMUNITY): Payer: Self-pay | Admitting: Anesthesiology

## 2013-04-10 ENCOUNTER — Inpatient Hospital Stay (HOSPITAL_COMMUNITY): Payer: Medicare Other

## 2013-04-10 DIAGNOSIS — R0902 Hypoxemia: Secondary | ICD-10-CM

## 2013-04-10 DIAGNOSIS — J9589 Other postprocedural complications and disorders of respiratory system, not elsewhere classified: Secondary | ICD-10-CM

## 2013-04-10 DIAGNOSIS — J9383 Other pneumothorax: Secondary | ICD-10-CM

## 2013-04-10 DIAGNOSIS — I469 Cardiac arrest, cause unspecified: Secondary | ICD-10-CM

## 2013-04-10 LAB — BLOOD GAS, ARTERIAL
Acid-Base Excess: 2.5 mmol/L — ABNORMAL HIGH (ref 0.0–2.0)
Acid-base deficit: 2.1 mmol/L — ABNORMAL HIGH (ref 0.0–2.0)
Bicarbonate: 22.3 mEq/L (ref 20.0–24.0)
Bicarbonate: 26.6 mEq/L — ABNORMAL HIGH (ref 20.0–24.0)
DRAWN BY: 295031
Drawn by: 295031
FIO2: 1 %
FIO2: 0.5 %
LHR: 22 {breaths}/min
MECHVT: 310 mL
O2 Saturation: 87.9 %
O2 Saturation: 92.8 %
PCO2 ART: 39.1 mmHg (ref 35.0–45.0)
PEEP: 8 cmH2O
PEEP: 5 cmH2O
Patient temperature: 98.6
Patient temperature: 98.6
RATE: 22 resp/min
TCO2: 20 mmol/L (ref 0–100)
TCO2: 23.8 mmol/L (ref 0–100)
VT: 310 mL
pCO2 arterial: 41.4 mmHg (ref 35.0–45.0)
pH, Arterial: 7.374 (ref 7.350–7.450)
pH, Arterial: 7.424 (ref 7.350–7.450)
pO2, Arterial: 58.7 mmHg — ABNORMAL LOW (ref 80.0–100.0)
pO2, Arterial: 65.6 mmHg — ABNORMAL LOW (ref 80.0–100.0)

## 2013-04-10 LAB — CBC
HCT: 34.9 % — ABNORMAL LOW (ref 36.0–46.0)
Hemoglobin: 11.7 g/dL — ABNORMAL LOW (ref 12.0–15.0)
MCH: 31 pg (ref 26.0–34.0)
MCHC: 33.5 g/dL (ref 30.0–36.0)
MCV: 92.3 fL (ref 78.0–100.0)
PLATELETS: UNDETERMINED 10*3/uL (ref 150–400)
RBC: 3.78 MIL/uL — ABNORMAL LOW (ref 3.87–5.11)
RDW: 14.7 % (ref 11.5–15.5)
WBC: 12.3 10*3/uL — ABNORMAL HIGH (ref 4.0–10.5)

## 2013-04-10 LAB — BASIC METABOLIC PANEL
BUN: 40 mg/dL — AB (ref 6–23)
CALCIUM: 7.3 mg/dL — AB (ref 8.4–10.5)
CHLORIDE: 106 meq/L (ref 96–112)
CO2: 29 mEq/L (ref 19–32)
CREATININE: 1.22 mg/dL — AB (ref 0.50–1.10)
GFR calc Af Amer: 48 mL/min — ABNORMAL LOW (ref >90)
GFR calc non Af Amer: 41 mL/min — ABNORMAL LOW (ref >90)
Glucose, Bld: 155 mg/dL — ABNORMAL HIGH (ref 70–99)
Potassium: 3.1 mEq/L — ABNORMAL LOW (ref 3.7–5.3)
Sodium: 146 mEq/L (ref 137–147)

## 2013-04-10 LAB — GLUCOSE, CAPILLARY
GLUCOSE-CAPILLARY: 139 mg/dL — AB (ref 70–99)
GLUCOSE-CAPILLARY: 145 mg/dL — AB (ref 70–99)
GLUCOSE-CAPILLARY: 124 mg/dL — AB (ref 70–99)
Glucose-Capillary: 163 mg/dL — ABNORMAL HIGH (ref 70–99)
Glucose-Capillary: 156 mg/dL — ABNORMAL HIGH (ref 70–99)
Glucose-Capillary: 141 mg/dL — ABNORMAL HIGH (ref 70–99)
Glucose-Capillary: 142 mg/dL — ABNORMAL HIGH (ref 70–99)

## 2013-04-10 MED ORDER — NOREPINEPHRINE BITARTRATE 1 MG/ML IJ SOLN
2.0000 ug/min | INTRAVENOUS | Status: DC
  Administered 2013-04-10: 10 ug/min via INTRAVENOUS
  Filled 2013-04-10: qty 4

## 2013-04-10 MED ORDER — ACETYLCYSTEINE 10% NICU INHALATION SOLUTION: 2.0000 mL | Freq: Two times a day (BID) | RESPIRATORY_TRACT | Status: DC

## 2013-04-10 MED ORDER — DEXMEDETOMIDINE HCL IN NACL 400 MCG/100ML IV SOLN
0.4000 ug/kg/h | INTRAVENOUS | Status: DC
  Administered 2013-04-10: 0.8 ug/kg/h via INTRAVENOUS
  Administered 2013-04-10: 0.4 ug/kg/h via INTRAVENOUS
  Administered 2013-04-10: 0.8 ug/kg/h via INTRAVENOUS
  Administered 2013-04-10: 0.4 ug/kg/h via INTRAVENOUS
  Administered 2013-04-11 (×2): 1.2 ug/kg/h via INTRAVENOUS
  Administered 2013-04-11: 0.5 ug/kg/h via INTRAVENOUS
  Administered 2013-04-11: 1.2 ug/kg/h via INTRAVENOUS
  Administered 2013-04-11: 1.1 ug/kg/h via INTRAVENOUS
  Administered 2013-04-11 – 2013-04-12 (×4): 1.2 ug/kg/h via INTRAVENOUS
  Administered 2013-04-12: 1 ug/kg/h via INTRAVENOUS
  Administered 2013-04-12 – 2013-04-14 (×10): 1.2 ug/kg/h via INTRAVENOUS
  Administered 2013-04-15: 1.183 ug/kg/h via INTRAVENOUS
  Administered 2013-04-15 (×2): 1.2 ug/kg/h via INTRAVENOUS
  Administered 2013-04-15: 1.183 ug/kg/h via INTRAVENOUS
  Administered 2013-04-15 – 2013-04-18 (×15): 1.2 ug/kg/h via INTRAVENOUS
  Filled 2013-04-10: qty 50
  Filled 2013-04-10 (×2): qty 100
  Filled 2013-04-10 (×2): qty 50
  Filled 2013-04-10 (×5): qty 100
  Filled 2013-04-10: qty 50
  Filled 2013-04-10 (×2): qty 100
  Filled 2013-04-10: qty 50
  Filled 2013-04-10 (×2): qty 100
  Filled 2013-04-10: qty 50
  Filled 2013-04-10 (×3): qty 100
  Filled 2013-04-10: qty 50
  Filled 2013-04-10 (×3): qty 100
  Filled 2013-04-10: qty 50
  Filled 2013-04-10 (×18): qty 100
  Filled 2013-04-10: qty 50
  Filled 2013-04-10 (×7): qty 100

## 2013-04-10 MED ORDER — INSULIN ASPART 100 UNIT/ML ~~LOC~~ SOLN
0.0000 [IU] | SUBCUTANEOUS | Status: DC
  Administered 2013-04-10 – 2013-04-11 (×6): 2 [IU] via SUBCUTANEOUS
  Administered 2013-04-11 (×3): 3 [IU] via SUBCUTANEOUS
  Administered 2013-04-11: 2 [IU] via SUBCUTANEOUS
  Administered 2013-04-11 – 2013-04-12 (×7): 3 [IU] via SUBCUTANEOUS
  Administered 2013-04-13 (×2): 2 [IU] via SUBCUTANEOUS
  Administered 2013-04-13 (×4): 3 [IU] via SUBCUTANEOUS
  Administered 2013-04-14: 5 [IU] via SUBCUTANEOUS
  Administered 2013-04-14: 8 [IU] via SUBCUTANEOUS
  Administered 2013-04-14 (×3): 3 [IU] via SUBCUTANEOUS
  Administered 2013-04-14: 2 [IU] via SUBCUTANEOUS
  Administered 2013-04-15: 3 [IU] via SUBCUTANEOUS
  Administered 2013-04-15 – 2013-04-16 (×6): 5 [IU] via SUBCUTANEOUS
  Administered 2013-04-16: 8 [IU] via SUBCUTANEOUS
  Administered 2013-04-16: 11 [IU] via SUBCUTANEOUS
  Administered 2013-04-16 – 2013-04-17 (×6): 5 [IU] via SUBCUTANEOUS
  Administered 2013-04-17 – 2013-04-18 (×4): 8 [IU] via SUBCUTANEOUS
  Administered 2013-04-18 (×2): 5 [IU] via SUBCUTANEOUS
  Administered 2013-04-18: 8 [IU] via SUBCUTANEOUS
  Administered 2013-04-18: 3 [IU] via SUBCUTANEOUS
  Administered 2013-04-18: 2 [IU] via SUBCUTANEOUS
  Administered 2013-04-19 (×6): 3 [IU] via SUBCUTANEOUS
  Administered 2013-04-20: 5 [IU] via SUBCUTANEOUS
  Administered 2013-04-20: 3 [IU] via SUBCUTANEOUS
  Administered 2013-04-20: 5 [IU] via SUBCUTANEOUS
  Administered 2013-04-20: 3 [IU] via SUBCUTANEOUS
  Administered 2013-04-20: 2 [IU] via SUBCUTANEOUS
  Administered 2013-04-21: 3 [IU] via SUBCUTANEOUS
  Administered 2013-04-21: 5 [IU] via SUBCUTANEOUS
  Administered 2013-04-21: 3 [IU] via SUBCUTANEOUS
  Administered 2013-04-21: 5 [IU] via SUBCUTANEOUS
  Administered 2013-04-21: 2 [IU] via SUBCUTANEOUS
  Administered 2013-04-21: 8 [IU] via SUBCUTANEOUS
  Administered 2013-04-22: 5 [IU] via SUBCUTANEOUS
  Administered 2013-04-22: 2 [IU] via SUBCUTANEOUS
  Administered 2013-04-22: 5 [IU] via SUBCUTANEOUS
  Administered 2013-04-22: 2 [IU] via SUBCUTANEOUS

## 2013-04-10 MED ORDER — POTASSIUM CHLORIDE 20 MEQ/15ML (10%) PO LIQD
30.0000 meq | ORAL | Status: AC
  Administered 2013-04-10 (×2): 30 meq
  Filled 2013-04-10 (×2): qty 22.5

## 2013-04-10 MED ORDER — IPRATROPIUM-ALBUTEROL 0.5-2.5 (3) MG/3ML IN SOLN
3.0000 mL | Freq: Four times a day (QID) | RESPIRATORY_TRACT | Status: DC
  Administered 2013-04-10 – 2013-04-22 (×48): 3 mL via RESPIRATORY_TRACT
  Filled 2013-04-10 (×49): qty 3

## 2013-04-10 MED ORDER — ACETYLCYSTEINE 20 % IN SOLN
2.0000 mL | Freq: Two times a day (BID) | RESPIRATORY_TRACT | Status: DC
  Administered 2013-04-10 – 2013-04-11 (×3): 2 mL via RESPIRATORY_TRACT
  Administered 2013-04-11: 20:00:00 via RESPIRATORY_TRACT
  Administered 2013-04-12: 2 mL via RESPIRATORY_TRACT
  Administered 2013-04-12: 20:00:00 via RESPIRATORY_TRACT
  Administered 2013-04-13 – 2013-04-16 (×8): 2 mL via RESPIRATORY_TRACT
  Administered 2013-04-17: 08:00:00 via RESPIRATORY_TRACT
  Administered 2013-04-17 – 2013-04-18 (×2): 2 mL via RESPIRATORY_TRACT
  Administered 2013-04-18: 08:00:00 via RESPIRATORY_TRACT
  Administered 2013-04-19: 2 mL via RESPIRATORY_TRACT
  Filled 2013-04-10 (×21): qty 4

## 2013-04-10 NOTE — Progress Notes (Signed)
[place] PCCM    Name: [name] MRN: [id number] DOB: 29-May-1932    ADMISSION DATE:  04/04/2013 CONSULTATION DATE:  04/05/13  REFERRING MD :  Dr. Vanessa Barbara  PRIMARY SERVICE: TRH-->PCCM  CHIEF COMPLAINT:  Hypotension   BRIEF PATIENT DESCRIPTION: 80 y/o F admitted 2/2 with respiratory failure, hypovolemic and presumed septic shock, source unclear. ARDS resolving by 2/7 but course complicated on 2/8  Am by mucus plug ETT obstruction followed by cardiac arrest, CPR x 9 mins, improved with re-intubation & left pneumothorax decompression  SIGNIFICANT EVENTS / STUDIES:  2/3 - Admit with fever, SOB, vomiting, diarrhea (concern for food poisoning) & CXR concerning for interstitial pneumonitis 2/3 2 d echo>>>Ef 45% grade 1 diastolic dysfunction   LINES / TUBES: L IJ TLC 2/3>>> 2/4 OTT>> 2/4 a line>> 2/4 OGT>>  CULTURES: MRSA PCR 2/3>>> negative BCx2 2/3>>>ng Sputum 2/3>>> normal flora UA 2/3>>> negative nit and LE Resp Viral Panel 2/3>>>negative 2/4 tracheal aspirate>>normal flora Stool culture, o&p 2/5>>>  ANTIBIOTICS: Rocephin 2/3>>> Azithro 2/3>>> Tamiflu 2/3>>>2/4  SUBJECTIVE:  2/8  Am  mucus plug ETT obstruction followed by cardiac arrest, CPR x 9 mins, improved with re-intubation & left pneumothorax decompression ON my arrival unresponsive & low satn post re-intubation, chest tube emergently inserted on left after reviewing CXR Hypotension- levophed started  VITAL SIGNS: Temp:  [97.5 F (36.4 C)-99 F (37.2 C)] 98.6 F (37 C) (02/08 0842) Pulse Rate:  [25-133] 81 (02/08 0842) Resp:  [11-57] 32 (02/08 0842) BP: (61-204)/(35-140) 172/140 mmHg (02/08 0842) SpO2:  [29 %-100 %] 94 % (02/08 0842) Arterial Line BP: (23-208)/(17-136) 116/57 mmHg (02/08 0815) FiO2 (%):  [40 %-100 %] 100 % (02/08 0839) Weight:  [85.2 kg (187 lb 13.3 oz)] 85.2 kg (187 lb 13.3 oz) (02/08 0600)  HEMODYNAMICS: CVP:  [13 mmHg-21 mmHg] 13 mmHg  VENTILATOR SETTINGS: Vent Mode:  [-] PRVC FiO2  (%):  [40 %-100 %] 100 % Set Rate:  [22 bmp] 22 bmp Vt Set:  [310 mL] 310 mL PEEP:  [5 cmH20-8 cmH20] 5 cmH20 Pressure Support:  [15 cmH20] 15 cmH20 Plateau Pressure:  [15 cmH20-27 cmH20] 27 cmH20  INTAKE / OUTPUT: Intake/Output     02/07 0701 - 02/08 0700 02/08 0701 - 02/09 0700   I.V. (mL/kg) 760.5 (8.9)    NG/GT 845    IV Piggyback 300    Total Intake(mL/kg) 1905.5 (22.4)    Urine (mL/kg/hr) 2400 (1.2)    Total Output 2400     Net -494.5          Stool Occurrence 1 x      PHYSICAL EXAMINATION: General:  wdwn elderly female in NAD  Neuro:  Sedated on vent, RASS -1 HEENT:  Mm pink/dry, no jvd Cardiovascular:  s1s2 rrr, paced on monitor, R foot cooler with weaker pulses than L Lungs:  resps even non labored on vent, diminished L, B basilar insp crackles, left chest tube -good air leak Abdomen:  Round/soft, bsx4 active Musculoskeletal:  No acute deformities  Skin:  Warm/dry, no edema  LABS:  CBC  Recent Labs Lab 04/08/13 0430 04/09/13 0400 04/10/13 0505  WBC 15.2* 14.8* 12.3*  HGB 10.9* 10.8* 11.7*  HCT 33.2* 32.7* 34.9*  PLT PLATELET CLUMPS NOTED ON SMEAR, UNABLE TO ESTIMATE PLATELET CLUMPS NOTED ON SMEAR, UNABLE TO ESTIMATE PLATELET CLUMPS NOTED ON SMEAR, UNABLE TO ESTIMATE   Coag's  Recent Labs Lab 04/06/13 0400  INR 1.24   BMET  Recent Labs Lab 04/08/13 0430 04/09/13 0400 04/10/13  0505  NA 139 142 146  K 4.1 3.4* 3.1*  CL 108 107 106  CO2 21 26 29   BUN 19 32* 40*  CREATININE 1.03 1.27* 1.22*  GLUCOSE 127* 144* 155*   Electrolytes  Recent Labs Lab 04/07/13 0515 04/08/13 0430 04/09/13 0400 04/10/13 0505  CALCIUM 6.8* 7.4* 7.1* 7.3*  MG 1.8 2.1 2.1  --   PHOS 2.0* 2.1* 2.7  --    Sepsis Markers  Recent Labs Lab 04/04/13 2220 04/05/13 0500 04/05/13 1700 04/06/13 0400 04/07/13 0515  LATICACIDVEN 5.2* 5.6* 2.2  --   --   PROCALCITON  --  44.16  --  27.65 13.00   ABG  Recent Labs Lab 04/06/13 0900 04/06/13 1242 04/10/13 0828   PHART 7.313* 7.324* 7.374  PCO2ART 35.3 35.0 39.1  PO2ART 68.4* 71.3* 58.7*   Liver Enzymes  Recent Labs Lab 04/04/13 2220 04/06/13 0400 04/09/13 0400  AST 58* 130* 19  ALT 49* 167* 49*  ALKPHOS 69 41 43  BILITOT 0.6 <0.2* <0.2*  ALBUMIN 2.9* 2.0* 1.8*   Cardiac Enzymes  Recent Labs Lab 04/04/13 2133  04/05/13 2110 04/06/13 0400 04/06/13 1210  TROPONINI  --   < > <0.30 <0.30 <0.30  PROBNP 379.5  --   --   --   --   < > = values in this interval not displayed. Glucose  Recent Labs Lab 04/08/13 2033 04/09/13 0003 04/09/13 0401 04/10/13 0109 04/10/13 0330 04/10/13 0526  GLUCAP 148* 182* 129* 163* 156* 139*    Imaging Dg Chest Port 1 View  04/10/2013   CLINICAL DATA:  Respiratory arrest  EXAM: PORTABLE CHEST - 1 VIEW  COMPARISON:  04/10/2013 443 hrs  FINDINGS: Cardiac shadow is stable. A pacing device is again seen. A a right-sided central venous line, endotracheal tube and nasogastric catheter are well visualized. A moderate-sized left-sided pneumothorax is noted new from the prior exam. A considerable amount subcutaneous emphysema is seen. The right lung is stable.  IMPRESSION: New left-sided pneumothorax. These findings were called to the floor at the time of exam interpretation. A chest tube is currently being placed on the left for the pneumothorax.   Electronically Signed   By: Alcide Clever M.D.   On: 04/10/2013 08:27   Dg Chest Port 1 View  04/10/2013   CLINICAL DATA:  Deep followup effusion  EXAM: PORTABLE CHEST - 1 VIEW  COMPARISON:  04/09/2012  FINDINGS: The endotracheal tube is again identified 2.8 cm above the carina. This is stable in appearance. A nasogastric catheter is noted within the stomach. A right jugular central line is again seen. A pacing device is again noted. Patchy infiltrates are again identified bilaterally but stable. Bilateral posteriorly layering effusions are seen. No new focal abnormality is noted.  IMPRESSION: No significant change from the  prior exam.   Electronically Signed   By: Alcide Clever M.D.   On: 04/10/2013 07:17   Dg Chest Port 1 View  04/09/2013   CLINICAL DATA:  80 year old female intubated. Sepsis. Respiratory failure. Acute on chronic renal failure. Initial encounter.  EXAM: PORTABLE CHEST - 1 VIEW  COMPARISON:  04/08/2013 and earlier.  FINDINGS: Portable AP semi upright view at 0427 hrs. Endotracheal tube tip in good position. Enteric tube courses to the left upper quadrant. Stable right IJ central line. Left chest cardiac pacemaker unchanged.  Mildly decreased veiling opacity at both lung bases. Continued confluent retrocardiac opacity, but decreased patchy bilateral suprahilar/perihilar opacity. No areas of worsening ventilation.  IMPRESSION:  1.  Stable lines and tubes. 2. Mildly improved ventilation, mild radiographic regression of bilateral pneumonia. 3. Bilateral pleural effusions.   Electronically Signed   By: Augusto GambleLee  Hall M.D.   On: 04/09/2013 06:10    ASSESSMENT / PLAN:  PULMONARY A: Acute Respiratory Failure - concern for viral process with pneumonitis vs possible aspiration with vomiting  B infiltrates > ddx = ARDS, viral pneumonitis, bacterial PNA, cardiogenic edema, consider amiodarone or other non-infectious inflammatory process. Note she has had a similar hospitalization 2-3 yrs ago. Consider COP, acute amiodarone rxn ARDS  Mucus plug -2/8 Left pneumothorax s/p chest tube P:   -continue ARDS protocol, keep PEEP to 5, titrate FIO2 down -Vent bundle -empiric CAP coverage as below -add mucormyst & nebs   CARDIOVASCULAR A:  Septic and hypovolemic Shock Cardiac arrest 2/8 am due to ETT obstruction Hx HTN Hx CHF / ICM s/p Pacemeker - compensated -- EF 45% grade 1 dysfunction AFib on amiodarone P:  -hold oral amiodarone -hold zocor - d/c stress dose hydrocort soon  RENAL A:   Acute Kidney Injury, resolved, cr trending back up with lasix, high risk for ATN post arrest P:   -dc lasix  -trend  BMP -replace lytes as indicated    GASTROINTESTINAL A:   Nausea / Vomiting / Diarrhea, resolved P:   -stool for culture, o&p >> pending -PPI -zofran PRN  -ct TF 2/5 per nutrition    HEMATOLOGIC A:   Leukocytosis DVT Prophylaxis  P:  -heparin for DVT proph -monitor cbc   INFECTIOUS A:   Gastroenteritis, Diarrhea R/O CAP vs Viral Respiratory Illness; note Pct elevated but trending down (44->27->14)-fvors infectious cause RVP neg - off tamiflu  UA neg  P:   - ceftriaxone + azithro -stool O&P pending   ENDOCRINE A:   Hypothyroidism   P:   -continue synthroid  NEUROLOGIC A:  At risk anoxic encephalopathy post arrest P:   -cont low dose fent gtts  -dc precedex, dc versed -Serial exams -daily WUA   Summary - ARDS, post c-arrest due to ETT obstruction 2/8 with 9 min CPR, lt pneumothorax & sub cut emphysema s/p decompression, now stabilised, guarded prognosis  Husband & son  updated at bedside 2/8 about sequence of events  The patient is critically ill with multiple organ systems failure and requires high complexity decision making for assessment and support, frequent evaluation and titration of therapies, application of advanced monitoring technologies and extensive interpretation of multiple databases. Critical Care Time devoted to patient care services described in this note is 60 minutes.     Cyril Mourningakesh Alva MD. Tonny BollmanFCCP. [facility] Pager [phone] If no response call [phone]   04/10/2013, 8:51 AM

## 2013-04-10 NOTE — Progress Notes (Addendum)
Chaplain paged to ICU after [name] went into a Code situation. He met with husband of 38 years and adult son.  Chaplain provided comfort to family members after CPR was completed successfully. Family remained hopeful and prayerful during their wait. Chaplain was present with Dr Elsworth Soho gave the news of the success of re-inflating [name]'s lung and her then stable condition.  Family is concerned about what medically has changed with [name] after having limited oxygen during the Code situation.   Prayer and comfort provided  Recommend follow up spiritual care by weekday chaplains.  Sallee Lange. Brion Hedges, [place]

## 2013-04-10 NOTE — Progress Notes (Signed)
7282 Patient's bp became elevated, 180's, and she became very agitated.  Skin tone became bluish purple. O2 sats dropped below 70% on the ventilator. Kennan, RTT, began using the ambu bag on the patient.  Unable to ventilate properly due to resistance.  Code Blue called at [phone].  Emergency room doctor came immediately.  See code sheet.  Patient extubated and then reintubated.  Patient now ventilating, O2 sats in the upper nineties.  Family were notified and husband and son are now at the bedside.

## 2013-04-10 NOTE — ED Provider Notes (Signed)
[facility]  Department of Emergency Medicine   Code Blue CONSULT NOTE  Chief Complaint: Cardiac arrest/unresponsive   Level V Caveat: Unresponsive  History of present illness: I was contacted by the hospital for a CODE BLUE cardiac arrest upstairs and presented to the patient's bedside.  Pt with hx cardiomyopathy, admitted to ICU w respiratory failure, on vent, had become increasingly difficult to ventilate this morning.  The resp therapy team, was unable to effectively bag patient, they elected to remove/change tube.  During this process pt had cardioresp arrest, w apnea, loss of pulses, and code blue called.   On entering room, pt pulseless and apneic, cpr in progress. Has pacemaker rhythm. Anesthesia team was at head of bed intubating, which they accomplished on their initial attempt without delay or difficulty. bil BS confirmed and co2 color change. Stat pcxr ordered.   Pt initially remained pulseless. Epi iv. Cpr. Bag ventilate. After cpr for several minutes, pt w return of pulses. pcxr pending.  pts abdomen appears distended, og to empty/decompress stomach.    Hr 84, bp 136/74, on vent. ICU MD Dr Vassie Loll present to take over care, check cxr once done, and continue management of patient.     ROS: Unable to obtain, Level V caveat  Scheduled Meds: . antiseptic oral rinse  15 mL Mouth Rinse QID  . aspirin  325 mg Per Tube Daily  . azithromycin  500 mg Intravenous Q24H  . cefTRIAXone (ROCEPHIN)  IV  1 g Intravenous Q24H  . chlorhexidine  15 mL Mouth Rinse BID  . feeding supplement (PRO-STAT SUGAR FREE 64)  30 mL Per Tube TID  . heparin  5,000 Units Subcutaneous Q8H  . hydrocortisone sod succinate (SOLU-CORTEF) inj  50 mg Intravenous Q12H  . insulin aspart  0-15 Units Subcutaneous Q4H  . levothyroxine  75 mcg Oral QAC breakfast  . LORazepam  1 mg Intravenous Once  . multivitamin  5 mL Per Tube Daily  . pantoprazole sodium  40 mg Per Tube Q1200  . potassium chloride   30 mEq Per Tube Q4H  . sodium chloride  500 mL Intravenous Once   Continuous Infusions: . sodium chloride 20 mL/hr (04/09/13 1744)  . dexmedetomidine 0.4 mcg/kg/hr (04/10/13 0600)  . feeding supplement (OXEPA) 1,000 mL (04/09/13 2100)  . fentaNYL infusion INTRAVENOUS 75 mcg/hr (04/10/13 0600)   PRN Meds:.fentaNYL, hydrALAZINE Past Medical History  Diagnosis Date  . Hypothyroid   . Complete heart block 2002    Initial PPM 2002 with upgrade by JA to CRT-P 11/21/08  . Nonischemic cardiomyopathy   . Persistent atrial fibrillation   . Hypertension   . Hyperlipidemia   . Hives 4/10  . Cataract 4/13    right  . Renal failure     3rd stage-seeing neurologist   Past Surgical History  Procedure Laterality Date  . Pacemaker insertion  2002, 2010    PPM 2002, upgrade to CRT-P by Dr Johney Frame (MDT 2010)  . Partial colectomy  1/04  . Abdominal hysterectomy  1987    TAH  . Bunionectomy Right 1996  . Breast biopsy Left 01/1997  . Cataract extraction  4/13    both eyes  . Cyst excision      on back of left ear   History   Social History  . Marital Status: Married    Spouse Name: N/A    Number of Children: N/A  . Years of Education: N/A   Occupational History  . Not on file.  Social History Main Topics  . Smoking status: Never Smoker   . Smokeless tobacco: Never Used  . Alcohol Use: No  . Drug Use: No  . Sexual Activity: No     Comment: TAH   Other Topics Concern  . Not on file   Social History Narrative   [place]   Allergies  Allergen Reactions  . Beta Adrenergic Blockers Other (See Comments)    Not working well for pt.  Sherrie Mustache. Bystolic [Nebivolol Hcl] Shortness Of Breath  . Carvedilol Shortness Of Breath    Chest  Discomfort.  . Dapsone Other (See Comments)    Kidney  failure  . Diclofenac Sodium Other (See Comments)    Causes  Fluid  Retention.  Consuelo Pandy. Livalo [Pitavastatin Calcium] Shortness Of Breath  . Methotrexate Shortness Of Breath    Mouth sores.  .  Metoprolol Succinate Hives  . Oxaprozin Diarrhea  . Other     Fragrance mixtures -  Cinol botanicals  &  Plants oil. Cocamidoproy   -   Betaine. Lipstick & other makeup & cleaning materials.   Marland Kitchen. Epinephrine     Last set of Vital Signs (not current) Filed Vitals:   04/10/13 0600  BP:   Pulse: 70  Temp: 99 F (37.2 C)  Resp: 21      Physical Exam  Gen: unresponsive Cardiovascular: pulseless  Resp: apneic. Breath sounds equal bilaterally with bagging  Abd: distended  Neuro: GCS 3, unresponsive to pain  HEENT: ETT Neck: No crepitus, trachea midline.  Musculoskeletal: No deformity  Skin: warm     Cardiopulmonary Resuscitation (CPR) Procedure Note  Directed/Performed by: Suzi RootsSTEINL,Dyami Umbach E I personally directed ancillary staff and/or performed CPR in an effort to regain return of spontaneous circulation and to maintain cardiac, neuro and systemic perfusion.       Suzi RootsKevin E Juvon Teater, MD 04/10/13 [phone]

## 2013-04-10 NOTE — Progress Notes (Signed)
[facility] ADULT ICU REPLACEMENT PROTOCOL FOR AM LAB REPLACEMENT ONLY  The patient does apply for the [facility] Adult ICU Electrolyte Replacment Protocol based on the criteria listed below:   1. Is GFR >/= 40 ml/min? yes  Patient's GFR today is 41 2. Is urine output >/= 0.5 ml/kg/hr for the last 6 hours? yes Patient's UOP is 1.24 ml/kg/hr 3. Is BUN < 60 mg/dL? yes  Patient's BUN today is 40 4. Abnormal electrolyte(s)Potassium 5. Ordered repletion with: Potassium per Protocol  Jniya Madara P 04/10/2013 5:51 AM

## 2013-04-10 NOTE — Progress Notes (Signed)
Re-assessed pt RPt CXR shows expansion of left lung with chest tube, air leak has resolved, chest tube does seem to abut mediastinum. Some sub cut emphysema noted. Satn have improved, down to 70%, dropped PEEP to 5 to avoid worsening of sub cut emphysema. Levophed being tapered off On fent gtt for sedation - following commands- this is encouraging, can use versed 1 mg prn if needed, will use precedex once off pressors. Updated husband & son in detail, showed them blocked ETT, they are appreciative of efforts. Prognosis remains guarded but improved since this am  Additional cc time x 79m  ALVA,RAKESH V.

## 2013-04-10 NOTE — Progress Notes (Signed)
eLink Physician-Brief Progress Note Patient Name: [name] DOB: 1932-06-19 MRN: [id number]  Date of Service  04/10/2013   HPI/Events of Note  Hyperglycemia on tube feeds and steroids   eICU Interventions  Plan: Placed on q4 hour SSI coverage moderate scale   Intervention Category Intermediate Interventions: Hyperglycemia - evaluation and treatment  DETERDING,ELIZABETH 04/10/2013, 4:54 AM

## 2013-04-10 NOTE — Procedures (Signed)
Chest Tube Insertion Procedure Note  Indications:  Clinically significant Pneumothorax  Pre-operative Diagnosis: Pneumothorax  Post-operative Diagnosis: Pneumothorax  Procedure Details  Informed consent was obtained for the procedure, including sedation.  Risks of lung perforation, hemorrhage, arrhythmia, and adverse drug reaction were discussed.   After sterile skin prep, using standard technique, a 14 French tube was placed in the left lateral 6-7 rib space.  Findings: Gush of air, with good pleural variation on pleurovac  Estimated Blood Loss:  Minimal         Specimens:  None              Complications:  None; patient tolerated the procedure well. Improvement in BP & satn after the procedure         Disposition: ICU - intubated and critically ill.         Condition: stable  Attending Attestation: I performed the procedure.  ALVA,RAKESH V.  [phone]

## 2013-04-11 ENCOUNTER — Inpatient Hospital Stay (HOSPITAL_COMMUNITY): Payer: Medicare Other

## 2013-04-11 LAB — CULTURE, BLOOD (ROUTINE X 2)
CULTURE: NO GROWTH
Culture: NO GROWTH

## 2013-04-11 LAB — GLUCOSE, CAPILLARY
GLUCOSE-CAPILLARY: 195 mg/dL — AB (ref 70–99)
Glucose-Capillary: 142 mg/dL — ABNORMAL HIGH (ref 70–99)
Glucose-Capillary: 150 mg/dL — ABNORMAL HIGH (ref 70–99)
Glucose-Capillary: 151 mg/dL — ABNORMAL HIGH (ref 70–99)
Glucose-Capillary: 165 mg/dL — ABNORMAL HIGH (ref 70–99)
Glucose-Capillary: 180 mg/dL — ABNORMAL HIGH (ref 70–99)

## 2013-04-11 LAB — CBC
HEMATOCRIT: 32.8 % — AB (ref 36.0–46.0)
Hemoglobin: 10.9 g/dL — ABNORMAL LOW (ref 12.0–15.0)
MCH: 31.1 pg (ref 26.0–34.0)
MCHC: 33.2 g/dL (ref 30.0–36.0)
MCV: 93.7 fL (ref 78.0–100.0)
Platelets: UNDETERMINED 10*3/uL (ref 150–400)
RBC: 3.5 MIL/uL — ABNORMAL LOW (ref 3.87–5.11)
RDW: 15.2 % (ref 11.5–15.5)
WBC: 13.4 10*3/uL — ABNORMAL HIGH (ref 4.0–10.5)

## 2013-04-11 LAB — CULTURE, RESPIRATORY W GRAM STAIN: Culture: NO GROWTH

## 2013-04-11 LAB — BASIC METABOLIC PANEL
BUN: 39 mg/dL — AB (ref 6–23)
CO2: 28 mEq/L (ref 19–32)
CREATININE: 1.2 mg/dL — AB (ref 0.50–1.10)
Calcium: 7 mg/dL — ABNORMAL LOW (ref 8.4–10.5)
Chloride: 107 mEq/L (ref 96–112)
GFR calc Af Amer: 49 mL/min — ABNORMAL LOW (ref >90)
GFR, EST NON AFRICAN AMERICAN: 42 mL/min — AB (ref >90)
Glucose, Bld: 170 mg/dL — ABNORMAL HIGH (ref 70–99)
Potassium: 3.1 mEq/L — ABNORMAL LOW (ref 3.7–5.3)
Sodium: 144 mEq/L (ref 137–147)

## 2013-04-11 LAB — CULTURE, RESPIRATORY

## 2013-04-11 MED ORDER — MIDAZOLAM HCL 2 MG/2ML IJ SOLN
2.0000 mg | INTRAMUSCULAR | Status: DC | PRN
  Administered 2013-04-11 – 2013-04-12 (×4): 2 mg via INTRAVENOUS
  Administered 2013-04-12: 4 mg via INTRAVENOUS
  Administered 2013-04-13: 2 mg via INTRAVENOUS
  Administered 2013-04-13: 4 mg via INTRAVENOUS
  Administered 2013-04-13 – 2013-04-14 (×3): 2 mg via INTRAVENOUS
  Administered 2013-04-14: 4 mg via INTRAVENOUS
  Administered 2013-04-14: 2 mg via INTRAVENOUS
  Administered 2013-04-14: 4 mg via INTRAVENOUS
  Administered 2013-04-14 – 2013-04-15 (×4): 2 mg via INTRAVENOUS
  Administered 2013-04-15 (×3): 4 mg via INTRAVENOUS
  Administered 2013-04-15 (×2): 2 mg via INTRAVENOUS
  Administered 2013-04-15 – 2013-04-16 (×4): 4 mg via INTRAVENOUS
  Administered 2013-04-16: 2 mg via INTRAVENOUS
  Administered 2013-04-16: 4 mg via INTRAVENOUS
  Administered 2013-04-16: 2 mg via INTRAVENOUS
  Administered 2013-04-16 (×2): 4 mg via INTRAVENOUS
  Administered 2013-04-16: 2 mg via INTRAVENOUS
  Administered 2013-04-17 (×5): 4 mg via INTRAVENOUS
  Administered 2013-04-18: 2 mg via INTRAVENOUS
  Administered 2013-04-18 – 2013-04-19 (×2): 4 mg via INTRAVENOUS
  Filled 2013-04-11: qty 2
  Filled 2013-04-11 (×2): qty 4
  Filled 2013-04-11 (×2): qty 2
  Filled 2013-04-11 (×2): qty 4
  Filled 2013-04-11: qty 2
  Filled 2013-04-11: qty 4
  Filled 2013-04-11: qty 2
  Filled 2013-04-11 (×4): qty 4
  Filled 2013-04-11 (×4): qty 2
  Filled 2013-04-11 (×5): qty 4
  Filled 2013-04-11 (×2): qty 2
  Filled 2013-04-11: qty 4
  Filled 2013-04-11 (×3): qty 2
  Filled 2013-04-11 (×3): qty 4
  Filled 2013-04-11 (×2): qty 2
  Filled 2013-04-11 (×3): qty 4
  Filled 2013-04-11 (×2): qty 2
  Filled 2013-04-11: qty 4
  Filled 2013-04-11: qty 2

## 2013-04-11 MED ORDER — POTASSIUM CHLORIDE 20 MEQ/15ML (10%) PO LIQD
30.0000 meq | ORAL | Status: AC
  Administered 2013-04-11 (×2): 30 meq
  Filled 2013-04-11 (×2): qty 30

## 2013-04-11 MED ORDER — POTASSIUM CHLORIDE 20 MEQ/15ML (10%) PO LIQD
40.0000 meq | Freq: Two times a day (BID) | ORAL | Status: AC
  Administered 2013-04-11 (×2): 40 meq
  Filled 2013-04-11 (×2): qty 30

## 2013-04-11 MED ORDER — OXEPA PO LIQD
1000.0000 mL | ORAL | Status: DC
  Filled 2013-04-11: qty 1000

## 2013-04-11 MED ORDER — FUROSEMIDE 10 MG/ML IJ SOLN
40.0000 mg | Freq: Once | INTRAMUSCULAR | Status: AC
  Administered 2013-04-11: 40 mg via INTRAVENOUS
  Filled 2013-04-11: qty 4

## 2013-04-11 MED ORDER — ACETAMINOPHEN 160 MG/5ML PO SOLN
650.0000 mg | Freq: Four times a day (QID) | ORAL | Status: DC | PRN
  Administered 2013-04-12 – 2013-04-21 (×8): 650 mg
  Filled 2013-04-11 (×8): qty 20.3

## 2013-04-11 MED ORDER — OXEPA PO LIQD
1000.0000 mL | ORAL | Status: DC
  Administered 2013-04-11 – 2013-04-17 (×7): 1000 mL
  Filled 2013-04-11 (×8): qty 1000

## 2013-04-11 NOTE — Progress Notes (Signed)
[facility] ADULT ICU REPLACEMENT PROTOCOL FOR AM LAB REPLACEMENT ONLY  The patient does apply for the [facility] Adult ICU Electrolyte Replacment Protocol based on the criteria listed below:   1. Is GFR >/= 40 ml/min? yes  Patient's GFR today is 42 2. Is urine output >/= 0.5 ml/kg/hr for the last 6 hours? yes Patient's UOP is .83 ml/kg/hr 3. Is BUN < 60 mg/dL? yes  Patient's BUN today is 39 4. Abnormal electrolyte(s): Potassium 5. Ordered repletion with: Potassium per Protocol Telesha Deguzman P 04/11/2013 4:25 AM

## 2013-04-11 NOTE — Progress Notes (Signed)
eLink Physician-Brief Progress Note Patient Name: [name] DOB: [birth year] MRN: [id number]  Date of Service  04/11/2013   HPI/Events of Note   Added versed pushes to sedation regimen  eICU Interventions     Intervention Category Major Interventions: Delirium, psychosis, severe agitation - evaluation and management  Doroteo Nickolson S. 04/11/2013, 9:45 PM

## 2013-04-11 NOTE — Progress Notes (Signed)
Chaplain provided continued support with pt's husband and son.   Belva Crome MDiv

## 2013-04-11 NOTE — Progress Notes (Signed)
Utilization review completed.  

## 2013-04-11 NOTE — Progress Notes (Signed)
04/11/13 1200  Clinical Encounter Type  Visited With Patient not available;Health care provider  Visit Type Follow-up   Attempted follow-up visit, but no family present.  Consulted with RN.  [place] will follow, but please also page as further support needed, particularly if family's visits are brief.  Thank you!  [address] [place], [place] [phone]

## 2013-04-11 NOTE — Progress Notes (Signed)
[facility] PCCM    Name: [name] MRN: [id number] DOB: [birth year]    ADMISSION DATE:  04/04/2013 CONSULTATION DATE:  04/05/13  REFERRING MD :  Dr. Vanessa Barbara  PRIMARY SERVICE: TRH-->PCCM  CHIEF COMPLAINT:  Hypotension   BRIEF PATIENT DESCRIPTION: 80 y/o F admitted 2/2 with respiratory failure, hypovolemic and presumed septic shock -souuce unclear.  Course complicated by ARDS.  2/8 am suffered mucus plugging with ETT obstruction with subsequent cardiac arrest.  Required CPR x 9 mins, improved with re-intubation & left pneumothorax decompression  SIGNIFICANT EVENTS / STUDIES:  2/3 - Admit with fever, SOB, vomiting, diarrhea (concern for food poisoning) & CXR concerning for interstitial pneumonitis 2/3 - 2 d echo>>>Ef 45% grade 1 diastolic dysfunction   LINES / TUBES: L IJ TLC 2/3>>> 2/4 OTT>> 2/4 a line>> 2/4 OGT>>  CULTURES: MRSA PCR 2/3>>> negative BCx2 2/3>>>neg Sputum 2/3>>> normal flora UA 2/3>>> negative nit and LE Resp Viral Panel 2/3>>>negative 2/4 tracheal aspirate>>normal flora Stool culture, o&p 2/5>>> Stool PCR 04/11/13>>  ANTIBIOTICS: Rocephin 2/3>>> Azithro 2/3>>> Tamiflu 2/3>>>2/4  SUBJECTIVE:   2.9/15: RN reports vent tachypnea when sedation lightened.  No other acute events.    VITAL SIGNS: Temp:  [97.7 F (36.5 C)-100.6 F (38.1 C)] 99.9 F (37.7 C) (02/09 0800) Pulse Rate:  [64-88] 74 (02/09 0800) Resp:  [15-29] 26 (02/09 0800) BP: (90-143)/(26-67) 112/30 mmHg (02/09 0800) SpO2:  [88 %-98 %] 90 % (02/09 0800) Arterial Line BP: (109-182)/(48-79) 123/51 mmHg (02/09 0800) FiO2 (%):  [50 %-70 %] 50 % (02/09 0755) Weight:  [188 lb 7.9 oz (85.5 kg)] 188 lb 7.9 oz (85.5 kg) (02/09 0537)  HEMODYNAMICS: CVP:  [14 mmHg-22 mmHg] 21 mmHg  VENTILATOR SETTINGS: Vent Mode:  [-] PRVC FiO2 (%):  [50 %-70 %] 50 % Set Rate:  [22 bmp] 22 bmp Vt Set:  [310 mL] 310 mL PEEP:  [5 cmH20] 5 cmH20 Plateau Pressure:  [12 cmH20-22 cmH20] 18 cmH20  INTAKE /  OUTPUT: Intake/Output     02/08 0701 - 02/09 0700 02/09 0701 - 02/10 0700   I.V. (mL/kg) 1198.2 (14)    NG/GT 540 70   IV Piggyback 250    Total Intake(mL/kg) 1988.2 (23.3) 70 (0.8)   Urine (mL/kg/hr) 960 (0.5) 125 (0.6)   Chest Tube 475 (0.2)    Total Output 1435 125   Net +553.2 -55        Stool Occurrence 1 x      PHYSICAL EXAMINATION: General:  wdwn elderly female in NAD  Neuro:  Sedated on vent, RASS -1 HEENT:  Mm pink/dry, no jvd Cardiovascular:  s1s2 rrr, paced on monitor, R foot cooler with weaker pulses than L Lungs:  resps even non labored on vent, diminished L, B basilar insp crackles, left chest tube - without air leak Abdomen:  Round/soft, bsx4 active Musculoskeletal:  No acute deformities  Skin:  Warm/dry, no edema  LABS:  CBC  Recent Labs Lab 04/09/13 0400 04/10/13 0505 04/11/13 0305  WBC 14.8* 12.3* 13.4*  HGB 10.8* 11.7* 10.9*  HCT 32.7* 34.9* 32.8*  PLT PLATELET CLUMPS NOTED ON SMEAR, UNABLE TO ESTIMATE PLATELET CLUMPS NOTED ON SMEAR, UNABLE TO ESTIMATE PLATELET CLUMPS NOTED ON SMEAR, UNABLE TO ESTIMATE   Coag's  Recent Labs Lab 04/06/13 0400  INR 1.24   BMET  Recent Labs Lab 04/09/13 0400 04/10/13 0505 04/11/13 0305  NA 142 146 144  K 3.4* 3.1* 3.1*  CL 107 106 107  CO2 26 29 28   BUN 32*  40* 39*  CREATININE 1.27* 1.22* 1.20*  GLUCOSE 144* 155* 170*   Electrolytes  Recent Labs Lab 04/07/13 0515 04/08/13 0430 04/09/13 0400 04/10/13 0505 04/11/13 0305  CALCIUM 6.8* 7.4* 7.1* 7.3* 7.0*  MG 1.8 2.1 2.1  --   --   PHOS 2.0* 2.1* 2.7  --   --    Sepsis Markers  Recent Labs Lab 04/04/13 2220 04/05/13 0500 04/05/13 1700 04/06/13 0400 04/07/13 0515  LATICACIDVEN 5.2* 5.6* 2.2  --   --   PROCALCITON  --  44.16  --  27.65 13.00   ABG  Recent Labs Lab 04/06/13 1242 04/10/13 0828 04/10/13 1250  PHART 7.324* 7.374 7.424  PCO2ART 35.0 39.1 41.4  PO2ART 71.3* 58.7* 65.6*   Liver Enzymes  Recent Labs Lab  04/04/13 2220 04/06/13 0400 04/09/13 0400  AST 58* 130* 19  ALT 49* 167* 49*  ALKPHOS 69 41 43  BILITOT 0.6 <0.2* <0.2*  ALBUMIN 2.9* 2.0* 1.8*   Cardiac Enzymes  Recent Labs Lab 04/04/13 2133  04/05/13 2110 04/06/13 0400 04/06/13 1210  TROPONINI  --   < > <0.30 <0.30 <0.30  PROBNP 379.5  --   --   --   --   < > = values in this interval not displayed. Glucose  Recent Labs Lab 04/10/13 1211 04/10/13 1621 04/10/13 1935 04/10/13 2354 04/11/13 0353 04/11/13 0756  GLUCAP 142* 145* 124* 142* 165* 151*    Imaging Dg Chest Port 1 View  04/11/2013   CLINICAL DATA:  Pneumonia, endotracheal tube position.  EXAM: PORTABLE CHEST - 1 VIEW  COMPARISON:  Chest radiograph April 10, 2013.  FINDINGS: Endotracheal tube tip projects 2.8 cm above the carina. Nasogastric tube past the proximal stomach, distal tip not imaged. Left midlung zone pigtail drainage catheter with retaining with projecting midline, slightly decreased wedge-like airspace opacity. Slightly decreased consolidation in right lung base. Right internal jugular central venous catheter with distal tip projecting in or about the distal superior vena cava. Left cardiac defibrillator in situ.  Cardiac silhouette appears mildly enlarged, mediastinal silhouette is nonsuspicious. No lucency projecting over the heart on today's examination. Slightly decreased left hemithorax subcutaneous emphysema. No pneumothorax.  IMPRESSION: No apparent change in position of life support lines.  Slightly decreased bilateral consolidations may reflect resolving pneumonia.   Electronically Signed   By: Awilda Metro   On: 04/11/2013 06:31   Dg Chest Port 1 View  04/10/2013   CLINICAL DATA:  Followup of left-sided pneumothorax. NG tube placement.  EXAM: PORTABLE CHEST - 1 VIEW  COMPARISON:  DG CHEST 1V PORT dated 04/10/2013; DG CHEST 1V PORT dated 04/10/2013; DG CHEST 1V PORT dated 04/04/2013; DG CHEST 1V PORT dated 04/08/2013  FINDINGS: 1503 hr. Endotracheal  tube is unchanged. Nasogastric tube extends beyond the inferior aspect of the film. Right internal jugular line is poorly visualized centrally. Pacer/AICD device.  A left-sided chest tube is unchanged in position. There is decreased subcutaneous emphysema about the left chest. No definite residual pneumothorax. Lucency is identified at the inferior midline of the chest, likely related to pneumopericardium. This is decreased since the prior exam.  Patchy lower lobe predominant airspace disease is greater on the right and improved.  IMPRESSION: Left-sided chest tube remaining in place without evidence of pneumothorax.  Lucency projecting at the inferior aspect of the chest is suspicious for small volume pneumopericardium. This is decreased since the prior exam. These results were called by telephone at the time of interpretation on 04/10/2013 at 3:20 PM  to Marylene Land, r.n., who verbally acknowledged these results.  Improved bilateral aeration.   Electronically Signed   By: Jeronimo Greaves M.D.   On: 04/10/2013 15:22   Dg Chest Port 1 View  04/10/2013   CLINICAL DATA:  Status post chest tube placement  EXAM: PORTABLE CHEST - 1 VIEW  COMPARISON:  04/10/2013 809 hrs  FINDINGS: Cardiac shadow is stable. A pacing device, endotracheal tube, nasogastric catheter and right central venous line are stable. A new left chest tube is noted. There is been re-expansion of the left lung with no residual pneumothorax identified. Mild vascular congestion is noted.  IMPRESSION: Re-expansion of the patient's lung on the left. No residual pneumothorax is seen.   Electronically Signed   By: Alcide Clever M.D.   On: 04/10/2013 09:26   Dg Chest Port 1 View  04/10/2013   CLINICAL DATA:  Respiratory arrest  EXAM: PORTABLE CHEST - 1 VIEW  COMPARISON:  04/10/2013 443 hrs  FINDINGS: Cardiac shadow is stable. A pacing device is again seen. A a right-sided central venous line, endotracheal tube and nasogastric catheter are well visualized. A  moderate-sized left-sided pneumothorax is noted new from the prior exam. A considerable amount subcutaneous emphysema is seen. The right lung is stable.  IMPRESSION: New left-sided pneumothorax. These findings were called to the floor at the time of exam interpretation. A chest tube is currently being placed on the left for the pneumothorax.   Electronically Signed   By: Alcide Clever M.D.   On: 04/10/2013 08:27   Dg Chest Port 1 View  04/10/2013   CLINICAL DATA:  Deep followup effusion  EXAM: PORTABLE CHEST - 1 VIEW  COMPARISON:  04/09/2012  FINDINGS: The endotracheal tube is again identified 2.8 cm above the carina. This is stable in appearance. A nasogastric catheter is noted within the stomach. A right jugular central line is again seen. A pacing device is again noted. Patchy infiltrates are again identified bilaterally but stable. Bilateral posteriorly layering effusions are seen. No new focal abnormality is noted.  IMPRESSION: No significant change from the prior exam.   Electronically Signed   By: Alcide Clever M.D.   On: 04/10/2013 07:17    ASSESSMENT / PLAN:  PULMONARY A: Acute Respiratory Failure - concern for viral process with pneumonitis vs possible aspiration with vomiting  B infiltrates > ddx = ARDS, viral pneumonitis, bacterial PNA, cardiogenic edema, consider amiodarone or other non-infectious inflammatory process. Note she has had a similar hospitalization 2-3 yrs ago. Consider COP, acute amiodarone rxn ARDS  Mucus plug -2/8 Left pneumothorax s/p chest tube - post CPR P:   -continue ARDS protocol, wean PEEP/FiO2 as tolerated -Vent bundle -empiric CAP coverage as below -mucormyst & nebs -CT to 20 cm sxn  CARDIOVASCULAR A:  Septic and hypovolemic Shock Cardiac arrest 2/8 am due to ETT obstruction Hx HTN Hx CHF / ICM s/p Pacemeker - compensated.  EF 45% grade 1 dysfunction AFib on amiodarone P:  -hold oral amiodarone -hold zocor -lasix x1   RENAL A:   Acute Kidney  Injury - at risk for worsening post arrest.   P:   -trend BMP -replace lytes as indicated  -lasix x1 2/9 with KCL x 2 doses   GASTROINTESTINAL A:   Nausea / Vomiting / Diarrhea - resolved P:   -follow pending stool cultures -PPI -zofran PRN  -TF per nutrition    HEMATOLOGIC A:   Leukocytosis DVT Prophylaxis  P:  -heparin for DVT proph -monitor cbc  INFECTIOUS A:   Gastroenteritis, Diarrhea R/O CAP vs Viral Respiratory Illness - PCT elevated but trending down (44->27->14), favors infectious cause RVP neg - off tamiflu   P:   -ceftriaxone + azithro -stool O&P pending - check stool PCR   ENDOCRINE A:   Hypothyroidism   P:   -continue synthroid  NEUROLOGIC A:   At Risk Anoxic Encephalopathy s/p CPR P:   -cont fent gtt  -precedex -Serial exams -daily WUA     Canary BrimBrandi Ollis, NP-C [facility] Pgr: [phone] or [phone] 04/11/2013, 9:22 AM   STAFF NOTE: I, Dr Lavinia SharpsM Shaquala Broeker have personally reviewed patient's available data, including medical history, events of note, physical examination and test results as part of my evaluation. I have discussed with resident/NP and other care providers such as pharmacist, RN and RRT.  In addition,  I personally evaluated patient and elicited key findings of acute resp failure s/p CPR. No SBT today tpdauy due to tachypnea on WUA. Metnal status ok.  Rest per NP/medical resident whose note is outlined above and that I agree with  The patient is critically ill with multiple organ systems failure and requires high complexity decision making for assessment and support, frequent evaluation and titration of therapies, application of advanced monitoring technologies and extensive interpretation of multiple databases.   Critical Care Time devoted to patient care services described in this note is  35  Minutes.  Dr. Kalman ShanMurali Rogenia Werntz, M.D., [facility].C.C.P Pulmonary and Critical Care Medicine Staff Physician [place]  [facility]  Pulmonary and Critical Care Pager: [phone], If no answer or between  15:00h - 7:00h: call [phone]  04/11/2013 11:17 AM

## 2013-04-11 NOTE — Progress Notes (Addendum)
NUTRITION FOLLOW UP  Intervention:   - Increase TF of Oxepa to 63ml/hr via OGT. Goal rate of TF plus Prostat 34ml TID will provide 1740 calories, 105g protein, 76ml free water and meet 109% estimated calorie needs and 100% estimated protein needs.  - Will continue to monitor   Nutrition Dx:   Inadequate oral intake related to inability to eat as evidenced by NPO - ongoing   Goal:   1. Refeeding labs WNL - not met, potassium remains low 2. TF to meet >90% of estimated nutritional needs - met   Monitor:   Weights, labs, TF tolerance/advancement, refeeding labs, vent status  Assessment:   Pt started having vomiting 04/03/13. She thought she had food poisoning and continued to vomiting throughout the day. She vomited at least a dozen times. She stopped vomiting around 10 am but then started having diarrhea, multiple episodes. As the day progressed she started feeling short of breath. She also developed chills and fever. Slight cough for the last few days. She did start to feel lightheaded. Hx of stage 3 CKD, HTN, hyperlipidemia, and hypothyroidism. Found to have acute hypoxemic respiratory failure, sepsis, pneumonia, and acute on chronic renal failure.   2/5 - Pt intubated yesterday for acute respiratory distress and placed on ARDS protocol. Pt alone in room, wearing mittens. Suspect pt with inadequate oral intake since 2/1 due to vomiting/diarrhea.   2/6 - Remains intubated. Started on TF via OGT of Oxepa at 69ml/hr. Met with RN who reports pt tolerating TF well with only 37ml residuals. Phosphorus low, potassium and magnesium WNL. Discussed with pharmacist. Pt getting sodium phosphate IV replacement today, expect phosphorus will improve with replacement. Pt's weight up 20 pounds since admission, getting Lasix today.   2/9 - Reviewed events since last RD note. Pt had code blue 2/8, CPR performed, pt with improved oxygenation. Pt extubated and reintubated 2/8. Had chest tube inserted 2/8 for  pneumothorax. Remains intubated. Phosphorus and magnesium WNL, potassium remains low - getting liquid replacement. Per conversation with RN, pt tolerating TF well with no residuals. No plans for extubation today. Pt's weight up 21 pounds since admission.   Patient is currently intubated on ventilator support.  MV: 9.2 L/min Temp (24hrs), Avg:99.3 F (37.4 C), Min:97.7 F (36.5 C), Max:100.6 F (38.1 C)  Propofol: off  TF: Oxepa at 47ml/hr and Prostat 17ml TID via OGT which provides 1380 calories, 90g protein, and 569ml free water and meet 106% of previously estimated calorie needs and 100% of previously estimated protein needs.   BUN/Cr elevated with low GFR ALT elevated   Potassium  Date/Time Value Range Status  04/11/2013  3:05 AM 3.1* 3.7 - 5.3 mEq/L Final  04/10/2013  5:05 AM 3.1* 3.7 - 5.3 mEq/L Final  04/09/2013  4:00 AM 3.4* 3.7 - 5.3 mEq/L Final     DELTA CHECK NOTED     REPEATED TO VERIFY    Phosphorus  Date/Time Value Range Status  04/09/2013  4:00 AM 2.7  2.3 - 4.6 mg/dL Final  04/08/2013  4:30 AM 2.1* 2.3 - 4.6 mg/dL Final  04/07/2013  5:15 AM 2.0* 2.3 - 4.6 mg/dL Final    Magnesium  Date/Time Value Range Status  04/09/2013  4:00 AM 2.1  1.5 - 2.5 mg/dL Final  04/08/2013  4:30 AM 2.1  1.5 - 2.5 mg/dL Final  04/07/2013  5:15 AM 1.8  1.5 - 2.5 mg/dL Final      Height: Ht Readings from Last 1 Encounters:  04/10/13 5'  3" (1.6 m)    Weight Status:   Wt Readings from Last 1 Encounters:  04/11/13 188 lb 7.9 oz (85.5 kg)  Admit wt:        167 lb 5.3 oz (75.9 kg)  Net I/Os: +6.6L  Re-estimated needs:  Kcal: 1601 Protein: 80-105g Fluid: >1.6L/day   Skin: +4 generalized edema, +2 RUE, LUE edema, +3 RLE, LLE edema   Diet Order: NPO   Intake/Output Summary (Last 24 hours) at 04/11/13 0826 Last data filed at 04/11/13 0600  Gross per 24 hour  Intake 1968.16 ml  Output   1435 ml  Net 533.16 ml    Last BM: 2/8   Labs:   Recent Labs Lab 04/07/13 0515  04/08/13 0430 04/09/13 0400 04/10/13 0505 04/11/13 0305  NA 138 139 142 146 144  K 4.3 4.1 3.4* 3.1* 3.1*  CL 108 108 107 106 107  CO2 $Re'20 21 26 29 28  'ANM$ BUN 18 19 32* 40* 39*  CREATININE 1.19* 1.03 1.27* 1.22* 1.20*  CALCIUM 6.8* 7.4* 7.1* 7.3* 7.0*  MG 1.8 2.1 2.1  --   --   PHOS 2.0* 2.1* 2.7  --   --   GLUCOSE 151* 127* 144* 155* 170*    CBG (last 3)   Recent Labs  04/10/13 1935 04/10/13 2354 04/11/13 0353  GLUCAP 124* 142* 165*    Scheduled Meds: . acetylcysteine  2 mL Nebulization BID  . antiseptic oral rinse  15 mL Mouth Rinse QID  . aspirin  325 mg Per Tube Daily  . azithromycin  500 mg Intravenous Q24H  . chlorhexidine  15 mL Mouth Rinse BID  . feeding supplement (PRO-STAT SUGAR FREE 64)  30 mL Per Tube TID  . heparin  5,000 Units Subcutaneous Q8H  . insulin aspart  0-15 Units Subcutaneous Q4H  . ipratropium-albuterol  3 mL Nebulization Q6H  . levothyroxine  75 mcg Oral QAC breakfast  . LORazepam  1 mg Intravenous Once  . multivitamin  5 mL Per Tube Daily  . pantoprazole sodium  40 mg Per Tube Q1200  . potassium chloride  30 mEq Per Tube Q4H  . sodium chloride  500 mL Intravenous Once    Continuous Infusions: . sodium chloride 1,000 mL (04/11/13 0422)  . dexmedetomidine 1.1 mcg/kg/hr (04/11/13 0804)  . feeding supplement (OXEPA) 1,000 mL (04/10/13 1305)  . fentaNYL infusion INTRAVENOUS 200 mcg/hr (04/11/13 0804)  . norepinephrine (LEVOPHED) Adult infusion Stopped (04/10/13 1000)    Mikey College MS, RD, [place] Pager [phone] After Hours Pager

## 2013-04-12 ENCOUNTER — Inpatient Hospital Stay (HOSPITAL_COMMUNITY): Payer: Medicare Other

## 2013-04-12 DIAGNOSIS — I359 Nonrheumatic aortic valve disorder, unspecified: Secondary | ICD-10-CM

## 2013-04-12 LAB — CBC WITH DIFFERENTIAL/PLATELET
Basophils Absolute: 0 10*3/uL (ref 0.0–0.1)
Basophils Relative: 0 % (ref 0–1)
EOS ABS: 0.1 10*3/uL (ref 0.0–0.7)
EOS PCT: 1 % (ref 0–5)
HEMATOCRIT: 33.7 % — AB (ref 36.0–46.0)
HEMOGLOBIN: 10.9 g/dL — AB (ref 12.0–15.0)
LYMPHS ABS: 0.9 10*3/uL (ref 0.7–4.0)
Lymphocytes Relative: 7 % — ABNORMAL LOW (ref 12–46)
MCH: 31.1 pg (ref 26.0–34.0)
MCHC: 32.3 g/dL (ref 30.0–36.0)
MCV: 96 fL (ref 78.0–100.0)
MONO ABS: 0.9 10*3/uL (ref 0.1–1.0)
Monocytes Relative: 7 % (ref 3–12)
NEUTROS ABS: 11 10*3/uL — AB (ref 1.7–7.7)
Neutrophils Relative %: 85 % — ABNORMAL HIGH (ref 43–77)
Platelets: UNDETERMINED 10*3/uL (ref 150–400)
RBC: 3.51 MIL/uL — ABNORMAL LOW (ref 3.87–5.11)
RDW: 15.4 % (ref 11.5–15.5)
WBC: 12.9 10*3/uL — ABNORMAL HIGH (ref 4.0–10.5)

## 2013-04-12 LAB — GLUCOSE, CAPILLARY
GLUCOSE-CAPILLARY: 169 mg/dL — AB (ref 70–99)
GLUCOSE-CAPILLARY: 184 mg/dL — AB (ref 70–99)
GLUCOSE-CAPILLARY: 186 mg/dL — AB (ref 70–99)
Glucose-Capillary: 167 mg/dL — ABNORMAL HIGH (ref 70–99)
Glucose-Capillary: 170 mg/dL — ABNORMAL HIGH (ref 70–99)
Glucose-Capillary: 174 mg/dL — ABNORMAL HIGH (ref 70–99)

## 2013-04-12 LAB — BLOOD GAS, ARTERIAL
Acid-Base Excess: 2.8 mmol/L — ABNORMAL HIGH (ref 0.0–2.0)
Bicarbonate: 27.2 mEq/L — ABNORMAL HIGH (ref 20.0–24.0)
DRAWN BY: 295031
FIO2: 0.5 %
O2 Saturation: 91.2 %
PEEP/CPAP: 8 cmH2O
Patient temperature: 98.6
Pressure control: 15 cmH2O
RATE: 14 resp/min
TCO2: 24.2 mmol/L (ref 0–100)
pCO2 arterial: 43.1 mmHg (ref 35.0–45.0)
pH, Arterial: 7.416 (ref 7.350–7.450)
pO2, Arterial: 63.7 mmHg — ABNORMAL LOW (ref 80.0–100.0)

## 2013-04-12 LAB — MAGNESIUM: Magnesium: 2.5 mg/dL (ref 1.5–2.5)

## 2013-04-12 LAB — BASIC METABOLIC PANEL
BUN: 33 mg/dL — AB (ref 6–23)
CALCIUM: 7.7 mg/dL — AB (ref 8.4–10.5)
CO2: 30 mEq/L (ref 19–32)
Chloride: 112 mEq/L (ref 96–112)
Creatinine, Ser: 1.05 mg/dL (ref 0.50–1.10)
GFR calc Af Amer: 57 mL/min — ABNORMAL LOW (ref >90)
GFR, EST NON AFRICAN AMERICAN: 50 mL/min — AB (ref >90)
GLUCOSE: 188 mg/dL — AB (ref 70–99)
Potassium: 5 mEq/L (ref 3.7–5.3)
Sodium: 148 mEq/L — ABNORMAL HIGH (ref 137–147)

## 2013-04-12 LAB — PHOSPHORUS: Phosphorus: 2.4 mg/dL (ref 2.3–4.6)

## 2013-04-12 MED ORDER — FUROSEMIDE 10 MG/ML IJ SOLN
40.0000 mg | Freq: Two times a day (BID) | INTRAMUSCULAR | Status: DC
  Administered 2013-04-12: 40 mg via INTRAVENOUS
  Filled 2013-04-12 (×3): qty 4

## 2013-04-12 MED ORDER — POTASSIUM CHLORIDE 20 MEQ/15ML (10%) PO LIQD
40.0000 meq | Freq: Two times a day (BID) | ORAL | Status: DC
  Administered 2013-04-12 (×2): 40 meq
  Filled 2013-04-12 (×4): qty 30

## 2013-04-12 MED ORDER — FUROSEMIDE 10 MG/ML IJ SOLN
40.0000 mg | Freq: Once | INTRAMUSCULAR | Status: AC
  Administered 2013-04-12: 40 mg via INTRAVENOUS
  Filled 2013-04-12: qty 4

## 2013-04-12 NOTE — Progress Notes (Signed)
  Echocardiogram 2D Echocardiogram has been performed.  [name] 04/12/2013, 1:03 PM

## 2013-04-12 NOTE — Progress Notes (Signed)
eLink Physician-Brief Progress Note Patient Name: PINKIE POOL DOB: 1932/10/15 MRN: [id number]  Date of Service  04/12/2013   HPI/Events of Note  Ventilator dyssynchrony   eICU Interventions  Change to PCV mode   Intervention Category Major Interventions: Respiratory failure - evaluation and management  Billy Fischer 04/12/2013, 3:36 AM

## 2013-04-12 NOTE — Progress Notes (Addendum)
[facility]    Name: [name] MRN: [id number] DOB: Jul 07, 1932   LOS 8 days   ADMISSION DATE:  04/04/2013 CONSULTATION DATE:  04/05/13  REFERRING MD :  Dr. Vanessa Barbara  PRIMARY SERVICE: TRH-->PCCM  CHIEF COMPLAINT:  Hypotension   BRIEF PATIENT DESCRIPTION: 80 y/o F admitted 2/2 with respiratory failure, hypovolemic and presumed septic shock -souuce unclear.  Course complicated by ARDS.  2/8 am suffered mucus plugging with ETT obstruction with subsequent cardiac arrest.  Required CPR x 9 mins, improved with re-intubation & left pneumothorax decompression   has a past medical history of Hypothyroid; Complete heart block (2002); Nonischemic cardiomyopathy; Persistent atrial fibrillation; Hypertension; Hyperlipidemia; Hives (4/10); Cataract (4/13); and Renal failure.   has past surgical history that includes Pacemaker insertion (2002, 2010); partial colectomy (1/04); Abdominal hysterectomy (1987); Bunionectomy (Right, 1996); Breast biopsy (Left, 01/1997); Cataract extraction (4/13); and Cyst excision.   SIGNIFICANT EVENTS / STUDIES:  2/3 - Admit with fever, SOB, vomiting, diarrhea (concern for food poisoning) & CXR concerning for interstitial pneumonitis 2/3 - 2 d echo>>>Ef 45% grade 1 diastolic dysfunction  2/8 - resp arrest with mucus plugging, ETT exchanged 2/9 - changed to PCV overnight  LINES / TUBES: L IJ TLC 2/3>>> 2/4 OTT>>>2/8, 2/8>>> 2/4 a line>> 2/4 OGT>>  CULTURES: MRSA PCR 2/3>>> negative BCx2 2/3>>>neg Sputum 2/3>>> normal flora UA 2/3>>> negative nit and LE Resp Viral Panel 2/3>>>negative 2/4 tracheal aspirate>>normal flora Stool culture, o&p 2/5>>> Stool PCR 04/11/13>>  ANTIBIOTICS: Rocephin 2/3>>>2/9 Azithro 2/3>>>2/9 Tamiflu 2/3>>>2/4  SUBJECTIVE:  Tachypnea & periods of agitation overnight.  PRN versed added.  More calm this am & reduced dose gtt (Staff note: not in shock)   VITAL SIGNS: Temp:  [98.2 F (36.8 C)-100 F (37.8 C)] 98.2 F (36.8 C)  (02/10 0800) Pulse Rate:  [70-83] 70 (02/10 0800) Resp:  [14-28] 14 (02/10 0800) BP: (113-142)/(41-50) 142/49 mmHg (02/10 0408) SpO2:  [88 %-96 %] 94 % (02/10 0812) Arterial Line BP: (114-167)/(43-60) 167/60 mmHg (02/10 0800) FiO2 (%):  [50 %-60 %] 50 % (02/10 0812) Weight:  [190 lb 11.2 oz (86.5 kg)] 190 lb 11.2 oz (86.5 kg) (02/10 0408)  HEMODYNAMICS: CVP:  [11 mmHg-15 mmHg] 12 mmHg  VENTILATOR SETTINGS: Vent Mode:  [-] PCV FiO2 (%):  [50 %-60 %] 50 % Set Rate:  [14 bmp-22 bmp] 14 bmp Vt Set:  [310 mL] 310 mL PEEP:  [5 cmH20-8 cmH20] 8 cmH20 Plateau Pressure:  [18 cmH20-24 cmH20] 24 cmH20  INTAKE / OUTPUT: Intake/Output     02/09 0701 - 02/10 0700 02/10 0701 - 02/11 0700   I.V. (mL/kg) 1547.2 (17.9)    NG/GT 1050 50   IV Piggyback     Total Intake(mL/kg) 2597.2 (30) 50 (0.6)   Urine (mL/kg/hr) 3135 (1.5) 150 (1)   Chest Tube     Total Output 3135 150   Net -537.8 -100          PHYSICAL EXAMINATION: General:  wdwn elderly female in NAD  Neuro:  Sedated on vent, RASS -1 HEENT:  Mm pink/dry, no jvd, OETT Cardiovascular:  s1s2 rrr, paced on monitor Lungs:  resps even non labored on vent, diminished L, B basilar insp crackles, left chest tube - without air leak Abdomen:  Round/soft, bsx4 active Musculoskeletal:  No acute deformities  Skin:  Warm/dry, no edema  LABS:  CBC  Recent Labs Lab 04/10/13 0505 04/11/13 0305 04/12/13 0409  WBC 12.3* 13.4* 12.9*  HGB 11.7* 10.9* 10.9*  HCT 34.9* 32.8*  33.7*  PLT PLATELET CLUMPS NOTED ON SMEAR, UNABLE TO ESTIMATE PLATELET CLUMPS NOTED ON SMEAR, UNABLE TO ESTIMATE PLATELET CLUMPS NOTED ON SMEAR, UNABLE TO ESTIMATE   Coag's  Recent Labs Lab 04/06/13 0400  INR 1.24   BMET  Recent Labs Lab 04/10/13 0505 04/11/13 0305 04/12/13 0409  NA 146 144 148*  K 3.1* 3.1* 5.0  CL 106 107 112  CO2 29 28 30   BUN 40* 39* 33*  CREATININE 1.22* 1.20* 1.05  GLUCOSE 155* 170* 188*   Electrolytes  Recent Labs Lab  04/08/13 0430 04/09/13 0400 04/10/13 0505 04/11/13 0305 04/12/13 0409  CALCIUM 7.4* 7.1* 7.3* 7.0* 7.7*  MG 2.1 2.1  --   --  2.5  PHOS 2.1* 2.7  --   --  2.4   Sepsis Markers  Recent Labs Lab 04/05/13 1700 04/06/13 0400 04/07/13 0515  LATICACIDVEN 2.2  --   --   PROCALCITON  --  27.65 13.00   ABG  Recent Labs Lab 04/06/13 1242 04/10/13 0828 04/10/13 1250  PHART 7.324* 7.374 7.424  PCO2ART 35.0 39.1 41.4  PO2ART 71.3* 58.7* 65.6*   Liver Enzymes  Recent Labs Lab 04/06/13 0400 04/09/13 0400  AST 130* 19  ALT 167* 49*  ALKPHOS 41 43  BILITOT <0.2* <0.2*  ALBUMIN 2.0* 1.8*   Cardiac Enzymes  Recent Labs Lab 04/05/13 2110 04/06/13 0400 04/06/13 1210  TROPONINI <0.30 <0.30 <0.30   Glucose  Recent Labs Lab 04/11/13 1123 04/11/13 1527 04/11/13 1919 04/12/13 0013 04/12/13 0352 04/12/13 0739  GLUCAP 180* 150* 195* 186* 169* 167*   No results found for this basename: PROBNP,  in the last 168 hours  Imaging Dg Chest Port 1 View  04/12/2013   CLINICAL DATA:  Evaluate airspace disease/ARDS.  EXAM: PORTABLE CHEST - 1 VIEW  COMPARISON:  Chest radiograph April 11, 2013  FINDINGS: Endotracheal tube tip projects 3.4 cm above the carina. Nasogastric tube side for past the gastroesophageal junction low distal tip is not imaged. Right internal jugular central venous catheter with distal tip projecting in distal superior vena cava. Left midlung zone pigtail drainage catheter with retaining looped within the chest wall. Slightly decreased left midlung zone consolidation with worsening right lower lobe and right perihilar consolidation, with increasing interstitial prominence. No definite pleural effusions. No pneumothorax.  Biventricular pacemaker in place in the left chest. Decreased left chest wall subcutaneous emphysema.  IMPRESSION: No apparent change in position of life support lines.  Improved left, worsening right lung consolidations may reflect ARDS, possibly  pneumonia with increasing interstitial prominence suggesting pulmonary edema.   Electronically Signed   By: Awilda Metroourtnay  Bloomer   On: 04/12/2013 05:22   Dg Chest Port 1 View  04/11/2013   CLINICAL DATA:  Pneumonia, endotracheal tube position.  EXAM: PORTABLE CHEST - 1 VIEW  COMPARISON:  Chest radiograph April 10, 2013.  FINDINGS: Endotracheal tube tip projects 2.8 cm above the carina. Nasogastric tube past the proximal stomach, distal tip not imaged. Left midlung zone pigtail drainage catheter with retaining with projecting midline, slightly decreased wedge-like airspace opacity. Slightly decreased consolidation in right lung base. Right internal jugular central venous catheter with distal tip projecting in or about the distal superior vena cava. Left cardiac defibrillator in situ.  Cardiac silhouette appears mildly enlarged, mediastinal silhouette is nonsuspicious. No lucency projecting over the heart on today's examination. Slightly decreased left hemithorax subcutaneous emphysema. No pneumothorax.  IMPRESSION: No apparent change in position of life support lines.  Slightly decreased bilateral consolidations may  reflect resolving pneumonia.   Electronically Signed   By: Awilda Metro   On: 04/11/2013 06:31   Dg Chest Port 1 View  04/10/2013   CLINICAL DATA:  Followup of left-sided pneumothorax. NG tube placement.  EXAM: PORTABLE CHEST - 1 VIEW  COMPARISON:  DG CHEST 1V PORT dated 04/10/2013; DG CHEST 1V PORT dated 04/10/2013; DG CHEST 1V PORT dated 04/04/2013; DG CHEST 1V PORT dated 04/08/2013  FINDINGS: 1503 hr. Endotracheal tube is unchanged. Nasogastric tube extends beyond the inferior aspect of the film. Right internal jugular line is poorly visualized centrally. Pacer/AICD device.  A left-sided chest tube is unchanged in position. There is decreased subcutaneous emphysema about the left chest. No definite residual pneumothorax. Lucency is identified at the inferior midline of the chest, likely related to  pneumopericardium. This is decreased since the prior exam.  Patchy lower lobe predominant airspace disease is greater on the right and improved.  IMPRESSION: Left-sided chest tube remaining in place without evidence of pneumothorax.  Lucency projecting at the inferior aspect of the chest is suspicious for small volume pneumopericardium. This is decreased since the prior exam. These results were called by telephone at the time of interpretation on 04/10/2013 at 3:20 PM to Marylene Land, r.n., who verbally acknowledged these results.  Improved bilateral aeration.   Electronically Signed   By: Jeronimo Greaves M.D.   On: 04/10/2013 15:22   Dg Chest Port 1 View  04/10/2013   CLINICAL DATA:  Status post chest tube placement  EXAM: PORTABLE CHEST - 1 VIEW  COMPARISON:  04/10/2013 809 hrs  FINDINGS: Cardiac shadow is stable. A pacing device, endotracheal tube, nasogastric catheter and right central venous line are stable. A new left chest tube is noted. There is been re-expansion of the left lung with no residual pneumothorax identified. Mild vascular congestion is noted.  IMPRESSION: Re-expansion of the patient's lung on the left. No residual pneumothorax is seen.   Electronically Signed   By: Alcide Clever M.D.   On: 04/10/2013 09:26    ASSESSMENT / PLAN:  PULMONARY A: Acute Respiratory Failure - concern for viral process with pneumonitis vs possible aspiration with vomiting  B infiltrates > ddx = ARDS, viral pneumonitis, bacterial, consider amiodarone or other non-infectious inflammatory process.  Note she has had a similar hospitalization 2-3 yrs ago. Consider COP, acute amiodarone rxn.  Favor viral or non-infectious etiology ARDS  Mucus plug - 2/8 with resp arrest Left pneumothorax s/p chest tube - post CPR P:   -continue PCV, assess ABG now -Vent bundle -monitor off abx -mucomyst & nebs -CT to 20 cm sxn - Aggressive lasix (staff MD()  CARDIOVASCULAR A:  Septic and Hypovolemic Shock Cardiac arrest 2/8 am  due to ETT obstruction Hx HTN Hx CHF / ICM s/p Pacemeker - compensated.  EF 45% grade 1 dysfunction AFib on amiodarone P:  -hold oral amiodarone -hold zocor -repeat lasix 2/10 - check BNP and echo (staff MD)  RENAL A:   Acute Kidney Injury - at risk for worsening post arrest.   P:   -trend BMP -replace lytes as indicated  -repeat lasix 2/10    GASTROINTESTINAL A:   Nausea / Vomiting / Diarrhea - resolved P:   -follow pending stool cultures (have not been sent / no sample) -PPI -zofran PRN  -TF per nutrition    HEMATOLOGIC A:   Leukocytosis DVT Prophylaxis  P:  -heparin for DVT proph -monitor cbc   INFECTIOUS A:   Gastroenteritis, Diarrhea - resolved. R/O  CAP vs Viral Respiratory Illness - PCT elevated but trending down (44->27->14), favors infectious cause RVP neg - off tamiflu   P:   -ceftriaxone + azithro completed 2/9 -stool O&P pending -check stool PCR   ENDOCRINE A:   Hypothyroidism   P:   -continue synthroid  NEUROLOGIC A:   At Risk Anoxic Encephalopathy s/p CPR P:   -cont fent gtt, PRN versed -precedex -Serial exams -daily WUA     Canary Brim, NP-C [facility] Pgr: [phone] or [phone] 04/12/2013, 8:40 AM   STAFF NOTE: I, Dr Lavinia Sharps have personally reviewed patient's available data, including medical history, events of note, physical examination and test results as part of my evaluation. I have discussed with resident/NP and other care providers such as pharmacist, RN and RRT.  In addition,  I personally evaluated patient and elicited key findings of LOS  8d ays, acute resp failure with ALI/ARDS and failure to wean post CPR 2 days ago. Failure to wean on acount of resp issues +/- delirium. Will get echo, start lasix, and check bnp. Need to establish goals of care given  LOS 8 days, post CPR, elderly (has severeal palliative needs trigger criteria) and poor long term prognosis if they decided to go LTAC/trach  route due to development of chronic critical illness  Rest per NP/medical resident whose note is outlined above and that I agree with  The patient is critically ill with multiple organ systems failure and requires high complexity decision making for assessment and support, frequent evaluation and titration of therapies, application of advanced monitoring technologies and extensive interpretation of multiple databases.   Critical Care Time devoted to patient care services described in this note is  35  Minutes.  Dr. Kalman Shan, M.D., [facility].C.P Pulmonary and Critical Care Medicine Staff Physician [facility] Pager: [phone], If no answer or between  15:00h - 7:00h: call [phone]  04/12/2013 11:55 AM

## 2013-04-12 NOTE — Progress Notes (Signed)
Thank you for consulting the Palliative Medicine Team at [facility] to meet your patient's and family's needs.   The reason that you asked [place] to see your patient is  For GOC We have scheduled your patient for a meeting:  2/11 1230 pm  The Surrogate decision make is: Spouse Mr. Elbert Ewings information: [phone]  Other family members that need to be present: at his discretion    Your patient is able/unable to participate:  Additional Narrative:    Dashay Giesler L. Ladona Ridgel, MD MBA The Palliative Medicine Team at [facility] Phone: [phone] Pager: [phone]

## 2013-04-13 ENCOUNTER — Inpatient Hospital Stay (HOSPITAL_COMMUNITY): Payer: Medicare Other

## 2013-04-13 ENCOUNTER — Ambulatory Visit (HOSPITAL_COMMUNITY): Payer: Medicare Other

## 2013-04-13 DIAGNOSIS — Z79899 Other long term (current) drug therapy: Secondary | ICD-10-CM

## 2013-04-13 DIAGNOSIS — J9589 Other postprocedural complications and disorders of respiratory system, not elsewhere classified: Secondary | ICD-10-CM

## 2013-04-13 DIAGNOSIS — R410 Disorientation, unspecified: Secondary | ICD-10-CM

## 2013-04-13 DIAGNOSIS — Z515 Encounter for palliative care: Secondary | ICD-10-CM

## 2013-04-13 DIAGNOSIS — N179 Acute kidney failure, unspecified: Secondary | ICD-10-CM

## 2013-04-13 LAB — URINALYSIS, ROUTINE W REFLEX MICROSCOPIC
BILIRUBIN URINE: NEGATIVE
Glucose, UA: NEGATIVE mg/dL
Ketones, ur: NEGATIVE mg/dL
NITRITE: NEGATIVE
PH: 5.5 (ref 5.0–8.0)
Protein, ur: 30 mg/dL — AB
SPECIFIC GRAVITY, URINE: 1.018 (ref 1.005–1.030)
Urobilinogen, UA: 0.2 mg/dL (ref 0.0–1.0)

## 2013-04-13 LAB — CK TOTAL AND CKMB (NOT AT ARMC)
CK TOTAL: 23 U/L (ref 7–177)
CK, MB: 0.8 ng/mL (ref 0.3–4.0)
RELATIVE INDEX: INVALID (ref 0.0–2.5)

## 2013-04-13 LAB — PHOSPHORUS: Phosphorus: 3 mg/dL (ref 2.3–4.6)

## 2013-04-13 LAB — BASIC METABOLIC PANEL
BUN: 42 mg/dL — AB (ref 6–23)
CHLORIDE: 113 meq/L — AB (ref 96–112)
CO2: 29 mEq/L (ref 19–32)
Calcium: 7.8 mg/dL — ABNORMAL LOW (ref 8.4–10.5)
Creatinine, Ser: 1.22 mg/dL — ABNORMAL HIGH (ref 0.50–1.10)
GFR calc Af Amer: 48 mL/min — ABNORMAL LOW (ref >90)
GFR calc non Af Amer: 41 mL/min — ABNORMAL LOW (ref >90)
GLUCOSE: 209 mg/dL — AB (ref 70–99)
Potassium: 5.5 mEq/L — ABNORMAL HIGH (ref 3.7–5.3)
Sodium: 149 mEq/L — ABNORMAL HIGH (ref 137–147)

## 2013-04-13 LAB — CBC
HEMATOCRIT: 36 % (ref 36.0–46.0)
HEMOGLOBIN: 11.3 g/dL — AB (ref 12.0–15.0)
MCH: 30.2 pg (ref 26.0–34.0)
MCHC: 31.4 g/dL (ref 30.0–36.0)
MCV: 96.3 fL (ref 78.0–100.0)
Platelets: UNDETERMINED 10*3/uL (ref 150–400)
RBC: 3.74 MIL/uL — ABNORMAL LOW (ref 3.87–5.11)
RDW: 15.7 % — ABNORMAL HIGH (ref 11.5–15.5)
WBC: 34.6 10*3/uL — ABNORMAL HIGH (ref 4.0–10.5)

## 2013-04-13 LAB — BLOOD GAS, ARTERIAL
Acid-Base Excess: 2.9 mmol/L — ABNORMAL HIGH (ref 0.0–2.0)
Bicarbonate: 26.2 mEq/L — ABNORMAL HIGH (ref 20.0–24.0)
DRAWN BY: 103701
FIO2: 0.6 %
O2 Saturation: 91.9 %
PCO2 ART: 39.5 mmHg (ref 35.0–45.0)
PEEP/CPAP: 10 cmH2O
PO2 ART: 67.2 mmHg — AB (ref 80.0–100.0)
Patient temperature: 101.3
Pressure control: 12 cmH2O
RATE: 14 resp/min
TCO2: 23.5 mmol/L (ref 0–100)
pH, Arterial: 7.445 (ref 7.350–7.450)

## 2013-04-13 LAB — SEDIMENTATION RATE: Sed Rate: 110 mm/hr — ABNORMAL HIGH (ref 0–22)

## 2013-04-13 LAB — AMYLASE: AMYLASE: 99 U/L (ref 0–105)

## 2013-04-13 LAB — PRO B NATRIURETIC PEPTIDE: Pro B Natriuretic peptide (BNP): 2694 pg/mL — ABNORMAL HIGH (ref 0–450)

## 2013-04-13 LAB — GLUCOSE, CAPILLARY
GLUCOSE-CAPILLARY: 155 mg/dL — AB (ref 70–99)
GLUCOSE-CAPILLARY: 164 mg/dL — AB (ref 70–99)
Glucose-Capillary: 125 mg/dL — ABNORMAL HIGH (ref 70–99)
Glucose-Capillary: 168 mg/dL — ABNORMAL HIGH (ref 70–99)
Glucose-Capillary: 135 mg/dL — ABNORMAL HIGH (ref 70–99)
Glucose-Capillary: 151 mg/dL — ABNORMAL HIGH (ref 70–99)

## 2013-04-13 LAB — URINE MICROSCOPIC-ADD ON

## 2013-04-13 LAB — RHEUMATOID FACTOR: Rhuematoid fact SerPl-aCnc: 18 IU/mL — ABNORMAL HIGH (ref <=14)

## 2013-04-13 LAB — PROCALCITONIN: Procalcitonin: 5.82 ng/mL

## 2013-04-13 LAB — LACTIC ACID, PLASMA
LACTIC ACID, VENOUS: 2 mmol/L (ref 0.5–2.2)
LACTIC ACID, VENOUS: 1.6 mmol/L (ref 0.5–2.2)

## 2013-04-13 LAB — LIPASE, BLOOD: Lipase: 110 U/L — ABNORMAL HIGH (ref 11–59)

## 2013-04-13 LAB — MAGNESIUM: Magnesium: 2.2 mg/dL (ref 1.5–2.5)

## 2013-04-13 MED ORDER — NOREPINEPHRINE BITARTRATE 1 MG/ML IJ SOLN
2.0000 ug/min | INTRAVENOUS | Status: DC
  Administered 2013-04-13: 4 ug/min via INTRAVENOUS

## 2013-04-13 MED ORDER — PIPERACILLIN-TAZOBACTAM 3.375 G IVPB
3.3750 g | Freq: Three times a day (TID) | INTRAVENOUS | Status: DC
  Administered 2013-04-13 – 2013-04-22 (×27): 3.375 g via INTRAVENOUS
  Filled 2013-04-13 (×28): qty 50

## 2013-04-13 MED ORDER — CLINDAMYCIN PHOSPHATE 600 MG/50ML IV SOLN
600.0000 mg | Freq: Three times a day (TID) | INTRAVENOUS | Status: DC
  Administered 2013-04-13 – 2013-04-17 (×11): 600 mg via INTRAVENOUS
  Filled 2013-04-13 (×13): qty 50

## 2013-04-13 MED ORDER — PIPERACILLIN-TAZOBACTAM 3.375 G IVPB
3.3750 g | Freq: Once | INTRAVENOUS | Status: AC
  Administered 2013-04-13: 3.375 g via INTRAVENOUS
  Filled 2013-04-13: qty 50

## 2013-04-13 MED ORDER — SODIUM CHLORIDE 0.9 % IV SOLN
1500.0000 mg | Freq: Once | INTRAVENOUS | Status: AC
  Administered 2013-04-13: 1500 mg via INTRAVENOUS
  Filled 2013-04-13: qty 1500

## 2013-04-13 MED ORDER — VANCOMYCIN HCL IN DEXTROSE 750-5 MG/150ML-% IV SOLN
750.0000 mg | Freq: Two times a day (BID) | INTRAVENOUS | Status: DC
  Administered 2013-04-14 – 2013-04-17 (×7): 750 mg via INTRAVENOUS
  Filled 2013-04-13 (×8): qty 150

## 2013-04-13 NOTE — Consult Note (Signed)
Palliative Medicine Team at [facility]  Date: 04/13/2013   Patient Name: [name]  DOB: 04-21-1932  MRN: [id number]  Age / Sex: 80 y.o., female   PCP: Maurice Small, MD Referring Physician: Leslye Peer, MD  HPI/Reason for Consultation: The patient is a 80 year old woman who has a past medical history significant for nonischemic cardiomyopathy with the last ejection fraction of 45-50%, hypertension on chronic amiodarone therapy for atrial fibrillation with a pacemaker implant and hypothyroidism who presented to the emergency room on the right third 2015 after having 2-3 days of nausea vomiting and diarrhea which she attributed to "food poisoning". In addition to her gastrointestinal symptoms she complained of worsening shortness of breath fatigue and generalized weakness. After arrival in emergency department her respiratory symptoms progressively worsened over the course of the day she required the use of BiPAP and had a chest x-ray that had bilateral infiltrates. She was subsequently transferred to the intensive care unit where broad-spectrum antibiotics were administered and she was treated for sepsis felt to possibly be related to pneumonia or a viral syndrome. She had a significantly elevated pro calcitonin. She had a respiratory arrest with mucous plugging and required intubation, she also had 9-10 minutes of CPR after a hypoxic arrest. She is now hospital day #9 requiring continued endotracheal tube. She is requiring high levels of sedation with fentanyl Precedex and when necessary Versed for agitation and altered mental status on wakeup assessments. She apparently had a similar admission an episode in 2012 with a prolonged hospitalization and ventilator dependent respiratory failure for which she made a full recovery. Continued medical workup is still underway with no definite source of infection, high suspicion for amiodarone toxicity, and possible occult malignancy. Palliative medicine  team was consulted for continued goals of care and family support -to begin what will likely be a difficult conversation.  Participants in Discussion: Patients son Nida Boatman, daughter Sandrea Hammond, and husband.  Goals/Summary of Discussion:  1. Code Status:  Full Code, currently intubated, in house arrest with 9-10 minutes of CPR  2. Scope of Treatment:  Family not considering anything other than "full force ahead" they are not ready to discuss "finality" or what they perceive as "giving up". Her husband tells [place] "leave no stone unturned".  Right now the unclear medical picture is complicating prognostication-this isn't straight forward in terms of outcomes since she was reportedly very "healthy and active" prior to her admission. There was no frailty or pre-existing debility from multiple chronic diseases.  We prepared them for possible decisions moving forward such as tracheostomy, PEG and deconditioning and the long road ahead if she does indeed survive.  Assessment/Plan:   Primary Diagnoses  1. VDRF-?PNA, Amiodarone Toxicity, Auto-immune, CHF 2. Gastroenteritis, resolved 3. Sepsis 4. Hypotension 5. Agitation/Delirium  Prognosis: Unfortunately with her in house arrest, current mental status/delirium vs. Anoxic brain injury, possible occult infection or other ominous cause of her acute decline-she has less than a 15% chance of survival to discharge from the hospital. We shared the very serious and life threatening condition details with her family.   PPS 10%   Active Symptoms 1. Agitation, requiring precedex, versed and fentanyl   Pursue Comfort and Aggressive Medical Treatment  Continue to determine a distinct medical cause for her acute decline-family will need this for resolution if she does not get better.  Should her condition continue to deteriorate and we arrive at the juncture for need for trach/long term critical illness package it would be reasonable to have  another family  meeting and offer comfort care as the compassionate choice for a woman who on this admission stated she would not want long term life support or life in a nursing home debilitated.  Will await CT scan results and other labs and follow the medical picture for prognostication.  Family resistant in general to palliative but accepting of discussion and discussion of possible disease trajectories.   4. Palliative Prophylaxis:   ICU protocols for pain and comfort  5. Psychosocial Spiritual Asssessment/Interventions:  Patient and Family Adjustment to Illness/Prognosis:   Spiritual Concerns or Needs:   6. Disposition: To be determined.  Social History:   reports that she has never smoked. She has never used smokeless tobacco. She reports that she does not drink alcohol or use illicit drugs. Living Situation: Lived at home with her husband, active, independent Occupation: Retired Engineer, civil (consulting)urse Described as being "very strong willed".  Family History: Family History  Problem Relation Age of Onset  . Diabetes Maternal Aunt   . Breast cancer Maternal Aunt   . Stroke Mother   . Stroke Father   . Hypothyroidism Sister   . Hypothyroidism Son   . Hypothyroidism Mother   . Hypertension Father   . Kidney disease Father     renal problems    Active Medications:  Outpatient medications: Prescriptions prior to admission  Medication Sig Dispense Refill  . amiodarone (PACERONE) 200 MG tablet Take 200 mg by mouth daily.      Marland Kitchen. aspirin 325 MG tablet Take 325 mg by mouth daily.        . Biotin 5 MG CAPS Take 5 mg by mouth daily.        . Calcium Carbonate (CALTRATE 600 PO) Take 1 tablet by mouth daily.        . Cholecalciferol (VITAMIN D3 PO) Take 1,000 Units by mouth daily.        Marland Kitchen. estrogens, conjugated, (PREMARIN) 0.3 MG tablet Take 1 tablet (0.3 mg total) by mouth daily.  30 tablet  12  . fluticasone (FLONASE) 50 MCG/ACT nasal spray Place 2 sprays into both nostrils as needed for allergies or  rhinitis.      . folic acid (FOLVITE) 1 MG tablet Take 1 mg by mouth daily.        . furosemide (LASIX) 40 MG tablet Take 40 mg by mouth daily.        Marland Kitchen. levothyroxine (SYNTHROID, LEVOTHROID) 75 MCG tablet Take 75 mcg by mouth daily after breakfast.      . lisinopril (PRINIVIL,ZESTRIL) 10 MG tablet Take 10 mg by mouth daily.      Marland Kitchen. loratadine (CLARITIN) 10 MG tablet Take 10 mg by mouth daily.        . metroNIDAZOLE (METROCREAM) 0.75 % cream Apply 1 application topically as needed.      . Misc Natural Products (OSTEO BI-FLEX ADV DOUBLE ST) CAPS Take 1 capsule by mouth daily.        . montelukast (SINGULAIR) 10 MG tablet Take 10 mg by mouth every evening.       . naproxen sodium (ANAPROX) 220 MG tablet Take 220 mg by mouth every 12 (twelve) hours.      . polyethylene glycol (MIRALAX / GLYCOLAX) packet Take 17 g by mouth every other day.        . simvastatin (ZOCOR) 20 MG tablet Take 20 mg by mouth at bedtime.        Marland Kitchen. spironolactone (ALDACTONE) 25 MG tablet Take 25 mg  by mouth as directed. Take 1 tab on  M,  W,  And   F.       . vitamin C (ASCORBIC ACID) 500 MG tablet Take 500 mg by mouth daily.          Current medications: Infusions: . sodium chloride 20 mL/hr at 04/13/13 1400  . dexmedetomidine 1.2 mcg/kg/hr (04/13/13 1400)  . fentaNYL infusion INTRAVENOUS 300 mcg/hr (04/13/13 1400)  . norepinephrine (LEVOPHED) Adult infusion 2 mcg/min (04/13/13 1421)    Scheduled Medications: . acetylcysteine  2 mL Nebulization BID  . antiseptic oral rinse  15 mL Mouth Rinse QID  . aspirin  325 mg Per Tube Daily  . chlorhexidine  15 mL Mouth Rinse BID  . feeding supplement (OXEPA)  1,000 mL Per Tube Q24H  . feeding supplement (PRO-STAT SUGAR FREE 64)  30 mL Per Tube TID  . heparin  5,000 Units Subcutaneous 3 times per day  . insulin aspart  0-15 Units Subcutaneous 6 times per day  . ipratropium-albuterol  3 mL Nebulization Q6H  . levothyroxine  75 mcg Oral QAC breakfast  . multivitamin  5 mL Per  Tube Daily  . pantoprazole sodium  40 mg Per Tube Q1200  . piperacillin-tazobactam (ZOSYN)  IV  3.375 g Intravenous Q8H  . sodium chloride  500 mL Intravenous Once  . [START ON 04/14/2013] vancomycin  750 mg Intravenous Q12H    PRN Medications: acetaminophen (TYLENOL) oral liquid 160 mg/5 mL, fentaNYL, hydrALAZINE, midazolam   Vital Signs: BP 102/30  Pulse 72  Temp(Src) 101.1 F (38.4 C) (Core (Comment))  Resp 16  Ht 5\' 3"  (1.6 m)  Wt 88.3 kg (194 lb 10.7 oz)  BMI 34.49 kg/m2  SpO2 90%  LMP 03/03/1985   Physical Exam:  Critically ill, ETT, agitated. Not following commands, not tracking. Course rhonchi, Pale.  Labs:  Basic or Comprehensive Metabolic Panel:    Component Value Date/Time   NA 149* 04/13/2013 0447   K 5.5* 04/13/2013 0447   CL 113* 04/13/2013 0447   CO2 29 04/13/2013 0447   BUN 42* 04/13/2013 0447   CREATININE 1.22* 04/13/2013 0447   GLUCOSE 209* 04/13/2013 0447   CALCIUM 7.8* 04/13/2013 0447   AST 19 04/09/2013 0400   ALT 49* 04/09/2013 0400   ALKPHOS 43 04/09/2013 0400   BILITOT <0.2* 04/09/2013 0400   PROT 5.0* 04/09/2013 0400   ALBUMIN 1.8* 04/09/2013 0400     CBC:    Component Value Date/Time   WBC 34.6* 04/13/2013 0447   WBC 7.9 02/11/2011 1144   HGB 11.3* 04/13/2013 0447   HGB 12.7 02/11/2011 1144   HCT 36.0 04/13/2013 0447   HCT 39.4 02/11/2011 1144   PLT PLATELET CLUMPS NOTED ON SMEAR, UNABLE TO ESTIMATE 04/13/2013 0447   PLT 308 Platelet count by citrate method 02/11/2011 1144   MCV 96.3 04/13/2013 0447   MCV 82.1 02/11/2011 1144   NEUTROABS 11.0* 04/12/2013 0409   NEUTROABS 5.0 02/11/2011 1144   LYMPHSABS 0.9 04/12/2013 0409   LYMPHSABS 2.0 02/11/2011 1144   MONOABS 0.9 04/12/2013 0409   MONOABS 0.7 02/11/2011 1144   EOSABS 0.1 04/12/2013 0409   EOSABS 0.2 02/11/2011 1144   BASOSABS 0.0 04/12/2013 0409   BASOSABS 0.0 02/11/2011 1144     BNP (last 3 results)  Recent Labs  04/04/13 2133 04/13/13 0447  PROBNP 379.5 2694.0*    CBG (last 3)   Recent  Labs  04/13/13 0348 04/13/13 0757 04/13/13 1131  GLUCAP 155* 168* 164*  Imaging:  Dg Chest Port 1 View  04/13/2013   CLINICAL DATA:  Endotracheal tube position.  EXAM: PORTABLE CHEST - 1 VIEW  COMPARISON:  04/12/2013  FINDINGS: Endotracheal tube has tip 4.4 cm above the carina. Nasogastric tube unchanged. Right IJ central venous catheter unchanged. Left-sided chest tube unchanged. Left-sided pacemaker unchanged. Lungs are adequately inflated and demonstrate hazy bilateral perihilar opacification with slightly improved aeration. Mild left basilar opacification unchanged. No evidence of pneumothorax. Possible small amount of left pleural fluid. Cardiomediastinal silhouette and remainder of the exam is unchanged.  IMPRESSION: Bilateral perihilar opacification slightly improved likely interstitial edema. Mild stable left basilar opacification. Cannot exclude infection. Possible small amount of left pleural fluid.  Tubes and lines as described.   Electronically Signed   By: Elberta Fortis M.D.   On: 04/13/2013 07:09   Dg Chest Port 1 View  04/12/2013   CLINICAL DATA:  Evaluate airspace disease/ARDS.  EXAM: PORTABLE CHEST - 1 VIEW  COMPARISON:  Chest radiograph April 11, 2013  FINDINGS: Endotracheal tube tip projects 3.4 cm above the carina. Nasogastric tube side for past the gastroesophageal junction low distal tip is not imaged. Right internal jugular central venous catheter with distal tip projecting in distal superior vena cava. Left midlung zone pigtail drainage catheter with retaining looped within the chest wall. Slightly decreased left midlung zone consolidation with worsening right lower lobe and right perihilar consolidation, with increasing interstitial prominence. No definite pleural effusions. No pneumothorax.  Biventricular pacemaker in place in the left chest. Decreased left chest wall subcutaneous emphysema.  IMPRESSION: No apparent change in position of life support lines.  Improved left,  worsening right lung consolidations may reflect ARDS, possibly pneumonia with increasing interstitial prominence suggesting pulmonary edema.   Electronically Signed   By: Awilda Metro   On: 04/12/2013 05:22    Other Data:  (2D echo, EKG...)  Time: 70 minutes Greater than 50%  of this time was spent counseling and coordinating care related to the above assessment and plan.  Signed by: Edsel Petrin, DO  04/13/2013, 4:26 PM  Please contact Palliative Medicine Team phone at [phone] for questions and concerns.

## 2013-04-13 NOTE — Progress Notes (Addendum)
Goals of Care Meeting with Family  Extensive discussion with husband, son & daughter.  Family updated on current clinical status, progressive nature of critical illness and medical therapies.  Discussed patient wishes regarding advanced life support.  Family accepting of information & want to continue current aggressive care.     Definitive source of illness continues to be difficult to define.  Patient initially presented with a septic shock picture (fever, n/v/d, hypotension, elevated PCT & WBC).  She responded to EGDT but had progressive respiratory failure with bilateral infiltrates of unclear etiology (working thoughts:  ALI from sepsis vs pneumonitis with amiodarone / hx of thyroid disease).  ABX completed 2/9 then patient had recurrence of fever overnight 2/10.  Initial cultures & viral screen negative.    Plan: -ongoing auto-immune work up  -pending CT Head, Chest, ABD/Pelvis -add TSH to previous labs -full code -will continue ongoing discussions with family & update on clinical information    Time:  1 hour  Canary Brim, NP-C [facility] Pgr: [phone] or [phone]

## 2013-04-13 NOTE — Progress Notes (Signed)
Levophed started at 01:47 at 4 mcq/min, during system downtime

## 2013-04-13 NOTE — Progress Notes (Signed)
PCCM Acute Assessment Note  Subjective: Pt had CT chest/abd earlier today >> found to have diffuse b/l ASD, Rt pleural effusion, and extensive subcutaneous air over abdomen.  She has been febrile and remains on vent and low dose pressors.  Also noted to have increase in WBC.  Objective: Temp:  [100.4 F (38 C)-101.8 F (38.8 C)] 101.7 F (38.7 C) (02/11 1800) Pulse Rate:  [70-95] 70 (02/11 1800) Resp:  [14-39] 16 (02/11 1800) BP: (91-144)/(30-72) 102/30 mmHg (02/11 0745) SpO2:  [87 %-99 %] 94 % (02/11 1800) Arterial Line BP: (64-167)/(34-109) 135/50 mmHg (02/11 1800) FiO2 (%):  [45 %-100 %] 60 % (02/11 1600) Weight:  [194 lb 10.7 oz (88.3 kg)] 194 lb 10.7 oz (88.3 kg) (02/11 0356)  Vent Mode:  [-] PCV FiO2 (%):  [45 %-100 %] 60 % Set Rate:  [14 bmp] 14 bmp PEEP:  [0 cmH20-10 cmH20] 10 cmH20 Plateau Pressure:  [24 cmH20-26 cmH20] 26 cmH20  CVP:  [10 mmHg-27 mmHg] 14 mmHg  General: no acute distress Neuro: Sedated HEENT: ETT in place Cardiac: regular Chest: b/l rhonchi, no wheeze, Lt chest tube in place Abd: soft, mild crepitus, decreased bowel sounds, no tenderness/rebound/guarding Ext: 1+ edema Skin: no rashes, areas of bruising over abdomen at heparin injection sites  CBC Recent Labs     04/11/13  0305  04/12/13  0409  04/13/13  0447  WBC  13.4*  12.9*  34.6*  HGB  10.9*  10.9*  11.3*  HCT  32.8*  33.7*  36.0  PLT  PLATELET CLUMPS NOTED ON SMEAR, UNABLE TO ESTIMATE  PLATELET CLUMPS NOTED ON SMEAR, UNABLE TO ESTIMATE  PLATELET CLUMPS NOTED ON SMEAR, UNABLE TO ESTIMATE   BMET Recent Labs     04/11/13  0305  04/12/13  0409  04/13/13  0447  NA  144  148*  149*  K  3.1*  5.0  5.5*  CL  107  112  113*  CO2  $Re'28  30  29  'qSY$ BUN  39*  33*  42*  CREATININE  1.20*  1.05  1.22*  GLUCOSE  170*  188*  209*    Electrolytes Recent Labs     04/11/13  0305  04/12/13  0409  04/13/13  0447  CALCIUM  7.0*  7.7*  7.8*   MG   --   2.5  2.2  PHOS   --   2.4  3.0    Sepsis Markers Recent Labs     04/13/13  0448  PROCALCITON  5.82    ABG Recent Labs     04/12/13  0940  04/13/13  1455  PHART  7.416  7.445  PCO2ART  43.1  39.5  PO2ART  63.7*  67.2*   Cardiac Enzymes Recent Labs     04/13/13  0447  PROBNP  2694.0*    Glucose Recent Labs     04/12/13  1645  04/12/13  1921  04/12/13  2356  04/13/13  0348  04/13/13  0757  04/13/13  1131  GLUCAP  170*  174*  125*  155*  168*  164*    Imaging Ct Abdomen Pelvis Wo Contrast  04/13/2013   CLINICAL DATA:  ARDS.  Infiltrates.  Abdominal pain.  EXAM: CT CHEST, ABDOMEN AND PELVIS WITHOUT CONTRAST  TECHNIQUE: Multidetector CT imaging of the chest, abdomen and pelvis was performed following the standard protocol without IV contrast.  COMPARISON:  None.  FINDINGS: CT CHEST FINDINGS  The chest wall is unremarkable.  No breast masses, supraclavicular or axillary adenopathy. A left-sided pacemaker is noted. The bony thorax is intact. No destructive bone lesions or spinal canal compromise.  The heart is mildly enlarged but no pericardial effusion. The aorta is normal in caliber. Atherosclerotic calcifications are noted. No mediastinal or hilar mass or adenopathy. Scattered lymph nodes are noted. The esophagus is grossly normal. There is a NG tube coursing down into the stomach. A left-sided chest tube is noted.  Examination of the lung parenchyma demonstrates diffuse patchy airspace disease likely edema or ARDS. There is a moderate-sized right pleural effusion and bibasilar atelectasis. No pneumothorax is identified.  CT ABDOMEN AND PELVIS FINDINGS  The liver is grossly normal without contrast. No obvious lesion or biliary dilatation. The gallbladder is filled with high attenuation material which could be vicarious excretion related to a prior contrast-enhanced study or milk of calcium. No common bile duct dilatation. The pancreas is grossly normal. The spleen is  normal in size. There is a left adrenal gland lesion which is an indeterminate finding measuring 18 Hounsfield units. No findings for hydronephrosis or lobe both ureters are slightly prominent. This could be due to a distended bladder despite a Foley catheter.  The stomach, duodenum, small bowel and colon are grossly normal without oral contrast. No obvious inflammatory changes, mass lesions or obstructive findings. The uterus is surgically absent. Both ovaries are still present are grossly normal. Moderate atherosclerotic changes involving the aorta but no focal aneurysm. No mesenteric or retroperitoneal mass.  There is extensive air in the subcutaneous tissues of the anterior abdominal wall along with some air in the left rectus muscle. The right lower chest wall and upper right abdominal wall demonstrate large amount of fluid in inflammation. There is also a small amount of free intraperitoneal air. These findings could be due to a prior pneumothorax with dissection of air. Necrotizing fasciitis is also a remote possibility.  The bony structures are unremarkable. There is advanced degenerative disc disease and facet disease in the lower lumbar spine.  IMPRESSION: 1. Extensive airspace disease in the lungs likely due to ARDS. 2. Moderate-sized right pleural effusion with overlying atelectasis. 3. Left-sided chest tube in place without definite pneumothorax. 4. Extensive air in the subcutaneous fat of the anterior abdominal wall along with marked inflammation and edema. This also scattered small areas of free intraperitoneal air. It is possible is dissecting down from a large pneumothorax. Has the patient had recent procedure? Necrotizing fasciitis is possible. 5. Distended bladder despite the Foley catheter. There is also mild hydroureter. 6. High attenuation material the gallbladder possibly due to vicarious excretion or milk of calcium   Electronically Signed   By: Kalman Jewels M.D.   On: 04/13/2013 17:31    Ct Head Wo Contrast  04/13/2013   CLINICAL DATA:  Status post cardiac arrest. Altered level of consciousness. Not following commands.  EXAM: CT HEAD WITHOUT CONTRAST  TECHNIQUE: Contiguous axial images were obtained from the base of the skull through the vertex without contrast.  COMPARISON:  CT head 03/15/2010.  FINDINGS: No evidence for acute infarction, hemorrhage, mass lesion, hydrocephalus, or extra-axial fluid. Normal for age cerebral volume. No significant white matter disease. Calvarium intact. Vascular calcification of the carotid siphon regions. Large retention cyst left maxillary sinus. Dependent fluid both mastoids and left middle ear cavity, nonspecific.  There are no specific features which suggest hypoxic-ischemic insult. Gray-white junction is preserved in the supratentorial region. There is no diffuse brain swelling or obliteration of the basilar  cisterns. There is no evidence for large vessel infarct. Continued surveillance is warranted. If clinically indicated, MRI can be more sensitive in the detection of anoxic injury. Similar appearance to 2012.  IMPRESSION: Normal for age cerebral volume.  No acute intracranial findings.  Specifically, no specific features of cerebral anoxia or brain swelling are evident on this examination.   Electronically Signed   By: Davonna Belling M.D.   On: 04/13/2013 16:40   Ct Chest Wo Contrast  04/13/2013   CLINICAL DATA:  ARDS.  Infiltrates.  Abdominal pain.  EXAM: CT CHEST, ABDOMEN AND PELVIS WITHOUT CONTRAST  TECHNIQUE: Multidetector CT imaging of the chest, abdomen and pelvis was performed following the standard protocol without IV contrast.  COMPARISON:  None.  FINDINGS: CT CHEST FINDINGS  The chest wall is unremarkable. No breast masses, supraclavicular or axillary adenopathy. A left-sided pacemaker is noted. The bony thorax is intact. No destructive bone lesions or spinal canal compromise.  The heart is mildly enlarged but no pericardial effusion. The  aorta is normal in caliber. Atherosclerotic calcifications are noted. No mediastinal or hilar mass or adenopathy. Scattered lymph nodes are noted. The esophagus is grossly normal. There is a NG tube coursing down into the stomach. A left-sided chest tube is noted.  Examination of the lung parenchyma demonstrates diffuse patchy airspace disease likely edema or ARDS. There is a moderate-sized right pleural effusion and bibasilar atelectasis. No pneumothorax is identified.  CT ABDOMEN AND PELVIS FINDINGS  The liver is grossly normal without contrast. No obvious lesion or biliary dilatation. The gallbladder is filled with high attenuation material which could be vicarious excretion related to a prior contrast-enhanced study or milk of calcium. No common bile duct dilatation. The pancreas is grossly normal. The spleen is normal in size. There is a left adrenal gland lesion which is an indeterminate finding measuring 18 Hounsfield units. No findings for hydronephrosis or lobe both ureters are slightly prominent. This could be due to a distended bladder despite a Foley catheter.  The stomach, duodenum, small bowel and colon are grossly normal without oral contrast. No obvious inflammatory changes, mass lesions or obstructive findings. The uterus is surgically absent. Both ovaries are still present are grossly normal. Moderate atherosclerotic changes involving the aorta but no focal aneurysm. No mesenteric or retroperitoneal mass.  There is extensive air in the subcutaneous tissues of the anterior abdominal wall along with some air in the left rectus muscle. The right lower chest wall and upper right abdominal wall demonstrate large amount of fluid in inflammation. There is also a small amount of free intraperitoneal air. These findings could be due to a prior pneumothorax with dissection of air. Necrotizing fasciitis is also a remote possibility.  The bony structures are unremarkable. There is advanced degenerative disc  disease and facet disease in the lower lumbar spine.  IMPRESSION: 1. Extensive airspace disease in the lungs likely due to ARDS. 2. Moderate-sized right pleural effusion with overlying atelectasis. 3. Left-sided chest tube in place without definite pneumothorax. 4. Extensive air in the subcutaneous fat of the anterior abdominal wall along with marked inflammation and edema. This also scattered small areas of free intraperitoneal air. It is possible is dissecting down from a large pneumothorax. Has the patient had recent procedure? Necrotizing fasciitis is possible. 5. Distended bladder despite the Foley catheter. There is also mild hydroureter. 6. High attenuation material the gallbladder possibly due to vicarious excretion or milk of calcium   Electronically Signed   By: Loralie Champagne  M.D.   On: 04/13/2013 17:31   Dg Chest Port 1 View  04/13/2013   CLINICAL DATA:  Endotracheal tube position.  EXAM: PORTABLE CHEST - 1 VIEW  COMPARISON:  04/12/2013  FINDINGS: Endotracheal tube has tip 4.4 cm above the carina. Nasogastric tube unchanged. Right IJ central venous catheter unchanged. Left-sided chest tube unchanged. Left-sided pacemaker unchanged. Lungs are adequately inflated and demonstrate hazy bilateral perihilar opacification with slightly improved aeration. Mild left basilar opacification unchanged. No evidence of pneumothorax. Possible small amount of left pleural fluid. Cardiomediastinal silhouette and remainder of the exam is unchanged.  IMPRESSION: Bilateral perihilar opacification slightly improved likely interstitial edema. Mild stable left basilar opacification. Cannot exclude infection. Possible small amount of left pleural fluid.  Tubes and lines as described.   Electronically Signed   By: Marin Olp M.D.   On: 04/13/2013 07:09   Dg Chest Port 1 View  04/12/2013   CLINICAL DATA:  Evaluate airspace disease/ARDS.  EXAM: PORTABLE CHEST - 1 VIEW  COMPARISON:  Chest radiograph April 11, 2013   FINDINGS: Endotracheal tube tip projects 3.4 cm above the carina. Nasogastric tube side for past the gastroesophageal junction low distal tip is not imaged. Right internal jugular central venous catheter with distal tip projecting in distal superior vena cava. Left midlung zone pigtail drainage catheter with retaining looped within the chest wall. Slightly decreased left midlung zone consolidation with worsening right lower lobe and right perihilar consolidation, with increasing interstitial prominence. No definite pleural effusions. No pneumothorax.  Biventricular pacemaker in place in the left chest. Decreased left chest wall subcutaneous emphysema.  IMPRESSION: No apparent change in position of life support lines.  Improved left, worsening right lung consolidations may reflect ARDS, possibly pneumonia with increasing interstitial prominence suggesting pulmonary edema.   Electronically Signed   By: Elon Alas   On: 04/12/2013 05:22    Assessment/Plan:  A: Acute respiratory failure with ARDS in setting of gastroenteritis >> ?PNA vs acute lung injury.  Also she was on amiodarone as outpt.  Has elevated ESR, but this is non specific. P: -continue vent support >> d/w family about long term prognosis and likely need for tracheostomy and long term vent support -f/u serology -defer corticosteroids for now -continue vancomycin, zosyn, clindamycin -f/u repeat blood, sputum, urine cx's from 2/11  A: Subcutaneous emphysema over abdomen in setting of Lt pneumothorax.  Her abdominal exam is unremarkable. P: -monitor clinically  Had extensive discussion with pt's family at bedside.  Discussed CT chest findings and possible differential.  Also discussed CT abdomen findings and that current clinical exam does not support fasciitis, but this needs close clinical monitoring.  Also discussed long term prognosis, and likely need for tracheostomy and long term vent support if they wish to continue aggressive  care.  CC time 40 minutes.  Chesley Mires, MD [facility] Pulmonary/Critical Care 04/13/2013, 7:47 PM Pager:  [phone] After 3pm call: [phone]

## 2013-04-13 NOTE — Progress Notes (Signed)
Hypotensive event, 500 cc NaCl bolus given, [facility] notified.

## 2013-04-13 NOTE — Progress Notes (Signed)
[name]    Name: [name] MRN: [id number] DOB: Nov 11, 1932   LOS 9 days   ADMISSION DATE:  04/04/2013 CONSULTATION DATE:  04/05/13  REFERRING MD :  Dr. Vanessa Barbara  PRIMARY SERVICE: [facility]-->PCCM  CHIEF COMPLAINT:  Hypotension   BRIEF PATIENT DESCRIPTION: 80 y/o F admitted 2/2 with respiratory failure, hypovolemic and presumed septic shock -souuce unclear.  Course complicated by ARDS.  2/8 am suffered mucus plugging with ETT obstruction with subsequent cardiac arrest.  Required CPR x 9 mins, improved with re-intubation & left pneumothorax decompression  SIGNIFICANT EVENTS / STUDIES:  2/03 - Admit with fever, SOB, vomiting, diarrhea (concern for food poisoning) & CXR concerning for interstitial pneumonitis 2/03 - 2 d echo>>>Ef 45% grade 1 diastolic dysfunction  2/08 - resp arrest with mucus plugging, ETT exchanged 2/09 - changed to PCV overnight 2/10 - febrile overnight, hypotension requiring pressors  LINES / TUBES: L IJ TLC 2/3>>> 2/4 OTT>>>2/8, 2/8>>> 2/4 a line>> 2/4 OGT>>  CULTURES: MRSA PCR 2/3>>> negative BCx2 2/3>>>neg Sputum 2/3>>> normal flora UA 2/3>>> negative nit and LE Resp Viral Panel 2/3>>>negative 2/4 tracheal aspirate>>normal flora Stool culture, o&p 2/5>>> Stool PCR 04/11/13>> .................. BCx2 2/11>>> Sputum 2/11>>> UA 2/11>>> UC 2/11>>> PCT 2/11>>> Lactic Acid 2/11>>>  ANTIBIOTICS: Rocephin 2/3>>>2/9 Azithro 2/3>>>2/9 Tamiflu 2/3>>>2/4    SUBJECTIVE:  Febrile overnight:  tmax 101.1, medicated with tylenol, required levophed overnight briefly.  CVP 10.  RN notes twitching and poor responsiveness   VITAL SIGNS: Temp:  [98.2 F (36.8 C)-101.1 F (38.4 C)] 100.6 F (38.1 C) (02/11 0815) Pulse Rate:  [70-95] 81 (02/11 0815) Resp:  [14-39] 25 (02/11 0815) BP: (91-144)/(30-72) 102/30 mmHg (02/11 0745) SpO2:  [87 %-97 %] 96 % (02/11 0815) Arterial Line BP: (64-175)/(34-74) 152/60 mmHg (02/11 0815) FiO2 (%):  [45 %-100 %] 60 % (02/11  0800) Weight:  [194 lb 10.7 oz (88.3 kg)] 194 lb 10.7 oz (88.3 kg) (02/11 0356)  HEMODYNAMICS: CVP:  [10 mmHg-27 mmHg] 10 mmHg  VENTILATOR SETTINGS: Vent Mode:  [-] PCV FiO2 (%):  [45 %-100 %] 60 % Set Rate:  [14 bmp] 14 bmp PEEP:  [0 cmH20-10 cmH20] 10 cmH20 Plateau Pressure:  [22 cmH20-27 cmH20] 26 cmH20  INTAKE / OUTPUT: Intake/Output     02/10 0701 - 02/11 0700 02/11 0701 - 02/12 0700   I.V. (mL/kg) 2551.5 (28.9) 67.1 (0.8)   NG/GT 760 100   Total Intake(mL/kg) 3311.5 (37.5) 167.1 (1.9)   Urine (mL/kg/hr) 3475 (1.6) 45 (0.3)   Chest Tube  100 (0.6)   Total Output 3475 145   Net -163.5 +22.1          PHYSICAL EXAMINATION: General:  wdwn elderly female in NAD  Neuro:  Sedated on vent, RASS -1, intermittent agitation  HEENT:  Mm pink/dry, no jvd, OETT Cardiovascular:  s1s2 rrr, paced on monitor Lungs:  resps even non labored on vent, diminished bilaterally, left chest tube - without air leak Abdomen:  Round/soft, bsx4 active Musculoskeletal:  No acute deformities  Skin:  Warm/dry, no edema  LABS:  CBC  Recent Labs Lab 04/11/13 0305 04/12/13 0409 04/13/13 0447  WBC 13.4* 12.9* 34.6*  HGB 10.9* 10.9* 11.3*  HCT 32.8* 33.7* 36.0  PLT PLATELET CLUMPS NOTED ON SMEAR, UNABLE TO ESTIMATE PLATELET CLUMPS NOTED ON SMEAR, UNABLE TO ESTIMATE PLATELET CLUMPS NOTED ON SMEAR, UNABLE TO ESTIMATE   Coag's No results found for this basename: APTT, INR,  in the last 168 hours BMET  Recent Labs Lab 04/11/13 0305  04/12/13 0409 04/13/13 0447  NA 144 148* 149*  K 3.1* 5.0 5.5*  CL 107 112 113*  CO2 $Re'28 30 29  'ptm$ BUN 39* 33* 42*  CREATININE 1.20* 1.05 1.22*  GLUCOSE 170* 188* 209*   Electrolytes  Recent Labs Lab 04/09/13 0400  04/11/13 0305 04/12/13 0409 04/13/13 0447  CALCIUM 7.1*  < > 7.0* 7.7* 7.8*  MG 2.1  --   --  2.5 2.2  PHOS 2.7  --   --  2.4 3.0  < > = values in this interval not displayed. Sepsis Markers  Recent Labs Lab 04/07/13 0515  PROCALCITON  13.00   ABG  Recent Labs Lab 04/10/13 0828 04/10/13 1250 04/12/13 0940  PHART 7.374 7.424 7.416  PCO2ART 39.1 41.4 43.1  PO2ART 58.7* 65.6* 63.7*   Liver Enzymes  Recent Labs Lab 04/09/13 0400  AST 19  ALT 49*  ALKPHOS 43  BILITOT <0.2*  ALBUMIN 1.8*   Cardiac Enzymes  Recent Labs Lab 04/06/13 1210 04/13/13 0447  TROPONINI <0.30  --   PROBNP  --  2694.0*   Glucose  Recent Labs Lab 04/12/13 1128 04/12/13 1645 04/12/13 1921 04/12/13 2356 04/13/13 0348 04/13/13 0757  GLUCAP 184* 170* 174* 125* 155* 168*    Recent Labs Lab 04/13/13 0447  PROBNP 2694.0*    Imaging Dg Chest Port 1 View  04/13/2013   CLINICAL DATA:  Endotracheal tube position.  EXAM: PORTABLE CHEST - 1 VIEW  COMPARISON:  04/12/2013  FINDINGS: Endotracheal tube has tip 4.4 cm above the carina. Nasogastric tube unchanged. Right IJ central venous catheter unchanged. Left-sided chest tube unchanged. Left-sided pacemaker unchanged. Lungs are adequately inflated and demonstrate hazy bilateral perihilar opacification with slightly improved aeration. Mild left basilar opacification unchanged. No evidence of pneumothorax. Possible small amount of left pleural fluid. Cardiomediastinal silhouette and remainder of the exam is unchanged.  IMPRESSION: Bilateral perihilar opacification slightly improved likely interstitial edema. Mild stable left basilar opacification. Cannot exclude infection. Possible small amount of left pleural fluid.  Tubes and lines as described.   Electronically Signed   By: Marin Olp M.D.   On: 04/13/2013 07:09   Dg Chest Port 1 View  04/12/2013   CLINICAL DATA:  Evaluate airspace disease/ARDS.  EXAM: PORTABLE CHEST - 1 VIEW  COMPARISON:  Chest radiograph April 11, 2013  FINDINGS: Endotracheal tube tip projects 3.4 cm above the carina. Nasogastric tube side for past the gastroesophageal junction low distal tip is not imaged. Right internal jugular central venous catheter with distal  tip projecting in distal superior vena cava. Left midlung zone pigtail drainage catheter with retaining looped within the chest wall. Slightly decreased left midlung zone consolidation with worsening right lower lobe and right perihilar consolidation, with increasing interstitial prominence. No definite pleural effusions. No pneumothorax.  Biventricular pacemaker in place in the left chest. Decreased left chest wall subcutaneous emphysema.  IMPRESSION: No apparent change in position of life support lines.  Improved left, worsening right lung consolidations may reflect ARDS, possibly pneumonia with increasing interstitial prominence suggesting pulmonary edema.   Electronically Signed   By: Elon Alas   On: 04/12/2013 05:22    ASSESSMENT / PLAN:  PULMONARY A: Acute Respiratory Failure - concern for viral process with pneumonitis vs possible aspiration with vomiting  B infiltrates > ddx = ARDS, viral pneumonitis, bacterial, consider amiodarone or other non-infectious inflammatory process.  Note she has had a similar hospitalization 2-3 yrs ago. Consider COP, acute amiodarone rxn.  Favor viral or non-infectious etiology  ARDS  Mucus plug - 2/8 with resp arrest Left pneumothorax s/p chest tube - post CPR P:   -continue PCV -Vent bundle -monitor off abx -mucomyst & nebs -CT to 20 cm sxn -trend cxr -assess CT chest to evaluate infiltrates, fever -consider trial steroids pending on CT , ESR and autoimune profile esp if ALI due to amio a concern  CARDIOVASCULAR A:  Septic and Hypovolemic Shock Cardiac arrest 2/8 am due to ETT obstruction Hx HTN Hx CHF / ICM s/p Pacemeker - compensated.  EF 55-60% 2/10 AFib on amiodarone P:  -hold oral amiodarone -hold zocor -d/c scheduled lasix  RENAL A:   Acute Kidney Injury - at risk for worsening post arrest.  Some worse 04/13/13 after lasix P:   -trend BMP -replace lytes as indicated  - dc lasix and kcl (Staff MD note)  GASTROINTESTINAL A:    Nausea / Vomiting / Diarrhea - resolved P:   -follow pending stool cultures (have not been sent / no sample) -PPI -zofran PRN  -TF per nutrition    HEMATOLOGIC A:   Leukocytosis DVT Prophylaxis  P:  -heparin for DVT proph -monitor cbc   INFECTIOUS A:   Gastroenteritis, Diarrhea - resolved. R/O CAP vs Viral Respiratory Illness - PCT elevated but trending down (44->27->14), favors infectious cause RVP neg - off tamiflu   Fever - noted off abx 2/10.   P:   -ceftriaxone + azithro completed 2/9 -stool O&P pending -check stool PCR -re-culture & initiate broad spectrum abx 2/11   ENDOCRINE A:   Hypothyroidism   P:   -continue synthroid  NEUROLOGIC A:   At Risk Anoxic Encephalopathy s/p CPR P:   -cont fent gtt, PRN versed -precedex -Serial exams -daily WUA  -CT head   GLOBAL: -pending goals of care with family per Palliative Medicine (staff MD comment: aim for consult is to help family support with decision making, Not unreasonable for trach, ltac, peg as time limited triad for few to several months) but need to ensuer family understands chronic critial illness involves morbidity/mortality and uncertain prognosis  -Husband updated on patients status via phone 2/11 by NP  Noe Gens, NP-C [employer] Pgr: [phone] or [phone] 04/13/2013, 8:59 AM   STAFF NOTE: I, Dr Ann Lions have personally reviewed patient's available data, including medical history, events of note, physical examination and test results as part of my evaluation. I have discussed with resident/NP and other care providers such as pharmacist, RN and RRT.  In addition,  I personally evaluated patient and elicited key findings of *- see staf comments on note. Await goals of care meeting. Await CT head, CT chest, autoimmune profile. If ESR high or autoimmune profile positive, consider steroids.  Rest per NP/medical resident whose note is outlined above and that I agree with  The  patient is critically ill with multiple organ systems failure and requires high complexity decision making for assessment and support, frequent evaluation and titration of therapies, application of advanced monitoring technologies and extensive interpretation of multiple databases.   Critical Care Time devoted to patient care services described in this note is  35  Minutes.  Dr. Brand Males, M.D., [facility].C.P Pulmonary and Critical Care Medicine Staff Physician [facility] Pager: [phone], If no answer or between  15:00h - 7:00h: call [phone]  04/13/2013 11:02 AM

## 2013-04-13 NOTE — Progress Notes (Signed)
Tylenol given

## 2013-04-13 NOTE — Progress Notes (Signed)
eLink Physician-Brief Progress Note Patient Name: NATISHIA GELBER DOB: 06-06-1932 MRN: [id number]  Date of Service  04/13/2013   HPI/Events of Note   CT results reviewed / discussed with family. Concern for air in abdominal wall / intraperitoneal - possible tracking form pneumothorax, but also given inflammatory changes and increased WBC necrotizing fasciitis is of concern. Abdomen is benign this am.  Discussed with Dr. Marchelle Gearing.   eICU Interventions   Bedside MD to see and examine CK, lactate, amylase, lipase sent Clindamycin added Already on Vancomycin / Zosyn Will hold starting systemic steroids for now    Intervention Category Major Interventions: Other:  Ayumi Wangerin 04/13/2013, 6:00 PM

## 2013-04-13 NOTE — Therapy (Signed)
Femoral Arterial Line due to be pulled. Canary Brim, NP said not to pull A-line and keep for today.

## 2013-04-13 NOTE — Progress Notes (Signed)
ANTIBIOTIC CONSULT NOTE  Pharmacy Consult for antibiotic renal dose adjustment (Vanc, Zosyn, Clinda) Indication: rule out sepsis, cellulitis  Labs:  Recent Labs  04/11/13 0305 04/12/13 0409 04/13/13 0447  WBC 13.4* 12.9* 34.6*  HGB 10.9* 10.9* 11.3*  PLT PLATELET CLUMPS NOTED ON SMEAR, UNABLE TO ESTIMATE PLATELET CLUMPS NOTED ON SMEAR, UNABLE TO ESTIMATE PLATELET CLUMPS NOTED ON SMEAR, UNABLE TO ESTIMATE  CREATININE 1.20* 1.05 1.22*   Estimated Creatinine Clearance: 40.1 ml/min (by C-G formula based on Cr of 1.22).  Assessment: 35 yoF admitted with respiratory failure and probable CAP.  Completed 7 days of azithromycin and Ceftriaxone on 2/8.  On 2/8, suffered mucus plugging with ETT obstruction with subsequent cardiac arrest.   Vancomycin and Zosyn started 2/11 for broad spectrum coverage of possible sepsis.  Clindamycin added tonight for cellulitis.  SCr 1.22, CrCl ~ 40 ml/min  Goal of Therapy:  Vancomycin trough level 15-20 mcg/ml Appropriate abx dosing, eradication of infection.   Plan:   Clindamycin 600mg  IV Q8h   Zosyn 3.375g IV q8h (4 hour infusion time).  Vancomycin 1500 mg IV loading dose then 750 mg IV q12h.   Lynann Beaver PharmD, BCPS Pager [phone] 04/13/2013 6:13 PM

## 2013-04-13 NOTE — Progress Notes (Signed)
ANTIBIOTIC CONSULT NOTE - INITIAL  Pharmacy Consult for Vancomycin, Zosyn Indication: rule out sepsis  Allergies  Allergen Reactions  . Beta Adrenergic Blockers Other (See Comments)    Not working well for pt.  Thayer Jew Hcl] Shortness Of Breath  . Carvedilol Shortness Of Breath    Chest  Discomfort.  . Dapsone Other (See Comments)    Kidney  failure  . Diclofenac Sodium Other (See Comments)    Causes  Fluid  Retention.  Chanda Busing [Pitavastatin Calcium] Shortness Of Breath  . Methotrexate Shortness Of Breath    Mouth sores.  . Metoprolol Succinate Hives  . Oxaprozin Diarrhea  . Other     Fragrance mixtures -  Cinol botanicals  &  Plants oil. Cocamidoproy   -   Betaine. Lipstick & other makeup & cleaning materials.   Marland Kitchen Epinephrine     Patient Measurements: Height: 5\' 3"  (160 cm) Weight: 194 lb 10.7 oz (88.3 kg) IBW/kg (Calculated) : 52.4  Vital Signs: Temp: 100.9 F (38.3 C) (02/11 1000) Temp src: Core (Comment) (02/11 0800) BP: 102/30 mmHg (02/11 0745) Pulse Rate: 74 (02/11 1000) Intake/Output from previous day: 02/10 0701 - 02/11 0700 In: 3311.5 [I.V.:2551.5; NG/GT:760] Out: 5035 [Urine:3475] Intake/Output from this shift: Total I/O In: 475.9 [I.V.:195.9; NG/GT:280] Out: 165 [Urine:65; Chest Tube:100]  Labs:  Recent Labs  04/11/13 0305 04/12/13 0409 04/13/13 0447  WBC 13.4* 12.9* 34.6*  HGB 10.9* 10.9* 11.3*  PLT PLATELET CLUMPS NOTED ON SMEAR, UNABLE TO ESTIMATE PLATELET CLUMPS NOTED ON SMEAR, UNABLE TO ESTIMATE PLATELET CLUMPS NOTED ON SMEAR, UNABLE TO ESTIMATE  CREATININE 1.20* 1.05 1.22*   Estimated Creatinine Clearance: 40.1 ml/min (by C-G formula based on Cr of 1.22). No results found for this basename: VANCOTROUGH, Corlis Leak, VANCORANDOM, [place], GENTPEAK, GENTRANDOM, TOBRATROUGH, TOBRAPEAK, TOBRARND, AMIKACINPEAK, AMIKACINTROU, AMIKACIN,  in the last 72 hours   Microbiology: Recent Results (from the past 720 hour(s))  CULTURE,  BLOOD (ROUTINE X 2)     Status: None   Collection Time    04/04/13 10:15 PM      Result Value Ref Range Status   Specimen Description BLOOD LEFT ANTECUBITAL   Final   Special Requests BOTTLES DRAWN AEROBIC ONLY 3CC   Final   Culture  Setup Time     Final   Value: 04/05/2013 01:11     Performed at [facility]   Culture     Final   Value: NO GROWTH 5 DAYS     Performed at [facility]   Report Status 04/11/2013 FINAL   Final  CULTURE, BLOOD (ROUTINE X 2)     Status: None   Collection Time    04/04/13 10:20 PM      Result Value Ref Range Status   Specimen Description BLOOD LEFT HAND   Final   Special Requests BOTTLES DRAWN AEROBIC ONLY 3CC   Final   Culture  Setup Time     Final   Value: 04/05/2013 01:12     Performed at [facility]   Culture     Final   Value: NO GROWTH 5 DAYS     Performed at [facility]   Report Status 04/11/2013 FINAL   Final  MRSA PCR SCREENING     Status: None   Collection Time    04/05/13  1:26 AM      Result Value Ref Range Status   MRSA by PCR NEGATIVE  NEGATIVE Final   Comment:  The GeneXpert MRSA Assay (FDA     approved for NASAL specimens     only), is one component of a     comprehensive MRSA colonization     surveillance program. It is not     intended to diagnose MRSA     infection nor to guide or     monitor treatment for     MRSA infections.  CULTURE, EXPECTORATED SPUTUM-ASSESSMENT     Status: None   Collection Time    04/05/13  6:34 AM      Result Value Ref Range Status   Specimen Description SPUTUM   Final   Special Requests Normal   Final   Sputum evaluation     Final   Value: THIS SPECIMEN IS ACCEPTABLE. RESPIRATORY CULTURE REPORT TO FOLLOW.   Report Status 04/05/2013 FINAL   Final  CULTURE, RESPIRATORY (NON-EXPECTORATED)     Status: None   Collection Time    04/05/13  6:34 AM      Result Value Ref Range Status   Specimen Description SPUTUM   Final   Special Requests NONE   Final    Gram Stain     Final   Value: FEW WBC PRESENT,BOTH PMN AND MONONUCLEAR     RARE SQUAMOUS EPITHELIAL CELLS PRESENT     MODERATE GRAM POSITIVE COCCI     IN PAIRS RARE GRAM NEGATIVE RODS     Performed at [facility]   Culture     Final   Value: NORMAL OROPHARYNGEAL FLORA     Performed at [facility]   Report Status 04/07/2013 FINAL   Final  RESPIRATORY VIRUS PANEL     Status: None   Collection Time    04/05/13 11:00 AM      Result Value Ref Range Status   Source - RVPAN NASAL SWAB   Corrected   Comment: CORRECTED ON 02/04 AT 1911: PREVIOUSLY REPORTED AS NASAL SWAB   Respiratory Syncytial Virus A NOT DETECTED   Final   Respiratory Syncytial Virus B NOT DETECTED   Final   Influenza A NOT DETECTED   Final   Influenza B NOT DETECTED   Final   Parainfluenza 1 NOT DETECTED   Final   Parainfluenza 2 NOT DETECTED   Final   Parainfluenza 3 NOT DETECTED   Final   Metapneumovirus NOT DETECTED   Final   Rhinovirus NOT DETECTED   Final   Adenovirus NOT DETECTED   Final   Influenza A H1 NOT DETECTED   Final   Influenza A H3 NOT DETECTED   Final   Comment: (NOTE)           Normal Reference Range for each Analyte: NOT DETECTED     Testing performed using the Luminex xTAG Respiratory Viral Panel test     kit.     This test was developed and its performance characteristics determined     by [facility]. It has not been cleared or approved by the [place]     Food and Drug Administration. This test is used for clinical purposes.     It should not be regarded as investigational or for research. This     laboratory is certified under the [place] (CLIA) as qualified to perform high complexity     clinical laboratory testing.     Performed at [place], RESPIRATORY (NON-EXPECTORATED)     Status: None  Collection Time    04/06/13  1:30 PM      Result Value Ref Range Status   Specimen Description TRACHEAL  ASPIRATE   Final   Special Requests Normal   Final   Gram Stain     Final   Value: RARE WBC PRESENT, PREDOMINANTLY PMN     NO SQUAMOUS EPITHELIAL CELLS SEEN     NO ORGANISMS SEEN     Performed at [facility]   Culture     Final   Value: NO GROWTH 2 DAYS     Performed at [facility]   Report Status 04/08/2013 FINAL   Final  CULTURE, RESPIRATORY (NON-EXPECTORATED)     Status: None   Collection Time    04/08/13  4:55 PM      Result Value Ref Range Status   Specimen Description SPUTUM   Final   Special Requests NONE   Final   Gram Stain     Final   Value: MODERATE WBC PRESENT,BOTH PMN AND MONONUCLEAR     NO SQUAMOUS EPITHELIAL CELLS SEEN     NO ORGANISMS SEEN     Performed at [facility]   Culture     Final   Value: NO GROWTH 2 DAYS     Performed at [facility]   Report Status 04/11/2013 FINAL   Final    Medical History: Past Medical History  Diagnosis Date  . Hypothyroid   . Complete heart block 2002    Initial PPM 2002 with upgrade by JA to CRT-P 11/21/08  . Nonischemic cardiomyopathy   . Persistent atrial fibrillation   . Hypertension   . Hyperlipidemia   . Hives 4/10  . Cataract 4/13    right  . Renal failure     3rd stage-seeing neurologist    Assessment: 38 yoF admitted with respiratory failure and probable CAP.  Completed 7 days of azithromycin and Ceftriaxone on 2/8.  On 2/8, suffered mucus plugging with ETT obstruction with subsequent cardiac arrest. Required CPR x 9 mins, improved with re-intubation & left pneumothorax decompression.  Became febrile and hypotensive overnight requiring norepinephrine support.  Starting IV Vancomycin and Zosyn for broad spectrum coverage of possible sepsis.  CXR this AM cannot exclude infection.  WBC increased to 34.6  Tmax 100.9  SCr 1.22, CrCl~42 ml/min/1.87m2 (normalized), ~51 ml/min (CG)  No growth to date on cultures.  Goal of Therapy:  Vancomycin trough level 15-20 mcg/ml  Plan:   1.  Zosyn 3.375g IV q8h (4 hour infusion time). 2.  Vancomycin 1500 mg IV loading dose then 750 mg IV q12h.  Hershal Coria 04/13/2013,10:13 AM

## 2013-04-13 NOTE — Therapy (Signed)
Pt transported to CT and Back with ventilator.

## 2013-04-13 NOTE — Progress Notes (Signed)
Levophed increased to 78mcq/min, system downtime

## 2013-04-14 ENCOUNTER — Inpatient Hospital Stay (HOSPITAL_COMMUNITY): Payer: Medicare Other

## 2013-04-14 DIAGNOSIS — R404 Transient alteration of awareness: Secondary | ICD-10-CM

## 2013-04-14 LAB — URINE CULTURE

## 2013-04-14 LAB — GLUCOSE, CAPILLARY
GLUCOSE-CAPILLARY: 170 mg/dL — AB (ref 70–99)
GLUCOSE-CAPILLARY: 271 mg/dL — AB (ref 70–99)
Glucose-Capillary: 149 mg/dL — ABNORMAL HIGH (ref 70–99)
Glucose-Capillary: 181 mg/dL — ABNORMAL HIGH (ref 70–99)
Glucose-Capillary: 197 mg/dL — ABNORMAL HIGH (ref 70–99)

## 2013-04-14 LAB — CBC
HEMATOCRIT: 32.1 % — AB (ref 36.0–46.0)
HEMOGLOBIN: 10.1 g/dL — AB (ref 12.0–15.0)
MCH: 30.7 pg (ref 26.0–34.0)
MCHC: 31.5 g/dL (ref 30.0–36.0)
MCV: 97.6 fL (ref 78.0–100.0)
Platelets: UNDETERMINED 10*3/uL (ref 150–400)
RBC: 3.29 MIL/uL — ABNORMAL LOW (ref 3.87–5.11)
RDW: 15.8 % — ABNORMAL HIGH (ref 11.5–15.5)
WBC: 20.5 10*3/uL — ABNORMAL HIGH (ref 4.0–10.5)

## 2013-04-14 LAB — MPO/PR-3 (ANCA) ANTIBODIES
Myeloperoxidase Abs: <1
Serine Protease 3: <1

## 2013-04-14 LAB — BLOOD GAS, ARTERIAL
Acid-Base Excess: 2.7 mmol/L — ABNORMAL HIGH (ref 0.0–2.0)
BICARBONATE: 26.8 meq/L — AB (ref 20.0–24.0)
DRAWN BY: 103701
FIO2: 0.6 %
O2 Saturation: 90 %
PEEP: 10 cmH2O
PH ART: 7.405 (ref 7.350–7.450)
Patient temperature: 101.2
Pressure control: 8 cmH2O
RATE: 14 resp/min
TCO2: 23.7 mmol/L (ref 0–100)
pCO2 arterial: 44.6 mmHg (ref 35.0–45.0)
pO2, Arterial: 66.2 mmHg — ABNORMAL LOW (ref 80.0–100.0)

## 2013-04-14 LAB — BASIC METABOLIC PANEL
BUN: 35 mg/dL — AB (ref 6–23)
CHLORIDE: 111 meq/L (ref 96–112)
CO2: 29 meq/L (ref 19–32)
Calcium: 7.8 mg/dL — ABNORMAL LOW (ref 8.4–10.5)
Creatinine, Ser: 1.03 mg/dL (ref 0.50–1.10)
GFR calc non Af Amer: 51 mL/min — ABNORMAL LOW (ref >90)
GFR, EST AFRICAN AMERICAN: 59 mL/min — AB (ref >90)
Glucose, Bld: 190 mg/dL — ABNORMAL HIGH (ref 70–99)
POTASSIUM: 5 meq/L (ref 3.7–5.3)
Sodium: 147 mEq/L (ref 137–147)

## 2013-04-14 LAB — ANCA SCREEN W REFLEX TITER
ATYPICAL P-ANCA SCREEN: NEGATIVE
c-ANCA Screen: NEGATIVE
p-ANCA Screen: NEGATIVE

## 2013-04-14 LAB — CYCLIC CITRUL PEPTIDE ANTIBODY, IGG: Cyclic Citrullin Peptide Ab: <2 U/mL (ref 0.0–5.0)

## 2013-04-14 LAB — ANTI-SCLERODERMA ANTIBODY: Scleroderma (Scl-70) (ENA) Antibody, IgG: <1

## 2013-04-14 LAB — TSH: TSH: 2.192 u[IU]/mL (ref 0.350–4.500)

## 2013-04-14 LAB — PROCALCITONIN: Procalcitonin: 4.31 ng/mL

## 2013-04-14 MED ORDER — METHYLPREDNISOLONE SODIUM SUCC 125 MG IJ SOLR
80.0000 mg | Freq: Four times a day (QID) | INTRAMUSCULAR | Status: DC
  Administered 2013-04-14 – 2013-04-18 (×16): 80 mg via INTRAVENOUS
  Filled 2013-04-14: qty 1.28
  Filled 2013-04-14: qty 2
  Filled 2013-04-14 (×19): qty 1.28

## 2013-04-14 MED ORDER — FLUCONAZOLE IN SODIUM CHLORIDE 200-0.9 MG/100ML-% IV SOLN
200.0000 mg | Freq: Once | INTRAVENOUS | Status: AC
  Administered 2013-04-14: 200 mg via INTRAVENOUS
  Filled 2013-04-14: qty 100

## 2013-04-14 MED ORDER — FUROSEMIDE 10 MG/ML IJ SOLN
20.0000 mg | Freq: Once | INTRAMUSCULAR | Status: AC
  Administered 2013-04-14: 20 mg via INTRAVENOUS
  Filled 2013-04-14: qty 2

## 2013-04-14 MED ORDER — FLUTICASONE PROPIONATE 50 MCG/ACT NA SUSP
2.0000 | Freq: Two times a day (BID) | NASAL | Status: DC
Start: 2013-04-14 — End: 2013-04-18
  Administered 2013-04-14 – 2013-04-17 (×7): 2 via NASAL
  Filled 2013-04-14: qty 16

## 2013-04-14 NOTE — Therapy (Signed)
Changed Arterial Line Set-up

## 2013-04-14 NOTE — Progress Notes (Signed)
Name: [name] MRN: [id number] DOB: [birth year]   LOS 10 days   ADMISSION DATE:  04/04/2013 CONSULTATION DATE:  04/05/13  REFERRING MD :  Dr. Vanessa Barbara  PRIMARY SERVICE: TRH-->PCCM  CHIEF COMPLAINT:  Hypotension   BRIEF PATIENT DESCRIPTION: 80 y/o F admitted 2/2 with respiratory failure, hypovolemic and presumed septic shock -souuce unclear.  Course complicated by ARDS.  2/8 am suffered mucus plugging with ETT obstruction with subsequent cardiac arrest.  Required CPR x 9 mins, improved with re-intubation & left pneumothorax decompression  SIGNIFICANT EVENTS / STUDIES:  2/03 - Admit with fever, SOB, vomiting, diarrhea (concern for food poisoning) & CXR concerning for interstitial pneumonitis 2/03 - 2 d echo>>>Ef 45% grade 1 diastolic dysfunction  2/08 - resp arrest with mucus plugging, ETT exchanged 2/09 - changed to PCV overnight 2/10 - febrile overnight, hypotension requiring pressors 2/11 - CT Chest/ABD/Pelvis>>ARDS, R pleural effusion, L CT, Air in sq of anterior abd with marked inflammation 2/11 - CT Head>>neg  LINES / TUBES: L IJ TLC 2/3>>> 2/4 OTT>>>2/8, 2/8>>> 2/4 a line>> 2/4 OGT>>  CULTURES: MRSA PCR 2/3>>> negative BCx2 2/3>>>neg Sputum 2/3>>> normal flora UA 2/3>>> negative nit and LE Resp Viral Panel 2/3>>>negative 2/4 tracheal aspirate>>normal flora Stool culture, o&p 2/5>>> Stool PCR 04/11/13>> .................. BCx2 2/11>>> Sputum 2/11>>> UA 2/11>>>many yeast UC 2/11>>> PCT 2/11>>>4.31 (down from 44) Lactic Acid 2/11>>>1.6  ANTIBIOTICS: Rocephin 2/3>>>2/9 Azithro 2/3>>>2/9 Tamiflu 2/3>>>2/4 Clinda 2/12>>> Vanco 2/12>>> Zosyn 2/12>>> Diflucan 2/12 (yeast in urine) x1    SUBJECTIVE:  No acute events overnight.  Low grade fever.    VITAL SIGNS: Temp:  [99.9 F (37.7 C)-101.8 F (38.8 C)] 99.9 F (37.7 C) (02/12 0400) Pulse Rate:  [70-79] 71 (02/12 0419) Resp:  [14-22] 20 (02/12 0400) BP: (103-133)/(32-51) 103/46 mmHg (02/12 0400) SpO2:   [90 %-99 %] 97 % (02/12 0419) Arterial Line BP: (89-135)/(40-109) 117/45 mmHg (02/12 0400) FiO2 (%):  [60 %] 60 % (02/12 0419) Weight:  [198 lb 3.1 oz (89.9 kg)] 198 lb 3.1 oz (89.9 kg) (02/12 0500)  HEMODYNAMICS: CVP:  [10 mmHg-18 mmHg] 15 mmHg  VENTILATOR SETTINGS: Vent Mode:  [-] PCV FiO2 (%):  [60 %] 60 % Set Rate:  [14 bmp] 14 bmp PEEP:  [10 cmH20] 10 cmH20 Plateau Pressure:  [20 cmH20-24 cmH20] 23 cmH20  INTAKE / OUTPUT: Intake/Output     02/11 0701 - 02/12 0700 02/12 0701 - 02/13 0700   I.V. (mL/kg) 1816.9 (20.2)    NG/GT 820    IV Piggyback 850    Total Intake(mL/kg) 3486.9 (38.8)    Urine (mL/kg/hr) 1875 (0.9)    Chest Tube 200 (0.1)    Total Output 2075     Net +1411.9            PHYSICAL EXAMINATION: General:  wdwn elderly female in NAD  Neuro:  Sedated on vent, RASS -1, intermittent agitation  HEENT:  Mm pink/dry, no jvd, OETT Cardiovascular:  s1s2 rrr, paced on monitor Lungs:  resps even non labored on vent, diminished bilaterally, left chest tube - without air leak Abdomen:  Round/soft, bsx4 active Musculoskeletal:  No acute deformities  Skin:  Warm/dry, generalized edema.  L great toe with erythema / warmth c/w gout flare  LABS:  PULMONARY  Recent Labs Lab 04/10/13 0828 04/10/13 1250 04/12/13 0940 04/13/13 1455 04/14/13 1121  PHART 7.374 7.424 7.416 7.445 7.405  PCO2ART 39.1 41.4 43.1 39.5 44.6  PO2ART 58.7* 65.6* 63.7* 67.2* 66.2*  HCO3 22.3 26.6* 27.2* 26.2* 26.8*  TCO2 20.0 23.8 24.2 23.5 23.7  O2SAT 87.9 92.8 91.2 91.9 90.0    CBC  Recent Labs Lab 04/12/13 0409 04/13/13 0447 04/14/13 0400  HGB 10.9* 11.3* 10.1*  HCT 33.7* 36.0 32.1*  WBC 12.9* 34.6* 20.5*  PLT PLATELET CLUMPS NOTED ON SMEAR, UNABLE TO ESTIMATE PLATELET CLUMPS NOTED ON SMEAR, UNABLE TO ESTIMATE PLATELET CLUMPS NOTED ON SMEAR, UNABLE TO ESTIMATE    COAGULATION No results found for this basename: INR,  in the last 168 hours  CARDIAC  No results found for this  basename: TROPONINI,  in the last 168 hours  Recent Labs Lab 04/13/13 0447  PROBNP 2694.0*     CHEMISTRY  Recent Labs Lab 04/08/13 0430 04/09/13 0400 04/10/13 0505 04/11/13 0305 04/12/13 0409 04/13/13 0447 04/14/13 0400  NA 139 142 146 144 148* 149* 147  K 4.1 3.4* 3.1* 3.1* 5.0 5.5* 5.0  CL 108 107 106 107 112 113* 111  CO2 21 26 29 28 30 29 29   GLUCOSE 127* 144* 155* 170* 188* 209* 190*  BUN 19 32* 40* 39* 33* 42* 35*  CREATININE 1.03 1.27* 1.22* 1.20* 1.05 1.22* 1.03  CALCIUM 7.4* 7.1* 7.3* 7.0* 7.7* 7.8* 7.8*  MG 2.1 2.1  --   --  2.5 2.2  --   PHOS 2.1* 2.7  --   --  2.4 3.0  --    Estimated Creatinine Clearance: 47.9 ml/min (by C-G formula based on Cr of 1.03).   LIVER  Recent Labs Lab 04/09/13 0400  AST 19  ALT 49*  ALKPHOS 43  BILITOT <0.2*  PROT 5.0*  ALBUMIN 1.8*     INFECTIOUS  Recent Labs Lab 04/13/13 0448 04/13/13 0930 04/13/13 1800 04/14/13 0400  LATICACIDVEN  --  2.0 1.6  --   PROCALCITON 5.82  --   --  4.31     ENDOCRINE CBG (last 3)   Recent Labs  04/14/13 0035 04/14/13 0322 04/14/13 0805  GLUCAP 149* 197* 170*         IMAGING x48h  Ct Abdomen Pelvis Wo Contrast  04/13/2013   CLINICAL DATA:  ARDS.  Infiltrates.  Abdominal pain.  EXAM: CT CHEST, ABDOMEN AND PELVIS WITHOUT CONTRAST  TECHNIQUE: Multidetector CT imaging of the chest, abdomen and pelvis was performed following the standard protocol without IV contrast.  COMPARISON:  None.  FINDINGS: CT CHEST FINDINGS  The chest wall is unremarkable. No breast masses, supraclavicular or axillary adenopathy. A left-sided pacemaker is noted. The bony thorax is intact. No destructive bone lesions or spinal canal compromise.  The heart is mildly enlarged but no pericardial effusion. The aorta is normal in caliber. Atherosclerotic calcifications are noted. No mediastinal or hilar mass or adenopathy. Scattered lymph nodes are noted. The esophagus is grossly normal. There is a NG  tube coursing down into the stomach. A left-sided chest tube is noted.  Examination of the lung parenchyma demonstrates diffuse patchy airspace disease likely edema or ARDS. There is a moderate-sized right pleural effusion and bibasilar atelectasis. No pneumothorax is identified.  CT ABDOMEN AND PELVIS FINDINGS  The liver is grossly normal without contrast. No obvious lesion or biliary dilatation. The gallbladder is filled with high attenuation material which could be vicarious excretion related to a prior contrast-enhanced study or milk of calcium. No common bile duct dilatation. The pancreas is grossly normal. The spleen is normal in size. There is a left adrenal gland lesion which is an indeterminate finding measuring 18 Hounsfield units. No findings for hydronephrosis or  lobe both ureters are slightly prominent. This could be due to a distended bladder despite a Foley catheter.  The stomach, duodenum, small bowel and colon are grossly normal without oral contrast. No obvious inflammatory changes, mass lesions or obstructive findings. The uterus is surgically absent. Both ovaries are still present are grossly normal. Moderate atherosclerotic changes involving the aorta but no focal aneurysm. No mesenteric or retroperitoneal mass.  There is extensive air in the subcutaneous tissues of the anterior abdominal wall along with some air in the left rectus muscle. The right lower chest wall and upper right abdominal wall demonstrate large amount of fluid in inflammation. There is also a small amount of free intraperitoneal air. These findings could be due to a prior pneumothorax with dissection of air. Necrotizing fasciitis is also a remote possibility.  The bony structures are unremarkable. There is advanced degenerative disc disease and facet disease in the lower lumbar spine.  IMPRESSION: 1. Extensive airspace disease in the lungs likely due to ARDS. 2. Moderate-sized right pleural effusion with overlying  atelectasis. 3. Left-sided chest tube in place without definite pneumothorax. 4. Extensive air in the subcutaneous fat of the anterior abdominal wall along with marked inflammation and edema. This also scattered small areas of free intraperitoneal air. It is possible is dissecting down from a large pneumothorax. Has the patient had recent procedure? Necrotizing fasciitis is possible. 5. Distended bladder despite the Foley catheter. There is also mild hydroureter. 6. High attenuation material the gallbladder possibly due to vicarious excretion or milk of calcium   Electronically Signed   By: Loralie Champagne M.D.   On: 04/13/2013 17:31   Ct Head Wo Contrast  04/13/2013   CLINICAL DATA:  Status post cardiac arrest. Altered level of consciousness. Not following commands.  EXAM: CT HEAD WITHOUT CONTRAST  TECHNIQUE: Contiguous axial images were obtained from the base of the skull through the vertex without contrast.  COMPARISON:  CT head 03/15/2010.  FINDINGS: No evidence for acute infarction, hemorrhage, mass lesion, hydrocephalus, or extra-axial fluid. Normal for age cerebral volume. No significant white matter disease. Calvarium intact. Vascular calcification of the carotid siphon regions. Large retention cyst left maxillary sinus. Dependent fluid both mastoids and left middle ear cavity, nonspecific.  There are no specific features which suggest hypoxic-ischemic insult. Gray-white junction is preserved in the supratentorial region. There is no diffuse brain swelling or obliteration of the basilar cisterns. There is no evidence for large vessel infarct. Continued surveillance is warranted. If clinically indicated, MRI can be more sensitive in the detection of anoxic injury. Similar appearance to 2012.  IMPRESSION: Normal for age cerebral volume.  No acute intracranial findings.  Specifically, no specific features of cerebral anoxia or brain swelling are evident on this examination.   Electronically Signed   By: Davonna Belling M.D.   On: 04/13/2013 16:40   Ct Chest Wo Contrast  04/13/2013   CLINICAL DATA:  ARDS.  Infiltrates.  Abdominal pain.  EXAM: CT CHEST, ABDOMEN AND PELVIS WITHOUT CONTRAST  TECHNIQUE: Multidetector CT imaging of the chest, abdomen and pelvis was performed following the standard protocol without IV contrast.  COMPARISON:  None.  FINDINGS: CT CHEST FINDINGS  The chest wall is unremarkable. No breast masses, supraclavicular or axillary adenopathy. A left-sided pacemaker is noted. The bony thorax is intact. No destructive bone lesions or spinal canal compromise.  The heart is mildly enlarged but no pericardial effusion. The aorta is normal in caliber. Atherosclerotic calcifications are noted. No mediastinal or hilar  mass or adenopathy. Scattered lymph nodes are noted. The esophagus is grossly normal. There is a NG tube coursing down into the stomach. A left-sided chest tube is noted.  Examination of the lung parenchyma demonstrates diffuse patchy airspace disease likely edema or ARDS. There is a moderate-sized right pleural effusion and bibasilar atelectasis. No pneumothorax is identified.  CT ABDOMEN AND PELVIS FINDINGS  The liver is grossly normal without contrast. No obvious lesion or biliary dilatation. The gallbladder is filled with high attenuation material which could be vicarious excretion related to a prior contrast-enhanced study or milk of calcium. No common bile duct dilatation. The pancreas is grossly normal. The spleen is normal in size. There is a left adrenal gland lesion which is an indeterminate finding measuring 18 Hounsfield units. No findings for hydronephrosis or lobe both ureters are slightly prominent. This could be due to a distended bladder despite a Foley catheter.  The stomach, duodenum, small bowel and colon are grossly normal without oral contrast. No obvious inflammatory changes, mass lesions or obstructive findings. The uterus is surgically absent. Both ovaries are still present  are grossly normal. Moderate atherosclerotic changes involving the aorta but no focal aneurysm. No mesenteric or retroperitoneal mass.  There is extensive air in the subcutaneous tissues of the anterior abdominal wall along with some air in the left rectus muscle. The right lower chest wall and upper right abdominal wall demonstrate large amount of fluid in inflammation. There is also a small amount of free intraperitoneal air. These findings could be due to a prior pneumothorax with dissection of air. Necrotizing fasciitis is also a remote possibility.  The bony structures are unremarkable. There is advanced degenerative disc disease and facet disease in the lower lumbar spine.  IMPRESSION: 1. Extensive airspace disease in the lungs likely due to ARDS. 2. Moderate-sized right pleural effusion with overlying atelectasis. 3. Left-sided chest tube in place without definite pneumothorax. 4. Extensive air in the subcutaneous fat of the anterior abdominal wall along with marked inflammation and edema. This also scattered small areas of free intraperitoneal air. It is possible is dissecting down from a large pneumothorax. Has the patient had recent procedure? Necrotizing fasciitis is possible. 5. Distended bladder despite the Foley catheter. There is also mild hydroureter. 6. High attenuation material the gallbladder possibly due to vicarious excretion or milk of calcium   Electronically Signed   By: Loralie Champagne M.D.   On: 04/13/2013 17:31   Dg Chest Port 1 View  04/14/2013   CLINICAL DATA:  Intubated patient.  Acute respiratory failure.  EXAM: PORTABLE CHEST - 1 VIEW  COMPARISON:  Single view of the chest 04/13/2013 and 04/12/2013. CT chest 04/13/2013.  FINDINGS: Support tubes and lines are unchanged. Extensive bilateral airspace disease persists. Aeration in the left lung base has worsened since the most recent plain film. No pneumothorax identified. Small right pleural effusion is noted.  IMPRESSION: Extensive  bilateral airspace disease persists and show some worsening in the left base.   Electronically Signed   By: Drusilla Kanner M.D.   On: 04/14/2013 07:38   Dg Chest Port 1 View  04/13/2013   CLINICAL DATA:  Endotracheal tube position.  EXAM: PORTABLE CHEST - 1 VIEW  COMPARISON:  04/12/2013  FINDINGS: Endotracheal tube has tip 4.4 cm above the carina. Nasogastric tube unchanged. Right IJ central venous catheter unchanged. Left-sided chest tube unchanged. Left-sided pacemaker unchanged. Lungs are adequately inflated and demonstrate hazy bilateral perihilar opacification with slightly improved aeration. Mild left basilar opacification  unchanged. No evidence of pneumothorax. Possible small amount of left pleural fluid. Cardiomediastinal silhouette and remainder of the exam is unchanged.  IMPRESSION: Bilateral perihilar opacification slightly improved likely interstitial edema. Mild stable left basilar opacification. Cannot exclude infection. Possible small amount of left pleural fluid.  Tubes and lines as described.   Electronically Signed   By: Elberta Fortisaniel  Boyle M.D.   On: 04/13/2013 07:09      ASSESSMENT / PLAN:  PULMONARY A: Acute Respiratory Failure - B infiltrates > ddx = ARDS, viral pneumonitis, bacterial, consider amiodarone or other non-infectious inflammatory process.  Note she has had a similar hospitalization 2-3 yrs ago. Consider COP, acute amiodarone rxn.  Favor viral or non-infectious etiology. ARDS  Mucus plug - 2/8 with resp arrest Left pneumothorax s/p chest tube - post CPR P:   -continue PCV, adjust pressures to maintain Vt of 300-500 -Vent bundle -mucomyst & nebs -CT to 20 cm sxn -trend cxr -solumedrol 80 mg Q6  CARDIOVASCULAR A:  Septic and Hypovolemic Shock Cardiac arrest 2/8 am due to ETT obstruction Hx HTN Hx CHF / ICM s/p Pacemeker - compensated.  EF 55-60% 2/10 AFib on amiodarone P:  -hold oral amiodarone -hold zocor  RENAL A:   Acute Kidney Injury - in setting of  arrest P:   -trend BMP -replace lytes as indicated  -lasix 20mg  x1  GASTROINTESTINAL A:   Nausea / Vomiting / Diarrhea - resolved P:   -follow pending stool cultures (have not been sent / no sample) -PPI -zofran PRN  -TF per nutrition   HEMATOLOGIC A:   Leukocytosis DVT Prophylaxis  P:  -heparin for DVT proph -monitor cbc   INFECTIOUS A:   Gastroenteritis, Diarrhea - resolved. R/O CAP vs Viral Respiratory Illness - PCT elevated but trending down (44->27->14), favors infectious cause. -ceftriaxone + azithro completed 2/9 RVP neg - off tamiflu   Fever - noted off abx 2/10.   Yeast - in urine   P:   -stool O&P & PCR pending -re-culture & initiate broad spectrum abx 2/11 -Diflucan x1  ENDOCRINE A:   Hypothyroidism   Gout  - with acute flare P:   -continue synthroid   NEUROLOGIC A:   At Risk Anoxic Encephalopathy s/p CPR. -CT head neg 2/11 P:   -cont fent gtt, PRN versed -precedex -Serial exams -daily WUA    GLOBAL: -continue full support measures -ongoing discussions with family regarding tracheostomy  (Staff MD: family agreeable to trach if still not extubated by Monday 04/18/13 but they do not want to talk aboutCanary Brim(  Brandi Ollis, NP-C [facility] Pgr: [phone] or [phone] 04/14/2013, 8:47 AM    STAFF NOTE: I, Dr Lavinia SharpsM Damiel Barthold have personally reviewed patient's available data, including medical history, events of note, physical examination and test results as part of my evaluation. I have discussed with resident/NP and other care providers such as pharmacist, RN and RRT.  In addition,  I personally evaluated patient and elicited key findings of acute resp failuyre with ARDS due to ? amio tox. Started IV steroids.Haing copious resp secretions; d/w family this could be chronic. Will give flonase and q1h sucntioning (they are concerned otherwise mucus plug arrest can recur). They are full code. They are agreeable to trach if not extubated by  04/18/13  Rest per NP/medical resident whose note is outlined above and that I agree with  The patient is critically ill with multiple organ systems failure and requires high complexity decision making for assessment and support,  frequent evaluation and titration of therapies, application of advanced monitoring technologies and extensive interpretation of multiple databases.   Critical Care Time devoted to patient care services described in this note is  35  Minutes.  Dr. Kalman Shan, M.D., [facility].C.P Pulmonary and Critical Care Medicine Staff Physician [facility] [place] Pager: [phone], If no answer or between  15:00h - 7:00h: call [phone]  04/14/2013 12:08 PM

## 2013-04-14 NOTE — Progress Notes (Signed)
Follow up for continued support with family.  Family members discussing possibility of pt receiving trach.   Belva Crome MDiv

## 2013-04-14 NOTE — Progress Notes (Signed)
NUTRITION FOLLOW UP  Intervention:   - Continue TF via OGT of Oxepa at 85ml/hr with Prostat 35ml TID - Will continue to monitor   Nutrition Dx:   Inadequate oral intake related to inability to eat as evidenced by NPO - ongoing   Goal:   1. Refeeding labs WNL - met  2. TF to meet >90% of estimated nutritional needs - met   Monitor:   Weights, labs, TF tolerance, vent status  Assessment:   Pt started having vomiting 04/03/13. She thought she had food poisoning and continued to vomiting throughout the day. She vomited at least a dozen times. She stopped vomiting around 10 am but then started having diarrhea, multiple episodes. As the day progressed she started feeling short of breath. She also developed chills and fever. Slight cough for the last few days. She did start to feel lightheaded. Hx of stage 3 CKD, HTN, hyperlipidemia, and hypothyroidism. Found to have acute hypoxemic respiratory failure, sepsis, pneumonia, and acute on chronic renal failure.   2/5 - Pt intubated yesterday for acute respiratory distress and placed on ARDS protocol. Pt alone in room, wearing mittens. Suspect pt with inadequate oral intake since 2/1 due to vomiting/diarrhea.   2/6 - Remains intubated. Started on TF via OGT of Oxepa at 20ml/hr. Met with RN who reports pt tolerating TF well with only 56ml residuals. Phosphorus low, potassium and magnesium WNL. Discussed with pharmacist. Pt getting sodium phosphate IV replacement today, expect phosphorus will improve with replacement. Pt's weight up 20 pounds since admission, getting Lasix today.   2/9 - Reviewed events since last RD note. Pt had code blue 2/8, CPR performed, pt with improved oxygenation. Pt extubated and reintubated 2/8. Had chest tube inserted 2/8 for pneumothorax. Remains intubated. Phosphorus and magnesium WNL, potassium remains low - getting liquid replacement. Per conversation with RN, pt tolerating TF well with no residuals. No plans for extubation  today. Pt's weight up 21 pounds since admission.   2/12 - Palliative care following, met with pt yesterday - noted plans for full code and "full force ahead". Per conversation with RN, pt with only 69ml TF residuals this morning and has been tolerating TF well. Remains intubated. Pt's weight up 31 pounds since admission. Is getting Lasix. Refeeding labs WNL.   Patient is currently intubated on ventilator support.  MV: 11.4 L/min Temp (24hrs), Avg:100.9 F (38.3 C), Min:99.9 F (37.7 C), Max:101.8 F (38.8 C)  Propofol: off    TF: Oxepa to 17ml/hr via OGT with Prostat 58ml TID which provides 1740 calories, 105g protein, 78ml free water and meets 97% estimated calorie needs and 100% estimated protein needs.    Potassium  Date/Time Value Ref Range Status  04/14/2013  4:00 AM 5.0  3.7 - 5.3 mEq/L Final  04/13/2013  4:47 AM 5.5* 3.7 - 5.3 mEq/L Final  04/12/2013  4:09 AM 5.0  3.7 - 5.3 mEq/L Final     DELTA CHECK NOTED     REPEATED TO VERIFY     NO VISIBLE HEMOLYSIS    Phosphorus  Date/Time Value Ref Range Status  04/13/2013  4:47 AM 3.0  2.3 - 4.6 mg/dL Final  04/12/2013  4:09 AM 2.4  2.3 - 4.6 mg/dL Final  04/09/2013  4:00 AM 2.7  2.3 - 4.6 mg/dL Final    Magnesium  Date/Time Value Ref Range Status  04/13/2013  4:47 AM 2.2  1.5 - 2.5 mg/dL Final  04/12/2013  4:09 AM 2.5  1.5 - 2.5 mg/dL Final  04/09/2013  4:00 AM 2.1  1.5 - 2.5 mg/dL Final     Height: Ht Readings from Last 1 Encounters:  04/11/13 $RemoveB'5\' 3"'pePhxjnK$  (1.6 m)    Weight Status:   Wt Readings from Last 1 Encounters:  04/14/13 198 lb 3.1 oz (89.9 kg)  Admit wt:        167 lb 5.3 oz (75.9 kg)  Net I/Os: +7.7L  Re-estimated needs:  Kcal: 1786 Protein: 80-105g Fluid: >1.6L/day   Skin: +2 generalized edema, +2 RUE, LUE edema, +3 RLE, LLE edema   Diet Order: NPO   Intake/Output Summary (Last 24 hours) at 04/14/13 1148 Last data filed at 04/14/13 1100  Gross per 24 hour  Intake 2779.4 ml  Output   2035 ml  Net  744.4 ml     Last BM: 2/8   Labs:   Recent Labs Lab 04/09/13 0400  04/12/13 0409 04/13/13 0447 04/14/13 0400  NA 142  < > 148* 149* 147  K 3.4*  < > 5.0 5.5* 5.0  CL 107  < > 112 113* 111  CO2 26  < > $R'30 29 29  'Fh$ BUN 32*  < > 33* 42* 35*  CREATININE 1.27*  < > 1.05 1.22* 1.03  CALCIUM 7.1*  < > 7.7* 7.8* 7.8*  MG 2.1  --  2.5 2.2  --   PHOS 2.7  --  2.4 3.0  --   GLUCOSE 144*  < > 188* 209* 190*  < > = values in this interval not displayed.  CBG (last 3)   Recent Labs  04/14/13 0035 04/14/13 0322 04/14/13 0805  GLUCAP 149* 197* 170*    Scheduled Meds: . acetylcysteine  2 mL Nebulization BID  . antiseptic oral rinse  15 mL Mouth Rinse QID  . aspirin  325 mg Per Tube Daily  . chlorhexidine  15 mL Mouth Rinse BID  . clindamycin (CLEOCIN) IV  600 mg Intravenous 3 times per day  . feeding supplement (OXEPA)  1,000 mL Per Tube Q24H  . feeding supplement (PRO-STAT SUGAR FREE 64)  30 mL Per Tube TID  . heparin  5,000 Units Subcutaneous 3 times per day  . insulin aspart  0-15 Units Subcutaneous 6 times per day  . ipratropium-albuterol  3 mL Nebulization Q6H  . levothyroxine  75 mcg Oral QAC breakfast  . methylPREDNISolone (SOLU-MEDROL) injection  80 mg Intravenous Q6H  . multivitamin  5 mL Per Tube Daily  . pantoprazole sodium  40 mg Per Tube Q1200  . piperacillin-tazobactam (ZOSYN)  IV  3.375 g Intravenous Q8H  . sodium chloride  500 mL Intravenous Once  . vancomycin  750 mg Intravenous Q12H    Continuous Infusions: . sodium chloride 20 mL/hr at 04/13/13 1800  . dexmedetomidine 1.2 mcg/kg/hr (04/14/13 0945)  . fentaNYL infusion INTRAVENOUS 300 mcg/hr (04/14/13 0900)  . norepinephrine (LEVOPHED) Adult infusion Stopped (04/14/13 0800)    Mikey College MS, RD, [place] Pager [phone] After Hours Pager

## 2013-04-15 ENCOUNTER — Inpatient Hospital Stay (HOSPITAL_COMMUNITY): Payer: Medicare Other

## 2013-04-15 LAB — VANCOMYCIN, TROUGH: Vancomycin Tr: 14.5 ug/mL (ref 10.0–20.0)

## 2013-04-15 LAB — BASIC METABOLIC PANEL
BUN: 39 mg/dL — ABNORMAL HIGH (ref 6–23)
CALCIUM: 8 mg/dL — AB (ref 8.4–10.5)
CO2: 27 mEq/L (ref 19–32)
CREATININE: 0.96 mg/dL (ref 0.50–1.10)
Chloride: 108 mEq/L (ref 96–112)
GFR calc Af Amer: 64 mL/min — ABNORMAL LOW (ref >90)
GFR, EST NON AFRICAN AMERICAN: 55 mL/min — AB (ref >90)
Glucose, Bld: 263 mg/dL — ABNORMAL HIGH (ref 70–99)
Potassium: 4.3 mEq/L (ref 3.7–5.3)
Sodium: 146 mEq/L (ref 137–147)

## 2013-04-15 LAB — GLUCOSE, CAPILLARY
GLUCOSE-CAPILLARY: 181 mg/dL — AB (ref 70–99)
GLUCOSE-CAPILLARY: 221 mg/dL — AB (ref 70–99)
GLUCOSE-CAPILLARY: 222 mg/dL — AB (ref 70–99)
GLUCOSE-CAPILLARY: 250 mg/dL — AB (ref 70–99)
Glucose-Capillary: 221 mg/dL — ABNORMAL HIGH (ref 70–99)
Glucose-Capillary: 217 mg/dL — ABNORMAL HIGH (ref 70–99)

## 2013-04-15 LAB — CBC
HCT: 32.3 % — ABNORMAL LOW (ref 36.0–46.0)
Hemoglobin: 10 g/dL — ABNORMAL LOW (ref 12.0–15.0)
MCH: 29.8 pg (ref 26.0–34.0)
MCHC: 31 g/dL (ref 30.0–36.0)
MCV: 96.1 fL (ref 78.0–100.0)
PLATELETS: UNDETERMINED 10*3/uL (ref 150–400)
RBC: 3.36 MIL/uL — ABNORMAL LOW (ref 3.87–5.11)
RDW: 15.7 % — AB (ref 11.5–15.5)
WBC: 24.2 10*3/uL — AB (ref 4.0–10.5)

## 2013-04-15 LAB — SJOGRENS SYNDROME-B EXTRACTABLE NUCLEAR ANTIBODY: SSB (LA) (ENA) ANTIBODY, IGG: NEGATIVE

## 2013-04-15 LAB — CULTURE, RESPIRATORY W GRAM STAIN

## 2013-04-15 LAB — CULTURE, RESPIRATORY

## 2013-04-15 LAB — ANTI-NUCLEAR AB-TITER (ANA TITER): ANA Titer 1: 1:80 {titer} — ABNORMAL HIGH

## 2013-04-15 LAB — ANA: Anti Nuclear Antibody(ANA): POSITIVE — AB

## 2013-04-15 LAB — PRO B NATRIURETIC PEPTIDE: Pro B Natriuretic peptide (BNP): 2271 pg/mL — ABNORMAL HIGH (ref 0–450)

## 2013-04-15 LAB — PROCALCITONIN: PROCALCITONIN: 2.61 ng/mL

## 2013-04-15 LAB — SJOGRENS SYNDROME-A EXTRACTABLE NUCLEAR ANTIBODY: SSA (Ro) (ENA) Antibody, IgG: 5.3

## 2013-04-15 LAB — PHOSPHORUS: Phosphorus: 3.9 mg/dL (ref 2.3–4.6)

## 2013-04-15 LAB — MAGNESIUM: MAGNESIUM: 2.4 mg/dL (ref 1.5–2.5)

## 2013-04-15 MED ORDER — FLUCONAZOLE IN SODIUM CHLORIDE 200-0.9 MG/100ML-% IV SOLN
200.0000 mg | INTRAVENOUS | Status: DC
  Administered 2013-04-15: 200 mg via INTRAVENOUS
  Filled 2013-04-15 (×2): qty 100

## 2013-04-15 MED ORDER — FUROSEMIDE 10 MG/ML IJ SOLN
40.0000 mg | Freq: Two times a day (BID) | INTRAMUSCULAR | Status: DC
  Administered 2013-04-15 – 2013-04-17 (×5): 40 mg via INTRAVENOUS
  Filled 2013-04-15 (×7): qty 4

## 2013-04-15 MED ORDER — FUROSEMIDE 10 MG/ML IJ SOLN
40.0000 mg | Freq: Once | INTRAMUSCULAR | Status: AC
  Administered 2013-04-15: 40 mg via INTRAVENOUS
  Filled 2013-04-15: qty 4

## 2013-04-15 MED ORDER — FLUCONAZOLE IN SODIUM CHLORIDE 200-0.9 MG/100ML-% IV SOLN: 200.0000 mg | INTRAVENOUS | Status: DC

## 2013-04-15 MED ORDER — POTASSIUM CHLORIDE 20 MEQ/15ML (10%) PO LIQD
20.0000 meq | Freq: Once | ORAL | Status: AC
  Administered 2013-04-15: 20 meq
  Filled 2013-04-15: qty 15

## 2013-04-15 NOTE — Progress Notes (Signed)
Wasted 30cc Fentanyl with Molli Barrows.

## 2013-04-15 NOTE — Progress Notes (Signed)
Name: [name] MRN: [id number] DOB: January 22, 1933   LOS 11 days   ADMISSION DATE:  04/04/2013 CONSULTATION DATE:  04/05/13  REFERRING MD :  Dr. Vanessa Barbara  PRIMARY SERVICE: TRH-->PCCM  CHIEF COMPLAINT:  Hypotension   BRIEF PATIENT DESCRIPTION: 80 y/o F admitted 2/2 with respiratory failure, hypovolemic and presumed septic shock -souuce unclear.  Course complicated by ARDS.  2/8 am suffered mucus plugging with ETT obstruction with subsequent cardiac arrest.  Required CPR x 9 mins, improved with re-intubation & left pneumothorax decompression  SIGNIFICANT EVENTS / STUDIES:  2/03 - Admit with fever, SOB, vomiting, diarrhea (concern for food poisoning) & CXR concerning for interstitial pneumonitis 2/03 - 2 d echo>>>Ef 45% grade 1 diastolic dysfunction  2/08 - resp arrest with mucus plugging, ETT exchanged 2/09 - changed to PCV overnight 2/10 - febrile overnight, hypotension requiring pressors 2/11 - CT Chest/ABD/Pelvis>>ARDS, R pleural effusion, L CT, Air in sq of anterior abd with marked inflammation 2/11 - CT Head>>neg  LINES / TUBES: L IJ TLC 2/3>>> 2/4 OTT>>>2/8, 2/8>>> 2/4 a line>> 2/4 OGT>>  CULTURES: MRSA PCR 2/3>>> negative BCx2 2/3>>>neg Sputum 2/3>>> normal flora UA 2/3>>> negative nit and LE Resp Viral Panel 2/3>>>negative 2/4 tracheal aspirate>>normal flora Stool culture, o&p 2/5>>> Stool PCR 04/11/13>> .................. BCx2 2/11>>> Sputum 2/11>>>neg UA 2/11>>>many yeast UC 2/11>>>greater 100k yeast PCT 2/11>>>4.31 (down from 44) Lactic Acid 2/11>>>1.6  ANTIBIOTICS: Rocephin 2/3>>>2/9 Azithro 2/3>>>2/9 Tamiflu 2/3>>>2/4 Clinda 2/12>>> Vanco 2/12>>> Zosyn 2/12>>> Diflucan 2/12>>>   SUBJECTIVE:  No acute events.  Yeast noted in UC   VITAL SIGNS: Temp:  [98.8 F (37.1 C)-102 F (38.9 C)] 99.3 F (37.4 C) (02/13 0800) Pulse Rate:  [70-89] 78 (02/13 0800) Resp:  [11-23] 15 (02/13 0800) BP: (97-150)/(43-53) 145/45 mmHg (02/13 0800) SpO2:  [91 %-96 %]  91 % (02/13 0800) Arterial Line BP: (119-175)/(47-69) 175/69 mmHg (02/13 0800) FiO2 (%):  [60 %] 60 % (02/13 0801) Weight:  [202 lb 2.6 oz (91.7 kg)] 202 lb 2.6 oz (91.7 kg) (02/13 0110)  HEMODYNAMICS: CVP:  [13 mmHg-19 mmHg] 13 mmHg  VENTILATOR SETTINGS: Vent Mode:  [-] PCV FiO2 (%):  [60 %] 60 % Set Rate:  [14 bmp] 14 bmp PEEP:  [10 cmH20] 10 cmH20 Plateau Pressure:  [17 cmH20-24 cmH20] 18 cmH20  INTAKE / OUTPUT: Intake/Output     02/12 0701 - 02/13 0700 02/13 0701 - 02/14 0700   I.V. (mL/kg) 1753.5 (19.1) 101.2 (1.1)   NG/GT 1020 40   IV Piggyback 700    Total Intake(mL/kg) 3473.5 (37.9) 141.2 (1.5)   Urine (mL/kg/hr) 1875 (0.9) 65 (0.4)   Chest Tube 125 (0.1)    Total Output 2000 65   Net +1473.5 +76.2          PHYSICAL EXAMINATION: General:  wdwn elderly female in NAD on vent Neuro:  Sedated on vent, RASS -1, intermittent agitation, alert at times, nods appropriately  HEENT:  Mm pink/dry, no jvd, OETT Cardiovascular:  s1s2 rrr, paced on monitor Lungs:  resps even non labored on vent, diminished bilaterally, left chest tube - without air leak Abdomen:  Round/soft, bsx4 active Musculoskeletal:  No acute deformities  Skin:  Warm/dry, generalized edema.  L great toe with erythema / warmth c/w gout flare  LABS:  PULMONARY  Recent Labs Lab 04/10/13 0828 04/10/13 1250 04/12/13 0940 04/13/13 1455 04/14/13 1121  PHART 7.374 7.424 7.416 7.445 7.405  PCO2ART 39.1 41.4 43.1 39.5 44.6  PO2ART 58.7* 65.6* 63.7* 67.2* 66.2*  HCO3 22.3 26.6*  27.2* 26.2* 26.8*  TCO2 20.0 23.8 24.2 23.5 23.7  O2SAT 87.9 92.8 91.2 91.9 90.0   CBC  Recent Labs Lab 04/13/13 0447 04/14/13 0400 04/15/13 0410  HGB 11.3* 10.1* 10.0*  HCT 36.0 32.1* 32.3*  WBC 34.6* 20.5* 24.2*  PLT PLATELET CLUMPS NOTED ON SMEAR, UNABLE TO ESTIMATE PLATELET CLUMPS NOTED ON SMEAR, UNABLE TO ESTIMATE PLATELET CLUMPS NOTED ON SMEAR, UNABLE TO ESTIMATE   CARDIAC  No results found for this basename:  TROPONINI,  in the last 168 hours  Recent Labs Lab 04/13/13 0447 04/15/13 0410  PROBNP 2694.0* 2271.0*   CHEMISTRY  Recent Labs Lab 04/09/13 0400  04/11/13 0305 04/12/13 0409 04/13/13 0447 04/14/13 0400 04/15/13 0410  NA 142  < > 144 148* 149* 147 146  K 3.4*  < > 3.1* 5.0 5.5* 5.0 4.3  CL 107  < > 107 112 113* 111 108  CO2 26  < > 28 30 29 29 27   GLUCOSE 144*  < > 170* 188* 209* 190* 263*  BUN 32*  < > 39* 33* 42* 35* 39*  CREATININE 1.27*  < > 1.20* 1.05 1.22* 1.03 0.96  CALCIUM 7.1*  < > 7.0* 7.7* 7.8* 7.8* 8.0*  MG 2.1  --   --  2.5 2.2  --  2.4  PHOS 2.7  --   --  2.4 3.0  --  3.9  < > = values in this interval not displayed. Estimated Creatinine Clearance: 51.9 ml/min (by C-G formula based on Cr of 0.96).  LIVER  Recent Labs Lab 04/09/13 0400  AST 19  ALT 49*  ALKPHOS 43  BILITOT <0.2*  PROT 5.0*  ALBUMIN 1.8*   INFECTIOUS  Recent Labs Lab 04/13/13 0448 04/13/13 0930 04/13/13 1800 04/14/13 0400 04/15/13 0410  LATICACIDVEN  --  2.0 1.6  --   --   PROCALCITON 5.82  --   --  4.31 2.61   ENDOCRINE CBG (last 3)   Recent Labs  04/15/13 0013 04/15/13 0322 04/15/13 0743  GLUCAP 181* 250* 221*   IMAGING x48h  Ct Abdomen Pelvis Wo Contrast  04/13/2013   CLINICAL DATA:  ARDS.  Infiltrates.  Abdominal pain.  EXAM: CT CHEST, ABDOMEN AND PELVIS WITHOUT CONTRAST  TECHNIQUE: Multidetector CT imaging of the chest, abdomen and pelvis was performed following the standard protocol without IV contrast.  COMPARISON:  None.  FINDINGS: CT CHEST FINDINGS  The chest wall is unremarkable. No breast masses, supraclavicular or axillary adenopathy. A left-sided pacemaker is noted. The bony thorax is intact. No destructive bone lesions or spinal canal compromise.  The heart is mildly enlarged but no pericardial effusion. The aorta is normal in caliber. Atherosclerotic calcifications are noted. No mediastinal or hilar mass or adenopathy. Scattered lymph nodes are noted. The  esophagus is grossly normal. There is a NG tube coursing down into the stomach. A left-sided chest tube is noted.  Examination of the lung parenchyma demonstrates diffuse patchy airspace disease likely edema or ARDS. There is a moderate-sized right pleural effusion and bibasilar atelectasis. No pneumothorax is identified.  CT ABDOMEN AND PELVIS FINDINGS  The liver is grossly normal without contrast. No obvious lesion or biliary dilatation. The gallbladder is filled with high attenuation material which could be vicarious excretion related to a prior contrast-enhanced study or milk of calcium. No common bile duct dilatation. The pancreas is grossly normal. The spleen is normal in size. There is a left adrenal gland lesion which is an indeterminate finding measuring 18 Hounsfield  units. No findings for hydronephrosis or lobe both ureters are slightly prominent. This could be due to a distended bladder despite a Foley catheter.  The stomach, duodenum, small bowel and colon are grossly normal without oral contrast. No obvious inflammatory changes, mass lesions or obstructive findings. The uterus is surgically absent. Both ovaries are still present are grossly normal. Moderate atherosclerotic changes involving the aorta but no focal aneurysm. No mesenteric or retroperitoneal mass.  There is extensive air in the subcutaneous tissues of the anterior abdominal wall along with some air in the left rectus muscle. The right lower chest wall and upper right abdominal wall demonstrate large amount of fluid in inflammation. There is also a small amount of free intraperitoneal air. These findings could be due to a prior pneumothorax with dissection of air. Necrotizing fasciitis is also a remote possibility.  The bony structures are unremarkable. There is advanced degenerative disc disease and facet disease in the lower lumbar spine.  IMPRESSION: 1. Extensive airspace disease in the lungs likely due to ARDS. 2. Moderate-sized right  pleural effusion with overlying atelectasis. 3. Left-sided chest tube in place without definite pneumothorax. 4. Extensive air in the subcutaneous fat of the anterior abdominal wall along with marked inflammation and edema. This also scattered small areas of free intraperitoneal air. It is possible is dissecting down from a large pneumothorax. Has the patient had recent procedure? Necrotizing fasciitis is possible. 5. Distended bladder despite the Foley catheter. There is also mild hydroureter. 6. High attenuation material the gallbladder possibly due to vicarious excretion or milk of calcium   Electronically Signed   By: Loralie ChampagneMark  Gallerani M.D.   On: 04/13/2013 17:31   Ct Head Wo Contrast  04/13/2013   CLINICAL DATA:  Status post cardiac arrest. Altered level of consciousness. Not following commands.  EXAM: CT HEAD WITHOUT CONTRAST  TECHNIQUE: Contiguous axial images were obtained from the base of the skull through the vertex without contrast.  COMPARISON:  CT head 03/15/2010.  FINDINGS: No evidence for acute infarction, hemorrhage, mass lesion, hydrocephalus, or extra-axial fluid. Normal for age cerebral volume. No significant white matter disease. Calvarium intact. Vascular calcification of the carotid siphon regions. Large retention cyst left maxillary sinus. Dependent fluid both mastoids and left middle ear cavity, nonspecific.  There are no specific features which suggest hypoxic-ischemic insult. Gray-white junction is preserved in the supratentorial region. There is no diffuse brain swelling or obliteration of the basilar cisterns. There is no evidence for large vessel infarct. Continued surveillance is warranted. If clinically indicated, MRI can be more sensitive in the detection of anoxic injury. Similar appearance to 2012.  IMPRESSION: Normal for age cerebral volume.  No acute intracranial findings.  Specifically, no specific features of cerebral anoxia or brain swelling are evident on this examination.    Electronically Signed   By: Davonna BellingJohn  Curnes M.D.   On: 04/13/2013 16:40   Ct Chest Wo Contrast  04/13/2013   CLINICAL DATA:  ARDS.  Infiltrates.  Abdominal pain.  EXAM: CT CHEST, ABDOMEN AND PELVIS WITHOUT CONTRAST  TECHNIQUE: Multidetector CT imaging of the chest, abdomen and pelvis was performed following the standard protocol without IV contrast.  COMPARISON:  None.  FINDINGS: CT CHEST FINDINGS  The chest wall is unremarkable. No breast masses, supraclavicular or axillary adenopathy. A left-sided pacemaker is noted. The bony thorax is intact. No destructive bone lesions or spinal canal compromise.  The heart is mildly enlarged but no pericardial effusion. The aorta is normal in caliber. Atherosclerotic calcifications  are noted. No mediastinal or hilar mass or adenopathy. Scattered lymph nodes are noted. The esophagus is grossly normal. There is a NG tube coursing down into the stomach. A left-sided chest tube is noted.  Examination of the lung parenchyma demonstrates diffuse patchy airspace disease likely edema or ARDS. There is a moderate-sized right pleural effusion and bibasilar atelectasis. No pneumothorax is identified.  CT ABDOMEN AND PELVIS FINDINGS  The liver is grossly normal without contrast. No obvious lesion or biliary dilatation. The gallbladder is filled with high attenuation material which could be vicarious excretion related to a prior contrast-enhanced study or milk of calcium. No common bile duct dilatation. The pancreas is grossly normal. The spleen is normal in size. There is a left adrenal gland lesion which is an indeterminate finding measuring 18 Hounsfield units. No findings for hydronephrosis or lobe both ureters are slightly prominent. This could be due to a distended bladder despite a Foley catheter.  The stomach, duodenum, small bowel and colon are grossly normal without oral contrast. No obvious inflammatory changes, mass lesions or obstructive findings. The uterus is surgically  absent. Both ovaries are still present are grossly normal. Moderate atherosclerotic changes involving the aorta but no focal aneurysm. No mesenteric or retroperitoneal mass.  There is extensive air in the subcutaneous tissues of the anterior abdominal wall along with some air in the left rectus muscle. The right lower chest wall and upper right abdominal wall demonstrate large amount of fluid in inflammation. There is also a small amount of free intraperitoneal air. These findings could be due to a prior pneumothorax with dissection of air. Necrotizing fasciitis is also a remote possibility.  The bony structures are unremarkable. There is advanced degenerative disc disease and facet disease in the lower lumbar spine.  IMPRESSION: 1. Extensive airspace disease in the lungs likely due to ARDS. 2. Moderate-sized right pleural effusion with overlying atelectasis. 3. Left-sided chest tube in place without definite pneumothorax. 4. Extensive air in the subcutaneous fat of the anterior abdominal wall along with marked inflammation and edema. This also scattered small areas of free intraperitoneal air. It is possible is dissecting down from a large pneumothorax. Has the patient had recent procedure? Necrotizing fasciitis is possible. 5. Distended bladder despite the Foley catheter. There is also mild hydroureter. 6. High attenuation material the gallbladder possibly due to vicarious excretion or milk of calcium   Electronically Signed   By: Loralie Champagne M.D.   On: 04/13/2013 17:31   Dg Chest Port 1 View  04/15/2013   CLINICAL DATA:  Bilateral pulmonary infiltrates.  EXAM: PORTABLE CHEST - 1 VIEW  COMPARISON:  04/14/2013  FINDINGS: Endotracheal tube, NG tube, central venous catheter, and pigtail catheter all appear in good position, unchanged. Triple lead pacer in place.  No pneumothorax. Pulmonary infiltrates appears slightly worse in the right upper lobe and in the left mid and lower lung zones. No discrete  effusions.  IMPRESSION: Slight worsening of the bilateral pulmonary infiltrates.   Electronically Signed   By: Geanie Cooley M.D.   On: 04/15/2013 07:11   Dg Chest Port 1 View  04/14/2013   CLINICAL DATA:  ET tube advancement.  EXAM: PORTABLE CHEST - 1 VIEW  COMPARISON:  04/14/2013 at 5:07 a.m.  FINDINGS: Endotracheal tube tip now projects 17 mm above the chronic, well positioned.  Bilateral airspace lung opacity is stable. Left pigtail catheter, right internal jugular central venous line, left anterior chest wall sequential pacemaker and nasogastric tube are stable and well  positioned.  No pneumothorax.  IMPRESSION: Endotracheal tube tip now lies 1.7 cm above the chronic, well positioned. No other change.   Electronically Signed   By: Amie Portland M.D.   On: 04/14/2013 14:34   Dg Chest Port 1 View  04/14/2013   CLINICAL DATA:  Intubated patient.  Acute respiratory failure.  EXAM: PORTABLE CHEST - 1 VIEW  COMPARISON:  Single view of the chest 04/13/2013 and 04/12/2013. CT chest 04/13/2013.  FINDINGS: Support tubes and lines are unchanged. Extensive bilateral airspace disease persists. Aeration in the left lung base has worsened since the most recent plain film. No pneumothorax identified. Small right pleural effusion is noted.  IMPRESSION: Extensive bilateral airspace disease persists and show some worsening in the left base.   Electronically Signed   By: Drusilla Kanner M.D.   On: 04/14/2013 07:38      ASSESSMENT / PLAN:  PULMONARY A: Acute Respiratory Failure - B infiltrates > ddx = ARDS, viral pneumonitis, bacterial, consider amiodarone or other non-infectious inflammatory process.  Note she has had a similar hospitalization 2-3 yrs ago (1/12). Consider COP, acute amiodarone rxn.  Favor viral or non-infectious etiology. ARDS  Mucus plug - 2/8 with resp arrest Left pneumothorax s/p chest tube - post CPR P:   -continue PCV, adjust pressures to maintain Vt of 300-500 -Vent bundle -mucomyst &  nebs -CT to 20 cm sxn -trend cxr -solumedrol 80 mg Q6  CARDIOVASCULAR A:  Septic and Hypovolemic Shock - transiently required pressors.   Cardiac arrest 2/8 am due to ETT obstruction Hx HTN Hx CHF / ICM s/p Pacemeker - compensated.  EF 55-60% 2/10 AFib on amiodarone P:  -hold oral amiodarone -hold zocor -monitor hemodynamics  RENAL A:   Acute Kidney Injury - in setting of arrest.   P:   -trend BMP -replace lytes as indicated  -repeat lasix 2/14  GASTROINTESTINAL A:   Nausea / Vomiting / Diarrhea - resolved P:   -follow pending stool cultures (have not been sent / no sample) -PPI -zofran PRN  -TF per nutrition   HEMATOLOGIC A:   Leukocytosis - steroids initiated 2/12 DVT Prophylaxis  P:  -heparin for DVT proph -monitor cbc   INFECTIOUS A:   Gastroenteritis, Diarrhea - resolved. R/O CAP vs Viral Respiratory Illness - PCT elevated but trending down (44->27->14), favors infectious cause. -ceftriaxone + azithro completed 2/9 RVP neg - off tamiflu   Fever - noted off abx 2/10.   Yeast UTI   P:   -stool O&P & PCR pending -re-culture & initiate broad spectrum abx 2/11 -Diflucan x 3 days  ENDOCRINE A:   Hypothyroidism   Gout  - with acute flare P:   -continue synthroid   NEUROLOGIC A:   At Risk Anoxic Encephalopathy s/p CPR - CT head neg 2/11 P:   -cont fent gtt, PRN versed -precedex -Serial exams -daily WUA    GLOBAL: -continue full support measures -ongoing discussions with family regarding tracheostomy  Family agreeable to trach if still not extubated by Monday 04/18/13 but they do not want to talk about  Canary Brim, NP-C [facility] Pgr: [phone] or [phone] 04/15/2013, 8:45 AM    STAFF NOTE: I, Dr Lavinia Sharps have personally reviewed patient's available data, including medical history, events of note, physical examination and test results as part of my evaluation. I have discussed with resident/NP and other care  providers such as pharmacist, RN and RRT.  In addition,  I personally evaluated patient and  elicited key findings of acute resp ffailure.ARDS ? amio tox with negative autoimmune. Wil lget fio2 down to 50%. Still on peep 10. Husband very keen for fast recovery. I have again explained that the likely road is LTAC and [facility] and we will address this again. They are appreciative.  Rest per NP/medical resident whose note is outlined above and that I agree with  The patient is critically ill with multiple organ systems failure and requires high complexity decision making for assessment and support, frequent evaluation and titration of therapies, application of advanced monitoring technologies and extensive interpretation of multiple databases.   Critical Care Time devoted to patient care services described in this note is  35  Minutes.  Dr. Kalman Shan, M.D., [facility].C.P Pulmonary and Critical Care Medicine Staff Physician [facility] Pager: [phone], If no answer or between  15:00h - 7:00h: call [phone]  04/15/2013 11:50 AM

## 2013-04-15 NOTE — Progress Notes (Signed)
ANTIBIOTIC CONSULT NOTE  Pharmacy Consult for antibiotic renal dose adjustment (Vanc, Zosyn, Clinda) Indication: rule out sepsis, cellulitis  Labs:  Recent Labs  04/13/13 0447 04/14/13 0400 04/15/13 0410  WBC 34.6* 20.5* 24.2*  HGB 11.3* 10.1* 10.0*  PLT PLATELET CLUMPS NOTED ON SMEAR, UNABLE TO ESTIMATE PLATELET CLUMPS NOTED ON SMEAR, UNABLE TO ESTIMATE PLATELET CLUMPS NOTED ON SMEAR, UNABLE TO ESTIMATE  CREATININE 1.22* 1.03 0.96   Estimated Creatinine Clearance: 51.9 ml/min (by C-G formula based on Cr of 0.96).  Assessment:  69 yoF admitted with respiratory failure and probable CAP.  Completed 7 days of azithromycin and Ceftriaxone on 2/8.  On 2/8, suffered mucus plugging with ETT obstruction with subsequent cardiac arrest.  Pt become febrile off abx.  Vancomycin and Zosyn started 2/11 for broad spectrum coverage of possible sepsis and clindamycin for cellulitis.   Antibiotics:   2/2 >>zmax >> 2/8 2/2 >>rocephin >> 2/8 2/2>>tamiflu >>2/4 2/11 >> clinda >> 2/11 >> vanc >> 2/11 >> zosyn >> 2/12 >> Diflucan (yeast in urine) x3 >> (2/14)  Tmax: 102 WBCs: 24.2, increased CKD III: SCr 0.96, stable, CrCl ~ 54N, ~52CG PCT: 44 > 27 > 13 (2/5)  Recultured 2/11: Urine >100K yeast Blood x2: NGTD  Vancomycin trough today = 14.5 (goal 15-20).  Pt will likely accumulate with subsequent doses, level ok.  No change to vancomycin dose.   Goal of Therapy:  Vancomycin trough level 15-20 mcg/ml Appropriate abx dosing, eradication of infection.   Plan:   Clindamycin 600mg  IV Q8h   Zosyn 3.375g IV q8h (4 hour infusion time).  Continue Vancomycin 750 mg IV q12h.  Haynes Hoehn, PharmD, BCPS 04/15/2013, 1:26 PM  Pager: [phone]

## 2013-04-16 ENCOUNTER — Inpatient Hospital Stay (HOSPITAL_COMMUNITY): Payer: Medicare Other

## 2013-04-16 DIAGNOSIS — T797XXA Traumatic subcutaneous emphysema, initial encounter: Secondary | ICD-10-CM

## 2013-04-16 LAB — CBC
HCT: 32.5 % — ABNORMAL LOW (ref 36.0–46.0)
HEMATOCRIT: 31.4 % — AB (ref 36.0–46.0)
HEMOGLOBIN: 10.4 g/dL — AB (ref 12.0–15.0)
HEMOGLOBIN: 10.6 g/dL — AB (ref 12.0–15.0)
MCH: 31.2 pg (ref 26.0–34.0)
MCH: 30.5 pg (ref 26.0–34.0)
MCHC: 33.1 g/dL (ref 30.0–36.0)
MCHC: 32.6 g/dL (ref 30.0–36.0)
MCV: 94.3 fL (ref 78.0–100.0)
MCV: 93.4 fL (ref 78.0–100.0)
Platelets: UNDETERMINED 10*3/uL (ref 150–400)
Platelets: 123 10*3/uL — ABNORMAL LOW (ref 150–400)
RBC: 3.33 MIL/uL — AB (ref 3.87–5.11)
RBC: 3.48 MIL/uL — ABNORMAL LOW (ref 3.87–5.11)
RDW: 15.7 % — ABNORMAL HIGH (ref 11.5–15.5)
RDW: 15.7 % — ABNORMAL HIGH (ref 11.5–15.5)
WBC: 33.3 10*3/uL — ABNORMAL HIGH (ref 4.0–10.5)
WBC: 37.6 10*3/uL — AB (ref 4.0–10.5)

## 2013-04-16 LAB — BASIC METABOLIC PANEL
BUN: 53 mg/dL — AB (ref 6–23)
CHLORIDE: 104 meq/L (ref 96–112)
CO2: 28 mEq/L (ref 19–32)
Calcium: 8.1 mg/dL — ABNORMAL LOW (ref 8.4–10.5)
Creatinine, Ser: 1.2 mg/dL — ABNORMAL HIGH (ref 0.50–1.10)
GFR, EST AFRICAN AMERICAN: 49 mL/min — AB (ref >90)
GFR, EST NON AFRICAN AMERICAN: 42 mL/min — AB (ref >90)
Glucose, Bld: 242 mg/dL — ABNORMAL HIGH (ref 70–99)
POTASSIUM: 3.3 meq/L — AB (ref 3.7–5.3)
SODIUM: 145 meq/L (ref 137–147)

## 2013-04-16 LAB — GLUCOSE, CAPILLARY
GLUCOSE-CAPILLARY: 237 mg/dL — AB (ref 70–99)
GLUCOSE-CAPILLARY: 240 mg/dL — AB (ref 70–99)
GLUCOSE-CAPILLARY: 267 mg/dL — AB (ref 70–99)
Glucose-Capillary: 202 mg/dL — ABNORMAL HIGH (ref 70–99)
Glucose-Capillary: 245 mg/dL — ABNORMAL HIGH (ref 70–99)
Glucose-Capillary: 329 mg/dL — ABNORMAL HIGH (ref 70–99)

## 2013-04-16 LAB — HEPATIC FUNCTION PANEL
ALBUMIN: 1.6 g/dL — AB (ref 3.5–5.2)
ALT: 22 U/L (ref 0–35)
AST: 20 U/L (ref 0–37)
Alkaline Phosphatase: 53 U/L (ref 39–117)
Bilirubin, Direct: <0.2 mg/dL (ref 0.0–0.3)
Total Bilirubin: 0.2 mg/dL — ABNORMAL LOW (ref 0.3–1.2)
Total Protein: 5.6 g/dL — ABNORMAL LOW (ref 6.0–8.3)

## 2013-04-16 LAB — CK TOTAL AND CKMB (NOT AT ARMC)
CK, MB: 1.3 ng/mL (ref 0.3–4.0)
RELATIVE INDEX: INVALID (ref 0.0–2.5)
Total CK: 34 U/L (ref 7–177)

## 2013-04-16 LAB — PRO B NATRIURETIC PEPTIDE: Pro B Natriuretic peptide (BNP): 3345 pg/mL — ABNORMAL HIGH (ref 0–450)

## 2013-04-16 LAB — AMYLASE: Amylase: 50 U/L (ref 0–105)

## 2013-04-16 LAB — MAGNESIUM: Magnesium: 2.4 mg/dL (ref 1.5–2.5)

## 2013-04-16 LAB — TROPONIN I: Troponin I: <0.3 ng/mL (ref <0.30)

## 2013-04-16 LAB — PHOSPHORUS: Phosphorus: 4.2 mg/dL (ref 2.3–4.6)

## 2013-04-16 LAB — LACTIC ACID, PLASMA: LACTIC ACID, VENOUS: 2.5 mmol/L — AB (ref 0.5–2.2)

## 2013-04-16 MED ORDER — POTASSIUM CHLORIDE 20 MEQ/15ML (10%) PO LIQD
20.0000 meq | ORAL | Status: AC
  Administered 2013-04-16 (×2): 20 meq
  Filled 2013-04-16 (×2): qty 15

## 2013-04-16 NOTE — Progress Notes (Signed)
Name: [name] MRN: [id number] DOB: 05/18/1932   LOS 12 days   ADMISSION DATE:  04/04/2013 CONSULTATION DATE:  04/05/13  REFERRING MD :  Dr. Vanessa BarbaraZamora  PRIMARY SERVICE: TRH-->PCCM  CHIEF COMPLAINT:  Hypotension   BRIEF PATIENT DESCRIPTION: 80 y/o F admitted 2/2 with respiratory failure, hypovolemic and presumed septic shock -souuce unclear.  Course complicated by ARDS.  2/8 am suffered mucus plugging with ETT obstruction with subsequent cardiac arrest.  Required CPR x 9 mins, improved with re-intubation & left pneumothorax decompression  SIGNIFICANT EVENTS / STUDIES:  2/03 - Admit with fever, SOB, vomiting, diarrhea (concern for food poisoning) & CXR concerning for interstitial pneumonitis 2/03 - 2 d echo>>>Ef 45% grade 1 diastolic dysfunction  2/08 - resp arrest with mucus plugging, ETT exchanged 2/09 - changed to PCV overnight 2/10 - febrile overnight, hypotension requiring pressors 2/11 - CT Chest/ABD/Pelvis>>ARDS, R pleural effusion, L CT, Air in sq of anterior abd with marked inflammation 2/11 - CT Head>>neg  LINES / TUBES: L IJ TLC 2/3>>> 2/4 OTT>>>2/8, 2/8>>> 2/4 a line>> 2/4 OGT>>  CULTURES: MRSA PCR 2/3>>> negative BCx2 2/3>>>neg Sputum 2/3>>> normal flora UA 2/3>>> negative nit and LE Resp Viral Panel 2/3>>>negative 2/4 tracheal aspirate>>normal flora Stool culture, o&p 2/5>>> Stool PCR 04/11/13>> .................. BCx2 2/11>>> Sputum 2/11>>>neg UA 2/11>>>many yeast UC 2/11>>>greater 100k yeast  ANTIBIOTICS: Rocephin 2/3>>>2/9 Azithro 2/3>>>2/9 Tamiflu 2/3>>>2/4 ............ Clinda 2/12>>> Vanco 2/12>>> Zosyn 2/12>>> Diflucan 2/12 (yeas in urine) >>>2/14   SUBJECTIVE:    04/16/13: fever curve down but wbc up 33K . Still +4L in 7 days but even balance x 3 dauys. Resp secretions improved but cxr stil looks bad  VITAL SIGNS: Temp:  [97.5 F (36.4 C)-99.3 F (37.4 C)] 98.1 F (36.7 C) (02/14 0803) Pulse Rate:  [68-89] 70 (02/14 0803) Resp:   [11-25] 21 (02/14 0803) BP: (123-173)/(47-64) 124/47 mmHg (02/14 0803) SpO2:  [91 %-98 %] 96 % (02/14 0821) Arterial Line BP: (113-184)/(41-77) 138/51 mmHg (02/14 0803) FiO2 (%):  [50 %-60 %] 60 % (02/14 0821) Weight:  [94.5 kg (208 lb 5.4 oz)] 94.5 kg (208 lb 5.4 oz) (02/14 0500)  HEMODYNAMICS: CVP:  [14 mmHg-20 mmHg] 15 mmHg  VENTILATOR SETTINGS: Vent Mode:  [-] PCV FiO2 (%):  [50 %-60 %] 60 % Set Rate:  [14 bmp] 14 bmp PEEP:  [10 cmH20] 10 cmH20 Plateau Pressure:  [16 cmH20-22 cmH20] 19 cmH20  INTAKE / OUTPUT: Intake/Output     02/13 0701 - 02/14 0700 02/14 0701 - 02/15 0700   I.V. (mL/kg) 1837 (19.4) 95.6 (1)   NG/GT 1240 40   IV Piggyback 700    Total Intake(mL/kg) 3777 (40) 135.6 (1.4)   Urine (mL/kg/hr) 2380 (1) 350 (2.6)   Chest Tube 90 (0)    Total Output 2470 350   Net +1307 -214.4        Stool Occurrence 1 x      PHYSICAL EXAMINATION: General: elderly female, looks deconditioned and critically ill in ICU bed. Neuro:  Sedated on vent, RASS -2, alert at times, appears to recognize and fix gaze but intermittentlyfollowing commands at this point HEENT:  Mm pink/dry, no jvd, OETT Cardiovascular:  s1s2 rrr, paced on monitor Lungs:  resps even non labored on vent, diminished bilaterally, left chest tube - without air leak Abdomen:  Round/soft, bsx4 active Musculoskeletal:  No acute deformities  Skin:  Warm/dry, generalized edema.  L great toe with erythema / warmth c/w gout flare  LABS:  PULMONARY  Recent Labs  Lab 04/10/13 0828 04/10/13 1250 04/12/13 0940 04/13/13 1455 04/14/13 1121  PHART 7.374 7.424 7.416 7.445 7.405  PCO2ART 39.1 41.4 43.1 39.5 44.6  PO2ART 58.7* 65.6* 63.7* 67.2* 66.2*  HCO3 22.3 26.6* 27.2* 26.2* 26.8*  TCO2 20.0 23.8 24.2 23.5 23.7  O2SAT 87.9 92.8 91.2 91.9 90.0   CBC  Recent Labs Lab 04/14/13 0400 04/15/13 0410 04/16/13 0438  HGB 10.1* 10.0* 10.4*  HCT 32.1* 32.3* 31.4*  WBC 20.5* 24.2* 33.3*  PLT PLATELET CLUMPS NOTED  ON SMEAR, UNABLE TO ESTIMATE PLATELET CLUMPS NOTED ON SMEAR, UNABLE TO ESTIMATE PLATELET CLUMPS NOTED ON SMEAR, UNABLE TO ESTIMATE   CARDIAC  No results found for this basename: TROPONINI,  in the last 168 hours  Recent Labs Lab 04/13/13 0447 04/15/13 0410 04/16/13 0430  PROBNP 2694.0* 2271.0* 3345.0*   CHEMISTRY  Recent Labs Lab 04/12/13 0409 04/13/13 0447 04/14/13 0400 04/15/13 0410 04/16/13 0438  NA 148* 149* 147 146 145  K 5.0 5.5* 5.0 4.3 3.3*  CL 112 113* 111 108 104  CO2 30 29 29 27 28   GLUCOSE 188* 209* 190* 263* 242*  BUN 33* 42* 35* 39* 53*  CREATININE 1.05 1.22* 1.03 0.96 1.20*  CALCIUM 7.7* 7.8* 7.8* 8.0* 8.1*  MG 2.5 2.2  --  2.4 2.4  PHOS 2.4 3.0  --  3.9 4.2   Estimated Creatinine Clearance: 42.2 ml/min (by C-G formula based on Cr of 1.2).  LIVER No results found for this basename: AST, ALT, ALKPHOS, BILITOT, PROT, ALBUMIN, INR,  in the last 168 hours INFECTIOUS  Recent Labs Lab 04/13/13 0448 04/13/13 0930 04/13/13 1800 04/14/13 0400 04/15/13 0410  LATICACIDVEN  --  2.0 1.6  --   --   PROCALCITON 5.82  --   --  4.31 2.61   ENDOCRINE CBG (last 3)   Recent Labs  04/15/13 2339 04/16/13 0343 04/16/13 0732  GLUCAP 267* 237* 202*   IMAGING x48h  Dg Chest Port 1 View  04/16/2013   CLINICAL DATA:  Evaluate endotracheal tube.  EXAM: PORTABLE CHEST - 1 VIEW  COMPARISON:  Chest radiograph 04/15/2013  FINDINGS: ET tube terminates within the mid trachea 3.9 cm superior to the carina. Markedly low lung volumes. NG tube courses inferior to the diaphragm, tip projects over the left upper quadrant. Multi lead pacer apparatus overlies the left hemi thorax, grossly stable. Pigtail catheter projects over the mid left hemi thorax.  Stable cardiac and mediastinal contours which are largely obscured. No significant interval change diffuse bilateral heterogeneous pulmonary opacities. No definite pleural effusion or pneumothorax.  IMPRESSION: No significant interval  change bilateral pulmonary opacities.  ET tube terminates within the mid trachea.   Electronically Signed   By: Annia Belt M.D.   On: 04/16/2013 06:59   Dg Chest Port 1 View  04/15/2013   CLINICAL DATA:  Bilateral pulmonary infiltrates.  EXAM: PORTABLE CHEST - 1 VIEW  COMPARISON:  04/14/2013  FINDINGS: Endotracheal tube, NG tube, central venous catheter, and pigtail catheter all appear in good position, unchanged. Triple lead pacer in place.  No pneumothorax. Pulmonary infiltrates appears slightly worse in the right upper lobe and in the left mid and lower lung zones. No discrete effusions.  IMPRESSION: Slight worsening of the bilateral pulmonary infiltrates.   Electronically Signed   By: Geanie Cooley M.D.   On: 04/15/2013 07:11   Dg Chest Port 1 View  04/14/2013   CLINICAL DATA:  ET tube advancement.  EXAM: PORTABLE CHEST - 1 VIEW  COMPARISON:  04/14/2013 at 5:07 a.m.  FINDINGS: Endotracheal tube tip now projects 17 mm above the chronic, well positioned.  Bilateral airspace lung opacity is stable. Left pigtail catheter, right internal jugular central venous line, left anterior chest wall sequential pacemaker and nasogastric tube are stable and well positioned.  No pneumothorax.  IMPRESSION: Endotracheal tube tip now lies 1.7 cm above the chronic, well positioned. No other change.   Electronically Signed   By: Amie Portland M.D.   On: 04/14/2013 14:34      ASSESSMENT / PLAN:  PULMONARY A: Acute Respiratory Failure - B infiltrates > ddx = ARDS, viral pneumonitis, bacterial, consider amiodarone or other non-infectious inflammatory process.  Note she has had a similar hospitalization 2-3 yrs ago (1/12). Consider COP, acute amiodarone rxn.  Favor viral or non-infectious etiology. ARDS  Mucus plug - 2/8 with resp arrest Left pneumothorax s/p chest tube - post CPR  04/16/13: STill on PCV 60%, peep 10  P:   -continue PCV, adjust pressures to maintain Vt of 300-500 -Vent bundle -mucomyst & nebs -CT  to 20 cm sxn -trend cxr -solumedrol 80 mg Q6 since 04/15/13  CARDIOVASCULAR A:  Septic and Hypovolemic Shock - transiently required pressors.   Cardiac arrest 2/8 am due to ETT obstruction Hx HTN Hx CHF / ICM s/p Pacemeker - compensated.  EF 55-60% 2/10 AFib on amiodarone  04/16/13: paced. Normal BP  P:  -hold oral amiodarone -hold zocor -monitor hemodynamics - check bnp, check troponin  RENAL A:   Acute Kidney Injury - in setting of arrest.    04/16/13: some mild rise in creat with lasix  P:   -trend BMP -replace lytes as indicated  -repeat lasix 04/16/13; track bno  GASTROINTESTINAL A:   Nausea / Vomiting / Diarrhea - resolved  04/16/13: 1 x BM. Well formed. Tolrating tube feeds but WBC up  P:   - checkmaylase, ck, lactate -follow pending stool cultures (have not been sent / no sample) -PPI -TF per nutrition   HEMATOLOGIC A:   Leukocytosis - steroids initiated 2/12 DVT Prophylaxis   04/16/13: plattelets returning clumped daily  P:  - recheck cbcd stat -heparin for DVT proph -monitor cbc   INFECTIOUS A:   Gastroenteritis, Diarrhea - resolved. R/O CAP vs Viral Respiratory Illness - PCT elevated but trending down (44->27->14), favors infectious cause. -ceftriaxone + azithro completed 2/9 RVP neg - off tamiflu   Fever - noted off abx 2/10.   Yeast UTI   04/16/13: Possible VAP but no cultures and PCT trending down. YEast in urine likely colonizer  P:   -stool O&P & PCR pending (unable to send due to formed stool) -re-culture & initiate broad spectrum abx 2/11 -DC Diflucan  ENDOCRINE A:   Hypothyroidism   Gout  - with acute flare P:   -continue synthroid   NEUROLOGIC A:   At Risk Anoxic Encephalopathy s/p CPR - CT head neg 2/11  04/16/13: Following command intermittently during WUA  P:   -cont fent gtt, PRN versed -precedex gtt -Serial exams -daily WUA    GLOBAL: -continue full support measures -04/15/13: ongoing discussions with family  regarding tracheostomy  Family agreeable to trach if still not extubated by Monday 04/18/13 but they do not want to talk about it anymore due to emotonal anxiety - 04/16/13: no family at bedside     The patient is critically ill with multiple organ systems failure and requires high complexity decision making for assessment and support, frequent evaluation and titration of  therapies, application of advanced monitoring technologies and extensive interpretation of multiple databases.   Critical Care Time devoted to patient care services described in this note is  35  Minutes.  Dr. Kalman Shan, M.D., [facility].C.P Pulmonary and Critical Care Medicine Staff Physician [facility] [place] [facility] Pager: [phone], If no answer or between  15:00h - 7:00h: call [phone]  04/16/2013 8:27 AM

## 2013-04-16 NOTE — Progress Notes (Signed)
Family has requested a meeting with the doctor and nurse on Sunday (04/17/13) morning.

## 2013-04-16 NOTE — Progress Notes (Signed)
[facility] ADULT ICU REPLACEMENT PROTOCOL FOR AM LAB REPLACEMENT ONLY  The patient does apply for the [facility] Adult ICU Electrolyte Replacment Protocol based on the criteria listed below:   1. Is GFR >/= 40 ml/min? yes  Patient's GFR today is 42 2. Is urine output >/= 0.5 ml/kg/hr for the last 6 hours? yes Patient's UOP is 1.4 ml/kg/hr 3. Is BUN < 60 mg/dL? yes  Patient's BUN today is 53 4. Abnormal electrolyte(s): K 3.35. Ordered repletion with: per protocol 6. If a panic level lab has been reported, has the CCM MD in charge been notified? no.   Physician:    Markus Daft A 04/16/2013 5:54 AM

## 2013-04-17 ENCOUNTER — Inpatient Hospital Stay (HOSPITAL_COMMUNITY): Payer: Medicare Other

## 2013-04-17 DIAGNOSIS — I959 Hypotension, unspecified: Secondary | ICD-10-CM

## 2013-04-17 LAB — CBC WITH DIFFERENTIAL/PLATELET
BASOS PCT: 0 % (ref 0–1)
Basophils Absolute: 0 10*3/uL (ref 0.0–0.1)
EOS ABS: 0 10*3/uL (ref 0.0–0.7)
EOS PCT: 0 % (ref 0–5)
HCT: 30.8 % — ABNORMAL LOW (ref 36.0–46.0)
Hemoglobin: 9.6 g/dL — ABNORMAL LOW (ref 12.0–15.0)
LYMPHS PCT: 2 % — AB (ref 12–46)
Lymphs Abs: 0.6 10*3/uL — ABNORMAL LOW (ref 0.7–4.0)
MCH: 29.3 pg (ref 26.0–34.0)
MCHC: 31.2 g/dL (ref 30.0–36.0)
MCV: 93.9 fL (ref 78.0–100.0)
Monocytes Absolute: 1.1 10*3/uL — ABNORMAL HIGH (ref 0.1–1.0)
Monocytes Relative: 4 % (ref 3–12)
NEUTROS PCT: 94 % — AB (ref 43–77)
Neutro Abs: 26.5 10*3/uL — ABNORMAL HIGH (ref 1.7–7.7)
PLATELETS: ADEQUATE 10*3/uL (ref 150–400)
RBC: 3.28 MIL/uL — AB (ref 3.87–5.11)
RDW: 15.6 % — ABNORMAL HIGH (ref 11.5–15.5)
Smear Review: ADEQUATE
WBC: 28.2 10*3/uL — ABNORMAL HIGH (ref 4.0–10.5)

## 2013-04-17 LAB — BASIC METABOLIC PANEL
BUN: 57 mg/dL — ABNORMAL HIGH (ref 6–23)
CALCIUM: 8 mg/dL — AB (ref 8.4–10.5)
CO2: 30 mEq/L (ref 19–32)
Chloride: 110 mEq/L (ref 96–112)
Creatinine, Ser: 1.42 mg/dL — ABNORMAL HIGH (ref 0.50–1.10)
GFR, EST AFRICAN AMERICAN: 40 mL/min — AB (ref >90)
GFR, EST NON AFRICAN AMERICAN: 34 mL/min — AB (ref >90)
Glucose, Bld: 225 mg/dL — ABNORMAL HIGH (ref 70–99)
POTASSIUM: 3.6 meq/L — AB (ref 3.7–5.3)
SODIUM: 152 meq/L — AB (ref 137–147)

## 2013-04-17 LAB — GLUCOSE, CAPILLARY
GLUCOSE-CAPILLARY: 227 mg/dL — AB (ref 70–99)
Glucose-Capillary: 271 mg/dL — ABNORMAL HIGH (ref 70–99)
Glucose-Capillary: 204 mg/dL — ABNORMAL HIGH (ref 70–99)
Glucose-Capillary: 207 mg/dL — ABNORMAL HIGH (ref 70–99)
Glucose-Capillary: 252 mg/dL — ABNORMAL HIGH (ref 70–99)
Glucose-Capillary: 246 mg/dL — ABNORMAL HIGH (ref 70–99)

## 2013-04-17 LAB — PHOSPHORUS: Phosphorus: 4.2 mg/dL (ref 2.3–4.6)

## 2013-04-17 LAB — TROPONIN I

## 2013-04-17 LAB — MAGNESIUM: Magnesium: 2.5 mg/dL (ref 1.5–2.5)

## 2013-04-17 MED ORDER — HEPARIN SODIUM (PORCINE) 5000 UNIT/ML IJ SOLN: 5000.0000 [IU] | Freq: Three times a day (TID) | INTRAMUSCULAR | Status: DC

## 2013-04-17 MED ORDER — FREE WATER
200.0000 mL | Freq: Three times a day (TID) | Status: DC
  Administered 2013-04-17 – 2013-04-18 (×3): 200 mL

## 2013-04-17 NOTE — Progress Notes (Signed)
Name: [name] G Kondracki MRN: [id number] DOB: 03/04/1932   LOS 13 days   ADMISSION DATE:  04/04/2013 CONSULTATION DATE:  04/05/13  REFERRING MD :  Dr. Vanessa BarbaraZamora  PRIMARY SERVICE: TRH-->PCCM  CHIEF COMPLAINT:  Hypotension   BRIEF PATIENT DESCRIPTION: 80 y/o F admitted 2/2 with respiratory failure, hypovolemic and presumed septic shock -souuce unclear.  Course complicated by ARDS.  2/8 am suffered mucus plugging with ETT obstruction with subsequent cardiac arrest.  Required CPR x 9 mins, improved with re-intubation & left pneumothorax decompression  SIGNIFICANT EVENTS / STUDIES:  2/03 - Admit with fever, SOB, vomiting, diarrhea (concern for food poisoning) & CXR concerning for interstitial pneumonitis 2/03 - 2 d echo>>>Ef 45% grade 1 diastolic dysfunction  2/08 - resp arrest with mucus plugging, ETT exchanged 2/09 - changed to PCV overnight 2/10 - febrile overnight, hypotension requiring pressors 2/11 - CT Chest/ABD/Pelvis>>ARDS, R pleural effusion, L CT, Air in sq of anterior abd with marked inflammation 2/11 - CT Head>>neg 04/16/13: fever curve down but wbc up 33K . Still +4L in 7 days but even balance x 3 dauys. Resp secretions improved but cxr stil looks bad  LINES / TUBES: L IJ TLC 2/3>>> 2/4 OTT>>>2/8, 2/8>>> 2/4 a line>>2/14 2/4 OGT>>  CULTURES: MRSA PCR 2/3>>> negative BCx2 2/3>>>neg Sputum 2/3>>> normal flora UA 2/3>>> negative nit and LE Resp Viral Panel 2/3>>>negative 2/4 tracheal aspirate>>normal flora Stool culture, o&p 2/5>>> Stool PCR 04/11/13>> .................. BCx2 2/11>>> Sputum 2/11>>>few candidate UA 2/11>>>many yeast UC 2/11>>>greater 100k yeast      ANTIBIOTICS: Rocephin 2/3>>>2/9 Azithro 2/3>>>2/9 Tamiflu 2/3>>>2/4 ............ Clinda 2/12>>>2/15 Vanco 2/12>>>2/15 Zosyn 2/12>>> Diflucan 2/12 (yeas in urine) >>>2/14    SUBJECTIVE:     04/17/13: Down to 40% fio2. Failed SBT - weak neuromuscular. CXR ? Some better.  Stil +4L x 7 days despite  lasix. Getting hypernatremic. Still with 3rd spacing. Tolerating tube feeds  VITAL SIGNS: Temp:  [98.6 F (37 C)-100.2 F (37.9 C)] 100 F (37.8 C) (02/15 1000) Pulse Rate:  [70-105] 78 (02/15 1000) Resp:  [13-30] 16 (02/15 1000) BP: (116-146)/(30-65) 134/45 mmHg (02/15 1000) SpO2:  [92 %-97 %] 94 % (02/15 1000) Arterial Line BP: (122-162)/(50-69) 131/54 mmHg (02/14 1700) FiO2 (%):  [40 %-50 %] 40 % (02/15 0807) Weight:  [94.1 kg (207 lb 7.3 oz)] 94.1 kg (207 lb 7.3 oz) (02/15 0500)  HEMODYNAMICS: CVP:  [13 mmHg] 13 mmHg  VENTILATOR SETTINGS: Vent Mode:  [-] CPAP FiO2 (%):  [40 %-50 %] 40 % Set Rate:  [14 bmp] 14 bmp PEEP:  [8 cmH20-10 cmH20] 8 cmH20 Pressure Support:  [5 cmH20] 5 cmH20 Plateau Pressure:  [14 cmH20-19 cmH20] 16 cmH20  INTAKE / OUTPUT: Intake/Output     02/14 0701 - 02/15 0700 02/15 0701 - 02/16 0700   I.V. (mL/kg) 2014.4 (21.4) 271.8 (2.9)   NG/GT 1080 120   IV Piggyback 550    Total Intake(mL/kg) 3644.4 (38.7) 391.8 (4.2)   Urine (mL/kg/hr) 3650 (1.6) 150 (0.4)   Chest Tube 135 (0.1)    Total Output 3785 150   Net -140.6 +241.8        Stool Occurrence 1 x      PHYSICAL EXAMINATION: General: elderly female, looks deconditioned and critically ill in ICU bed. Neuro:  Sedated on vent, RASS -2, alert at times, appears to recognize and fix gaze but intermittentlyfollowing commands at this point HEENT:  Mm pink/dry, no jvd, OETT Cardiovascular:  s1s2 rrr, paced on monitor Lungs:  resps even non labored  on vent, diminished bilaterally, left chest tube - without air leak Abdomen:  Round/soft, bsx4 active Musculoskeletal:  No acute deformities  Skin:  Warm/dry, generalized +++ edema .  L great toe with erythema / warmth c/w gout flare  LABS:  PULMONARY  Recent Labs Lab 04/10/13 1250 04/12/13 0940 04/13/13 1455 04/14/13 1121  PHART 7.424 7.416 7.445 7.405  PCO2ART 41.4 43.1 39.5 44.6  PO2ART 65.6* 63.7* 67.2* 66.2*  HCO3 26.6* 27.2* 26.2* 26.8*   TCO2 23.8 24.2 23.5 23.7  O2SAT 92.8 91.2 91.9 90.0   CBC  Recent Labs Lab 04/16/13 0438 04/16/13 0955 04/17/13 0420  HGB 10.4* 10.6* 9.6*  HCT 31.4* 32.5* 30.8*  WBC 33.3* 37.6* 28.2*  PLT PLATELET CLUMPS NOTED ON SMEAR, UNABLE TO ESTIMATE 123* PLATELET CLUMPS NOTED ON SMEAR, COUNT APPEARS ADEQUATE   CARDIAC    Recent Labs Lab 04/16/13 0955 04/16/13 1638 04/17/13 0025  TROPONINI <0.30 <0.30 <0.30    Recent Labs Lab 04/13/13 0447 04/15/13 0410 04/16/13 0430  PROBNP 2694.0* 2271.0* 3345.0*   CHEMISTRY  Recent Labs Lab 04/12/13 0409 04/13/13 0447 04/14/13 0400 04/15/13 0410 04/16/13 0438 04/17/13 0420  NA 148* 149* 147 146 145 152*  K 5.0 5.5* 5.0 4.3 3.3* 3.6*  CL 112 113* 111 108 104 110  CO2 30 29 29 27 28 30   GLUCOSE 188* 209* 190* 263* 242* 225*  BUN 33* 42* 35* 39* 53* 57*  CREATININE 1.05 1.22* 1.03 0.96 1.20* 1.42*  CALCIUM 7.7* 7.8* 7.8* 8.0* 8.1* 8.0*  MG 2.5 2.2  --  2.4 2.4 2.5  PHOS 2.4 3.0  --  3.9 4.2 4.2   Estimated Creatinine Clearance: 35.6 ml/min (by C-G formula based on Cr of 1.42).  LIVER  Recent Labs Lab 04/16/13 0955  AST 20  ALT 22  ALKPHOS 53  BILITOT 0.2*  PROT 5.6*  ALBUMIN 1.6*   INFECTIOUS  Recent Labs Lab 04/13/13 0448 04/13/13 0930 04/13/13 1800 04/14/13 0400 04/15/13 0410 04/16/13 1010  LATICACIDVEN  --  2.0 1.6  --   --  2.5*  PROCALCITON 5.82  --   --  4.31 2.61  --    ENDOCRINE CBG (last 3)   Recent Labs  04/16/13 2312 04/17/13 0329 04/17/13 0745  GLUCAP 271* 204* 207*   IMAGING x48h  Dg Chest Port 1 View  04/17/2013   CLINICAL DATA:  Endotracheal tube placement  EXAM: PORTABLE CHEST - 1 VIEW  COMPARISON:  DG CHEST 1V PORT dated 04/16/2013; DG CHEST 1V PORT dated 04/14/2013; DG CHEST 1V PORT dated 04/13/2013; CT CHEST W/O CM dated 04/13/2013  FINDINGS: Grossly unchanged cardiac silhouette and mediastinal contours. Stable position of support apparatus. No definite pneumothorax. Grossly  unchanged extensive bilateral heterogeneous airspace opacities with relative areas of confluence within the right suprahilar lung and left lower lung. Trace left-sided effusion is not excluded. No new discrete focal airspace opacities. Pulmonary vasculature remains indistinct. Unchanged bones.  IMPRESSION: 1.  Stable positioning of support apparatus.  No pneumothorax. 2. Similar appearance of the chest with extensive bilateral heterogeneous airspace opacities with relative areas of consolidation within the right suprahilar and left lower lung, findings again worrisome for multifocal infection. No new discrete focal airspace opacities. 3. Suspected unchanged underlying pulmonary edema.   Electronically Signed   By: Simonne Come M.D.   On: 04/17/2013 07:34   Dg Chest Port 1 View  04/16/2013   CLINICAL DATA:  Evaluate endotracheal tube.  EXAM: PORTABLE CHEST - 1 VIEW  COMPARISON:  Chest  radiograph 04/15/2013  FINDINGS: ET tube terminates within the mid trachea 3.9 cm superior to the carina. Markedly low lung volumes. NG tube courses inferior to the diaphragm, tip projects over the left upper quadrant. Multi lead pacer apparatus overlies the left hemi thorax, grossly stable. Pigtail catheter projects over the mid left hemi thorax.  Stable cardiac and mediastinal contours which are largely obscured. No significant interval change diffuse bilateral heterogeneous pulmonary opacities. No definite pleural effusion or pneumothorax.  IMPRESSION: No significant interval change bilateral pulmonary opacities.  ET tube terminates within the mid trachea.   Electronically Signed   By: Annia Belt M.D.   On: 04/16/2013 06:59      ASSESSMENT / PLAN:  PULMONARY A: Acute Respiratory Failure - B infiltrates > ddx = ARDS, viral pneumonitis, bacterial, consider amiodarone or other non-infectious inflammatory process.  Note she has had a similar hospitalization 2-3 yrs ago (1/12). Consider COP, acute amiodarone rxn.  Favor viral  or non-infectious etiology. ARDS  Mucus plug - 2/8 with resp arrest Left pneumothorax s/p chest tube - post CPR  04/17/13: STill on PCV but improved to 40%, peep 8. Failed SBT - suspect due to chronic critical illness  P:   -continue PCV, pressures to maintain Vt of 300-500 -Vent bundle -mucomyst & nebs -CT to 20 cm sxn -trend cxr -solumedrol 80 mg Q6 since 04/15/13  CARDIOVASCULAR A:  Septic and Hypovolemic Shock - transiently required pressors.   Cardiac arrest 2/8 am due to ETT obstruction Hx HTN Hx CHF / ICM s/p Pacemeker - compensated.  EF 55-60% 2/10 AFib on amiodarone for years  04/17/13: paced. Normal BP  P:  -hold oral amiodarone -hold zocor -monitor hemodynamics   RENAL A:   Acute Kidney Injury - in setting of arrest.    04/17/13: some mild rise in creat with lasix with high Na  P:   - start free water but continue lasix -trend BMP -replace lytes as indicated    GASTROINTESTINAL A:   Nausea / Vomiting / Diarrhea - resolved  04/1513:  Tolerating tube feeds   P:   -follow pending stool cultures (have not been sent / no sample) -PPI -TF per nutrition   HEMATOLOGIC A:   Leukocytosis - steroids initiated 2/12 DVT Prophylaxis   04/16/13: plattelets returning clumped periodically  P:  -heparin for DVT proph -monitor cbc   INFECTIOUS A:   Gastroenteritis, Diarrhea - resolved. R/O CAP vs Viral Respiratory Illness - PCT elevated but trending down (44->27->14), favors infectious cause. -ceftriaxone + azithro completed 2/9 RVP neg - off tamiflu   Fever - noted off abx 2/10.   Yeast UTI   04/17/13: Possible VAP but no cultures and PCT trending down. YEast in urine likely colonizer  P:   -stool O&P & PCR pending (unable to send due to formed stool) Dc clinda Dc vanc Continue zosyn   ENDOCRINE A:   Hypothyroidism   Gout  - with acute flare  P:   -continue synthroid   NEUROLOGIC A:   At Risk Anoxic Encephalopathy s/p CPR - CT head neg  2/11  04/1513: Following command intermittently during WUA  P:   -cont fent gtt, -  PRN versed -precedex gtt -Serial exams -daily WUA    GLOBAL: -continue full support measures -04/15/13: ongoing discussions with family regarding tracheostomy  Family agreeable to trach if still not extubated by Monday 04/18/13 but they do not want to talk about it anymore due to emotonal anxiety - 04/16/13: no family  at bedside - 04/17/13: 40 minute family update at bedside about all details of trach,ltac and they are considering. Advised them to let team know 04/18/13. Futures trader to discuss LTAC with familyu     The patient is critically ill with multiple organ systems failure and requires high complexity decision making for assessment and support, frequent evaluation and titration of therapies, application of advanced monitoring technologies and extensive interpretation of multiple databases.   Critical Care Time devoted to patient care services described in this note is  75  Minutes.  Dr. Kalman Shan, M.D., [facility].C.P Pulmonary and Critical Care Medicine Staff Physician [facility] [place] [facility] Pager: [phone], If no answer or between  15:00h - 7:00h: call [phone]  04/17/2013 11:26 AM

## 2013-04-18 LAB — GI PATHOGEN PANEL BY PCR, STOOL
C difficile toxin A/B: NEGATIVE
Campylobacter by PCR: NEGATIVE
Cryptosporidium by PCR: NEGATIVE
E coli (ETEC) LT/ST: NEGATIVE
E coli (STEC): NEGATIVE
E coli 0157 by PCR: NEGATIVE
G LAMBLIA BY PCR: NEGATIVE
NOROVIRUS G1/G2: POSITIVE
ROTAVIRUS A BY PCR: NEGATIVE
SALMONELLA BY PCR: NEGATIVE
SHIGELLA BY PCR: NEGATIVE

## 2013-04-18 LAB — GLUCOSE, CAPILLARY
GLUCOSE-CAPILLARY: 274 mg/dL — AB (ref 70–99)
GLUCOSE-CAPILLARY: 191 mg/dL — AB (ref 70–99)
Glucose-Capillary: 271 mg/dL — ABNORMAL HIGH (ref 70–99)
Glucose-Capillary: 258 mg/dL — ABNORMAL HIGH (ref 70–99)
Glucose-Capillary: 246 mg/dL — ABNORMAL HIGH (ref 70–99)
Glucose-Capillary: 220 mg/dL — ABNORMAL HIGH (ref 70–99)
Glucose-Capillary: 166 mg/dL — ABNORMAL HIGH (ref 70–99)

## 2013-04-18 LAB — CBC WITH DIFFERENTIAL/PLATELET
Basophils Absolute: 0 10*3/uL (ref 0.0–0.1)
Basophils Relative: 0 % (ref 0–1)
EOS PCT: 0 % (ref 0–5)
Eosinophils Absolute: 0 10*3/uL (ref 0.0–0.7)
HEMATOCRIT: 29.5 % — AB (ref 36.0–46.0)
Hemoglobin: 9.3 g/dL — ABNORMAL LOW (ref 12.0–15.0)
Lymphocytes Relative: 3 % — ABNORMAL LOW (ref 12–46)
Lymphs Abs: 0.8 10*3/uL (ref 0.7–4.0)
MCH: 29.2 pg (ref 26.0–34.0)
MCHC: 31.5 g/dL (ref 30.0–36.0)
MCV: 92.8 fL (ref 78.0–100.0)
MONOS PCT: 2 % — AB (ref 3–12)
Monocytes Absolute: 0.5 10*3/uL (ref 0.1–1.0)
NEUTROS ABS: 25.7 10*3/uL — AB (ref 1.7–7.7)
Neutrophils Relative %: 95 % — ABNORMAL HIGH (ref 43–77)
PLATELETS: ADEQUATE 10*3/uL (ref 150–400)
RBC: 3.18 MIL/uL — AB (ref 3.87–5.11)
RDW: 15.6 % — ABNORMAL HIGH (ref 11.5–15.5)
WBC: 27 10*3/uL — AB (ref 4.0–10.5)

## 2013-04-18 LAB — BASIC METABOLIC PANEL
BUN: 64 mg/dL — ABNORMAL HIGH (ref 6–23)
CHLORIDE: 110 meq/L (ref 96–112)
CO2: 31 mEq/L (ref 19–32)
Calcium: 7.6 mg/dL — ABNORMAL LOW (ref 8.4–10.5)
Creatinine, Ser: 1.52 mg/dL — ABNORMAL HIGH (ref 0.50–1.10)
GFR calc non Af Amer: 32 mL/min — ABNORMAL LOW (ref >90)
GFR, EST AFRICAN AMERICAN: 37 mL/min — AB (ref >90)
Glucose, Bld: 262 mg/dL — ABNORMAL HIGH (ref 70–99)
POTASSIUM: 3.2 meq/L — AB (ref 3.7–5.3)
Sodium: 152 mEq/L — ABNORMAL HIGH (ref 137–147)

## 2013-04-18 LAB — PHOSPHORUS: Phosphorus: 4.4 mg/dL (ref 2.3–4.6)

## 2013-04-18 LAB — CLOSTRIDIUM DIFFICILE BY PCR: Toxigenic C. Difficile by PCR: NEGATIVE

## 2013-04-18 LAB — LACTIC ACID, PLASMA: Lactic Acid, Venous: 2.4 mmol/L — ABNORMAL HIGH (ref 0.5–2.2)

## 2013-04-18 LAB — MAGNESIUM: Magnesium: 2.3 mg/dL (ref 1.5–2.5)

## 2013-04-18 MED ORDER — METRONIDAZOLE IN NACL 5-0.79 MG/ML-% IV SOLN
500.0000 mg | Freq: Four times a day (QID) | INTRAVENOUS | Status: DC
  Filled 2013-04-18 (×3): qty 100

## 2013-04-18 MED ORDER — FUROSEMIDE 10 MG/ML IJ SOLN
40.0000 mg | Freq: Every day | INTRAMUSCULAR | Status: DC
  Administered 2013-04-18: 40 mg via INTRAVENOUS
  Filled 2013-04-18: qty 4

## 2013-04-18 MED ORDER — METRONIDAZOLE IN NACL 5-0.79 MG/ML-% IV SOLN
500.0000 mg | Freq: Three times a day (TID) | INTRAVENOUS | Status: DC
  Administered 2013-04-18: 500 mg via INTRAVENOUS
  Filled 2013-04-18 (×4): qty 100

## 2013-04-18 MED ORDER — HALOPERIDOL LACTATE 5 MG/ML IJ SOLN
5.0000 mg | INTRAMUSCULAR | Status: DC | PRN
  Administered 2013-04-18 – 2013-04-19 (×3): 5 mg via INTRAVENOUS
  Filled 2013-04-18 (×2): qty 1

## 2013-04-18 MED ORDER — LEVOTHYROXINE SODIUM 100 MCG IV SOLR
37.5000 ug | Freq: Every day | INTRAVENOUS | Status: DC
  Administered 2013-04-19 – 2013-04-21 (×3): 37.5 ug via INTRAVENOUS
  Filled 2013-04-18 (×4): qty 5

## 2013-04-18 MED ORDER — FREE WATER
200.0000 mL | Status: DC
Start: 2013-04-18 — End: 2013-04-18

## 2013-04-18 MED ORDER — METRONIDAZOLE 50 MG/ML ORAL SUSPENSION
500.0000 mg | Freq: Three times a day (TID) | ORAL | Status: DC
  Filled 2013-04-18 (×3): qty 10

## 2013-04-18 MED ORDER — FENTANYL CITRATE 0.05 MG/ML IJ SOLN
25.0000 ug | INTRAMUSCULAR | Status: DC | PRN
  Administered 2013-04-18: 25 ug via INTRAVENOUS
  Administered 2013-04-18 – 2013-04-20 (×8): 50 ug via INTRAVENOUS
  Filled 2013-04-18 (×7): qty 2

## 2013-04-18 MED ORDER — PANTOPRAZOLE SODIUM 40 MG IV SOLR
40.0000 mg | Freq: Every day | INTRAVENOUS | Status: DC
  Administered 2013-04-18 – 2013-04-21 (×4): 40 mg via INTRAVENOUS
  Filled 2013-04-18 (×3): qty 40

## 2013-04-18 MED ORDER — POTASSIUM CHLORIDE 10 MEQ/50ML IV SOLN
10.0000 meq | INTRAVENOUS | Status: AC
  Administered 2013-04-18 (×5): 10 meq via INTRAVENOUS
  Filled 2013-04-18 (×5): qty 50

## 2013-04-18 MED ORDER — FENTANYL CITRATE 0.05 MG/ML IJ SOLN
INTRAMUSCULAR | Status: AC
  Administered 2013-04-18: 25 ug via INTRAVENOUS
  Filled 2013-04-18: qty 2

## 2013-04-18 MED ORDER — METRONIDAZOLE 500 MG PO TABS
500.0000 mg | ORAL_TABLET | Freq: Three times a day (TID) | ORAL | Status: DC
  Filled 2013-04-18 (×3): qty 1

## 2013-04-18 MED ORDER — METHYLPREDNISOLONE SODIUM SUCC 40 MG IJ SOLR
40.0000 mg | Freq: Two times a day (BID) | INTRAMUSCULAR | Status: DC
Start: 2013-04-18 — End: 2013-04-19
  Administered 2013-04-18 – 2013-04-19 (×2): 40 mg via INTRAVENOUS
  Filled 2013-04-18 (×2): qty 1

## 2013-04-18 NOTE — Progress Notes (Signed)
[facility] ADULT ICU REPLACEMENT PROTOCOL FOR AM LAB REPLACEMENT ONLY  The patient does not apply for the [facility] Adult ICU Electrolyte Replacment Protocol based on the criteria listed below:   1. Is GFR >/= 40 ml/min? no  Patient's GFR today is 32 2. Is urine output >/= 0.5 ml/kg/hr for the last 6 hours? no Patient's UOP is  ml/kg/hr 3. Is BUN < 60 mg/dL? no  Patient's BUN today is 64 4. Abnormal electrolyte(s): K 3.2 5. Ordered repletion with: NA 6. If a panic level lab has been reported, has the CCM MD in charge been notified? no.   Physician:    Markus Daft A 04/18/2013 5:35 AM

## 2013-04-18 NOTE — Progress Notes (Signed)
CARE MANAGEMENT NOTE 04/18/2013  Patient:  [name], [name]   Account Number:  [id number]  Date Initiated:  04/05/2013  Documentation initiated by:  Leilanny Fluitt  Subjective/Objective Assessment:   pt with hx of resp failure admitted with hypoxia and requiring bipap.     Action/Plan:   lives at home with spouse , no known history of hhc or o2 at home.   Anticipated DC Date:  04/21/2013   Anticipated DC Plan:  LONG TERM ACUTE CARE (LTAC)  In-house referral  NA      DC Planning Services  CM consult      PAC Choice  LONG TERM ACUTE CARE   Choice offered to / List presented to:  NA   DME arranged  NA      DME agency  NA     HH arranged  NA      HH agency  NA   Status of service:  In process, will continue to follow Medicare Important Message given?  NA - LOS <3 / Initial given by admissions (If response is "NO", the following Medicare IM given date fields will be blank) Date Medicare IM given:   Date Additional Medicare IM given:    Discharge Disposition:    Per UR Regulation:  Reviewed for med. necessity/level of care/duration of stay  If discussed at Long Length of Stay Meetings, dates discussed:   04/12/2013  04/14/2013    Comments:  [id number] Earlene Plater RN, BSN, CCM [phone] Chart Reviewed for discharge and hospital needs. Discharge needs at time of review:  None present will follow for needs. Review of patient progress due on [id number] Active weaning to place [id number]/plan if positive trend to do cir referral if not able to wean will contact ltac  [id number]/Phoenyx Paulsen Earlene Plater, RN, BSN, [place], [phone] Chart reviewed for update of needs and condition. vent day3/ weaning process started this AU/QJ33 down to 40% and peep down to 8.   [id number] Earlene Plater, RN, BSN, [place], [phone] Chart reviewed for update of needs and condition. patient has Pacemaker. [id number] of Arterial Catheter Indications: Blood pressure monitoring and Frequent blood  sampling/intubated [id number]/CVP 5 mmHg/VENT DAY 1     [id number]/Deliliah Spranger Earlene Plater, RN, BSN, [place] [phone] Chart Reviewed for discharge and hospital needs. Discharge needs at time of review:  None present will follow for needs. Review of patient progress due on [id number].

## 2013-04-18 NOTE — Progress Notes (Signed)
Name: [name] MRN: [id number] DOB: 04-Aug-1932   LOS 14 days   ADMISSION DATE:  04/04/2013 CONSULTATION DATE:  04/05/13  REFERRING MD :  Dr. Vanessa Barbara  PRIMARY SERVICE: TRH-->PCCM  CHIEF COMPLAINT:  Hypotension   BRIEF PATIENT DESCRIPTION: 80 y/o F admitted 2/2 with respiratory failure, hypovolemic and presumed septic shock -souuce unclear.  Course complicated by ARDS.  2/8 am suffered mucus plugging with ETT obstruction with subsequent cardiac arrest.  Required CPR x 9 mins, improved with re-intubation & left pneumothorax decompression  SIGNIFICANT EVENTS / STUDIES:  2/03 - Admit with fever, SOB, vomiting, diarrhea (concern for food poisoning) & CXR concerning for interstitial pneumonitis 2/03 - 2 d echo>>>Ef 45% grade 1 diastolic dysfunction  2/08 - resp arrest with mucus plugging, ETT exchanged 2/09 - changed to PCV overnight 2/10 - febrile overnight, hypotension requiring pressors 2/11 - CT Chest/ABD/Pelvis>>ARDS, R pleural effusion, L CT, Air in sq of anterior abd with marked inflammation 2/11 - CT Head>>neg 04/16/13: fever curve down but wbc up 33K . Still +4L in 7 days but even balance x 3 dauys. Resp secretions improved but cxr stil looks bad  LINES / TUBES: L IJ TLC 2/3>>> 2/4 OTT>>>2/8, 2/8 (mucus plug)>>> 2/4 a line>>2/14 2/4 OGT>>  CULTURES: MRSA PCR 2/3>>> negative BCx2 2/3>>>neg Sputum 2/3>>> normal flora UA 2/3>>> negative nit and LE Resp Viral Panel 2/3>>>negative 2/4 tracheal aspirate>>normal flora Stool culture, o&p 2/5>>> Stool PCR 04/11/13>> .................. BCx2 2/11>>>ng Sputum 2/11>>>few candidate UA 2/11>>>many yeast UC 2/11>>>greater 100k yeast      ANTIBIOTICS: Rocephin 2/3>>>2/9 Azithro 2/3>>>2/9 Tamiflu 2/3>>>2/4 ............ Clinda 2/12>>>2/15 Vanco 2/12>>>2/15 Zosyn 2/12>>> Diflucan 2/12 (yeas in urine) >>>2/14    SUBJECTIVE:   Low grade temp Loose stools No obvious pain Needing high dose fent gtt  VITAL SIGNS: Temp:   [99.5 F (37.5 C)-100.9 F (38.3 C)] 100.8 F (38.2 C) (02/16 0700) Pulse Rate:  [78-105] 86 (02/16 0755) Resp:  [15-30] 30 (02/16 0755) BP: (117-175)/(28-71) 168/43 mmHg (02/16 0755) SpO2:  [88 %-96 %] 93 % (02/16 0757) FiO2 (%):  [40 %] 40 % (02/16 0759) Weight:  [208 lb 5.4 oz (94.5 kg)] 208 lb 5.4 oz (94.5 kg) (02/16 0527)  HEMODYNAMICS:    VENTILATOR SETTINGS: Vent Mode:  [-] CPAP;PSV FiO2 (%):  [40 %] 40 % Set Rate:  [14 bmp] 14 bmp PEEP:  [5 cmH20-8 cmH20] 5 cmH20 Pressure Support:  [8 cmH20] 8 cmH20 Plateau Pressure:  [22 cmH20-24 cmH20] 22 cmH20  INTAKE / OUTPUT: Intake/Output     02/15 0701 - 02/16 0700 02/16 0701 - 02/17 0700   I.V. (mL/kg) 1843.8 (19.5)    NG/GT 920    IV Piggyback 137.5    Total Intake(mL/kg) 2901.3 (30.7)    Urine (mL/kg/hr) 2385 (1.1)    Chest Tube     Total Output 2385     Net +516.3          Stool Occurrence 1 x      PHYSICAL EXAMINATION: General: elderly female, looks deconditioned and critically ill in ICU bed. Neuro:  Sedated on vent, RASS -2, alert at times, appears to recognize and fix gaze but intermittentlyfollowing commands HEENT:  Mm pink/dry, no jvd, OETT Cardiovascular:  s1s2 rrr, paced on monitor Lungs:  resps even non labored on vent, diminished bilaterally, left chest tube - without air leak Abdomen:  Round/soft, bsx4 active Musculoskeletal:  No acute deformities  Skin:  Warm/dry, generalized +++ edema .  L great toe with erythema / warmth  c/w gout flare  LABS:  PULMONARY  Recent Labs Lab 04/12/13 0940 04/13/13 1455 04/14/13 1121  PHART 7.416 7.445 7.405  PCO2ART 43.1 39.5 44.6  PO2ART 63.7* 67.2* 66.2*  HCO3 27.2* 26.2* 26.8*  TCO2 24.2 23.5 23.7  O2SAT 91.2 91.9 90.0   CBC  Recent Labs Lab 04/16/13 0955 04/17/13 0420 04/18/13 0439  HGB 10.6* 9.6* 9.3*  HCT 32.5* 30.8* 29.5*  WBC 37.6* 28.2* 27.0*  PLT 123* PLATELET CLUMPS NOTED ON SMEAR, COUNT APPEARS ADEQUATE PLATELET CLUMPS NOTED ON SMEAR,  COUNT APPEARS ADEQUATE   CARDIAC    Recent Labs Lab 04/16/13 0955 04/16/13 1638 04/17/13 0025  TROPONINI <0.30 <0.30 <0.30    Recent Labs Lab 04/13/13 0447 04/15/13 0410 04/16/13 0430  PROBNP 2694.0* 2271.0* 3345.0*   CHEMISTRY  Recent Labs Lab 04/13/13 0447 04/14/13 0400 04/15/13 0410 04/16/13 0438 04/17/13 0420 04/18/13 0439  NA 149* 147 146 145 152* 152*  K 5.5* 5.0 4.3 3.3* 3.6* 3.2*  CL 113* 111 108 104 110 110  CO2 29 29 27 28 30 31   GLUCOSE 209* 190* 263* 242* 225* 262*  BUN 42* 35* 39* 53* 57* 64*  CREATININE 1.22* 1.03 0.96 1.20* 1.42* 1.52*  CALCIUM 7.8* 7.8* 8.0* 8.1* 8.0* 7.6*  MG 2.2  --  2.4 2.4 2.5 2.3  PHOS 3.0  --  3.9 4.2 4.2 4.4   Estimated Creatinine Clearance: 33.3 ml/min (by C-G formula based on Cr of 1.52).  LIVER  Recent Labs Lab 04/16/13 0955  AST 20  ALT 22  ALKPHOS 53  BILITOT 0.2*  PROT 5.6*  ALBUMIN 1.6*   INFECTIOUS  Recent Labs Lab 04/13/13 0448  04/13/13 1800 04/14/13 0400 04/15/13 0410 04/16/13 1010 04/18/13 0439  LATICACIDVEN  --   < > 1.6  --   --  2.5* 2.4*  PROCALCITON 5.82  --   --  4.31 2.61  --   --   < > = values in this interval not displayed. ENDOCRINE CBG (last 3)   Recent Labs  04/17/13 2029 04/18/13 0125 04/18/13 0423  GLUCAP 274* 271* 258*   IMAGING x48h  Dg Chest Port 1 View  04/17/2013   CLINICAL DATA:  Endotracheal tube placement  EXAM: PORTABLE CHEST - 1 VIEW  COMPARISON:  DG CHEST 1V PORT dated 04/16/2013; DG CHEST 1V PORT dated 04/14/2013; DG CHEST 1V PORT dated 04/13/2013; CT CHEST W/O CM dated 04/13/2013  FINDINGS: Grossly unchanged cardiac silhouette and mediastinal contours. Stable position of support apparatus. No definite pneumothorax. Grossly unchanged extensive bilateral heterogeneous airspace opacities with relative areas of confluence within the right suprahilar lung and left lower lung. Trace left-sided effusion is not excluded. No new discrete focal airspace opacities.  Pulmonary vasculature remains indistinct. Unchanged bones.  IMPRESSION: 1.  Stable positioning of support apparatus.  No pneumothorax. 2. Similar appearance of the chest with extensive bilateral heterogeneous airspace opacities with relative areas of consolidation within the right suprahilar and left lower lung, findings again worrisome for multifocal infection. No new discrete focal airspace opacities. 3. Suspected unchanged underlying pulmonary edema.   Electronically Signed   By: Simonne Come M.D.   On: 04/17/2013 07:34      ASSESSMENT / PLAN:  PULMONARY A: Acute Respiratory Failure - B infiltrates > ddx = ARDS, viral pneumonitis, bacterial, consider amiodarone or other non-infectious inflammatory process.  Note she has had a similar hospitalization 2-3 yrs ago (1/12). Consider COP, acute amiodarone rxn.  Favor viral or non-infectious etiology. ARDS  Mucus plug -  2/8 with resp arrest Left pneumothorax s/p chest tube - post CPR  P:   -SBts --> -Vent bundle -mucomyst & nebs -CT to 20 cm sxn -solumedrol  since 04/15/13 -drop to 40 q 12  CARDIOVASCULAR A:  Septic and Hypovolemic Shock - transiently required pressors.   Cardiac arrest 2/8 am due to ETT obstruction Hx HTN Hx CHF / ICM s/p Pacemaker - compensated.  EF 55-60% 2/10 AFib on amiodarone for years   P:  -hold oral amiodarone -hold zocor -monitor hemodynamics   RENAL A:   Acute Kidney Injury - in setting of arrest.   -mild rise in creat with lasix with hypernatremia  P:   - start free water but continue lasix -trend BMP -replace lytes as indicated    GASTROINTESTINAL A:   Nausea / Vomiting / Diarrhea - resolved Diarrhea ? tube feeds ? zosyn  P:   -follow pending stool cultures , c diff -PPI -TF per nutrition   HEMATOLOGIC A:   Leukocytosis - steroids initiated 2/12 DVT Prophylaxis  Thrombocytopenia -platelets returning clumped periodically  P:  -heparin for DVT proph -monitor  cbc   INFECTIOUS A:   Gastroenteritis, Diarrhea - resolved. R/O CAP vs Viral Respiratory Illness - PCT elevated but trending down (44->2.6), favors infectious cause. -ceftriaxone + azithro completed 2/9 RVP neg - off tamiflu   Fever - noted off abx 2/10.   Yeast UTI   04/17/13: Possible VAP but no cultures and PCT trending down. YEast in urine likely colonizer  P:   -stool O&P & PCR pending (unable to send due to formed stool) Dc clinda Dc vanc Continue zosyn x 24h, simplify if resp xc remains neg Send c diff   ENDOCRINE A:   Hypothyroidism   Gout  - with acute flare Hyperglycemia - due to steroids  P:   -continue synthroid -SSI -resistant scale   NEUROLOGIC A:   At Risk Anoxic Encephalopathy s/p CPR - CT head neg 2/11  04/1513: Following command intermittently during WUA  P:   -cont fent gtt, -  PRN versed -precedex gtt -Serial exams -daily WUA    GLOBAL: -04/15/13: ongoing discussions with family regarding tracheostomy  Family agreeable to trach if still not extubated by Monday 04/18/13  - 04/17/13: 40 minute family update at bedside about all details of trach,ltac and they are considering. Advised them to let team know 04/18/13. Futures traderCAre manager to discuss LTAC with family     The patient is critically ill with multiple organ systems failure and requires high complexity decision making for assessment and support, frequent evaluation and titration of therapies, application of advanced monitoring technologies and extensive interpretation of multiple databases.   Critical Care Time devoted to patient care services described in this note is  45  Minutes.  Cyril Mourningakesh Alva MD. Tonny BollmanFCCP. [facility] Pager [phone] If no response call [phone]   04/18/2013 8:40 AM

## 2013-04-18 NOTE — Plan of Care (Signed)
Problem: Phase I Progression Outcomes Goal: OOB as tolerated unless otherwise ordered Outcome: Progressing Pt extubated today, positioned in chair position (via bed), pt extremely deconditioned/weak.  Pt fatigues easily. Goal: Other Phase I Outcomes/Goals Outcome: Progressing Noted that pt showing signs of ICU psychosis when talking with family members.  Pt stating that she will die if she is not transferred to [facility] today, also stating that there are bugs crawling in her bed..  Pt reassured quite frequently throughout shift to calm fears and concerns.  Pt told and re-told the reason why she is in unit and how far she has come medically.  Pt appears to appreciate information provided and does calm fairly easily.  Much support and education provided to husband throughout shift.

## 2013-04-18 NOTE — Progress Notes (Signed)
Name: [name] MRN: [id number] DOB: 12-26-1932  ELECTRONIC ICU PHYSICIAN NOTE  Problem:  High wob s/p extubation   Intervention:  bipap trial   Sandrea Hughs 04/18/2013, 8:04 PM

## 2013-04-18 NOTE — Progress Notes (Signed)
Name: TEDRA SELZLER MRN: [id number] DOB: [birth year]  ELECTRONIC ICU PHYSICIAN NOTE  Problem:  Agitation/ not on home benzos ? icu psychosis ? Related to pain   Intervention:  Haldol/fentanyl IV   Sandrea Hughs 04/18/2013, 6:26 PM

## 2013-04-18 NOTE — Procedures (Signed)
Extubation Procedure Note  Patient Details:   Name: [name] DOB: 1932-11-09 MRN: [id number]   Airway Documentation:     Evaluation  O2 sats: stable throughout Complications: No apparent complications Patient did tolerate procedure well. Bilateral Breath Sounds: Clear Suctioning: Airway Yes  Ave Filter 04/18/2013, 10:59 AM

## 2013-04-18 NOTE — Progress Notes (Signed)
Palliative Medicine Team Progress Note  Ms. Beto has now been extubated and goals of care have been been consistently oriented toward full scope medical treatment and aggressive interventions if indicated and necessary with the goal of cure and recovery. Educational materials were provided to the family regarding advance directives. Please re-consult if her goals change or if we can assist her with any symptom management needs. Will sign off for now.  Anderson Malta, DO Palliative Medicine

## 2013-04-19 ENCOUNTER — Inpatient Hospital Stay (HOSPITAL_COMMUNITY): Payer: Medicare Other

## 2013-04-19 DIAGNOSIS — M79609 Pain in unspecified limb: Secondary | ICD-10-CM

## 2013-04-19 DIAGNOSIS — M7989 Other specified soft tissue disorders: Secondary | ICD-10-CM

## 2013-04-19 LAB — GLUCOSE, CAPILLARY
GLUCOSE-CAPILLARY: 196 mg/dL — AB (ref 70–99)
Glucose-Capillary: 165 mg/dL — ABNORMAL HIGH (ref 70–99)
Glucose-Capillary: 165 mg/dL — ABNORMAL HIGH (ref 70–99)
Glucose-Capillary: 168 mg/dL — ABNORMAL HIGH (ref 70–99)
Glucose-Capillary: 175 mg/dL — ABNORMAL HIGH (ref 70–99)
Glucose-Capillary: 177 mg/dL — ABNORMAL HIGH (ref 70–99)

## 2013-04-19 LAB — BLOOD GAS, ARTERIAL
Acid-Base Excess: 6.9 mmol/L — ABNORMAL HIGH (ref 0.0–2.0)
Bicarbonate: 29.9 mEq/L — ABNORMAL HIGH (ref 20.0–24.0)
Drawn by: 358491
FIO2: 0.4 %
Mode: POSITIVE
O2 SAT: 89.2 %
PATIENT TEMPERATURE: 38.4
PEEP: 5 cmH2O
Pressure support: 5 cmH2O
TCO2: 26.4 mmol/L (ref 0–100)
pCO2 arterial: 39.3 mmHg (ref 35.0–45.0)
pH, Arterial: 7.5 — ABNORMAL HIGH (ref 7.350–7.450)
pO2, Arterial: 61.6 mmHg — ABNORMAL LOW (ref 80.0–100.0)

## 2013-04-19 LAB — BASIC METABOLIC PANEL
BUN: 52 mg/dL — ABNORMAL HIGH (ref 6–23)
CHLORIDE: 112 meq/L (ref 96–112)
CO2: 31 mEq/L (ref 19–32)
Calcium: 8 mg/dL — ABNORMAL LOW (ref 8.4–10.5)
Creatinine, Ser: 1.45 mg/dL — ABNORMAL HIGH (ref 0.50–1.10)
GFR calc Af Amer: 39 mL/min — ABNORMAL LOW (ref >90)
GFR calc non Af Amer: 34 mL/min — ABNORMAL LOW (ref >90)
GLUCOSE: 191 mg/dL — AB (ref 70–99)
POTASSIUM: 3.3 meq/L — AB (ref 3.7–5.3)
SODIUM: 155 meq/L — AB (ref 137–147)

## 2013-04-19 LAB — CBC WITH DIFFERENTIAL/PLATELET
Basophils Absolute: 0 10*3/uL (ref 0.0–0.1)
Basophils Relative: 0 % (ref 0–1)
Eosinophils Absolute: 0 10*3/uL (ref 0.0–0.7)
Eosinophils Relative: 0 % (ref 0–5)
HCT: 34.7 % — ABNORMAL LOW (ref 36.0–46.0)
HEMOGLOBIN: 11.4 g/dL — AB (ref 12.0–15.0)
LYMPHS ABS: 1.6 10*3/uL (ref 0.7–4.0)
LYMPHS PCT: 4 % — AB (ref 12–46)
MCH: 30.6 pg (ref 26.0–34.0)
MCHC: 32.9 g/dL (ref 30.0–36.0)
MCV: 93.3 fL (ref 78.0–100.0)
MONOS PCT: 1 % — AB (ref 3–12)
Monocytes Absolute: 0.6 10*3/uL (ref 0.1–1.0)
NEUTROS PCT: 94 % — AB (ref 43–77)
Neutro Abs: 36 10*3/uL — ABNORMAL HIGH (ref 1.7–7.7)
PLATELETS: ADEQUATE 10*3/uL (ref 150–400)
RBC: 3.72 MIL/uL — AB (ref 3.87–5.11)
RDW: 15.8 % — ABNORMAL HIGH (ref 11.5–15.5)
WBC: 38.1 10*3/uL — AB (ref 4.0–10.5)

## 2013-04-19 LAB — CULTURE, BLOOD (ROUTINE X 2): Culture: NO GROWTH

## 2013-04-19 LAB — OVA AND PARASITE EXAMINATION: OVA AND PARASITES: NONE SEEN

## 2013-04-19 LAB — MAGNESIUM: MAGNESIUM: 2.6 mg/dL — AB (ref 1.5–2.5)

## 2013-04-19 LAB — PHOSPHORUS: Phosphorus: 4 mg/dL (ref 2.3–4.6)

## 2013-04-19 MED ORDER — VANCOMYCIN HCL 10 G IV SOLR
1250.0000 mg | INTRAVENOUS | Status: DC
  Administered 2013-04-20 – 2013-04-22 (×3): 1250 mg via INTRAVENOUS
  Filled 2013-04-19 (×3): qty 1250

## 2013-04-19 MED ORDER — DEXTROSE 5 % IV SOLN
INTRAVENOUS | Status: DC
  Administered 2013-04-19 – 2013-04-21 (×4): via INTRAVENOUS
  Administered 2013-04-22: 50 mL via INTRAVENOUS

## 2013-04-19 MED ORDER — SODIUM CHLORIDE 0.9 % IJ SOLN
10.0000 mL | Freq: Two times a day (BID) | INTRAMUSCULAR | Status: DC
  Administered 2013-04-19 – 2013-04-21 (×5): 10 mL
  Administered 2013-04-22: 20 mL

## 2013-04-19 MED ORDER — SODIUM CHLORIDE 0.9 % IJ SOLN: 10.0000 mL | INTRAMUSCULAR | Status: DC | PRN

## 2013-04-19 MED ORDER — METHYLPREDNISOLONE SODIUM SUCC 40 MG IJ SOLR
40.0000 mg | INTRAMUSCULAR | Status: DC
  Administered 2013-04-20 – 2013-04-22 (×3): 40 mg via INTRAVENOUS
  Filled 2013-04-19 (×4): qty 1

## 2013-04-19 MED ORDER — VANCOMYCIN HCL 10 G IV SOLR
2000.0000 mg | Freq: Once | INTRAVENOUS | Status: AC
  Administered 2013-04-19: 2000 mg via INTRAVENOUS
  Filled 2013-04-19: qty 2000

## 2013-04-19 MED ORDER — FUROSEMIDE 10 MG/ML IJ SOLN
40.0000 mg | Freq: Two times a day (BID) | INTRAMUSCULAR | Status: DC
  Administered 2013-04-19 – 2013-04-21 (×5): 40 mg via INTRAVENOUS
  Filled 2013-04-19 (×8): qty 4

## 2013-04-19 NOTE — Progress Notes (Signed)
NUTRITION FOLLOW UP  Intervention:   -Diet texture per SLP evaluation -Diet advancement per MD -Will continue to monitor   Nutrition Dx:   Inadequate oral intake related to inability to eat as evidenced by NPO - ongoing   Goal:   Pt to meet >/= 90% of their estimated nutrition needs     Monitor:   Swallow profile, diet order, total protein/energy intake, weights, labs,   Assessment:   Pt started having vomiting 04/03/13. She thought she had food poisoning and continued to vomiting throughout the day. She vomited at least a dozen times. She stopped vomiting around 10 am but then started having diarrhea, multiple episodes. As the day progressed she started feeling short of breath. She also developed chills and fever. Slight cough for the last few days. She did start to feel lightheaded. Hx of stage 3 CKD, HTN, hyperlipidemia, and hypothyroidism. Found to have acute hypoxemic respiratory failure, sepsis, pneumonia, and acute on chronic renal failure.   2/5 - Pt intubated yesterday for acute respiratory distress and placed on ARDS protocol. Pt alone in room, wearing mittens. Suspect pt with inadequate oral intake since 2/1 due to vomiting/diarrhea.   2/6 - Remains intubated. Started on TF via OGT of Oxepa at 46m/hr. Met with RN who reports pt tolerating TF well with only 569mresiduals. Phosphorus low, potassium and magnesium WNL. Discussed with pharmacist. Pt getting sodium phosphate IV replacement today, expect phosphorus will improve with replacement. Pt's weight up 20 pounds since admission, getting Lasix today.   2/9 - Reviewed events since last RD note. Pt had code blue 2/8, CPR performed, pt with improved oxygenation. Pt extubated and reintubated 2/8. Had chest tube inserted 2/8 for pneumothorax. Remains intubated. Phosphorus and magnesium WNL, potassium remains low - getting liquid replacement. Per conversation with RN, pt tolerating TF well with no residuals. No plans for extubation  today. Pt's weight up 21 pounds since admission.   2/12 - Palliative care following, met with pt yesterday - noted plans for full code and "full force ahead". Per conversation with RN, pt with only 1019mF residuals this morning and has been tolerating TF well. Remains intubated. Pt's weight up 31 pounds since admission. Is getting Lasix. Refeeding labs WNL.   Patient is currently intubated on ventilator support.  MV: 11.4 L/min Temp (24hrs), Avg:98.8 F (37.1 C), Min:98.6 F (37 C), Max:99.5 F (37.5 C)  Propofol: off TF: Oxepa to 70m17m via OGT with Prostat 30ml56m which provides 1740 calories, 105g protein, 754ml 32m water and meets 97% estimated calorie needs and 100% estimated protein needs.   2/17: -Pt extubated on 2/16 -Oxepa and Pro-stat d/c on 2/16 -Pt remains Bipap dependent and poorly arousable per SLP. SLP plan to reassess pt at later date -Blood glucose elevated r/t steroids -Continues with ARF. MD noted pt with increase in Crt d/t lasix. Phos WNL, Mag slightly elevated. Low K, is being repleted. -Wt continues to trend up -Pt w/loose stools, c.diff neg   Potassium  Date/Time Value Ref Range Status  04/19/2013  7:52 AM 3.3* 3.7 - 5.3 mEq/L Final  04/18/2013  4:39 AM 3.2* 3.7 - 5.3 mEq/L Final  04/17/2013  4:20 AM 3.6* 3.7 - 5.3 mEq/L Final    Phosphorus  Date/Time Value Ref Range Status  04/19/2013  7:52 AM 4.0  2.3 - 4.6 mg/dL Final  04/18/2013  4:39 AM 4.4  2.3 - 4.6 mg/dL Final  04/17/2013  4:20 AM 4.2  2.3 - 4.6 mg/dL Final  Magnesium  Date/Time Value Ref Range Status  04/19/2013  7:52 AM 2.6* 1.5 - 2.5 mg/dL Final  04/18/2013  4:39 AM 2.3  1.5 - 2.5 mg/dL Final  04/17/2013  4:20 AM 2.5  1.5 - 2.5 mg/dL Final     Height: Ht Readings from Last 1 Encounters:  04/16/13 _0  (1.6 m)    Weight Status:   Wt Readings from Last 1 Encounters:  04/18/13 208 lb 5.4 oz (94.5 kg)  04/14/13 198 lbs Admit wt:        167 lb 5.3 oz (75.9 kg)  Net I/Os:  +7.7L  Re-estimated needs:  Kcal: 1786 Protein: 80-105g Fluid: >1.6L/day   Skin: +2 generalized edema, +2 RUE, LUE edema, +3 RLE, LLE edema   Diet Order: NPO   Intake/Output Summary (Last 24 hours) at 04/19/13 1612 Last data filed at 04/19/13 1500  Gross per 24 hour  Intake 1423.33 ml  Output   2695 ml  Net -1271.67 ml    Last BM: 2/17   Labs:   Recent Labs Lab 04/17/13 0420 04/18/13 0439 04/19/13 0752  NA 152* 152* 155*  K 3.6* 3.2* 3.3*  CL 110 110 112  CO2 _1 BUN 57* 64* 52*  CREATININE 1.42* 1.52* 1.45*  CALCIUM 8.0* 7.6* 8.0*  MG 2.5 2.3 2.6*  PHOS 4.2 4.4 4.0  GLUCOSE 225* 262* 191*    CBG (last 3)   Recent Labs  04/19/13 0450 04/19/13 0743 04/19/13 1150  GLUCAP 196* 165* 177*    Scheduled Meds: . aspirin  325 mg Per Tube Daily  . chlorhexidine  15 mL Mouth Rinse BID  . furosemide  40 mg Intravenous BID  . heparin  5,000 Units Subcutaneous 3 times per day  . insulin aspart  0-15 Units Subcutaneous 6 times per day  . ipratropium-albuterol  3 mL Nebulization Q6H  . levothyroxine  37.5 mcg Intravenous QAC breakfast  . [START ON 04/20/2013] methylPREDNISolone (SOLU-MEDROL) injection  40 mg Intravenous Q24H  . pantoprazole (PROTONIX) IV  40 mg Intravenous Daily  . piperacillin-tazobactam (ZOSYN)  IV  3.375 g Intravenous Q8H  . sodium chloride  500 mL Intravenous Once  . [START ON 04/20/2013] vancomycin  1,250 mg Intravenous Q24H    Continuous Infusions: . sodium chloride 1,000 mL (04/17/13 1900)  . dextrose 50 mL/hr at 04/19/13 [place] Clinical Dietitian [phone]

## 2013-04-19 NOTE — Progress Notes (Signed)
:    [id number] Earlene Plater, RN, BSN, CCM, [phone] Chart reviewed for update of needs and condition. benefits request for latc placement verses snf vs cir. Both area ltach providers notified of poss. needs for services. patient is on bipap,  Fi02 at 70% and peep at 6cm.

## 2013-04-19 NOTE — Progress Notes (Signed)
ANTIBIOTIC CONSULT NOTE  Pharmacy Consult:  Zosyn, Vancomycin, antibiotic renal dose adjustment Indication: rule out sepsis  Labs:  Recent Labs  04/17/13 0420 04/18/13 0439 04/19/13 0752  WBC 28.2* 27.0* 38.1*  HGB 9.6* 9.3* 11.4*  PLT PLATELET CLUMPS NOTED ON SMEAR, COUNT APPEARS ADEQUATE PLATELET CLUMPS NOTED ON SMEAR, COUNT APPEARS ADEQUATE PLATELET CLUMPS NOTED ON SMEAR, COUNT APPEARS ADEQUATE  CREATININE 1.42* 1.52* 1.45*   Estimated Creatinine Clearance: 34.9 ml/min (by C-G formula based on Cr of 1.45).  Micro: 2/3 MRSA PCR: negative 2/2 blood x2: NGF 2/11 blood x1: ngtd 2/11 urine: yeast 2/2 influenza: neg 2/3 sputum: normal flora 2/3 Resp virus panel: neg 2/4 trach asp: NGF 2/6 Sputum Cxt: NGF 2/11: Resp culture: few candida albicans 2/11 Blood x 2: NGF 2/11: Urine: >100K yeast (CCM thinks colonization) 2/16 Cdiff: negative    Anti-infectives: 2/2 >>zmax >> 2/8 2/2 >>rocephin >> 2/8 2/2>>tamiflu >>2/4 2/11 >> clinda >> 2/15 2/11 >> vanc >> 2/15 2/11 >> zosyn >> 2/12 >> Diflucan (yeast in urine) >> 2/14 2/16 >> Metronidazole (MD) >> 2/17 2/17 >> Vanc >>   Assessment: [name] admitted 2/2 with respiratory failure and probable CAP.  Completed 7 days of azithromycin and Ceftriaxone on 2/8.  On 2/8, suffered mucus plugging with ETT obstruction with subsequent cardiac arrest.  Broad spectrum antibiotics (Zosyn, Vanc, Clinda) were started 2/11 for coverage of possible sepsis, but were narrowed to Zosyn alone on 2/15 with negative cultures.  Pharmacy is consulted to resume vancomycin dosing 2/17.    Tmax: 101.1  WBCs: 38.1  AoCKD: SCr 1.45 (remains elevated) with CrCl ~ 35 ml/min  Goal of Therapy:  Vancomycin trough level 15-20 mcg/ml Appropriate abx dosing, eradication of infection.   Plan:   Continue Zosyn 3.375g IV Q8H infused over 4hrs. Vancomycin 2g IV loading dose, then 1250mg  IV q24h. Measure Vanc trough at steady state. Follow up renal fxn and  culture results.   Lynann Beaver PharmD, BCPS Pager [phone] 04/19/2013 10:03 AM

## 2013-04-19 NOTE — Progress Notes (Signed)
PT Cancellation Note  Patient Details Name: [name] MRN: [id number] DOB: 02/21/1933   Cancelled Treatment:    Reason Eval/Treat Not Completed: Medical issues which prohibited therapy (pt is  NRB mask. will check on 2/18.)   Rada Hay 04/19/2013, 3:29 PM Blanchard Kelch PT [phone]

## 2013-04-19 NOTE — Progress Notes (Addendum)
Name: [name] MRN: [id number] DOB: 08-09-1932   LOS 15 days   ADMISSION DATE:  04/04/2013 CONSULTATION DATE:  04/05/13  REFERRING MD :  Dr. Vanessa Barbara  PRIMARY SERVICE: TRH-->PCCM  CHIEF COMPLAINT:  Hypotension   BRIEF PATIENT DESCRIPTION: 80 y/o F admitted 2/2 with norovirus gastroenteritis,respiratory failure, hypovolemic and presumed septic shock -source unclear.  Course complicated by ARDS.  2/8 am suffered mucus plugging with ETT obstruction with subsequent cardiac arrest.  Required CPR x 9 mins, improved with re-intubation & left pneumothorax decompression  SIGNIFICANT EVENTS / STUDIES:  2/03 - Admit with fever, SOB, vomiting, diarrhea (concern for food poisoning) & CXR concerning for interstitial pneumonitis 2/03 - 2 d echo>>>Ef 45% grade 1 diastolic dysfunction  2/08 - resp arrest with mucus plugging, ETT exchanged 2/09 - changed to PCV overnight 2/10 - febrile overnight, hypotension requiring pressors 2/11 - CT Chest/ABD/Pelvis>>ARDS, R pleural effusion, L CT, Air in sq of anterior abd with marked inflammation 2/11 - CT Head>>neg 2/14 - fever curve down but wbc up 33K . Still +4L in 7 days but even balance x 3 dauys. Resp secretions improved but cxr stil looks bad 2/16 - extubated, required bipap post extubation   LINES / TUBES: L IJ TLC 2/3>>>2/17 OTT2/4>>>2/8, 2/8 (mucus plug)>>>2/16 A line 2/4>>2/14 OGT 2/4>>2/16 PICC 2/17>>>  CULTURES: MRSA PCR 2/3>>> negative BCx2 2/3>>>neg Sputum 2/3>>> normal flora UA 2/3>>> negative nit and LE Resp Viral Panel 2/3>>>negative 2/4 tracheal aspirate>>normal flora Stool culture, o&p 2/5>>> Stool PCR 04/11/13>> .................. BCx2 2/11>>>ng Sputum 2/11>>>few candidate UA 2/11>>>many yeast UC 2/11>>>greater 100k yeast   ANTIBIOTICS: Rocephin 2/3>>>2/9 Azithro 2/3>>>2/9 Tamiflu 2/3>>>2/4 ............ Clinda 2/12>>>2/15 Vanco 2/12>>>2/15 Zosyn 2/12>>> Diflucan 2/12 (yeas in urine) >>>2/14    SUBJECTIVE:   Required bipap overnight, C-diff neg but ongoing loose stool Afebrile Good UO with lasix Confusion noted  VITAL SIGNS: Temp:  [98.6 F (37 C)-101.1 F (38.4 C)] 99.1 F (37.3 C) (02/17 0900) Pulse Rate:  [79-102] 89 (02/17 0900) Resp:  [20-45] 27 (02/17 0900) BP: (124-200)/(27-137) 179/42 mmHg (02/17 0900) SpO2:  [85 %-98 %] 98 % (02/17 0900) FiO2 (%):  [55 %-100 %] 70 % (02/17 0806)  HEMODYNAMICS:    VENTILATOR SETTINGS: Vent Mode:  [-] Other (Comment) FiO2 (%):  [55 %-100 %] 70 % Set Rate:  [12 bmp] 12 bmp PEEP:  [6 cmH20] 6 cmH20  INTAKE / OUTPUT: Intake/Output     02/16 0701 - 02/17 0700 02/17 0701 - 02/18 0700   I.V. (mL/kg) 611.2 (6.5) 20 (0.2)   NG/GT 0    IV Piggyback 400    Total Intake(mL/kg) 1011.2 (10.7) 20 (0.2)   Urine (mL/kg/hr) 2700 (1.2) 150 (0.6)   Chest Tube 620 (0.3)    Total Output 3320 150   Net -2308.9 -130        Stool Occurrence 1 x      PHYSICAL EXAMINATION: General: elderly female, looks deconditioned and critically ill in ICU bed. Neuro:   RASS 0, alert at times,follows commands, very weak all 4Es  HEENT:  Mm pink/dry, no jvd, OETT Cardiovascular:  s1s2 rrr, paced on monitor Lungs:  resps even non labored on vent, diminished bilaterally, left chest tube - without air leak Abdomen:  Round/soft, bsx4 active Musculoskeletal:  No acute deformities  Skin:  Warm/dry, generalized +++ edema .    LABS:  PULMONARY  Recent Labs Lab 04/13/13 1455 04/14/13 1121 04/18/13 0930  PHART 7.445 7.405 7.500*  PCO2ART 39.5 44.6 39.3  PO2ART 67.2* 66.2*  61.6*  HCO3 26.2* 26.8* 29.9*  TCO2 23.5 23.7 26.4  O2SAT 91.9 90.0 89.2   CBC  Recent Labs Lab 04/17/13 0420 04/18/13 0439 04/19/13 0752  HGB 9.6* 9.3* 11.4*  HCT 30.8* 29.5* 34.7*  WBC 28.2* 27.0* 38.1*  PLT PLATELET CLUMPS NOTED ON SMEAR, COUNT APPEARS ADEQUATE PLATELET CLUMPS NOTED ON SMEAR, COUNT APPEARS ADEQUATE PLATELET CLUMPS NOTED ON SMEAR, COUNT APPEARS ADEQUATE   CARDIAC     Recent Labs Lab 04/16/13 0955 04/16/13 1638 04/17/13 0025  TROPONINI <0.30 <0.30 <0.30    Recent Labs Lab 04/13/13 0447 04/15/13 0410 04/16/13 0430  PROBNP 2694.0* 2271.0* 3345.0*   CHEMISTRY  Recent Labs Lab 04/15/13 0410 04/16/13 0438 04/17/13 0420 04/18/13 0439 04/19/13 0752  NA 146 145 152* 152* 155*  K 4.3 3.3* 3.6* 3.2* 3.3*  CL 108 104 110 110 112  CO2 27 28 30 31 31   GLUCOSE 263* 242* 225* 262* 191*  BUN 39* 53* 57* 64* 52*  CREATININE 0.96 1.20* 1.42* 1.52* 1.45*  CALCIUM 8.0* 8.1* 8.0* 7.6* 8.0*  MG 2.4 2.4 2.5 2.3 2.6*  PHOS 3.9 4.2 4.2 4.4 4.0   Estimated Creatinine Clearance: 34.9 ml/min (by C-G formula based on Cr of 1.45).  LIVER  Recent Labs Lab 04/16/13 0955  AST 20  ALT 22  ALKPHOS 53  BILITOT 0.2*  PROT 5.6*  ALBUMIN 1.6*   INFECTIOUS  Recent Labs Lab 04/13/13 0448  04/13/13 1800 04/14/13 0400 04/15/13 0410 04/16/13 1010 04/18/13 0439  LATICACIDVEN  --   < > 1.6  --   --  2.5* 2.4*  PROCALCITON 5.82  --   --  4.31 2.61  --   --   < > = values in this interval not displayed. ENDOCRINE CBG (last 3)   Recent Labs  04/19/13 04/19/13 0450 04/19/13 0743  GLUCAP 168* 196* 165*   IMAGING x48h  Dg Chest Port 1 View  04/19/2013   CLINICAL DATA:  Chest tube.  EXAM: PORTABLE CHEST - 1 VIEW  COMPARISON:  04/17/2013  FINDINGS: Left pigtail catheter remains in place within the left hemi thorax, unchanged. No pneumothorax. Interval extubation. Left pacer in right central line remain in place, unchanged. Interval removal of NG tube.  Bilateral airspace opacities, slightly improved in the right upper lobe. Otherwise no change. No effusions. No acute bony abnormality.  IMPRESSION: Diffuse bilateral airspace disease, slightly improved in the right upper lobe.  Post extubation.   Electronically Signed   By: Charlett NoseKevin  Dover M.D.   On: 04/19/2013 06:08      ASSESSMENT / PLAN:  PULMONARY A: Acute Respiratory Failure - B infiltrates > ddx  = ARDS, viral pneumonitis, bacterial, consider amiodarone or other non-infectious inflammatory process.  Note she has had a similar hospitalization 2-3 yrs ago (1/12). Consider COP, acute amiodarone rxn.  Favor viral or non-infectious etiology. ARDS  Mucus plug - 2/8 with resp arrest Left pneumothorax s/p chest tube - post CPR  P:   -continue nebs -water seal chest tube -d/c mucomyst -solumedrol started 2/13, decrease to 40 q 24 2/17 -BiPAP PRN -titrate O2 for sats >88%  CARDIOVASCULAR A:  Septic and Hypovolemic Shock - transiently required pressors.   Cardiac arrest 2/8 am due to ETT obstruction Hx HTN Hx CHF / ICM s/p Pacemaker - compensated.  EF 55-60% 2/10 AFib on amiodarone for years  P:  -hold oral amiodarone -hold zocor -monitor hemodynamics -place TED hose   RENAL A:   Acute Kidney Injury - in setting  of arrest.  -mild rise in creat with lasix with hypernatremia Hypernatremia  P:   -D5w @ 50 & continue lasix -trend BMP -replace lytes as indicated    GASTROINTESTINAL A:   Nausea / Vomiting / Diarrhea - resolved Diarrhea - ? tube feeds ? Zosyn. C-Diff neg Norovirus - POSITIVE 2/14 P:   -PPI -TF per nutrition  -follow for O&P culture  HEMATOLOGIC A:   Leukocytosis - steroids initiated 2/12 DVT Prophylaxis  Thrombocytopenia - platelets returning clumped periodically  P:  -heparin for DVT proph -monitor cbc -chk plts in citrate tube in am   INFECTIOUS A:   Gastroenteritis, Diarrhea.  NOROVIRUS + R/O CAP vs Viral Respiratory Illness - PCT elevated but trending down (44->2.6), favors infectious cause. -ceftriaxone + azithro completed 2/9 RVP neg - off tamiflu   Fever - noted off abx 2/10, leucocytosis ? Steroids ? Line sepsis Yeast UTI   P:   -stool O&P pending  -Continue zosyn x 24h, simplify if resp xc remains neg -reculture 2/17 -add vanco back 2/17 (fever ? Line sepsis) -contact precautions    ENDOCRINE A:   Hypothyroidism   Gout  -  with acute flare Hyperglycemia - due to steroids, expect to worsen with d5w drip  P:   -continue synthroid -SSI, resistant scale   NEUROLOGIC A:   Delerium - CT head neg 2/11 Critical illness polyneuropathy P:   -PT consult -push mobilization  -CIR vs LTAC eventually   GLOBAL: -Severe deconditioning in setting of critical illness. Push PT efforts.   Canary Brim, NP-C [facility] Pgr: [phone] or [phone]   The patient is critically ill with multiple organ systems failure and requires high complexity decision making for assessment and support, frequent evaluation and titration of therapies, application of advanced monitoring technologies and extensive interpretation of multiple databases.   Critical Care Time devoted to patient care services described in this note is 45 Minutes.  ALVA,RAKESH V. [phone]  04/19/2013 9:45 AM

## 2013-04-19 NOTE — Progress Notes (Signed)
*  Preliminary Results* Bilateral lower extremity venous duplex completed. Study was very technically limited due to bilateral lower extremity swelling. Visualized veins of bilateral lower extremities are negative for deep vein thrombosis. There is no evidence of Baker's cyst bilaterally.  04/19/2013 10:22 AM  Gertie Fey, RVT, RDCS, RDMS

## 2013-04-19 NOTE — Progress Notes (Signed)
Peripherally Inserted Central Catheter/Midline Placement  The IV Nurse has discussed with the patient and/or persons authorized to consent for the patient, the purpose of this procedure and the potential benefits and risks involved with this procedure.  The benefits include less needle sticks, lab draws from the catheter and patient may be discharged home with the catheter.  Risks include, but not limited to, infection, bleeding, blood clot (thrombus formation), and puncture of an artery; nerve damage and irregular heat beat.  Alternatives to this procedure were also discussed.  PICC/Midline Placement Documentation  PICC / Midline Double Lumen 04/19/13 PICC Left Basilic 43 cm 13 cm (Active)  Indication for Insertion or Continuance of Line Prolonged intravenous therapies 04/19/2013  4:00 PM  Exposed Catheter (cm) 13 cm 04/19/2013  4:00 PM  Dressing Change Due 04/26/13 04/19/2013  4:00 PM    Consent signed per husband on 04/18/13   Stacie Glaze Horton 04/19/2013, 4:35 PM

## 2013-04-19 NOTE — Progress Notes (Signed)
Inpatient Diabetes Program Recommendations  AACE/ADA: New Consensus Statement on Inpatient Glycemic Control (2013)  Target Ranges:  Prepandial:   less than 140 mg/dL      Peak postprandial:   less than 180 mg/dL (1-2 hours)      Critically ill patients:  140 - 180 mg/dL   Reason for Visit: Hyperglycemia  Diabetes history: None Outpatient Diabetes medications: None Current orders for Inpatient glycemic control: Novolog moderate Q4H  Inpatient Diabetes Program Recommendations HgbA1C: Check HgbA1C to assess glycemic control prior to hospitalization Recommend ICU Hyperglycemia Protocol   Note: Will continue to follow for glycemic control. Thank you. Ailene Ards, RD, LDN, CDE Inpatient Diabetes Coordinator [phone]

## 2013-04-19 NOTE — Progress Notes (Signed)
eLink Physician-Brief Progress Note Patient Name: [name] DOB: 11/09/1932 MRN: [id number]  Date of Service  04/19/2013   HPI/Events of Note   PICC placed but tip appears to be in subclavian vein  eICU Interventions  IR to reposition 2/18 AM, order placed   Intervention Category Minor Interventions: Routine modifications to care plan (e.g. PRN medications for pain, fever)  Dalaina Tates 04/19/2013, 5:14 PM

## 2013-04-19 NOTE — Progress Notes (Signed)
Attempted left upper arm PICC x1, unable to advance PICC, met resistance with 13 cm remaining to be advanced. Good blood return, flushed easily with NS 10 ml. Left PICC intact and recommend that IR exchange or advance PICC. PICC was placed on Pacemaker side secondary to veins to small in right arm. MD and RN aware.

## 2013-04-19 NOTE — Progress Notes (Signed)
SLP Cancellation Note  Patient Details Name: TRANIYAH CHRISTINE MRN: [id number] DOB: 1932-10-05   Cancelled treatment:        Spoke with RN re: pt status.  Pt is currently requiring BIPAP, and is poorly arousable.  Will defer BSE for today and recheck for appropriateness next date.  Celia B. Murvin [facility], CCC-SLP [phone]  Leigh Aurora 04/19/2013, 10:18 AM

## 2013-04-20 ENCOUNTER — Inpatient Hospital Stay (HOSPITAL_COMMUNITY): Payer: Medicare Other

## 2013-04-20 DIAGNOSIS — I4891 Unspecified atrial fibrillation: Secondary | ICD-10-CM

## 2013-04-20 LAB — BLOOD GAS, ARTERIAL
ACID-BASE EXCESS: 8.6 mmol/L — AB (ref 0.0–2.0)
Bicarbonate: 31.2 mEq/L — ABNORMAL HIGH (ref 20.0–24.0)
Drawn by: 295031
FIO2: 0.6 %
O2 SAT: 93.7 %
PEEP: 5 cmH2O
Patient temperature: 98.6
RATE: 14 resp/min
TCO2: 28 mmol/L (ref 0–100)
VT: 500 mL
pCO2 arterial: 34.4 mmHg — ABNORMAL LOW (ref 35.0–45.0)
pH, Arterial: 7.565 — ABNORMAL HIGH (ref 7.350–7.450)
pO2, Arterial: 68.4 mmHg — ABNORMAL LOW (ref 80.0–100.0)

## 2013-04-20 LAB — BASIC METABOLIC PANEL
BUN: 43 mg/dL — ABNORMAL HIGH (ref 6–23)
BUN: 41 mg/dL — AB (ref 6–23)
CO2: 30 mEq/L (ref 19–32)
CO2: 30 mEq/L (ref 19–32)
Calcium: 7.3 mg/dL — ABNORMAL LOW (ref 8.4–10.5)
Calcium: 7.3 mg/dL — ABNORMAL LOW (ref 8.4–10.5)
Chloride: 112 mEq/L (ref 96–112)
Chloride: 108 mEq/L (ref 96–112)
Creatinine, Ser: 1.36 mg/dL — ABNORMAL HIGH (ref 0.50–1.10)
Creatinine, Ser: 1.34 mg/dL — ABNORMAL HIGH (ref 0.50–1.10)
GFR calc non Af Amer: 37 mL/min — ABNORMAL LOW (ref >90)
GFR, EST AFRICAN AMERICAN: 42 mL/min — AB (ref >90)
GFR, EST AFRICAN AMERICAN: 43 mL/min — AB (ref >90)
GFR, EST NON AFRICAN AMERICAN: 36 mL/min — AB (ref >90)
Glucose, Bld: 177 mg/dL — ABNORMAL HIGH (ref 70–99)
Glucose, Bld: 234 mg/dL — ABNORMAL HIGH (ref 70–99)
POTASSIUM: 2.9 meq/L — AB (ref 3.7–5.3)
POTASSIUM: 3.1 meq/L — AB (ref 3.7–5.3)
SODIUM: 156 meq/L — AB (ref 137–147)
Sodium: 150 mEq/L — ABNORMAL HIGH (ref 137–147)

## 2013-04-20 LAB — GLUCOSE, CAPILLARY
GLUCOSE-CAPILLARY: 153 mg/dL — AB (ref 70–99)
GLUCOSE-CAPILLARY: 133 mg/dL — AB (ref 70–99)
GLUCOSE-CAPILLARY: 152 mg/dL — AB (ref 70–99)
Glucose-Capillary: 170 mg/dL — ABNORMAL HIGH (ref 70–99)
Glucose-Capillary: 213 mg/dL — ABNORMAL HIGH (ref 70–99)
Glucose-Capillary: 204 mg/dL — ABNORMAL HIGH (ref 70–99)

## 2013-04-20 LAB — CBC WITH DIFFERENTIAL/PLATELET
Basophils Absolute: 0 10*3/uL (ref 0.0–0.1)
Basophils Relative: 0 % (ref 0–1)
EOS ABS: 0 10*3/uL (ref 0.0–0.7)
EOS PCT: 0 % (ref 0–5)
HCT: 33.2 % — ABNORMAL LOW (ref 36.0–46.0)
HEMOGLOBIN: 10.5 g/dL — AB (ref 12.0–15.0)
LYMPHS ABS: 0.5 10*3/uL — AB (ref 0.7–4.0)
Lymphocytes Relative: 2 % — ABNORMAL LOW (ref 12–46)
MCH: 30 pg (ref 26.0–34.0)
MCHC: 31.6 g/dL (ref 30.0–36.0)
MCV: 94.9 fL (ref 78.0–100.0)
MONO ABS: 0.4 10*3/uL (ref 0.1–1.0)
Monocytes Relative: 2 % — ABNORMAL LOW (ref 3–12)
NEUTROS PCT: 96 % — AB (ref 43–77)
Neutro Abs: 20 10*3/uL — ABNORMAL HIGH (ref 1.7–7.7)
Platelets: ADEQUATE 10*3/uL (ref 150–400)
RBC: 3.5 MIL/uL — ABNORMAL LOW (ref 3.87–5.11)
RDW: 16.2 % — ABNORMAL HIGH (ref 11.5–15.5)
SMEAR REVIEW: ADEQUATE
WBC: 20.9 10*3/uL — AB (ref 4.0–10.5)

## 2013-04-20 LAB — PROTIME-INR
INR: 1.3 (ref 0.00–1.49)
PROTHROMBIN TIME: 15.9 s — AB (ref 11.6–15.2)

## 2013-04-20 LAB — MAGNESIUM: Magnesium: 2.3 mg/dL (ref 1.5–2.5)

## 2013-04-20 LAB — PHOSPHORUS: Phosphorus: 4.2 mg/dL (ref 2.3–4.6)

## 2013-04-20 LAB — TRIGLYCERIDES: Triglycerides: 186 mg/dL — ABNORMAL HIGH (ref <150)

## 2013-04-20 MED ORDER — PROPOFOL 10 MG/ML IV EMUL
5.0000 ug/kg/min | INTRAVENOUS | Status: DC
  Filled 2013-04-20: qty 100

## 2013-04-20 MED ORDER — FENTANYL CITRATE 0.05 MG/ML IJ SOLN: 50.0000 ug | INTRAMUSCULAR | Status: DC | PRN

## 2013-04-20 MED ORDER — PROPOFOL 10 MG/ML IV EMUL: 5.0000 ug/kg/min | INTRAVENOUS | Status: DC

## 2013-04-20 MED ORDER — ADULT MULTIVITAMIN LIQUID CH
5.0000 mL | Freq: Every day | ORAL | Status: DC
  Administered 2013-04-21 – 2013-04-22 (×2): 5 mL
  Filled 2013-04-20 (×2): qty 5

## 2013-04-20 MED ORDER — FENTANYL CITRATE 0.05 MG/ML IJ SOLN
INTRAMUSCULAR | Status: AC
  Administered 2013-04-20: 200 ug
  Filled 2013-04-20: qty 4

## 2013-04-20 MED ORDER — FENTANYL CITRATE 0.05 MG/ML IJ SOLN
200.0000 ug | Freq: Once | INTRAMUSCULAR | Status: AC
  Administered 2013-04-20: 100 ug via INTRAVENOUS
  Filled 2013-04-20: qty 4

## 2013-04-20 MED ORDER — ETOMIDATE 2 MG/ML IV SOLN
20.0000 mg | Freq: Once | INTRAVENOUS | Status: AC
  Administered 2013-04-20: 20 mg via INTRAVENOUS
  Filled 2013-04-20: qty 10

## 2013-04-20 MED ORDER — SODIUM CHLORIDE 0.9 % IV SOLN: 0.0000 ug/h | INTRAVENOUS | Status: DC

## 2013-04-20 MED ORDER — MIDAZOLAM HCL 2 MG/2ML IJ SOLN
INTRAMUSCULAR | Status: AC
  Filled 2013-04-20: qty 2

## 2013-04-20 MED ORDER — FREE WATER
300.0000 mL | Freq: Four times a day (QID) | Status: DC
  Administered 2013-04-20 – 2013-04-22 (×7): 300 mL

## 2013-04-20 MED ORDER — FENTANYL CITRATE 0.05 MG/ML IJ SOLN: 25.0000 ug | INTRAMUSCULAR | Status: AC | PRN

## 2013-04-20 MED ORDER — POTASSIUM CHLORIDE 10 MEQ/100ML IV SOLN
10.0000 meq | INTRAVENOUS | Status: AC
  Administered 2013-04-20 (×4): 10 meq via INTRAVENOUS
  Filled 2013-04-20 (×4): qty 100

## 2013-04-20 MED ORDER — MIDAZOLAM HCL 2 MG/2ML IJ SOLN
2.0000 mg | Freq: Once | INTRAMUSCULAR | Status: AC
  Administered 2013-04-20: 2 mg via INTRAVENOUS

## 2013-04-20 MED ORDER — VECURONIUM BROMIDE 10 MG IV SOLR
10.0000 mg | Freq: Once | INTRAVENOUS | Status: AC
  Administered 2013-04-20: 10 mg via INTRAVENOUS
  Filled 2013-04-20 (×2): qty 10

## 2013-04-20 MED ORDER — OXEPA PO LIQD
1000.0000 mL | ORAL | Status: DC
  Filled 2013-04-20: qty 1000

## 2013-04-20 MED ORDER — ETOMIDATE 2 MG/ML IV SOLN
40.0000 mg | Freq: Once | INTRAVENOUS | Status: AC
  Administered 2013-04-20: 20 mg via INTRAVENOUS
  Filled 2013-04-20: qty 20

## 2013-04-20 MED ORDER — PROPOFOL 10 MG/ML IV EMUL
0.0000 ug/kg/min | INTRAVENOUS | Status: DC
  Administered 2013-04-20: 20 ug/kg/min via INTRAVENOUS
  Administered 2013-04-20 – 2013-04-21 (×3): 30 ug/kg/min via INTRAVENOUS
  Filled 2013-04-20 (×2): qty 100

## 2013-04-20 MED ORDER — MIDAZOLAM HCL 2 MG/2ML IJ SOLN
4.0000 mg | Freq: Once | INTRAMUSCULAR | Status: DC
  Filled 2013-04-20: qty 4

## 2013-04-20 MED ORDER — PRO-STAT SUGAR FREE PO LIQD
30.0000 mL | Freq: Three times a day (TID) | ORAL | Status: DC
  Administered 2013-04-20 – 2013-04-22 (×6): 30 mL
  Filled 2013-04-20 (×8): qty 30

## 2013-04-20 MED ORDER — OXEPA PO LIQD
1000.0000 mL | ORAL | Status: DC
  Administered 2013-04-20: 1000 mL
  Filled 2013-04-20 (×2): qty 1000

## 2013-04-20 MED ORDER — FENTANYL CITRATE 0.05 MG/ML IJ SOLN
50.0000 ug | INTRAMUSCULAR | Status: DC | PRN
  Administered 2013-04-20 – 2013-04-21 (×9): 100 ug via INTRAVENOUS
  Filled 2013-04-20 (×9): qty 2

## 2013-04-20 NOTE — Progress Notes (Signed)
PT Cancellation Note / Discharge  Patient Details Name: [name] MRN: [id number] DOB: 11/24/1932   Cancelled Treatment:    Reason Eval/Treat Not Completed: Patient not medically ready  Pt just re-intubated today and not medically ready for PT.  Please re-order when pt medically appropriate.  Thanks!   Jaelyn Cloninger,KATHrine E 04/20/2013, 1:25 PM Zenovia Jarred, PT, DPT 04/20/2013 Pager: [phone]

## 2013-04-20 NOTE — Procedures (Signed)
Perc trach See full dictation Consent family Blood loss less 1 cc Size 6 placed  Tolerated well  Mcarthur Rossetti. Tyson Alias, MD, FACP Pgr: [phone]  Pulmonary & Critical Care

## 2013-04-20 NOTE — Progress Notes (Addendum)
Long discussion with husband & son & daughter meg on the phone. Explained rationale for re-intubation,severe deconditioning in this ARDS survivor, requiring almost continuous bipap now Discussed tstomy with goal of early mobilisation Risks & benefits including that of possible LTAC placement discussed Intubated uneventfully with 2 mg versed, 200 mcg fentanyl, etomidate 20 mg Vent orders given Will sedate with propofol/ int fent Plan for perc tstomy soon Hold off Tfs until time decided Await ABg & pCXR Can proceed with PICC repositioning by IR Labs drawn from PICC  Additional cc time x 40 mins  Cyril Mourning MD. [facility]. [place] [facility] Pager [phone] If no response call [phone]

## 2013-04-20 NOTE — Evaluation (Signed)
SLP Cancellation Note  Patient Details Name: [name] MRN: [id number] DOB: March 08, 1932   Cancelled treatment:       Reason Eval/Treat Not Completed:  (pt now on vent, possibly for trach per conversation with staff, slp to sign off - please reorder if indicated )   Mills Koller, MS [facility] SLP [phone]

## 2013-04-20 NOTE — Procedures (Signed)
Bedside Tracheostomy Insertion Procedure Note   Patient Details:   Name: [name] DOB: 09/23/1932 MRN: [id number]  Procedure: Tracheostomy  Pre Procedure Assessment: ET Tube Size:7.5 ET Tube secured at lip (cm):24 Bite block in place: Yes Breath Sounds: Rhonch  Post Procedure Assessment: BP 137/46  Pulse 102  Temp(Src) 100 F (37.8 C) (Core (Comment))  Resp 22  Ht 5\' 3"  (1.6 m)  Wt 190 lb 11.2 oz (86.5 kg)  BMI 33.79 kg/m2  SpO2 94%  LMP 03/03/1985 O2 sats: stable throughout Complications: No apparent complications Patient did tolerate procedure well Tracheostomy Brand:Shiley Tracheostomy Style:Cuffed Tracheostomy Size: 6.0 Tracheostomy Secured LDJ:TTSVXBL and velcro Tracheostomy Placement Confirmation:Trach cuff visualized and in place and post procedure chest x-ray done    Ruthell Rummage Nanette 04/20/2013, 4:23 PM

## 2013-04-20 NOTE — Progress Notes (Signed)
Name: [name] MRN: [id number] DOB: Sep 27, 1932   LOS 16 days   ADMISSION DATE:  04/04/2013 CONSULTATION DATE:  04/05/13  REFERRING MD :  Dr. Vanessa Barbara  PRIMARY SERVICE: TRH-->PCCM  CHIEF COMPLAINT:  Hypotension   BRIEF PATIENT DESCRIPTION: 80 y/o F admitted 2/2 with norovirus gastroenteritis,respiratory failure, hypovolemic and presumed septic shock -source unclear.  Course complicated by ARDS.  2/8 am suffered mucus plugging with ETT obstruction with subsequent cardiac arrest.  Required CPR x 9 mins, improved with re-intubation & left pneumothorax decompression  SIGNIFICANT EVENTS / STUDIES:  2/03 - Admit with fever, SOB, vomiting, diarrhea (concern for food poisoning) & CXR concerning for interstitial pneumonitis 2/03 - 2 d echo>>>Ef 45% grade 1 diastolic dysfunction  2/08 - resp arrest with mucus plugging, ETT exchanged 2/09 - changed to PCV overnight 2/10 - febrile overnight, hypotension requiring pressors 2/11 - CT Chest/ABD/Pelvis>>ARDS, R pleural effusion, L CT, Air in sq of anterior abd with marked inflammation 2/11 - CT Head>>neg 2/14 - fever curve down but wbc up 33K . Still +4L in 7 days but even balance x 3 dauys. Resp secretions improved but cxr stil looks bad 2/16 - extubated, required bipap post extubation   LINES / TUBES: L IJ TLC 2/3>>>2/17 OTT2/4>>>2/8, 2/8 (mucus plug)>>>2/16 A line 2/4>>2/14 OGT 2/4>>2/16 PICC 2/17>>>  CULTURES: MRSA PCR 2/3>>> negative BCx2 2/3>>>neg Sputum 2/3>>> normal flora UA 2/3>>> negative nit and LE Resp Viral Panel 2/3>>>negative 2/4 tracheal aspirate>>normal flora Stool culture, o&p 2/15>>>neg  CDiff PCR 04/11/13>>neg .................. BCx2 2/11>>>ng Sputum 2/11>>>few candidate UA 2/11>>>many yeast UC 2/11>>>greater 100k yeast -------------------- 2/17 BCx2>>>   ANTIBIOTICS: Rocephin 2/3>>>2/9 Azithro 2/3>>>2/9 Tamiflu 2/3>>>2/4 ............ Clinda 2/12>>>2/15 Vanco 2/12>>>2/15 Zosyn 2/12>>> Diflucan 2/12 (yeas  in urine) >>>2/14 Vanc 2/17>>>   SUBJECTIVE:  Remains on bipap. Febrile overnight.  New PICC malpositioned.    VITAL SIGNS: Temp:  [98.6 F (37 C)-101.5 F (38.6 C)] 101.5 F (38.6 C) (02/18 0900) Pulse Rate:  [83-108] 88 (02/18 0900) Resp:  [29-42] 37 (02/18 0900) BP: (79-184)/(34-85) 133/77 mmHg (02/18 0900) SpO2:  [89 %-96 %] 93 % (02/18 0900) FiO2 (%):  [60 %-100 %] 60 % (02/18 0800) Weight:  [194 lb 0.1 oz (88 kg)] 194 lb 0.1 oz (88 kg) (02/18 0400)  HEMODYNAMICS:    VENTILATOR SETTINGS: Vent Mode:  [-] Other (Comment) FiO2 (%):  [60 %-100 %] 60 % Set Rate:  [12 bmp] 12 bmp PEEP:  [6 cmH20] 6 cmH20  INTAKE / OUTPUT: Intake/Output     02/17 0701 - 02/18 0700 02/18 0701 - 02/19 0700   I.V. (mL/kg) 1413.3 (16.1) 70 (0.8)   IV Piggyback 600    Total Intake(mL/kg) 2013.3 (22.9) 70 (0.8)   Urine (mL/kg/hr) 3275 (1.6)    Stool 100 (0)    Chest Tube 270 (0.1)    Total Output 3645     Net -1631.7 +70          PHYSICAL EXAMINATION: General: elderly female, looks deconditioned and critically ill on bipap Neuro:   RASS 0, alert at times, follows commands, very weak all 4Es  HEENT:  Mm pink/dry, no jvd, OETT Cardiovascular:  s1s2 rrr, paced on monitor Lungs:  resps even non labored on vent, diminished bilaterally, left chest tube - without air leak Abdomen:  Round/soft, bsx4 active Musculoskeletal:  No acute deformities  Skin:  Warm/dry, generalized +++ edema .    LABS:  PULMONARY  Recent Labs Lab 04/13/13 1455 04/14/13 1121 04/18/13 0930  PHART 7.445 7.405  7.500*  PCO2ART 39.5 44.6 39.3  PO2ART 67.2* 66.2* 61.6*  HCO3 26.2* 26.8* 29.9*  TCO2 23.5 23.7 26.4  O2SAT 91.9 90.0 89.2   CBC  Recent Labs Lab 04/17/13 0420 04/18/13 0439 04/19/13 0752  HGB 9.6* 9.3* 11.4*  HCT 30.8* 29.5* 34.7*  WBC 28.2* 27.0* 38.1*  PLT PLATELET CLUMPS NOTED ON SMEAR, COUNT APPEARS ADEQUATE PLATELET CLUMPS NOTED ON SMEAR, COUNT APPEARS ADEQUATE PLATELET CLUMPS NOTED ON  SMEAR, COUNT APPEARS ADEQUATE   CARDIAC    Recent Labs Lab 04/16/13 0955 04/16/13 1638 04/17/13 0025  TROPONINI <0.30 <0.30 <0.30    Recent Labs Lab 04/15/13 0410 04/16/13 0430  PROBNP 2271.0* 3345.0*   CHEMISTRY  Recent Labs Lab 04/15/13 0410 04/16/13 0438 04/17/13 0420 04/18/13 0439 04/19/13 0752  NA 146 145 152* 152* 155*  K 4.3 3.3* 3.6* 3.2* 3.3*  CL 108 104 110 110 112  CO2 27 28 30 31 31   GLUCOSE 263* 242* 225* 262* 191*  BUN 39* 53* 57* 64* 52*  CREATININE 0.96 1.20* 1.42* 1.52* 1.45*  CALCIUM 8.0* 8.1* 8.0* 7.6* 8.0*  MG 2.4 2.4 2.5 2.3 2.6*  PHOS 3.9 4.2 4.2 4.4 4.0   Estimated Creatinine Clearance: 33.6 ml/min (by C-G formula based on Cr of 1.45).  LIVER  Recent Labs Lab 04/16/13 0955  AST 20  ALT 22  ALKPHOS 53  BILITOT 0.2*  PROT 5.6*  ALBUMIN 1.6*   INFECTIOUS  Recent Labs Lab 04/13/13 1800 04/14/13 0400 04/15/13 0410 04/16/13 1010 04/18/13 0439  LATICACIDVEN 1.6  --   --  2.5* 2.4*  PROCALCITON  --  4.31 2.61  --   --    ENDOCRINE CBG (last 3)   Recent Labs  04/19/13 2333 04/20/13 0352 04/20/13 0746  GLUCAP 170* 153* 133*   IMAGING x48h  Dg Chest Port 1 View  04/20/2013   CLINICAL DATA:  Bilateral airspace disease.  EXAM: PORTABLE CHEST - 1 VIEW  COMPARISON:  DG CHEST 1V PORT dated 04/19/2013  FINDINGS: Again noted is left PICC, difficult to follow past the left subclavian vein. Biventricular left cardiac pacemaker in situ. Left midlung zone pigtail drainage catheter in place with retaining looped within the chest wall.  Diffuse interstitial prominence is similar with patchy airspace opacities. Suspected small right pleural effusion. No pneumothorax. Mild biapical pleural thickening.  Multiple EKG lines overlie the patient and may obscure subtle underlying pathology. Soft tissue planes and included osseous structures are nonsuspicious.  IMPRESSION: No apparent change in position of life support lines.  Diffuse interstitial  and to lesser extent alveolar airspace opacities, similar. Suspected small right pleural effusion.   Electronically Signed   By: Awilda Metro   On: 04/20/2013 05:26   Dg Chest Port 1 View  04/19/2013   CLINICAL DATA:  Status post left PICC line placement  EXAM: PORTABLE CHEST - 1 VIEW  COMPARISON:  04/19/2013  FINDINGS: A right jugular central line is been removed in the interval. Diffuse bilateral infiltrates are again seen and stable. A drainage catheter is noted on the left. Pacing device is noted on the left as well. A new left-sided PICC line is seen. The tip is only visualized to approximately the mid subclavian level. Correlation with the recent placement is recommended.  IMPRESSION: The left PICC line could only be evaluated to the mid subclavian vein on the left. A cannot be adequately of visualized beyond that. Clinical correlation is recommended.  Diffuse bilateral airspace disease is again seen.   Electronically Signed  By: Alcide CleverMark  Lukens M.D.   On: 04/19/2013 16:51   Dg Chest Port 1 View  04/19/2013   CLINICAL DATA:  Chest tube.  EXAM: PORTABLE CHEST - 1 VIEW  COMPARISON:  04/17/2013  FINDINGS: Left pigtail catheter remains in place within the left hemi thorax, unchanged. No pneumothorax. Interval extubation. Left pacer in right central line remain in place, unchanged. Interval removal of NG tube.  Bilateral airspace opacities, slightly improved in the right upper lobe. Otherwise no change. No effusions. No acute bony abnormality.  IMPRESSION: Diffuse bilateral airspace disease, slightly improved in the right upper lobe.  Post extubation.   Electronically Signed   By: Charlett NoseKevin  Dover M.D.   On: 04/19/2013 06:08      ASSESSMENT / PLAN:  PULMONARY A: Acute Respiratory Failure - B infiltrates > ddx = ARDS, viral pneumonitis, bacterial, consider amiodarone or other non-infectious inflammatory process.  Note she has had a similar hospitalization 2-3 yrs ago (1/12). Consider COP, acute  amiodarone rxn.  Favor viral or non-infectious etiology. ARDS  Mucus plug - 2/8 with resp arrest Left pneumothorax s/p chest tube - post CPR  P:   -Cont Bipap PRN -- likely needs reintubation and early trach - will d/w family.  Suspect this will be prolonged hospitalization with long, slow recovery and ultimately trach and SNF placement -continue nebs -water seal chest tube -continue solumedrol and taper (started 2/13) -titrate O2 for sats >88% -f/u CXR  -cont to hold amiodarone   CARDIOVASCULAR A:  Septic and Hypovolemic Shock - transiently required pressors.   Cardiac arrest 2/8 am due to ETT obstruction Hx HTN Hx CHF / ICM s/p Pacemaker - compensated.  EF 55-60% 2/10 AFib on amiodarone for years  P:  -hold oral amiodarone -hold zocor -monitor hemodynamics -place TED hose -PICC malpositioned likely r/t pacer wires -- will exchange for midline per IV team or have IR reposition.  F/u labs once PICC repositioned  RENAL A:   Acute Kidney Injury - in setting of arrest.  -mild rise in creat with lasix with hypernatremia now improving  Hypernatremia  P:   -D5w @ 50 & continue lasix -trend BMP -replace lytes as indicated    GASTROINTESTINAL A:   Nausea / Vomiting / Diarrhea - resolved Diarrhea - ? tube feeds ? Zosyn. C-Diff neg Norovirus - POSITIVE 2/14 P:   -PPI -TF per nutrition    HEMATOLOGIC A:   Leukocytosis - steroids initiated 2/12 DVT Prophylaxis  Thrombocytopenia - platelets returning clumped periodically  P:  -heparin for DVT proph -monitor cbc -chk plts in citrate tube in am -- labs pending    INFECTIOUS A:   Gastroenteritis, Diarrhea.  NOROVIRUS + R/O CAP vs Viral Respiratory Illness - PCT elevated but trending down (44->2.6), favors infectious cause. -ceftriaxone + azithro completed 2/9 RVP neg - off tamiflu   Fever - noted off abx 2/10, leucocytosis ? Steroids ? Line sepsis Yeast UTI   P:   -continue vanc/zosyn for now with leukocytosis,  fever  -line changed 2/17 -reculture 2/17 -contact precautions    ENDOCRINE A:   Hypothyroidism   Gout  - with acute flare Hyperglycemia - due to steroids  P:   -continue synthroid -SSI, resistant scale   NEUROLOGIC A:   Delerium - CT head neg 2/11 Critical illness polyneuropathy P:   -PT consult -push mobilization  -will likely need LTAC   GLOBAL: -Severe deconditioning in setting of critical illness, resolving ARDS. Push PT efforts.  Updated family indetail  Danford Bad, NP 04/20/2013  10:24 AM Pager: [phone] or [phone]  *Care during the described time interval was provided by me and/or other providers on the critical care team. I have reviewed this patient's available data, including medical history, events of note, physical examination and test results as part of my evaluation.   Cc time x 13m  Javaris Wigington V.  [phone]

## 2013-04-20 NOTE — Progress Notes (Signed)
Follow up for emotional and spiritual support with pt's spouse and son around trach.

## 2013-04-20 NOTE — Procedures (Signed)
Intubation Procedure Note [name] [id number] 01/15/1933  Procedure: Intubation Indications: Respiratory insufficiency  Procedure Details Consent: Risks of procedure as well as the alternatives and risks of each were explained to the (patient/caregiver).  Consent for procedure obtained. Time Out: Verified patient identification, verified procedure, site/side was marked, verified correct patient position, special equipment/implants available, medications/allergies/relevent history reviewed, required imaging and test results available.  Performed  Maximum sterile technique was used including antiseptics, cap, gloves, gown, hand hygiene and mask.  MAC and 3    Evaluation Hemodynamic Status: BP stable throughout; O2 sats: stable throughout Patient's Current Condition: stable Complications: No apparent complications Patient did tolerate procedure well. Chest X-ray ordered to verify placement.  CXR: pending.   ALVA,RAKESH V. 04/20/2013

## 2013-04-20 NOTE — Progress Notes (Signed)
NUTRITION FOLLOW UP/CONSULT FOR TF MANAGEMENT  Intervention:   -Start TF via OGT of Oxepa at 98ml/hr increase by 108ml every 4 hours to goal of 77ml/hr with Prostat 67ml TID. This will provide 1380 calories, 90g protein, and 582ml free water. TF plus current calories provided from Propofol will provide a total of 1652 calories and meet 96% estimated calorie needs and 100% estimated protein needs. If IVF d/c recommend 123ml water flushes 6 times/day.  -Recommend MD replace pt's low potassium -Multivitamin 29ml daily via OGT -Continue adult enteral protocol  -Will continue to monitor   Nutrition Dx:   Inadequate oral intake related to inability to eat as evidenced by NPO - ongoing   Goal:   Pt to meet >/= 90% of their estimated nutrition needs - not met yet, TF to start today   Monitor:   Weights, labs, TF tolerance, vent status   Assessment:   Pt started having vomiting 04/03/13. She thought she had food poisoning and continued to vomiting throughout the day. She vomited at least a dozen times. She stopped vomiting around 10 am but then started having diarrhea, multiple episodes. As the day progressed she started feeling short of breath. She also developed chills and fever. Slight cough for the last few days. She did start to feel lightheaded. Hx of stage 3 CKD, HTN, hyperlipidemia, and hypothyroidism. Found to have acute hypoxemic respiratory failure, sepsis, pneumonia, and acute on chronic renal failure.   2/5 - Pt intubated yesterday for acute respiratory distress and placed on ARDS protocol. Pt alone in room, wearing mittens. Suspect pt with inadequate oral intake since 2/1 due to vomiting/diarrhea.   2/6 - Remains intubated. Started on TF via OGT of Oxepa at 16ml/hr. Met with RN who reports pt tolerating TF well with only 66ml residuals. Phosphorus low, potassium and magnesium WNL. Discussed with pharmacist. Pt getting sodium phosphate IV replacement today, expect phosphorus will improve with  replacement. Pt's weight up 20 pounds since admission, getting Lasix today.   2/9 - Reviewed events since last RD note. Pt had code blue 2/8, CPR performed, pt with improved oxygenation. Pt extubated and reintubated 2/8. Had chest tube inserted 2/8 for pneumothorax. Remains intubated. Phosphorus and magnesium WNL, potassium remains low - getting liquid replacement. Per conversation with RN, pt tolerating TF well with no residuals. No plans for extubation today. Pt's weight up 21 pounds since admission.   2/12 - Palliative care following, met with pt yesterday - noted plans for full code and "full force ahead". Per conversation with RN, pt with only 41ml TF residuals this morning and has been tolerating TF well. Remains intubated. Pt's weight up 31 pounds since admission. Is getting Lasix. Refeeding labs WNL.   Patient is currently intubated on ventilator support.  MV: 11.4 L/min Temp (24hrs), Avg:100.4 F (38 C), Min:98.6 F (37 C), Max:101.5 F (38.6 C)  Propofol: off TF: Oxepa to 35ml/hr via OGT with Prostat 2ml TID which provides 1740 calories, 105g protein, 784ml free water and meets 97% estimated calorie needs and 100% estimated protein needs.   2/17: -Pt extubated on 2/16 -Oxepa and Pro-stat d/c on 2/16 -Pt remains Bipap dependent and poorly arousable per SLP. SLP plan to reassess pt at later date -Blood glucose elevated r/t steroids -Continues with ARF. MD noted pt with increase in Crt d/t lasix. Phos WNL, Mag slightly elevated. Low K, is being repleted. -Wt continues to trend up -Pt w/loose stools, c.diff neg  2/18: -Re-intubated today -Per conversation with RN,  pt to get trach this afternoon.  -Received consult for TF management -Pt's weight up 27 pounds since admission, pt on Lasix  Patient is currently intubated on ventilator support.  MV: 10.2 L/min Temp (24hrs), Avg:100.4 F (38 C), Min:98.6 F (37 C), Max:101.5 F (38.6 C)  Propofol: 10.3 ml/hr - provides 272  calories   Sodium elevated Potassium low BUN/Cr elevated but trending down GFR low but improving slightly Magnesium slightly elevated yesterday, normal today  Potassium  Date/Time Value Ref Range Status  04/20/2013 11:59 AM 2.9* 3.7 - 5.3 mEq/L Final     CRITICAL RESULT CALLED TO, READ BACK BY AND VERIFIED WITH:     [facility] RN AT 1259 02.18.15 BY TIBBITTS,K  04/19/2013  7:52 AM 3.3* 3.7 - 5.3 mEq/L Final  04/18/2013  4:39 AM 3.2* 3.7 - 5.3 mEq/L Final    Phosphorus  Date/Time Value Ref Range Status  04/20/2013 11:59 AM 4.2  2.3 - 4.6 mg/dL Final  04/19/2013  7:52 AM 4.0  2.3 - 4.6 mg/dL Final  04/18/2013  4:39 AM 4.4  2.3 - 4.6 mg/dL Final    Magnesium  Date/Time Value Ref Range Status  04/20/2013 11:59 AM 2.3  1.5 - 2.5 mg/dL Final  04/19/2013  7:52 AM 2.6* 1.5 - 2.5 mg/dL Final  04/18/2013  4:39 AM 2.3  1.5 - 2.5 mg/dL Final     Height: Ht Readings from Last 1 Encounters:  04/16/13 $RemoveB'5\' 3"'rWEJhIsm$  (1.6 m)    Weight Status:   Wt Readings from Last 1 Encounters:  04/20/13 194 lb 0.1 oz (88 kg)  Admit wt:        167 lb 5.3 oz (75.9 kg)  Net I/Os: +6.1L  Re-estimated needs:  Kcal: 1715 Protein: 80-105g Fluid: >1.7L/day   Skin: +3 generalized edema, +2 RUE, LUE, RLE, LLE edema   Diet Order: NPO   Intake/Output Summary (Last 24 hours) at 04/20/13 1319 Last data filed at 04/20/13 1111  Gross per 24 hour  Intake   1810 ml  Output   4395 ml  Net  -2585 ml    Last BM: 2/18   Labs:   Recent Labs Lab 04/18/13 0439 04/19/13 0752 04/20/13 1159  NA 152* 155* 156*  K 3.2* 3.3* 2.9*  CL 110 112 112  CO2 $Re'31 31 30  'OYO$ BUN 64* 52* 43*  CREATININE 1.52* 1.45* 1.36*  CALCIUM 7.6* 8.0* 7.3*  MG 2.3 2.6* 2.3  PHOS 4.4 4.0 4.2  GLUCOSE 262* 191* 177*    CBG (last 3)   Recent Labs  04/20/13 0352 04/20/13 0746 04/20/13 1156  GLUCAP 153* 133* 152*    Scheduled Meds: . aspirin  325 mg Per Tube Daily  . chlorhexidine  15 mL Mouth Rinse BID  . etomidate  20 mg  Intravenous Once  . etomidate  40 mg Intravenous Once  . feeding supplement (OXEPA)  1,000 mL Per Tube Q24H  . fentaNYL  200 mcg Intravenous Once  . furosemide  40 mg Intravenous BID  . heparin  5,000 Units Subcutaneous 3 times per day  . insulin aspart  0-15 Units Subcutaneous 6 times per day  . ipratropium-albuterol  3 mL Nebulization Q6H  . levothyroxine  37.5 mcg Intravenous QAC breakfast  . methylPREDNISolone (SOLU-MEDROL) injection  40 mg Intravenous Q24H  . midazolam  4 mg Intravenous Once  . pantoprazole (PROTONIX) IV  40 mg Intravenous Daily  . piperacillin-tazobactam (ZOSYN)  IV  3.375 g Intravenous Q8H  . sodium chloride  500 mL  Intravenous Once  . sodium chloride  10-40 mL Intracatheter Q12H  . vancomycin  1,250 mg Intravenous Q24H  . vecuronium  10 mg Intravenous Once    Continuous Infusions: . sodium chloride 20 mL/hr at 04/19/13 1723  . dextrose 50 mL/hr at 04/20/13 1111  . propofol      Mikey College MS, [place], [place] Pager [phone] After Hours Pager

## 2013-04-20 NOTE — Evaluation (Signed)
SLP Cancellation Note  Patient Details Name: LATANA ANTE MRN: [id number] DOB: 1933/02/01   Cancelled treatment:       Reason Eval/Treat Not Completed:  (pt on bipap currently, will reattempt evaluation at later time)   Donavan Burnet, MS [facility] SLP [phone]

## 2013-04-20 NOTE — Progress Notes (Signed)
[id number] Earlene Plater, RN, BSN, CCM, [phone] Chart reviewed for update of needs and condition. Patient continues to require nrb full face mask and bipap to maintain o2 sats. per dr. Vassie Loll- reintubation and early trach - will d/w family.  Suspect this will be prolonged hospitalization with long, slow recovery and ultimately trach and SNF placement -continue nebs -water seal chest tube -continue solumedrol and taper (started 2/13) -titrate O2 for sats >88% -f/u CXR -cont to hold amiodarone

## 2013-04-21 ENCOUNTER — Inpatient Hospital Stay (HOSPITAL_COMMUNITY): Payer: Medicare Other

## 2013-04-21 ENCOUNTER — Encounter: Payer: Self-pay | Admitting: Pulmonary Disease

## 2013-04-21 LAB — GLUCOSE, CAPILLARY
GLUCOSE-CAPILLARY: 150 mg/dL — AB (ref 70–99)
GLUCOSE-CAPILLARY: 281 mg/dL — AB (ref 70–99)
GLUCOSE-CAPILLARY: 233 mg/dL — AB (ref 70–99)
Glucose-Capillary: 185 mg/dL — ABNORMAL HIGH (ref 70–99)
Glucose-Capillary: 188 mg/dL — ABNORMAL HIGH (ref 70–99)
Glucose-Capillary: 226 mg/dL — ABNORMAL HIGH (ref 70–99)

## 2013-04-21 LAB — BASIC METABOLIC PANEL
BUN: 39 mg/dL — ABNORMAL HIGH (ref 6–23)
CALCIUM: 7.5 mg/dL — AB (ref 8.4–10.5)
CO2: 30 meq/L (ref 19–32)
Chloride: 107 mEq/L (ref 96–112)
Creatinine, Ser: 1.22 mg/dL — ABNORMAL HIGH (ref 0.50–1.10)
GFR calc Af Amer: 48 mL/min — ABNORMAL LOW (ref >90)
GFR, EST NON AFRICAN AMERICAN: 41 mL/min — AB (ref >90)
GLUCOSE: 230 mg/dL — AB (ref 70–99)
Potassium: 3.4 mEq/L — ABNORMAL LOW (ref 3.7–5.3)
SODIUM: 149 meq/L — AB (ref 137–147)

## 2013-04-21 LAB — PHOSPHORUS: PHOSPHORUS: 3.1 mg/dL (ref 2.3–4.6)

## 2013-04-21 LAB — MAGNESIUM: Magnesium: 2.3 mg/dL (ref 1.5–2.5)

## 2013-04-21 LAB — LACTIC ACID, PLASMA: LACTIC ACID, VENOUS: 2.5 mmol/L — AB (ref 0.5–2.2)

## 2013-04-21 MED ORDER — ACETYLCYSTEINE 20 % IN SOLN
2.0000 mL | Freq: Two times a day (BID) | RESPIRATORY_TRACT | Status: DC
  Administered 2013-04-21 – 2013-04-22 (×3): 2 mL via RESPIRATORY_TRACT
  Filled 2013-04-21 (×5): qty 4

## 2013-04-21 MED ORDER — SODIUM CHLORIDE 0.9 % IV SOLN
25.0000 ug/h | INTRAVENOUS | Status: DC
  Administered 2013-04-21: 50 ug/h via INTRAVENOUS
  Administered 2013-04-22: 200 ug/h via INTRAVENOUS
  Administered 2013-04-22: 300 ug/h via INTRAVENOUS
  Filled 2013-04-21 (×3): qty 50

## 2013-04-21 MED ORDER — DEXMEDETOMIDINE HCL IN NACL 200 MCG/50ML IV SOLN
0.4000 ug/kg/h | INTRAVENOUS | Status: DC
Start: 2013-04-21 — End: 2013-04-21
  Administered 2013-04-21: 0.6 ug/kg/h via INTRAVENOUS
  Administered 2013-04-21: 0.4 ug/kg/h via INTRAVENOUS
  Filled 2013-04-21 (×3): qty 50

## 2013-04-21 MED ORDER — PANTOPRAZOLE SODIUM 40 MG PO PACK
40.0000 mg | PACK | Freq: Every day | ORAL | Status: DC
  Administered 2013-04-22: 40 mg
  Filled 2013-04-21 (×3): qty 20

## 2013-04-21 MED ORDER — OXEPA PO LIQD
1000.0000 mL | ORAL | Status: DC
  Administered 2013-04-21 – 2013-04-22 (×2): 1000 mL
  Filled 2013-04-21: qty 1000

## 2013-04-21 MED ORDER — "THROMBI-PAD 3""X3"" EX PADS"
1.0000 | MEDICATED_PAD | Freq: Once | CUTANEOUS | Status: AC
  Administered 2013-04-21: 1 via TOPICAL
  Filled 2013-04-21: qty 1

## 2013-04-21 MED ORDER — PHENYLEPHRINE HCL 10 MG/ML IJ SOLN
30.0000 ug/min | INTRAMUSCULAR | Status: DC
  Administered 2013-04-21: 30 ug/min via INTRAVENOUS
  Filled 2013-04-21: qty 1

## 2013-04-21 MED ORDER — SODIUM CHLORIDE 0.9 % IV BOLUS (SEPSIS)
500.0000 mL | Freq: Once | INTRAVENOUS | Status: AC
  Administered 2013-04-21: 500 mL via INTRAVENOUS

## 2013-04-21 MED ORDER — LEVOTHYROXINE SODIUM 75 MCG PO TABS
75.0000 ug | ORAL_TABLET | Freq: Every day | ORAL | Status: DC
  Administered 2013-04-22: 75 ug
  Filled 2013-04-21 (×2): qty 1

## 2013-04-21 MED ORDER — POTASSIUM CHLORIDE 10 MEQ/50ML IV SOLN
10.0000 meq | INTRAVENOUS | Status: AC
  Administered 2013-04-21 (×6): 10 meq via INTRAVENOUS
  Filled 2013-04-21 (×6): qty 50

## 2013-04-21 NOTE — Progress Notes (Signed)
NUTRITION FOLLOW UP  Intervention:   -Increase TF via NGT of Oxepa to 48ml/hr. This rate plus Prostat 56ml TID will provide 1740 calories, 105g protein, 725ml free water and meet 101% estimated calorie needs and 100% estimated protein needs. Pt getting 331ml free water flushes every 6 hours for a total of 1224ml water flushes and 798ml free water from TF for a total of 1951ml fluid daily.  -Will continue to monitor   Nutrition Dx:   Inadequate oral intake related to inability to eat as evidenced by NPO - ongoing   Goal:   Pt to meet >/= 90% of their estimated nutrition needs - not met but will meet once TF advanced    Monitor:   Weights, labs, TF tolerance, vent status   Assessment:   Pt started having vomiting 04/03/13. She thought she had food poisoning and continued to vomiting throughout the day. She vomited at least a dozen times. She stopped vomiting around 10 am but then started having diarrhea, multiple episodes. As the day progressed she started feeling short of breath. She also developed chills and fever. Slight cough for the last few days. She did start to feel lightheaded. Hx of stage 3 CKD, HTN, hyperlipidemia, and hypothyroidism. Found to have acute hypoxemic respiratory failure, sepsis, pneumonia, and acute on chronic renal failure.   2/5 - Pt intubated yesterday for acute respiratory distress and placed on ARDS protocol. Pt alone in room, wearing mittens. Suspect pt with inadequate oral intake since 2/1 due to vomiting/diarrhea.   2/6 - Remains intubated. Started on TF via OGT of Oxepa at 4ml/hr. Met with RN who reports pt tolerating TF well with only 73ml residuals. Phosphorus low, potassium and magnesium WNL. Discussed with pharmacist. Pt getting sodium phosphate IV replacement today, expect phosphorus will improve with replacement. Pt's weight up 20 pounds since admission, getting Lasix today.   2/9 - Reviewed events since last RD note. Pt had code blue 2/8, CPR performed, pt  with improved oxygenation. Pt extubated and reintubated 2/8. Had chest tube inserted 2/8 for pneumothorax. Remains intubated. Phosphorus and magnesium WNL, potassium remains low - getting liquid replacement. Per conversation with RN, pt tolerating TF well with no residuals. No plans for extubation today. Pt's weight up 21 pounds since admission.   2/12 - Palliative care following, met with pt yesterday - noted plans for full code and "full force ahead". Per conversation with RN, pt with only 55ml TF residuals this morning and has been tolerating TF well. Remains intubated. Pt's weight up 31 pounds since admission. Is getting Lasix. Refeeding labs WNL.   Patient is currently intubated on ventilator support.  MV: 11.4 L/min Temp (24hrs), Avg:100 F (37.8 C), Min:98.4 F (36.9 C), Max:101.5 F (38.6 C)  Propofol: off TF: Oxepa to 68ml/hr via OGT with Prostat 42ml TID which provides 1740 calories, 105g protein, 751ml free water and meets 97% estimated calorie needs and 100% estimated protein needs.   2/17: -Pt extubated on 2/16 -Oxepa and Pro-stat d/c on 2/16 -Pt remains Bipap dependent and poorly arousable per SLP. SLP plan to reassess pt at later date -Blood glucose elevated r/t steroids -Continues with ARF. MD noted pt with increase in Crt d/t lasix. Phos WNL, Mag slightly elevated. Low K, is being repleted. -Wt continues to trend up -Pt w/loose stools, c.diff neg  2/18: -Re-intubated today -Per conversation with RN, pt to get trach this afternoon.  -Received consult for TF management -Pt's weight up 27 pounds since admission, pt on  Lasix  2/19: -Per RN, pt tolerating TF well with minimal residuals -Pt no longer on Propofol -Remains intubated   Patient is currently intubated on ventilator support.  MV: 12.5 L/min Temp (24hrs), Avg:100 F (37.8 C), Min:98.4 F (36.9 C), Max:101.5 F (38.6 C)  Propofol: off  TF:  Oxepa at 85ml/hr via NGT increase by 80ml every 4 hours to goal  of 65ml/hr with Prostat 57ml TID. This provides 1380 calories, 90g protein, and 58ml free water and meets 80% of estimated calorie needs and 100% of estimated protein needs  Sodium elevated Potassium low, getting IV replacement BUN/Cr elevated but trending down GFR low but improving slightly   Potassium  Date/Time Value Ref Range Status  04/21/2013  5:45 AM 3.4* 3.7 - 5.3 mEq/L Final  04/20/2013  9:30 PM 3.1* 3.7 - 5.3 mEq/L Final  04/20/2013 11:59 AM 2.9* 3.7 - 5.3 mEq/L Final     CRITICAL RESULT CALLED TO, READ BACK BY AND VERIFIED WITH:     [facility] RN AT 1259 02.18.15 BY TIBBITTS,K    Phosphorus  Date/Time Value Ref Range Status  04/21/2013  5:45 AM 3.1  2.3 - 4.6 mg/dL Final  04/20/2013 11:59 AM 4.2  2.3 - 4.6 mg/dL Final  04/19/2013  7:52 AM 4.0  2.3 - 4.6 mg/dL Final    Magnesium  Date/Time Value Ref Range Status  04/21/2013  5:45 AM 2.3  1.5 - 2.5 mg/dL Final  04/20/2013 11:59 AM 2.3  1.5 - 2.5 mg/dL Final  04/19/2013  7:52 AM 2.6* 1.5 - 2.5 mg/dL Final     Height: Ht Readings from Last 1 Encounters:  04/16/13 $RemoveB'5\' 3"'WtBJdGbM$  (1.6 m)    Weight Status:   Wt Readings from Last 1 Encounters:  04/21/13 194 lb 3.6 oz (88.1 kg)  Admit wt:        167 lb 5.3 oz (75.9 kg)  Net I/Os: +5.7L  Re-estimated needs:  Kcal: 1715 Protein: 80-105g Fluid: >1.7L/day   Skin: +2 generalized edema, +2 RUE, RLE, LLE edema, +3 LUE edema  Diet Order: NPO   Intake/Output Summary (Last 24 hours) at 04/21/13 1144 Last data filed at 04/21/13 0600  Gross per 24 hour  Intake 2458.3 ml  Output   2895 ml  Net -436.7 ml    Last BM: 2/19   Labs:   Recent Labs Lab 04/19/13 0752 04/20/13 1159 04/20/13 2130 04/21/13 0545  NA 155* 156* 150* 149*  K 3.3* 2.9* 3.1* 3.4*  CL 112 112 108 107  CO2 $Re'31 30 30 30  'eiz$ BUN 52* 43* 41* 39*  CREATININE 1.45* 1.36* 1.34* 1.22*  CALCIUM 8.0* 7.3* 7.3* 7.5*  MG 2.6* 2.3  --  2.3  PHOS 4.0 4.2  --  3.1  GLUCOSE 191* 177* 234* 230*    CBG (last 3)    Recent Labs  04/20/13 2334 04/21/13 0410 04/21/13 0851  GLUCAP 185* 150* 188*    Scheduled Meds: . acetylcysteine  2 mL Nebulization BID  . aspirin  325 mg Per Tube Daily  . chlorhexidine  15 mL Mouth Rinse BID  . feeding supplement (OXEPA)  1,000 mL Per Tube Q24H  . feeding supplement (PRO-STAT SUGAR FREE 64)  30 mL Per Tube TID  . free water  300 mL Per Tube Q6H  . furosemide  40 mg Intravenous BID  . heparin  5,000 Units Subcutaneous 3 times per day  . insulin aspart  0-15 Units Subcutaneous 6 times per day  . ipratropium-albuterol  3 mL  Nebulization Q6H  . [START ON 04/22/2013] levothyroxine  75 mcg Per Tube QAC breakfast  . methylPREDNISolone (SOLU-MEDROL) injection  40 mg Intravenous Q24H  . multivitamin  5 mL Per Tube Daily  . [START ON 04/22/2013] pantoprazole sodium  40 mg Per Tube Daily  . piperacillin-tazobactam (ZOSYN)  IV  3.375 g Intravenous Q8H  . sodium chloride  500 mL Intravenous Once  . sodium chloride  10-40 mL Intracatheter Q12H  . vancomycin  1,250 mg Intravenous Q24H    Continuous Infusions: . sodium chloride 20 mL/hr at 04/19/13 1723  . dexmedetomidine 0.6 mcg/kg/hr (04/21/13 1109)  . dextrose 50 mL/hr at 04/21/13 [place], RD, [place] Pager [phone] After Hours Pager

## 2013-04-21 NOTE — Progress Notes (Signed)
Name: CERIA SUMINSKI MRN: [id number] DOB: 06-17-1932   LOS 17 days   ADMISSION DATE:  04/04/2013 CONSULTATION DATE:  04/05/13  REFERRING MD :  Dr. Vanessa Barbara  PRIMARY SERVICE: TRH-->PCCM  CHIEF COMPLAINT:  Hypotension   BRIEF PATIENT DESCRIPTION: 80 y/o F admitted 2/2 with norovirus gastroenteritis,respiratory failure, hypovolemic and presumed septic shock -source unclear.  Course complicated by ARDS.  2/8 am suffered mucus plugging with ETT obstruction with subsequent cardiac arrest.  Required CPR x 9 mins, improved with re-intubation & left pneumothorax decompression  SIGNIFICANT EVENTS / STUDIES:  2/03 - Admit with fever, SOB, vomiting, diarrhea (concern for food poisoning) & CXR concerning for interstitial pneumonitis 2/03 - 2 d echo>>>Ef 45% grade 1 diastolic dysfunction  2/08 - resp arrest with mucus plugging, ETT exchanged 2/09 - changed to PCV overnight 2/10 - febrile overnight, hypotension requiring pressors 2/11 - CT Chest/ABD/Pelvis>>ARDS, R pleural effusion, L CT, Air in sq of anterior abd with marked inflammation 2/11 - CT Head>>neg 2/14 - fever curve down but wbc up 33K . Still +4L in 7 days but even balance x 3 dauys. Resp secretions improved but cxr stil looks bad 2/16 - extubated, required bipap post extubation   LINES / TUBES: L IJ TLC 2/3>>>2/17 OTT2/4>>>2/8, 2/8 (mucus plug)>>>2/16, 2/18 >> 2/18 Trach 2/18 >> A line 2/4>>2/14 OGT 2/4>>2/16 PICC 2/17>>>  CULTURES: MRSA PCR 2/3>>> negative BCx2 2/3>>>neg Sputum 2/3>>> normal flora UA 2/3>>> negative nit and LE Resp Viral Panel 2/3>>>negative 2/4 tracheal aspirate>>normal flora Stool culture, o&p 2/15>>>neg  CDiff PCR 04/11/13>>neg .................. BCx2 2/11>>>ng Sputum 2/11>>>few candidate UA 2/11>>>many yeast UC 2/11>>>greater 100k yeast --------------------   ANTIBIOTICS: Rocephin 2/3>>>2/9 Azithro 2/3>>>2/9 Tamiflu 2/3>>>2/4 ............ Clinda 2/12>>>2/15 Vanco 2/12>>>2/15 Zosyn  2/12>>> Diflucan 2/12 (yeas in urine) >>>2/14 Vanc 2/17>>>   SUBJECTIVE:  Afebrile Some bleeding around trach  Frequent coughing  VITAL SIGNS: Temp:  [98.4 F (36.9 C)-101.3 F (38.5 C)] 100.4 F (38 C) (02/19 0604) Pulse Rate:  [70-103] 95 (02/19 0604) Resp:  [19-34] 26 (02/19 0604) BP: (102-175)/(24-88) 126/40 mmHg (02/19 0604) SpO2:  [89 %-100 %] 93 % (02/19 0814) FiO2 (%):  [50 %-60 %] 60 % (02/19 0901) Weight:  [86.5 kg (190 lb 11.2 oz)-88.1 kg (194 lb 3.6 oz)] 88.1 kg (194 lb 3.6 oz) (02/19 0500)  HEMODYNAMICS:    VENTILATOR SETTINGS: Vent Mode:  [-] PRVC FiO2 (%):  [50 %-60 %] 60 % Set Rate:  [14 bmp] 14 bmp Vt Set:  [500 mL] 500 mL PEEP:  [5 cmH20] 5 cmH20 Pressure Support:  [10 cmH20] 10 cmH20 Plateau Pressure:  [18 cmH20-24 cmH20] 24 cmH20  INTAKE / OUTPUT: Intake/Output     02/18 0701 - 02/19 0700 02/19 0701 - 02/20 0700   I.V. (mL/kg) 1878.3 (21.3)    NG/GT 260    IV Piggyback 900    Total Intake(mL/kg) 3038.3 (34.5)    Urine (mL/kg/hr) 3675 (1.7)    Stool 450 (0.2)    Chest Tube 70 (0)    Total Output 4195     Net -1156.7            PHYSICAL EXAMINATION: General: elderly female, looks deconditioned and critically ill  Neuro:   RASS 0, sedated on propofol, very weak all 4Es  HEENT:  Mm pink/dry, no jvd, OETT Cardiovascular:  s1s2 rrr, paced on monitor Lungs:  resps even non labored on vent, diminished bilaterally, left chest tube - without air leak Abdomen:  Round/soft, bsx4 active Musculoskeletal:  No acute deformities  Skin:  Warm/dry, generalized +++ edema .    LABS:  PULMONARY  Recent Labs Lab 04/14/13 1121 04/18/13 0930 04/20/13 1247  PHART 7.405 7.500* 7.565*  PCO2ART 44.6 39.3 34.4*  PO2ART 66.2* 61.6* 68.4*  HCO3 26.8* 29.9* 31.2*  TCO2 23.7 26.4 28.0  O2SAT 90.0 89.2 93.7   CBC  Recent Labs Lab 04/18/13 0439 04/19/13 0752 04/20/13 1159  HGB 9.3* 11.4* 10.5*  HCT 29.5* 34.7* 33.2*  WBC 27.0* 38.1* 20.9*  PLT  PLATELET CLUMPS NOTED ON SMEAR, COUNT APPEARS ADEQUATE PLATELET CLUMPS NOTED ON SMEAR, COUNT APPEARS ADEQUATE PLATELET CLUMPS NOTED ON SMEAR, COUNT APPEARS ADEQUATE   CARDIAC    Recent Labs Lab 04/16/13 0955 04/16/13 1638 04/17/13 0025  TROPONINI <0.30 <0.30 <0.30    Recent Labs Lab 04/15/13 0410 04/16/13 0430  PROBNP 2271.0* 3345.0*   CHEMISTRY  Recent Labs Lab 04/17/13 0420 04/18/13 0439 04/19/13 0752 04/20/13 1159 04/20/13 2130 04/21/13 0545  NA 152* 152* 155* 156* 150* 149*  K 3.6* 3.2* 3.3* 2.9* 3.1* 3.4*  CL 110 110 112 112 108 107  CO2 30 31 31 30 30 30   GLUCOSE 225* 262* 191* 177* 234* 230*  BUN 57* 64* 52* 43* 41* 39*  CREATININE 1.42* 1.52* 1.45* 1.36* 1.34* 1.22*  CALCIUM 8.0* 7.6* 8.0* 7.3* 7.3* 7.5*  MG 2.5 2.3 2.6* 2.3  --  2.3  PHOS 4.2 4.4 4.0 4.2  --  3.1   Estimated Creatinine Clearance: 40 ml/min (by C-G formula based on Cr of 1.22).  LIVER  Recent Labs Lab 04/16/13 0955 04/20/13 1400  AST 20  --   ALT 22  --   ALKPHOS 53  --   BILITOT 0.2*  --   PROT 5.6*  --   ALBUMIN 1.6*  --   INR  --  1.30   INFECTIOUS  Recent Labs Lab 04/15/13 0410 04/16/13 1010 04/18/13 0439  LATICACIDVEN  --  2.5* 2.4*  PROCALCITON 2.61  --   --    ENDOCRINE CBG (last 3)   Recent Labs  04/20/13 2334 04/21/13 0410 04/21/13 0851  GLUCAP 185* 150* 188*   IMAGING x48h  Dg Chest Port 1 View  04/21/2013   CLINICAL DATA:  Respiratory failure.  EXAM: PORTABLE CHEST - 1 VIEW  COMPARISON:  DG CHEST 1V PORT dated 04/20/2013  FINDINGS: Support devices are stable. Left pigtail drainage catheter remains in place. No visible pneumothorax.  Diffuse interstitial and alveolar opacities again noted, stable. Mild cardiomegaly. No acute bony abnormality.  IMPRESSION: Stable support devices and appearance of the lungs.   Electronically Signed   By: Charlett NoseKevin  Dover M.D.   On: 04/21/2013 06:30   Chest Portable 1 View To Assess Tube Placement And Rule-out  Pneumothorax  04/20/2013   CLINICAL DATA:  Tube placement.  EXAM: PORTABLE CHEST - 1 VIEW  COMPARISON:  DG CHEST 1V PORT dated 04/20/2013  FINDINGS: Interim removal of endotracheal tube and placement of tracheostomy tube. Tracheostomy tube in good anatomic position. NG tube tip noted below left hemidiaphragm. Left chest tube in stable position. No pneumothorax. Heart size normal. Cardiac pacer. No pulmonary venous congestion. Bilateral pulmonary infiltrates are present. These remain severe. No acute osseous abnormality.  IMPRESSION: 1. Interim placement of tracheostomy tube, tracheostomy tube in good anatomic position. NG tube and left chest tube in stable position. 2. Dense bilateral persistent pulmonary infiltrates. No pneumothorax.   Electronically Signed   By: Maisie Fushomas  Register   On: 04/20/2013 16:10   Portable Chest Xray  04/20/2013   CLINICAL DATA:  Endotracheal tube placed  EXAM: PORTABLE CHEST - 1 VIEW  COMPARISON:  Chest x-ray from the same day at 4:39 a.m.  FINDINGS: New endotracheal tube, tip in the distal thoracic trachea, 2 cm above the carina. New orogastric tube enters the stomach at least. Left-sided chest tube and left approach biventricular pacer/ICD shows no interval change.  No cardiomegaly. Diffuse interstitial and airspace disease persists. No evidence of increasing pleural fluid or pneumothorax.  IMPRESSION: 1. New endotracheal tube in good position. 2. Unchanged diffuse interstitial and airspace disease.   Electronically Signed   By: Tiburcio Pea M.D.   On: 04/20/2013 12:43   Dg Chest Port 1 View  04/20/2013   CLINICAL DATA:  Bilateral airspace disease.  EXAM: PORTABLE CHEST - 1 VIEW  COMPARISON:  DG CHEST 1V PORT dated 04/19/2013  FINDINGS: Again noted is left PICC, difficult to follow past the left subclavian vein. Biventricular left cardiac pacemaker in situ. Left midlung zone pigtail drainage catheter in place with retaining looped within the chest wall.  Diffuse interstitial  prominence is similar with patchy airspace opacities. Suspected small right pleural effusion. No pneumothorax. Mild biapical pleural thickening.  Multiple EKG lines overlie the patient and may obscure subtle underlying pathology. Soft tissue planes and included osseous structures are nonsuspicious.  IMPRESSION: No apparent change in position of life support lines.  Diffuse interstitial and to lesser extent alveolar airspace opacities, similar. Suspected small right pleural effusion.   Electronically Signed   By: Awilda Metro   On: 04/20/2013 05:26   Dg Chest Port 1 View  04/19/2013   CLINICAL DATA:  Status post left PICC line placement  EXAM: PORTABLE CHEST - 1 VIEW  COMPARISON:  04/19/2013  FINDINGS: A right jugular central line is been removed in the interval. Diffuse bilateral infiltrates are again seen and stable. A drainage catheter is noted on the left. Pacing device is noted on the left as well. A new left-sided PICC line is seen. The tip is only visualized to approximately the mid subclavian level. Correlation with the recent placement is recommended.  IMPRESSION: The left PICC line could only be evaluated to the mid subclavian vein on the left. A cannot be adequately of visualized beyond that. Clinical correlation is recommended.  Diffuse bilateral airspace disease is again seen.   Electronically Signed   By: Alcide Clever M.D.   On: 04/19/2013 16:51   Dg Abd Portable 1v  04/20/2013   CLINICAL DATA:  Nasogastric tube placement.  EXAM: PORTABLE ABDOMEN - 1 VIEW  COMPARISON:  None.  FINDINGS: A nasogastric tube extends into the distal stomach/ proximal duodenum. The underlying bowel gas pattern is unremarkable.  IMPRESSION: Nasogastric tube extends into the distal stomach/ proximal duodenum.   Electronically Signed   By: Irish Lack M.D.   On: 04/20/2013 13:03      ASSESSMENT / PLAN:  PULMONARY A: Acute Respiratory Failure - B infiltrates > ddx = ARDS, viral pneumonitis, bacterial,  consider amiodarone or other non-infectious inflammatory process.  Note she has had a similar hospitalization 2-3 yrs ago (1/12). Consider COP, acute amiodarone rxn.  Favor viral or non-infectious etiology. ARDS  Mucus plug - 2/8 with resp arrest Left pneumothorax s/p chest tube - post CPR Tstomy bleeding  P:   - -continue nebs -water seal chest tube, dc once on ATC -continue solumedrol 40 q 24 -titrate O2 for sats >88%,SBts with goal ATC -cont to hold amiodarone  -thrombipad packed around trach -  add mucinex & mucormyst  CARDIOVASCULAR A:  Septic and Hypovolemic Shock - transiently required pressors.   Cardiac arrest 2/8 am due to ETT obstruction Hx HTN Hx CHF / ICM s/p Pacemaker - compensated.  EF 55-60% 2/10 AFib on amiodarone for years  P:  -hold oral amiodarone -hold zocor -place TED hose -PICC malpositioned likely r/t pacer wires -- will exchange for midline per IV team or have IR reposition.    RENAL A:   Acute Kidney Injury - in setting of arrest.  -mild rise in creat with lasix with hypernatremia now improving  Hypernatremia  P:   -D5w @ 50 & continue lasix for neg balance -trend BMP -replace lytes as indicated    GASTROINTESTINAL A:   Nausea / Vomiting / Diarrhea - resolved Diarrhea - ? tube feeds ? Zosyn. C-Diff neg Norovirus - POSITIVE 2/14 P:   -PPI -TF per nutrition    HEMATOLOGIC A:   Leukocytosis - steroids initiated 2/12 DVT Prophylaxis  Thrombocytopenia - platelets returning clumped periodically  P:  -heparin for DVT proph -monitor cbc -chk plts in citrate tube in am  INFECTIOUS A:   Gastroenteritis, Diarrhea.  NOROVIRUS + R/O CAP vs Viral Respiratory Illness - PCT elevated but trending down (44->2.6), favors infectious cause. -ceftriaxone + azithro completed 2/9 RVP neg - off tamiflu   Fever - noted off abx 2/10, leucocytosis ? Steroids ? Line sepsis Yeast UTI   P:   -continue vanc/zosyn for now with leukocytosis, fever   -reculture 2/17 -contact precautions    ENDOCRINE A:   Hypothyroidism   Gout  - with acute flare Hyperglycemia - due to steroids  P:   -continue synthroid -SSI, resistant scale   NEUROLOGIC A:   Delerium - CT head neg 2/11 Critical illness polyneuropathy P:   -PT consult -push mobilization     GLOBAL: -Severe deconditioning in setting of critical illness, resolving ARDS. Push SBt efforts to ATC then PT Updated family in detail, plan for LTAC    *Care during the described time interval was provided by me and/or other providers on the critical care team. I have reviewed this patient's available data, including medical history, events of note, physical examination and test results as part of my evaluation.   Cc time x 68m  ALVA,RAKESH V.  [phone]

## 2013-04-21 NOTE — Progress Notes (Signed)
eLink Physician-Brief Progress Note Patient Name: [name] DOB: 12/05/1932 MRN: [id number]  Date of Service  04/21/2013   HPI/Events of Note  Hypokalemia   eICU Interventions  Potassium replaced   Intervention Category Intermediate Interventions: Electrolyte abnormality - evaluation and management  Kenyona Rena 04/21/2013, 1:55 AM

## 2013-04-21 NOTE — Progress Notes (Signed)
eLink Physician-Brief Progress Note Patient Name: [name] DOB: 1932/07/02 MRN: [id number]  Date of Service  04/21/2013   HPI/Events of Note   Hypotension, receiving lasix; receiving abx, given dropping WBC doubt new sepsis despite ongoing fever  eICU Interventions  Stop lasix 500cc fluid bolus Monitor HR, fever curve   Intervention Category Major Interventions: Hypotension - evaluation and management  MCQUAID, DOUGLAS 04/21/2013, 4:17 PM

## 2013-04-21 NOTE — Progress Notes (Signed)
eLink Physician-Brief Progress Note Patient Name: [name] DOB: 20-May-1932 MRN: [id number]  Date of Service  04/21/2013   HPI/Events of Note   Hypotension continues despite fliud  eICU Interventions  Hold precedex Change to fentanyl If no improvement then start pressor   Intervention Category Major Interventions: Hypotension - evaluation and management  MCQUAID, DOUGLAS 04/21/2013, 5:02 PM

## 2013-04-21 NOTE — Progress Notes (Signed)
CARE MANAGEMENT NOTE 04/21/2013  Patient:  [name], [name]   Account Number:  [id number]  Date Initiated:  04/05/2013  Documentation initiated by:  DAVIS,RHONDA  Subjective/Objective Assessment:   pt with hx of resp failure admitted with hypoxia and requiring bipap.     Action/Plan:   lives at home with spouse , no known history of hhc or o2 at home.   Anticipated DC Date:  04/24/2013   Anticipated DC Plan:  LONG TERM ACUTE CARE (LTAC)  In-house referral  NA      DC Planning Services  CM consult      PAC Choice  LONG TERM ACUTE CARE   Choice offered to / List presented to:  NA   DME arranged  NA      DME agency  NA     HH arranged  NA      HH agency  NA   Status of service:  In process, will continue to follow Medicare Important Message given?  NA - LOS <3 / Initial given by admissions (If response is "NO", the following Medicare IM given date fields will be blank) Date Medicare IM given:   Date Additional Medicare IM given:    Discharge Disposition:    Per UR Regulation:  Reviewed for med. necessity/level of care/duration of stay  If discussed at Long Length of Stay Meetings, dates discussed:   04/12/2013  04/14/2013  04/19/2013  04/21/2013    Comments:  [id number] Earlene Plater, RN, BSN, CCM, [phone] Chart reviewed for update of needs and condition. tracheostomy done on [id number] and pt placed back on full vent support/plan is for ltach placement/family has information on both area ltac's husband perfers [facility]-and resp into see family.  [id number] Earlene Plater, RN, BSN, CCM, [phone] Chart reviewed for update of needs and condition. Patient continues to require nrb full face mask and bipap to maintain o2 sats. per dr. Vassie Loll- reintubation and early trach - will d/w family.  Suspect this will be prolonged hospitalization with long, slow recovery and ultimately trach and SNF placement -continue nebs -water seal chest tube -continue solumedrol and taper  (started 2/13) -titrate O2 for sats >88% -f/u CXR -cont to hold amiodarone  [id number]/Rhonda Earlene Plater, RN, BSN, [place], [phone] Chart reviewed for update of needs and condition. benefits request for latc placement verses snf vs cir. Both area ltach providers notified of poss. needs for services. patient is on bipap,  Fi02 at 70% and peep at 6cm.  [id number] Earlene Plater, RN, BSN, CCM [phone] Chart Reviewed for discharge and hospital needs. Discharge needs at time of review:  None present will follow for needs. Review of patient progress due on [id number] Active weaning to place [id number]/plan if positive trend to do cir referral if not able to wean will contact ltac  [id number]/Rhonda Earlene Plater, RN, BSN, [place], [phone] Chart reviewed for update of needs and condition. vent day3/ weaning process started this TG/PQ98 down to 40% and peep down to 8.   [id number] Earlene Plater, RN, BSN, [place], [phone] Chart reviewed for update of needs and condition. patient has Pacemaker. [id number] of Arterial Catheter Indications: Blood pressure monitoring and Frequent blood sampling/intubated [id number]/CVP 5 mmHg/VENT DAY 1     [id number]/Rhonda Earlene Plater, RN, BSN, [place] [phone] Chart Reviewed for discharge and hospital needs. Discharge needs at time of review:  None present will follow for needs. Review of patient progress due on [id number].

## 2013-04-21 NOTE — Progress Notes (Signed)
eLink Physician-Brief Progress Note Patient Name: [name] DOB: 1932/03/25 MRN: [id number]  Date of Service  04/21/2013   HPI/Events of Note   Hypotension persists off precedex, post fluid  eICU Interventions  Start neosynephrine Check lactic acid   Intervention Category Major Interventions: Hypotension - evaluation and management  MCQUAID, DOUGLAS 04/21/2013, 5:44 PM

## 2013-04-21 NOTE — Progress Notes (Signed)
Inpatient Diabetes Program Recommendations  AACE/ADA: New Consensus Statement on Inpatient Glycemic Control (2013)  Target Ranges:  Prepandial:   less than 140 mg/dL      Peak postprandial:   less than 180 mg/dL (1-2 hours)      Critically ill patients:  140 - 180 mg/dL   Reason for Visit: Hyperglycemia  Results for REDONDA, WOLTER (MRN [id number]) as of 04/21/2013 11:39  Ref. Range 04/20/2013 16:32 04/20/2013 20:36 04/20/2013 23:34 04/21/2013 04:10 04/21/2013 08:51  Glucose-Capillary  Latest Range: 70-99 mg/dL 358 (H) 251 (H) 898 (H) 150 (H) 188 (H)  Please consider ICU Hyperglycemia Protocol with TF coverage.  Inpatient Diabetes Program Recommendations Insulin - Basal: ICU Hyperglycemia Orders Insulin - Meal Coverage: Add Novolog 3 units Q4H to cover TFs HgbA1C: .  Thank you. Ailene Ards, RD, LDN, CDE Inpatient Diabetes Coordinator [phone]

## 2013-04-22 ENCOUNTER — Inpatient Hospital Stay (HOSPITAL_COMMUNITY): Payer: Medicare Other

## 2013-04-22 ENCOUNTER — Inpatient Hospital Stay
Admission: AD | Admit: 2013-04-22 | Discharge: 2013-05-02 | Payer: Medicare Other | Source: Ambulatory Visit | Attending: Internal Medicine | Admitting: Internal Medicine

## 2013-04-22 ENCOUNTER — Other Ambulatory Visit (HOSPITAL_COMMUNITY): Payer: Self-pay

## 2013-04-22 DIAGNOSIS — Z930 Tracheostomy status: Secondary | ICD-10-CM

## 2013-04-22 DIAGNOSIS — Z79899 Other long term (current) drug therapy: Secondary | ICD-10-CM

## 2013-04-22 DIAGNOSIS — R509 Fever, unspecified: Secondary | ICD-10-CM

## 2013-04-22 DIAGNOSIS — I4891 Unspecified atrial fibrillation: Secondary | ICD-10-CM

## 2013-04-22 DIAGNOSIS — J189 Pneumonia, unspecified organism: Secondary | ICD-10-CM

## 2013-04-22 DIAGNOSIS — A419 Sepsis, unspecified organism: Secondary | ICD-10-CM

## 2013-04-22 DIAGNOSIS — J9690 Respiratory failure, unspecified, unspecified whether with hypoxia or hypercapnia: Secondary | ICD-10-CM

## 2013-04-22 DIAGNOSIS — J9620 Acute and chronic respiratory failure, unspecified whether with hypoxia or hypercapnia: Secondary | ICD-10-CM

## 2013-04-22 LAB — BASIC METABOLIC PANEL
BUN: 40 mg/dL — ABNORMAL HIGH (ref 6–23)
CHLORIDE: 104 meq/L (ref 96–112)
CO2: 29 meq/L (ref 19–32)
Calcium: 7.3 mg/dL — ABNORMAL LOW (ref 8.4–10.5)
Creatinine, Ser: 1.31 mg/dL — ABNORMAL HIGH (ref 0.50–1.10)
GFR calc non Af Amer: 38 mL/min — ABNORMAL LOW (ref >90)
GFR, EST AFRICAN AMERICAN: 44 mL/min — AB (ref >90)
Glucose, Bld: 226 mg/dL — ABNORMAL HIGH (ref 70–99)
POTASSIUM: 3.1 meq/L — AB (ref 3.7–5.3)
SODIUM: 143 meq/L (ref 137–147)

## 2013-04-22 LAB — CBC WITH DIFFERENTIAL/PLATELET
BASOS ABS: 0 10*3/uL (ref 0.0–0.1)
Basophils Relative: 0 % (ref 0–1)
EOS PCT: 1 % (ref 0–5)
Eosinophils Absolute: 0.2 10*3/uL (ref 0.0–0.7)
HEMATOCRIT: 28.7 % — AB (ref 36.0–46.0)
Hemoglobin: 9.1 g/dL — ABNORMAL LOW (ref 12.0–15.0)
LYMPHS ABS: 0.9 10*3/uL (ref 0.7–4.0)
Lymphocytes Relative: 4 % — ABNORMAL LOW (ref 12–46)
MCH: 29.8 pg (ref 26.0–34.0)
MCHC: 31.7 g/dL (ref 30.0–36.0)
MCV: 94.1 fL (ref 78.0–100.0)
Monocytes Absolute: 0.2 10*3/uL (ref 0.1–1.0)
Monocytes Relative: 1 % — ABNORMAL LOW (ref 3–12)
NEUTROS ABS: 21.4 10*3/uL — AB (ref 1.7–7.7)
Neutrophils Relative %: 94 % — ABNORMAL HIGH (ref 43–77)
Platelets: UNDETERMINED 10*3/uL (ref 150–400)
RBC: 3.05 MIL/uL — AB (ref 3.87–5.11)
RDW: 15.7 % — AB (ref 11.5–15.5)
WBC: 22.7 10*3/uL — ABNORMAL HIGH (ref 4.0–10.5)

## 2013-04-22 LAB — GLUCOSE, CAPILLARY
GLUCOSE-CAPILLARY: 216 mg/dL — AB (ref 70–99)
GLUCOSE-CAPILLARY: 222 mg/dL — AB (ref 70–99)
Glucose-Capillary: 149 mg/dL — ABNORMAL HIGH (ref 70–99)
Glucose-Capillary: 134 mg/dL — ABNORMAL HIGH (ref 70–99)

## 2013-04-22 LAB — PROCALCITONIN: PROCALCITONIN: 0.64 ng/mL

## 2013-04-22 LAB — VANCOMYCIN, TROUGH: VANCOMYCIN TR: 17.1 ug/mL (ref 10.0–20.0)

## 2013-04-22 LAB — PLATELET COUNT: Platelets: ADEQUATE 10*3/uL (ref 150–400)

## 2013-04-22 MED ORDER — ACETAMINOPHEN 160 MG/5ML PO SOLN: 650.0000 mg | Freq: Four times a day (QID) | ORAL | Status: DC | PRN

## 2013-04-22 MED ORDER — SODIUM CHLORIDE 0.9 % IV SOLN: INTRAVENOUS | Status: DC

## 2013-04-22 MED ORDER — POTASSIUM CHLORIDE 20 MEQ/15ML (10%) PO LIQD
40.0000 meq | Freq: Once | ORAL | Status: AC
  Administered 2013-04-22: 40 meq
  Filled 2013-04-22: qty 30

## 2013-04-22 MED ORDER — HEPARIN SODIUM (PORCINE) 5000 UNIT/ML IJ SOLN: 5000.0000 [IU] | Freq: Three times a day (TID) | INTRAMUSCULAR | Status: DC

## 2013-04-22 MED ORDER — INSULIN ASPART 100 UNIT/ML ~~LOC~~ SOLN: 0.0000 [IU] | SUBCUTANEOUS | Status: DC

## 2013-04-22 MED ORDER — PANTOPRAZOLE SODIUM 40 MG PO PACK
40.0000 mg | PACK | Freq: Every day | ORAL | Status: DC
Start: 2013-04-22 — End: 2013-05-05

## 2013-04-22 MED ORDER — METHYLPREDNISOLONE SODIUM SUCC 40 MG IJ SOLR: 40.0000 mg | INTRAMUSCULAR | Status: DC

## 2013-04-22 MED ORDER — DEXMEDETOMIDINE HCL IN NACL 200 MCG/50ML IV SOLN: 0.4000 ug/kg/h | INTRAVENOUS | Status: DC

## 2013-04-22 MED ORDER — FENTANYL CITRATE 0.05 MG/ML IJ SOLN: 50.0000 ug | INTRAMUSCULAR | Status: DC | PRN

## 2013-04-22 MED ORDER — PRO-STAT SUGAR FREE PO LIQD: 30.0000 mL | Freq: Three times a day (TID) | ORAL | Status: DC

## 2013-04-22 MED ORDER — ACETYLCYSTEINE 20 % IN SOLN: 2.0000 mL | Freq: Two times a day (BID) | RESPIRATORY_TRACT | Status: DC

## 2013-04-22 MED ORDER — VANCOMYCIN HCL 10 G IV SOLR: INTRAVENOUS | Status: DC

## 2013-04-22 MED ORDER — DEXMEDETOMIDINE HCL IN NACL 200 MCG/50ML IV SOLN
0.4000 ug/kg/h | INTRAVENOUS | Status: DC
  Administered 2013-04-22: 0.4 ug/kg/h via INTRAVENOUS
  Filled 2013-04-22: qty 50

## 2013-04-22 MED ORDER — IPRATROPIUM-ALBUTEROL 0.5-2.5 (3) MG/3ML IN SOLN: 3.0000 mL | Freq: Four times a day (QID) | RESPIRATORY_TRACT | Status: DC

## 2013-04-22 MED ORDER — FREE WATER
350.0000 mL | Freq: Four times a day (QID) | Status: DC
  Administered 2013-04-22: 350 mL

## 2013-04-22 MED ORDER — MIDAZOLAM HCL 2 MG/2ML IJ SOLN
INTRAMUSCULAR | Status: AC
  Filled 2013-04-22: qty 4

## 2013-04-22 MED ORDER — FREE WATER: 350.0000 mL | Freq: Four times a day (QID) | Status: DC

## 2013-04-22 MED ORDER — ADULT MULTIVITAMIN LIQUID CH: 5.0000 mL | Freq: Every day | ORAL | Status: DC

## 2013-04-22 MED ORDER — CHLORHEXIDINE GLUCONATE 0.12 % MT SOLN: 15.0000 mL | Freq: Two times a day (BID) | OROMUCOSAL | Status: DC

## 2013-04-22 MED ORDER — VITAMINS A & D EX OINT
TOPICAL_OINTMENT | CUTANEOUS | Status: AC
  Administered 2013-04-22: 1
  Filled 2013-04-22: qty 10

## 2013-04-22 MED ORDER — PIPERACILLIN-TAZOBACTAM 3.375 G IVPB: 3.3750 g | Freq: Three times a day (TID) | INTRAVENOUS | Status: DC

## 2013-04-22 MED ORDER — OXEPA PO LIQD: 1000.0000 mL | ORAL | Status: DC

## 2013-04-22 NOTE — Discharge Summary (Signed)
Physician Discharge Summary  Patient ID: [name] MRN: [id number] DOB/AGE: 1932/10/11 80 y.o.  Admit date: 04/04/2013 Discharge date: 04/22/2013    Discharge Diagnoses:  Acute Respiratory Failure ARDS Mucus Plugging with Respiratory Arrest Left Pneumothorax post CPR Tracheostomy Status Septic & Hypovolemic Shock Cardiac Arrest secondary to ETT obstruction Hx HTN Hx CHF / ICM s/p Pacemaker AFIB (previously on amio for years) Acute on Chronic Kidney Injury Hypernatremia Nausea / Vomiting / Diarrhea Norovirus Gastroenteritis Protein Calorie Malnutrition  Leukocytosis / Fevers Thrombocytopenia Hypothyroidism Gout (?) Hyperglycemia Acute Delirium Critical Illness Polyneuropathy Deconditioning                                                                      DISCHARGE PLAN BY DIAGNOSIS    RESPIRATORY Acute Respiratory Failure - B infiltrates > ddx = ARDS, viral pneumonitis, bacterial, consider amiodarone or other non-infectious inflammatory process. Note she has had a similar hospitalization 2-3 yrs ago (1/12). Consider COP, acute amiodarone rxn. Favor viral or non-infectious etiology.  ARDS  Mucus plug - 2/8 with resp arrest  Left pneumothorax s/p chest tube - post CPR  Tracheostomy Status - with bleeding on 2/19, resolved with thrombi pad / packing   Discharge Plan: -continue nebs  -water seal chest tube, dc once tolerating ATC continuously -continue solumedrol 40 q 24, anticipate slow wean -titrate O2 for sats >88%, SBTs with goal ATC  -thrombipad packed around trach  -continue mucinex & mucomyst   CARDIOVASCULAR  A:  Septic and Hypovolemic Shock - transiently required pressors.  Cardiac arrest 2/8 am due to ETT obstruction  Hx HTN  Hx CHF / ICM s/p Pacemaker - compensated. EF 55-60% 2/10  AFib on amiodarone for years   Discharge Plan: -no further amiodarone  -hold zocor  -continue TED hose  -PICC repositioned per IR -recommend OT to wrap upper  extremities for compression per protocol   RENAL  A:  Acute Kidney Injury - in setting of arrest & CKD.  Hypernatremia   Discharge Plan: -Hold further lasix with periods of hypotension  -trend BMP  -replace lytes as indicated  -Free water to 350 Q6   GASTROINTESTINAL  A:  Nausea / Vomiting / Diarrhea - resolved  Diarrhea - ? tube feeds ? Zosyn. C-Diff neg  Norovirus - POSITIVE 2/14   Discharge Plan: -PPI  -TF per nutrition, 40 ml /hr -will likely need PEG tube pending on patient progress with vent weaning  HEMATOLOGIC  A:  Leukocytosis - steroids initiated 2/12  DVT Prophylaxis  Thrombocytopenia - platelets returning clumped periodically   Discharge Plan: -heparin for DVT proph  -monitor cbc  -chk plts in citrate tube 2/20, discussed with lab & RN    INFECTIOUS  A:  Gastroenteritis, Diarrhea. NOROVIRUS +  R/O CAP vs Viral Respiratory Illness - PCT elevated but trending down (44->2.6), favors infectious cause. -ceftriaxone + azithro completed 2/9  RVP neg - off tamiflu  Fever - noted off abx 2/10, leucocytosis ? Steroids ? Line sepsis  Yeast UTI   Discharge Plan: -vanc/zosyn with leukocytosis, fever (2/17), If rpt resp cx neg, consider d/c abx and monitor off. No clear source. Rpt pct, was down to 2.6 on 2/13, if repeat lower, dc abx.   -ID consult requested  2/20 -follow cultures as above  -contact precautions   IMMUNO - ANA 1: 80 speckled, SSA pos ? Significance    ENDOCRINE  A:  Hypothyroidism  L Great Toe Gout - flare noted early in admit. Resolved.  Hyperglycemia - due to steroids   Discharge Plan: -continue synthroid  -SSI, resistant scale    NEUROLOGIC  A:  Delerium - CT head neg 2/11  Critical illness polyneuropathy   Discharge Plan: -PT consult  -push mobilization, passive ROM   -will need transition to oral agents for delirium / agitation to aide in weaning of IV precedex / fentanyl                  DISCHARGE SUMMARY   [name] is a 80 y.o. y/o female with a PMH of admitted 2/2 with norovirus gastroenteritis, respiratory failure, hypovolemic and presumed septic shock -source unclear. Hospital course complicated by ARDS. On 2/8 am patient suffered mucus plugging with ETT obstruction with subsequent cardiac arrest. She required CPR for approximately x 9 mins.  Patient was noted to have PTX post CPR and improved with re-intubation & left pneumothorax decompression.  She failed extubation on 2/18 due to severe deconditioning & hypoxia & required tracheostomy placement.  Unfortunately, she has had ongoing fevers that have not had clear defervescence.  Cultures & imaging are negative for overt ongoing infectious process.  Infectious Disease MD consulted for further evaluation of fevers.  See key events / studies as below.     SIGNIFICANT EVENTS / STUDIES:  2/03 - Admit with fever, SOB, vomiting, diarrhea (concern for food poisoning) & CXR concerning for interstitial pneumonitis  2/03 - 2 d echo>>>Ef 32% grade 1 diastolic dysfunction  1/22 - resp arrest with mucus plugging, ETT exchanged  2/09 - changed to PCV overnight  2/10 - febrile overnight, hypotension requiring pressors  2/11 - CT Chest/ABD/Pelvis>>ARDS, R pleural effusion, L CT, Air in sq of anterior abd with marked inflammation  2/11 - CT Head>>neg  2/14 - fever curve down but wbc up 33K . Still +4L in 7 days but even balance x 3 dauys. Resp secretions improved but cxr stil looks bad  2/16 - extubated, required bipap post extubation  2/18 - Re-intubated for resp fx 2/17 - duplex neg BLE  2/20 - to IR for PICC line, more alert    LINES / TUBES:  L IJ TLC 2/3>>>2/17  OTT2/4>>>2/8, 2/8 (mucus plug)>>>2/16, 2/18 >> 2/18  Trach 2/18 >>  A line 2/4>>2/14  OGT 2/4>>2/16  PICC 2/17>>>2/20 L PICC Revision 2/20 per IR>>>  CULTURES:  MRSA PCR 2/3>>> negative  BCx2 2/3>>>neg  Sputum 2/3>>> normal flora  UA 2/3>>> negative nit and LE  Resp Viral Panel  2/3>>>negative  2/4 tracheal aspirate>>normal flora  Stool culture, o&p 2/15>>>neg  CDiff PCR 04/11/13>>neg  ..................  BCx2 2/11>>>ng  Sputum 2/11>>>few candidate  UA 2/11>>>many yeast  UC 2/11>>>greater 100k yeast  ...................  Stool O&P 2/17>>>neg    ANTIBIOTICS:  Rocephin 2/3>>>2/9  Azithro 2/3>>>2/9  Tamiflu 2/3>>>2/4  ............  Clinda 2/12>>>2/15  Vanco 2/12>>>2/15  Zosyn 2/12>>>  Diflucan 2/12 (yeas in urine) >>>2/14  Vanc 2/17>>>   CONSULTS Dr. Baxter Flattery - ID Dr. Lenox Ahr - Radiology  Discharge Exam: General: elderly female, deconditioned and critically ill  Neuro: RASS 0, generalized weakness  HEENT: Mm pink/dry, no jvd, tstomy -bleeding appears to have stopped  Cardiovascular: s1s2 rrr, paced on monitor  Lungs: resps even non labored on vent, diminished bilaterally, left chest  tube - without air leak  Abdomen: Round/soft, bsx4 active  Musculoskeletal: No acute deformities  Skin: Warm/dry, generalized +++ edema .    Filed Vitals:   04/22/13 1000 04/22/13 1015 04/22/13 1030 04/22/13 1200  BP: 189/38 184/66 161/38 139/43  Pulse: 92 98 88 84  Temp:    100.6 F (38.1 C)  TempSrc:      Resp: $Remo'21 28 26 14  'sZsXk$ Height:      Weight:      SpO2: 98% 98% 98% 97%     Discharge Labs  BMET  Recent Labs Lab 04/17/13 0420 04/18/13 0439 04/19/13 0752 04/20/13 1159 04/20/13 2130 04/21/13 0545 04/22/13 0415  NA 152* 152* 155* 156* 150* 149* 143  K 3.6* 3.2* 3.3* 2.9* 3.1* 3.4* 3.1*  CL 110 110 112 112 108 107 104  CO2 $Re'30 31 31 30 30 30 29  'ReL$ GLUCOSE 225* 262* 191* 177* 234* 230* 226*  BUN 57* 64* 52* 43* 41* 39* 40*  CREATININE 1.42* 1.52* 1.45* 1.36* 1.34* 1.22* 1.31*  CALCIUM 8.0* 7.6* 8.0* 7.3* 7.3* 7.5* 7.3*  MG 2.5 2.3 2.6* 2.3  --  2.3  --   PHOS 4.2 4.4 4.0 4.2  --  3.1  --    CBC  Recent Labs Lab 04/19/13 0752 04/20/13 1159 04/22/13 0415 04/22/13 1029  HGB 11.4* 10.5* 9.1*  --   HCT 34.7* 33.2* 28.7*  --   WBC 38.1* 20.9*  22.7*  --   PLT PLATELET CLUMPS NOTED ON SMEAR, COUNT APPEARS ADEQUATE PLATELET CLUMPS NOTED ON SMEAR, COUNT APPEARS ADEQUATE PLATELET CLUMPS NOTED ON SMEAR, UNABLE TO ESTIMATE PLATELET CLUMPS NOTED ON SMEAR, COUNT APPEARS ADEQUATE   Anti-Coagulation  Recent Labs Lab 04/20/13 1400  INR 1.30    Discharge Orders   Future Appointments Provider Department Dept Phone   11/18/2013 12:45 PM Lyman Speller, MD [facility] [phone]   Future Orders Complete By Expires   Call MD for:  difficulty breathing, headache or visual disturbances  As directed    Call MD for:  persistant nausea and vomiting  As directed    Call MD for:  severe uncontrolled pain  As directed    Call MD for:  temperature >100.4  As directed    Discharge instructions  As directed    Comments:     Leave all lines / tubes in place for discharge.   Increase activity slowly  As directed        Follow-up Information   Follow up with Osborne Casco, MD In 1 week. (Post discharge from [facility])    Specialty:  Family Medicine   Contact information:   [address]  [place] [address] [phone]          Medication List    STOP taking these medications       amiodarone 200 MG tablet  Commonly known as:  PACERONE     Biotin 5 MG Caps     CALTRATE 600 PO     estrogens (conjugated) 0.3 MG tablet  Commonly known as:  PREMARIN     fluticasone 50 MCG/ACT nasal spray  Commonly known as:  FLONASE     furosemide 40 MG tablet  Commonly known as:  LASIX     lisinopril 10 MG tablet  Commonly known as:  PRINIVIL,ZESTRIL     loratadine 10 MG tablet  Commonly known as:  CLARITIN     metroNIDAZOLE 0.75 % cream  Commonly known as:  METROCREAM     montelukast 10 MG tablet  Commonly known as:  SINGULAIR     naproxen sodium 220 MG tablet  Commonly known as:  ANAPROX     OSTEO BI-FLEX ADV DOUBLE ST Caps     polyethylene glycol packet  Commonly known as:   MIRALAX / GLYCOLAX     simvastatin 20 MG tablet  Commonly known as:  ZOCOR     spironolactone 25 MG tablet  Commonly known as:  ALDACTONE     vitamin C 500 MG tablet  Commonly known as:  ASCORBIC ACID     VITAMIN D3 PO      TAKE these medications       acetaminophen 160 MG/5ML solution  Commonly known as:  TYLENOL  Place 20.3 mLs (650 mg total) into feeding tube every 6 (six) hours as needed for fever.     acetylcysteine 20 % nebulizer solution  Commonly known as:  MUCOMYST  Take 2 mLs by nebulization 2 (two) times daily.     aspirin 325 MG tablet  Take 325 mg by mouth daily.     chlorhexidine 0.12 % solution  Commonly known as:  PERIDEX  15 mLs by Mouth Rinse route 2 (two) times daily.     dexmedetomidine 200 MCG/50ML Soln  Commonly known as:  PRECEDEX  Inject 34.92-104.76 mcg/hr into the vein continuous.     feeding supplement (OXEPA) Liqd  Place 1,000 mLs into feeding tube daily. Rate of 40 ml/hr     feeding supplement (PRO-STAT SUGAR FREE 64) Liqd  Place 30 mLs into feeding tube 3 (three) times daily.     fentaNYL 0.05 MG/ML injection  Commonly known as:  SUBLIMAZE  Inject 1-2 mLs (50-100 mcg total) into the vein every 2 (two) hours as needed for severe pain (or RASS > 0).     folic acid 1 MG tablet  Commonly known as:  FOLVITE  Take 1 mg by mouth daily.     free water Soln  Place 350 mLs into feeding tube every 6 (six) hours.     heparin 5000 UNIT/ML injection  Inject 1 mL (5,000 Units total) into the skin every 8 (eight) hours.     insulin aspart 100 UNIT/ML injection  Commonly known as:  novoLOG  Inject 0-15 Units into the skin every 4 (four) hours.     ipratropium-albuterol 0.5-2.5 (3) MG/3ML Soln  Commonly known as:  DUONEB  Take 3 mLs by nebulization every 6 (six) hours.     levothyroxine 75 MCG tablet  Commonly known as:  SYNTHROID, LEVOTHROID  Take 75 mcg by mouth daily after breakfast.     methylPREDNISolone sodium succinate 40 mg/mL  injection  Commonly known as:  SOLU-MEDROL  Inject 1 mL (40 mg total) into the vein daily.     multivitamin Liqd  Place 5 mLs into feeding tube daily.     pantoprazole sodium 40 mg/20 mL Pack  Commonly known as:  PROTONIX  Place 20 mLs (40 mg total) into feeding tube daily.     piperacillin-tazobactam 3.375 GM/50ML IVPB  Commonly known as:  ZOSYN  Inject 50 mLs (3.375 g total) into the vein every 8 (eight) hours.     sodium chloride 0.9 % SOLN 200 mL with fentaNYL 0.05 MG/ML SOLN 2,500 mcg  Titrate up to 200 mcg/hr for comfort.     sodium chloride 0.9 % SOLN 250 mL with vancomycin 10 G SOLR  $Rem'1250mg'CsQo$  Q24 hours. To be dosed per pharmacy  Disposition:  [facility] in [place]   Discharged Condition: [name] has met maximum benefit of inpatient care and is medically stable and cleared for discharge.  Patient is pending follow up as above.      Time spent on disposition:  Greater than 35 minutes.   Signed: Noe Gens, NP-C [facility] Pgr: [phone] Office: [phone]   Independently examined pt, evaluated data & formulated above discharge care plan with NP who scribed this note & edited by me.  Islam Villescas V.

## 2013-04-22 NOTE — Procedures (Signed)
L PICC revision, tip SVC/RA jct No complication No blood loss. See complete dictation in [facility].

## 2013-04-22 NOTE — Progress Notes (Signed)
Name: [name] MRN: [id number] DOB: [birth year]   LOS 18 days   ADMISSION DATE:  04/04/2013 CONSULTATION DATE:  04/05/13  REFERRING MD :  Dr. Vanessa Barbara  PRIMARY SERVICE: TRH-->PCCM  CHIEF COMPLAINT:  Hypotension   BRIEF PATIENT DESCRIPTION: 80 y/o F admitted 2/2 with norovirus gastroenteritis,respiratory failure, hypovolemic and presumed septic shock -source unclear.  Course complicated by ARDS.  2/8 am suffered mucus plugging with ETT obstruction with subsequent cardiac arrest.  Required CPR x 9 mins, improved with re-intubation & left pneumothorax decompression. Failed extubation on 2/18 due to severe deconditioning & hypoxia & required tstomy  SIGNIFICANT EVENTS / STUDIES:  2/03 - Admit with fever, SOB, vomiting, diarrhea (concern for food poisoning) & CXR concerning for interstitial pneumonitis 2/03 - 2 d echo>>>Ef 45% grade 1 diastolic dysfunction  2/08 - resp arrest with mucus plugging, ETT exchanged 2/09 - changed to PCV overnight 2/10 - febrile overnight, hypotension requiring pressors 2/11 - CT Chest/ABD/Pelvis>>ARDS, R pleural effusion, L CT, Air in sq of anterior abd with marked inflammation 2/11 - CT Head>>neg 2/14 - fever curve down but wbc up 33K . Still +4L in 7 days but even balance x 3 dauys. Resp secretions improved but cxr stil looks bad 2/16 - extubated, required bipap post extubation  2/17 duplex neg BLE 2/20 - to IR for PICC line, more alert  LINES / TUBES: L IJ TLC 2/3>>>2/17 OTT2/4>>>2/8, 2/8 (mucus plug)>>>2/16, 2/18 >> 2/18 Trach 2/18 >> A line 2/4>>2/14 OGT 2/4>>2/16 PICC 2/17>>>  CULTURES: MRSA PCR 2/3>>> negative BCx2 2/3>>>neg Sputum 2/3>>> normal flora UA 2/3>>> negative nit and LE Resp Viral Panel 2/3>>>negative 2/4 tracheal aspirate>>normal flora Stool culture, o&p 2/15>>>neg  CDiff PCR 04/11/13>>neg .................. BCx2 2/11>>>ng Sputum 2/11>>>few candidate UA 2/11>>>many yeast UC 2/11>>>greater 100k yeast ................... Stool  O&P 2/17>>>neg  ANTIBIOTICS: Rocephin 2/3>>>2/9 Azithro 2/3>>>2/9 Tamiflu 2/3>>>2/4 ............ Clinda 2/12>>>2/15 Vanco 2/12>>>2/15 Zosyn 2/12>>> Diflucan 2/12 (yeas in urine) >>>2/14 Vanc 2/17>>>   SUBJECTIVE:  Hypotension overnight, resolved with fluids. Remains febrile.    VITAL SIGNS: Temp:  [98.4 F (36.9 C)-102.2 F (39 C)] 100.8 F (38.2 C) (02/20 0900) Pulse Rate:  [70-102] 102 (02/20 0900) Resp:  [15-40] 27 (02/20 0900) BP: (69-191)/(19-134) 170/47 mmHg (02/20 0900) SpO2:  [88 %-100 %] 88 % (02/20 0900) FiO2 (%):  [60 %-70 %] 70 % (02/20 0815) Weight:  [192 lb 7.4 oz (87.3 kg)] 192 lb 7.4 oz (87.3 kg) (02/20 0500)  HEMODYNAMICS:    VENTILATOR SETTINGS: Vent Mode:  [-] PRVC FiO2 (%):  [60 %-70 %] 70 % Set Rate:  [12 bmp-14 bmp] 14 bmp Vt Set:  [500 mL] 500 mL PEEP:  [5 cmH20] 5 cmH20 Plateau Pressure:  [7 cmH20-23 cmH20] 7 cmH20  INTAKE / OUTPUT: Intake/Output     02/19 0701 - 02/20 0700 02/20 0701 - 02/21 0700   I.V. (mL/kg) 2153.4 (24.7) 60 (0.7)   NG/GT 960    IV Piggyback 850    Total Intake(mL/kg) 3963.4 (45.4) 60 (0.7)   Urine (mL/kg/hr) 2025 (1)    Stool 210 (0.1)    Chest Tube 100 (0)    Total Output 2335     Net +1628.4 +60         PHYSICAL EXAMINATION: General: elderly female, deconditioned and critically ill  Neuro:   RASS 0, generalized weakness  HEENT:  Mm pink/dry, no jvd, tstomy -bleeding appears to have stopped Cardiovascular:  s1s2 rrr, paced on monitor Lungs:  resps even non labored on vent, diminished bilaterally,  left chest tube - without air leak Abdomen:  Round/soft, bsx4 active Musculoskeletal:  No acute deformities  Skin:  Warm/dry, generalized +++ edema .    LABS:  PULMONARY  Recent Labs Lab 04/18/13 0930 04/20/13 1247  PHART 7.500* 7.565*  PCO2ART 39.3 34.4*  PO2ART 61.6* 68.4*  HCO3 29.9* 31.2*  TCO2 26.4 28.0  O2SAT 89.2 93.7   CBC  Recent Labs Lab 04/19/13 0752 04/20/13 1159 04/22/13 0415  HGB  11.4* 10.5* 9.1*  HCT 34.7* 33.2* 28.7*  WBC 38.1* 20.9* 22.7*  PLT PLATELET CLUMPS NOTED ON SMEAR, COUNT APPEARS ADEQUATE PLATELET CLUMPS NOTED ON SMEAR, COUNT APPEARS ADEQUATE PLATELET CLUMPS NOTED ON SMEAR, UNABLE TO ESTIMATE   CARDIAC  Recent Labs Lab 04/16/13 0955 04/16/13 1638 04/17/13 0025  TROPONINI <0.30 <0.30 <0.30    Recent Labs Lab 04/16/13 0430  PROBNP 3345.0*   CHEMISTRY  Recent Labs Lab 04/17/13 0420 04/18/13 0439 04/19/13 0752 04/20/13 1159 04/20/13 2130 04/21/13 0545 04/22/13 0415  NA 152* 152* 155* 156* 150* 149* 143  K 3.6* 3.2* 3.3* 2.9* 3.1* 3.4* 3.1*  CL 110 110 112 112 108 107 104  CO2 30 31 31 30 30 30 29   GLUCOSE 225* 262* 191* 177* 234* 230* 226*  BUN 57* 64* 52* 43* 41* 39* 40*  CREATININE 1.42* 1.52* 1.45* 1.36* 1.34* 1.22* 1.31*  CALCIUM 8.0* 7.6* 8.0* 7.3* 7.3* 7.5* 7.3*  MG 2.5 2.3 2.6* 2.3  --  2.3  --   PHOS 4.2 4.4 4.0 4.2  --  3.1  --    Estimated Creatinine Clearance: 37.1 ml/min (by C-G formula based on Cr of 1.31).  LIVER  Recent Labs Lab 04/16/13 0955 04/20/13 1400  AST 20  --   ALT 22  --   ALKPHOS 53  --   BILITOT 0.2*  --   PROT 5.6*  --   ALBUMIN 1.6*  --   INR  --  1.30   INFECTIOUS  Recent Labs Lab 04/16/13 1010 04/18/13 0439 04/21/13 2015  LATICACIDVEN 2.5* 2.4* 2.5*   ENDOCRINE CBG (last 3)   Recent Labs  04/21/13 2355 04/22/13 0402 04/22/13 0735  GLUCAP 216* 222* 149*   IMAGING x48h  Dg Chest Port 1 View  04/22/2013   CLINICAL DATA:  Followup the infiltrates and chest drain.  EXAM: PORTABLE CHEST - 1 VIEW  COMPARISON:  04/21/2013 and multiple previous.  FINDINGS: Tracheostomy, nasogastric tube, left arm PICC and left chest drain appear unchanged. PICC tip is difficult to clearly identify because of overlapping densities related to pacemaker. No pneumothorax or hemothorax. Bilateral pulmonary infiltrates persist, possibly with slightly more atelectasis on the left in general and in the right  upper lobe.  IMPRESSION: Lines and tubes appear unchanged. No pneumothorax. Diffuse pulmonary density with slight worsening volume loss in the left lung and right upper lobe.   Electronically Signed   By: Paulina Fusi M.D.   On: 04/22/2013 07:23   Dg Chest Port 1 View  04/21/2013   CLINICAL DATA:  Respiratory failure.  EXAM: PORTABLE CHEST - 1 VIEW  COMPARISON:  DG CHEST 1V PORT dated 04/20/2013  FINDINGS: Support devices are stable. Left pigtail drainage catheter remains in place. No visible pneumothorax.  Diffuse interstitial and alveolar opacities again noted, stable. Mild cardiomegaly. No acute bony abnormality.  IMPRESSION: Stable support devices and appearance of the lungs.   Electronically Signed   By: Charlett Nose M.D.   On: 04/21/2013 06:30   Chest Portable 1 View To  Assess Tube Placement And Rule-out Pneumothorax  04/20/2013   CLINICAL DATA:  Tube placement.  EXAM: PORTABLE CHEST - 1 VIEW  COMPARISON:  DG CHEST 1V PORT dated 04/20/2013  FINDINGS: Interim removal of endotracheal tube and placement of tracheostomy tube. Tracheostomy tube in good anatomic position. NG tube tip noted below left hemidiaphragm. Left chest tube in stable position. No pneumothorax. Heart size normal. Cardiac pacer. No pulmonary venous congestion. Bilateral pulmonary infiltrates are present. These remain severe. No acute osseous abnormality.  IMPRESSION: 1. Interim placement of tracheostomy tube, tracheostomy tube in good anatomic position. NG tube and left chest tube in stable position. 2. Dense bilateral persistent pulmonary infiltrates. No pneumothorax.   Electronically Signed   By: Maisie Fushomas  Register   On: 04/20/2013 16:10   Portable Chest Xray  04/20/2013   CLINICAL DATA:  Endotracheal tube placed  EXAM: PORTABLE CHEST - 1 VIEW  COMPARISON:  Chest x-ray from the same day at 4:39 a.m.  FINDINGS: New endotracheal tube, tip in the distal thoracic trachea, 2 cm above the carina. New orogastric tube enters the stomach at  least. Left-sided chest tube and left approach biventricular pacer/ICD shows no interval change.  No cardiomegaly. Diffuse interstitial and airspace disease persists. No evidence of increasing pleural fluid or pneumothorax.  IMPRESSION: 1. New endotracheal tube in good position. 2. Unchanged diffuse interstitial and airspace disease.   Electronically Signed   By: Tiburcio PeaJonathan  Watts M.D.   On: 04/20/2013 12:43   Dg Abd Portable 1v  04/20/2013   CLINICAL DATA:  Nasogastric tube placement.  EXAM: PORTABLE ABDOMEN - 1 VIEW  COMPARISON:  None.  FINDINGS: A nasogastric tube extends into the distal stomach/ proximal duodenum. The underlying bowel gas pattern is unremarkable.  IMPRESSION: Nasogastric tube extends into the distal stomach/ proximal duodenum.   Electronically Signed   By: Irish LackGlenn  Yamagata M.D.   On: 04/20/2013 13:03    ASSESSMENT / PLAN:  PULMONARY A: Acute Respiratory Failure - B infiltrates > ddx = ARDS, viral pneumonitis, bacterial, consider amiodarone or other non-infectious inflammatory process.  Note she has had a similar hospitalization 2-3 yrs ago (1/12). Consider COP, acute amiodarone rxn.  Favor viral or non-infectious etiology. ARDS  Mucus plug - 2/8 with resp arrest Left pneumothorax s/p chest tube - post CPR Tracheostomy Status - with bleeding on 2/19, resolved with thrombi pad / packing  P:   -continue nebs -water seal chest tube, dc once on ATC -continue solumedrol 40 q 24 -titrate O2 for sats >88%,SBTs with goal ATC -cont to hold amiodarone  -thrombipad packed around trach -add mucinex & mucomyst  CARDIOVASCULAR A:  Septic and Hypovolemic Shock - transiently required pressors.   Cardiac arrest 2/8 am due to ETT obstruction Hx HTN Hx CHF / ICM s/p Pacemaker - compensated.  EF 55-60% 2/10 AFib on amiodarone for years  P:  -no further amiodarone -hold zocor -place TED hose -PICC malpositioned likely r/t pacer wires -- exchange for midline per IV team or have IR  reposition.    RENAL A:   Acute Kidney Injury - in setting of arrest. Mild rise in creat with lasix with hypernatremia now improving  Hypernatremia  P:   -D/C D5w, hold further lasix with hypotension -trend BMP -replace lytes as indicated  -increase free water to 350 Q6  GASTROINTESTINAL A:   Nausea / Vomiting / Diarrhea - resolved Diarrhea - ? tube feeds ? Zosyn. C-Diff neg Norovirus - POSITIVE 2/14 P:   -PPI -TF per nutrition  HEMATOLOGIC A:   Leukocytosis - steroids initiated 2/12 DVT Prophylaxis  Thrombocytopenia - platelets returning clumped periodically  P:  -heparin for DVT proph -monitor cbc -chk plts in citrate tube 2/20, discussed with lab & RN -ongoing fevers, ??consider hematologic process  INFECTIOUS A:   Gastroenteritis, Diarrhea.  NOROVIRUS + R/O CAP vs Viral Respiratory Illness - PCT elevated but trending down (44->2.6), favors infectious cause. -ceftriaxone + azithro completed 2/9 RVP neg - off tamiflu   Fever - noted off abx 2/10, leucocytosis ? Steroids ? Line sepsis Yeast UTI   P:   -vanc/zosyn with leukocytosis, fever (2/17), If rpt resp cx neg, consider d/c abx and monitor off.  No clear source. Rpt pct, was down to 2.6 on 2/13 , if lower, dc abx -follow cultures as above -contact precautions   IMMUNO - ANA 1: 80 speckled, SSA pos ? Significance  ENDOCRINE A:   Hypothyroidism   Gout  - flare noted early in admit.  Hyperglycemia - due to steroids  P:   -continue synthroid -SSI, resistant scale   NEUROLOGIC A:   Delerium - CT head neg 2/11 Critical illness polyneuropathy P:   -PT consult -push mobilization    GLOBAL: -Severe deconditioning in setting of critical illness, resolving ARDS.  -Push SBt efforts to ATC then PT -plan for LTAC   Canary Brim, NP-C [facility] Pgr: [phone] or [phone]    *Care during the described time interval was provided by me and/or other providers on the critical  care team. I have reviewed this patient's available data, including medical history, events of note, physical examination and test results as part of my evaluation.  Cc time x 35 m  Nakhia Levitan V.

## 2013-04-22 NOTE — Progress Notes (Signed)
ANTIBIOTIC CONSULT NOTE - FOLLOW UP  Pharmacy Consult for Zosyn, Vancomycin, antibiotic renal dose adjustment Indication: rule out sepsis  Allergies  Allergen Reactions  . Beta Adrenergic Blockers Other (See Comments)    Not working well for pt.  Sherrie Mustache Hcl] Shortness Of Breath  . Carvedilol Shortness Of Breath    Chest  Discomfort.  . Dapsone Other (See Comments)    Kidney  failure  . Diclofenac Sodium Other (See Comments)    Causes  Fluid  Retention.  Consuelo Pandy [Pitavastatin Calcium] Shortness Of Breath  . Methotrexate Shortness Of Breath    Mouth sores.  . Metoprolol Succinate Hives  . Oxaprozin Diarrhea  . Other     Fragrance mixtures -  Cinol botanicals  &  Plants oil. Cocamidoproy   -   Betaine. Lipstick & other makeup & cleaning materials.   Marland Kitchen Epinephrine     Patient Measurements: Height: 5\' 3"  (160 cm) Weight: 192 lb 7.4 oz (87.3 kg) IBW/kg (Calculated) : 52.4  Vital Signs: Temp: 100.8 F (38.2 C) (02/20 0900) Temp src: Core (Comment) (02/20 0400) BP: 184/66 mmHg (02/20 1015) Pulse Rate: 98 (02/20 1015) Intake/Output from previous day: 02/19 0701 - 02/20 0700 In: 3963.4 [I.V.:2153.4; NG/GT:960; IV Piggyback:850] Out: 2335 [Urine:2025; Stool:210; Chest Tube:100]  Labs:  Recent Labs  04/20/13 1159 04/20/13 2130 04/21/13 0545 04/22/13 0415  WBC 20.9*  --   --  22.7*  HGB 10.5*  --   --  9.1*  PLT PLATELET CLUMPS NOTED ON SMEAR, COUNT APPEARS ADEQUATE  --   --  PLATELET CLUMPS NOTED ON SMEAR, UNABLE TO ESTIMATE  CREATININE 1.36* 1.34* 1.22* 1.31*   Estimated Creatinine Clearance: 37.1 ml/min (by C-G formula based on Cr of 1.31).  Recent Labs  04/22/13 0900  VANCOTROUGH 17.1     Anti-infectives: 2/2 >> zmax >> 2/8 2/2 >> rocephin >> 2/8 2/2 >> tamiflu >>2/4 2/11 >> clinda >> 2/15 2/11 >> vanc >> 2/15 2/11 >> zosyn >> 2/12 >> Diflucan (yeast in urine) >> 2/14 2/16 >> Metronidazole (MD) >> 2/17 2/17 >> Vanc  >>  Assessment: 35 yoF admitted 2/2 with respiratory failure and probable CAP.  Completed 7 days of azithromycin and Ceftriaxone on 2/8.  On 2/8, suffered mucus plugging with ETT obstruction with subsequent cardiac arrest.  Broad spectrum antibiotics (Zosyn, Vanc, Clinda) were started 2/11 for coverage of possible sepsis, but were narrowed to Zosyn alone on 2/15 with negative cultures.  Pharmacy is consulted to resume vancomycin dosing 2/17.     Day # 10 Zosyn and Day #4 Vancomycin (restarted)  Tmax: 102.2, Tc 100.8  WBCs: 22.7  AoCKD: SCr 1.31 with CrCl ~ 37 ml/min  Goal of Therapy:  Vancomycin trough level 15-20 mcg/ml Appropriate abx dosing, eradication of infection.   Plan:   Continue Zosyn 3.375g IV Q8H infused over 4hrs.  Continue Vancomycin 1250mg  IV q24h.  Recheck vancomycin trough level as needed.  Follow up renal fxn and culture results.   Lynann Beaver PharmD, BCPS Pager [phone] 04/22/2013 10:29 AM

## 2013-04-23 ENCOUNTER — Other Ambulatory Visit (HOSPITAL_COMMUNITY): Payer: Self-pay

## 2013-04-23 DIAGNOSIS — J9600 Acute respiratory failure, unspecified whether with hypoxia or hypercapnia: Secondary | ICD-10-CM

## 2013-04-23 LAB — BASIC METABOLIC PANEL
BUN: 38 mg/dL — AB (ref 6–23)
CO2: 28 mEq/L (ref 19–32)
Calcium: 7.7 mg/dL — ABNORMAL LOW (ref 8.4–10.5)
Chloride: 107 mEq/L (ref 96–112)
Creatinine, Ser: 1.28 mg/dL — ABNORMAL HIGH (ref 0.50–1.10)
GFR calc Af Amer: 45 mL/min — ABNORMAL LOW (ref >90)
GFR, EST NON AFRICAN AMERICAN: 39 mL/min — AB (ref >90)
Glucose, Bld: 151 mg/dL — ABNORMAL HIGH (ref 70–99)
POTASSIUM: 3.8 meq/L (ref 3.7–5.3)
SODIUM: 148 meq/L — AB (ref 137–147)

## 2013-04-23 LAB — CBC
HCT: 26.8 % — ABNORMAL LOW (ref 36.0–46.0)
Hemoglobin: 8.4 g/dL — ABNORMAL LOW (ref 12.0–15.0)
MCH: 30.1 pg (ref 26.0–34.0)
MCHC: 31.3 g/dL (ref 30.0–36.0)
MCV: 96.1 fL (ref 78.0–100.0)
PLATELETS: ADEQUATE 10*3/uL (ref 150–400)
RBC: 2.79 MIL/uL — ABNORMAL LOW (ref 3.87–5.11)
RDW: 15.8 % — ABNORMAL HIGH (ref 11.5–15.5)
WBC: 20.4 10*3/uL — ABNORMAL HIGH (ref 4.0–10.5)

## 2013-04-23 LAB — PREALBUMIN: Prealbumin: 10.4 mg/dL — ABNORMAL LOW (ref 17.0–34.0)

## 2013-04-23 LAB — PROCALCITONIN: PROCALCITONIN: 0.62 ng/mL

## 2013-04-23 NOTE — Op Note (Signed)
NAMEANHAR, RABORN NO.:  [id number]  MEDICAL RECORD NO.:  [id number]  LOCATION:  1225                         FACILITY:  [facility]  PHYSICIAN:  Nelda Bucks, MD DATE OF BIRTH:  [birth year]  DATE OF PROCEDURE:  04/20/2013 DATE OF DISCHARGE:  04/22/2013                              OPERATIVE REPORT   This is a percutaneous tracheostomy.  Consent was obtained from the patient's son and husband, fully aware of risks and benefits of the procedure.  PREOPERATIVE DIAGNOSES:  Acute respiratory distress syndrome, pneumothorax, failed extubation.  POSTOPERATIVE DIAGNOSES:  Status post tracheostomy secondary to severe acute respiratory distress syndrome.  BRONCHOSCOPIST FOR THE PROCEDURE:  Oley Balm. Simonds, MD  DESCRIPTION OF PROCEDURE:  The patient was placed in supine position. Chlorhexidine preparation was used to sterilize the operative site over the 2nd and 3rd endotracheal space.  The bronchoscopist placed the bronchoscope and backed up to approximately 17 cm.  Lidocaine of 6 mL plus epinephrine was injected into the operative site.  The 1st assistant for this procedure was Connye Burkitt, PA student.  A 1 cm vertical incision was made over the 2nd endotracheal space.  Dissection was made down to the tracheal planes through the strap muscles.  No apparent vascular structures.  An 18-gauge needle with white catheter sheath placed into the airway, directly visualized by the bronchoscopist without any posterior wall injury.  Needles were removed.  Wire was placed in the white catheter sheath and the white catheter sheath was removed.  A 14-French punch dilator was taken in and out of the airway. A progressive rhino dilator to approximately 30-French was placed over a glider and in and out of the airway.  The glider and wire remained.  A size trach 6 26-French dilator was placed over the glider and wire successfully into the airway and removed except the  tracheostomy. Tracheostomy was then sutured in place with 4 monofilament sutures. Blood loss for the procedure was less than 1 mL.  Portable chest x-ray postoperatively showed a well-placed tracheostomy with no apparent complications.  The patient can follow up in the Percutaneous Tracheostomy Clinic by calling [phone].     Nelda Bucks, MD     DJF/MEDQ  D:  04/22/2013  T:  04/23/2013  Job:  [id number]

## 2013-04-23 NOTE — Consult Note (Signed)
INFECTIOUS DISEASE  Date of Admission:  04/22/2013   Impression/Recommendation Fever  So far she has been afebrile in [facility].  Would continue to watch her temp curve. If temps recur: Repeat BCx, UCx,  Recheck C diff pcr Check amylase and lipase Check CK (has been on zocor) ? HIT Consider dopplers of her UE and LE (- 04-19-13) to eval for clots.   Comment- Very complicated pt with fevers for many days.   Thank you for this consult,   [name] (pager) [phone] [email or web address]  [name] is an 80 y.o. female.  HPI: 81 yo F adm to [facility] on 2-3 with f/c, N/V, diarrhea, and SOB. She was found to be hypoxic/using accessory muscles, and  Hypotensive. She was treated with anbx and tamiflu (flu panel -, completed 2-9), required intubation (due to mucous plugging) and on 2-8 suffered MI. She developed PTX after cardiopulmonary resuscitation. She required tracheostomy and had persistent fevers in hospital with unclear source.  She was transferred to [facility] on 04-22-13. Has been afebrile since.   Past Medical History  Diagnosis Date  . Hypothyroid   . Complete heart block 2002    Initial PPM 2002 with upgrade by JA to CRT-P 11/21/08  . Nonischemic cardiomyopathy   . Persistent atrial fibrillation   . Hypertension   . Hyperlipidemia   . Hives 4/10  . Cataract 4/13    right  . Renal failure     3rd stage-seeing neurologist    Past Surgical History  Procedure Laterality Date  . Pacemaker insertion  2002, 2010    PPM 2002, upgrade to CRT-P by Dr Rayann Heman (MDT 2010)  . Partial colectomy  1/04  . Abdominal hysterectomy  1987    TAH  . Bunionectomy Right 1996  . Breast biopsy Left 01/1997  . Cataract extraction  4/13    both eyes  . Cyst excision      on back of left ear     Allergies  Allergen Reactions  . Beta Adrenergic Blockers Other (See Comments)    Not working well for pt.  Thayer Jew Hcl] Shortness Of Breath  . Carvedilol Shortness Of  Breath    Chest  Discomfort.  . Dapsone Other (See Comments)    Kidney  failure  . Diclofenac Sodium Other (See Comments)    Causes  Fluid  Retention.  Chanda Busing [Pitavastatin Calcium] Shortness Of Breath  . Methotrexate Shortness Of Breath    Mouth sores.  . Metoprolol Succinate Hives  . Oxaprozin Diarrhea  . Other     Fragrance mixtures -  Cinol botanicals  &  Plants oil. Cocamidoproy   -   Betaine. Lipstick & other makeup & cleaning materials.   Marland Kitchen Epinephrine     Medications: I have reviewed the patient's current medications. (vanco.zosyn, folate, precedex, fentanyl, acetylcysteine, protonix, methylprednisone, synthroid, combivent, sub Q heparin, lasix, chlorhexidine, aspirin).   Total days of antibiotics:  Rocephin 2/3>>>2/9  Azithro 2/3>>>2/9  Tamiflu 2/3>>>2/4  ............  Clinda 2/12>>>2/15  Vanco 2/12>>>2/15  Zosyn 2/12>>>  Diflucan 2/12 (yeas in urine) >>>2/14  Vanc 2/17>>>         Social History:  reports that she has never smoked. She has never used smokeless tobacco. She reports that she does not drink alcohol or use illicit drugs.  Family History  Problem Relation Age of Onset  . Diabetes Maternal Aunt   . Breast cancer Maternal Aunt   . Stroke Mother   . Stroke Father   .  Hypothyroidism Sister   . Hypothyroidism Son   . Hypothyroidism Mother   . Hypertension Father   . Kidney disease Father     renal problems    General ROS: loose stools, UE anasarca, see HPI.   Last menstrual period 03/03/1985. General appearance: alert, cooperative, no distress and animated Eyes: negative findings: conjunctivae and sclerae normal and pupils equal, round, reactive to light and accomodation Throat: normal findings: oropharynx pink & moist without lesions or evidence of thrush Neck: no adenopathy, supple, symmetrical, trachea midline and trach site clean.  Lungs: wheezes anterior - bilateral and inspiratory Heart: regular rate and rhythm Abdomen: normal  findings: bowel sounds normal and soft, non-tender and distended Extremities: anasarca. LUE PIC clean.  L chest tube site is clean.    Results for orders placed during the hospital encounter of 04/22/13 (from the past 48 hour(s))  CBC     Status: Abnormal   Collection Time    04/23/13  5:40 AM      Result Value Ref Range   WBC 20.4 (*) 4.0 - 10.5 K/uL   RBC 2.79 (*) 3.87 - 5.11 MIL/uL   Hemoglobin 8.4 (*) 12.0 - 15.0 g/dL   HCT 26.8 (*) 36.0 - 46.0 %   MCV 96.1  78.0 - 100.0 fL   MCH 30.1  26.0 - 34.0 pg   MCHC 31.3  30.0 - 36.0 g/dL   RDW 15.8 (*) 11.5 - 15.5 %   Platelets    150 - 400 K/uL   Value: PLATELET CLUMPS NOTED ON SMEAR, COUNT APPEARS ADEQUATE   Comment: SPECIMEN CHECKED FOR CLOTS  BASIC METABOLIC PANEL     Status: Abnormal   Collection Time    04/23/13  5:40 AM      Result Value Ref Range   Sodium 148 (*) 137 - 147 mEq/L   Potassium 3.8  3.7 - 5.3 mEq/L   Chloride 107  96 - 112 mEq/L   CO2 28  19 - 32 mEq/L   Glucose, Bld 151 (*) 70 - 99 mg/dL   BUN 38 (*) 6 - 23 mg/dL   Creatinine, Ser 1.28 (*) 0.50 - 1.10 mg/dL   Calcium 7.7 (*) 8.4 - 10.5 mg/dL   GFR calc non Af Amer 39 (*) >90 mL/min   GFR calc Af Amer 45 (*) >90 mL/min   Comment: (NOTE)     The eGFR has been calculated using the CKD EPI equation.     This calculation has not been validated in all clinical situations.     eGFR's persistently <90 mL/min signify possible Chronic Kidney     Disease.  PREALBUMIN     Status: Abnormal   Collection Time    04/23/13  5:40 AM      Result Value Ref Range   Prealbumin 10.4 (*) 17.0 - 34.0 mg/dL   Comment: Performed at [facility]     Status: None   Collection Time    04/23/13  5:40 AM      Result Value Ref Range   Procalcitonin 0.62     Comment:            Interpretation:     PCT > 0.5 ng/mL and <= 2 ng/mL:     Systemic infection (sepsis) is possible,     but other conditions are known to elevate     PCT as well.     (NOTE)  ICU PCT Algorithm               Non ICU PCT Algorithm        ----------------------------     ------------------------------             PCT < 0.25 ng/mL                 PCT < 0.1 ng/mL         Stopping of antibiotics            Stopping of antibiotics           strongly encouraged.               strongly encouraged.        ----------------------------     ------------------------------           PCT level decrease by               PCT < 0.25 ng/mL           >= 80% from peak PCT           OR PCT 0.25 - 0.5 ng/mL          Stopping of antibiotics                                                 encouraged.         Stopping of antibiotics               encouraged.        ----------------------------     ------------------------------           PCT level decrease by              PCT >= 0.25 ng/mL           < 80% from peak PCT            AND PCT >= 0.5 ng/mL            Continuing antibiotics                                                  encouraged.           Continuing antibiotics                encouraged.        ----------------------------     ------------------------------         PCT level increase compared          PCT > 0.5 ng/mL             with peak PCT AND              PCT >= 0.5 ng/mL             Escalation of antibiotics                                              strongly encouraged.          Escalation of antibiotics  strongly encouraged.      Component Value Date/Time   SDES STOOL 04/17/2013 0600   SPECREQUEST NONE 04/17/2013 0600   CULT  Value: FEW CANDIDA ALBICANS Performed at [facility] 04/13/2013 1113   REPTSTATUS 04/19/2013 FINAL 04/17/2013 0600   Ir Pta Venous Left  04/22/2013   CLINICAL DATA:  Respiratory distress. Poor venous access. Bedside PICC could not be advanced beyond the axillary vein. Left subclavian pacemaker  EXAM: PICC PLACEMENT WITH  FLUOROSCOPY  VENOUS ANGIOPLASTY  TECHNIQUE: After written informed consent was obtained from  the spouse, patient was placed in the supine position on angiographic table. The previously placed left upper arm midline catheter and surrounding skin were prepped using maximum barrier technique including cap and mask, sterile gown, sterile gloves, large sterile sheet, and Chlorhexidine as cutaneous antisepsis. The region was infiltrated locally with 1% lidocaine. The midline catheter was cut and a 018 guidewire was advanced. There was initially some resistance at the level of the central left subclavian vein, but ultimately the wire advanced into the SVC demonstrating patency of the central veins. The catheter was removed and a peel-away sheath placed. However, the PICC line would not advance beyond the central left subclavian vein. For this reason, exchanges allowed advancement of a 6 [place] vascular sheath which passed easily through the innominate vein. However, the PICC still would not advance completely into the SVC. For this reason, exchanges performed to allow placement of a 4 mm x 4 cm Mustang angioplasty balloon, use to dilate the central left subclavian and innominate veins. Ultimately, this allowed advancement of a 5-French double-lumen power injectable PICC trimmed to 43 cm, positioned with its tip near the cavoatrial junction. Both lumens flushed and aspirated easily. Spot chest radiograph confirms appropriate catheter position. Catheter was flushed per protocol and secured externally with 0-Prolene suture. The patient tolerated procedure well, with no immediate complication.  FLUOROSCOPY TIME:  4 min 6 seconds  IMPRESSION: Technically successful revision of left arm midline catheter to a 43 cm double lumen power injectable PICC, tip at cavoatrial junction, requiring 4 mm PTA of left subclavian and innominate stenoses. Recommend right arm approach for future PICC lines.   Electronically Signed   By: Arne Cleveland M.D.   On: 04/22/2013 11:22   Ir Fluoro Guide Cv Line Left  04/22/2013   CLINICAL  DATA:  Respiratory distress. Poor venous access. Bedside PICC could not be advanced beyond the axillary vein. Left subclavian pacemaker  EXAM: PICC PLACEMENT WITH  FLUOROSCOPY  VENOUS ANGIOPLASTY  TECHNIQUE: After written informed consent was obtained from the spouse, patient was placed in the supine position on angiographic table. The previously placed left upper arm midline catheter and surrounding skin were prepped using maximum barrier technique including cap and mask, sterile gown, sterile gloves, large sterile sheet, and Chlorhexidine as cutaneous antisepsis. The region was infiltrated locally with 1% lidocaine. The midline catheter was cut and a 018 guidewire was advanced. There was initially some resistance at the level of the central left subclavian vein, but ultimately the wire advanced into the SVC demonstrating patency of the central veins. The catheter was removed and a peel-away sheath placed. However, the PICC line would not advance beyond the central left subclavian vein. For this reason, exchanges allowed advancement of a 6 [place] vascular sheath which passed easily through the innominate vein. However, the PICC still would not advance completely into the SVC. For this reason, exchanges performed to allow placement of a 4 mm x 4 cm  Mustang angioplasty balloon, use to dilate the central left subclavian and innominate veins. Ultimately, this allowed advancement of a 5-French double-lumen power injectable PICC trimmed to 43 cm, positioned with its tip near the cavoatrial junction. Both lumens flushed and aspirated easily. Spot chest radiograph confirms appropriate catheter position. Catheter was flushed per protocol and secured externally with 0-Prolene suture. The patient tolerated procedure well, with no immediate complication.  FLUOROSCOPY TIME:  4 min 6 seconds  IMPRESSION: Technically successful revision of left arm midline catheter to a 43 cm double lumen power injectable PICC, tip at cavoatrial  junction, requiring 4 mm PTA of left subclavian and innominate stenoses. Recommend right arm approach for future PICC lines.   Electronically Signed   By: Arne Cleveland M.D.   On: 04/22/2013 11:22   Dg Chest Port 1 View  04/23/2013   CLINICAL DATA:  Respiratory failure  EXAM: PORTABLE CHEST - 1 VIEW  COMPARISON:  04/22/2013  FINDINGS: Stable support apparatus. Patchy asymmetric airspace disease versus edema persists with associated basilar atelectasis. Small effusions not excluded. No pneumothorax. No significant interval change.  IMPRESSION: Stable asymmetric airspace disease versus edema and basilar atelectasis.   Electronically Signed   By: Daryll Brod M.D.   On: 04/23/2013 07:31   Dg Chest Port 1 View  04/22/2013   CLINICAL DATA:  Followup the infiltrates and chest drain.  EXAM: PORTABLE CHEST - 1 VIEW  COMPARISON:  04/21/2013 and multiple previous.  FINDINGS: Tracheostomy, nasogastric tube, left arm PICC and left chest drain appear unchanged. PICC tip is difficult to clearly identify because of overlapping densities related to pacemaker. No pneumothorax or hemothorax. Bilateral pulmonary infiltrates persist, possibly with slightly more atelectasis on the left in general and in the right upper lobe.  IMPRESSION: Lines and tubes appear unchanged. No pneumothorax. Diffuse pulmonary density with slight worsening volume loss in the left lung and right upper lobe.   Electronically Signed   By: Nelson Chimes M.D.   On: 04/22/2013 07:23   Dg Abd Portable 1v  04/22/2013   CLINICAL DATA:  Nasogastric tube placement  EXAM: PORTABLE ABDOMEN - 1 VIEW  COMPARISON:  04/20/2013  FINDINGS: Nasogastric tube remains in place with tip in the gastric antrum near the pylorus. No evidence of dilated bowel loops.  IMPRESSION: Nasogastric tube tip in the distal stomach.   Electronically Signed   By: Earle Gell M.D.   On: 04/22/2013 21:49   Recent Results (from the past 240 hour(s))  OVA AND PARASITE EXAMINATION      Status: None   Collection Time    04/17/13  6:00 AM      Result Value Ref Range Status   Specimen Description STOOL   Final   Special Requests NONE   Final   Ova and parasites     Final   Value: NO OVA OR PARASITES SEEN     Performed at [facility]   Report Status 04/19/2013 FINAL   Final  CLOSTRIDIUM DIFFICILE BY PCR     Status: None   Collection Time    04/18/13  9:10 AM      Result Value Ref Range Status   C difficile by pcr NEGATIVE  NEGATIVE Final   Comment: Performed at [facility]      04/23/2013, 3:38 PM     LOS: 1 day

## 2013-04-24 DIAGNOSIS — A0811 Acute gastroenteropathy due to Norwalk agent: Secondary | ICD-10-CM

## 2013-04-24 LAB — BASIC METABOLIC PANEL
BUN: 41 mg/dL — ABNORMAL HIGH (ref 6–23)
CO2: 32 mEq/L (ref 19–32)
Calcium: 7.9 mg/dL — ABNORMAL LOW (ref 8.4–10.5)
Chloride: 103 mEq/L (ref 96–112)
Creatinine, Ser: 1.19 mg/dL — ABNORMAL HIGH (ref 0.50–1.10)
GFR calc Af Amer: 49 mL/min — ABNORMAL LOW (ref >90)
GFR calc non Af Amer: 43 mL/min — ABNORMAL LOW (ref >90)
GLUCOSE: 262 mg/dL — AB (ref 70–99)
Potassium: 4.2 mEq/L (ref 3.7–5.3)
Sodium: 144 mEq/L (ref 137–147)

## 2013-04-24 LAB — VANCOMYCIN, TROUGH: Vancomycin Tr: 18 ug/mL (ref 10.0–20.0)

## 2013-04-24 NOTE — Progress Notes (Signed)
INFECTIOUS DISEASE PROGRESS NOTE  ID: [name] is a 80 y.o. female with  Active Problems:   * No active hospital problems. *  Subjective: No complaints  Abtx:  Anti-infectives   None      Medications: I have reviewed the patient's current medications.  Objective: Vital signs in last 24 hours:  97.7        131/36 57 97%   General appearance: alert, cooperative and no distress Resp: clear to auscultation bilaterally Cardio: regular rate and rhythm GI: normal findings: bowel sounds normal and soft, non-tender Extremities: anasarca  Lab Results  Recent Labs  04/22/13 0415 04/23/13 0540 04/24/13 0755  WBC 22.7* 20.4*  --   HGB 9.1* 8.4*  --   HCT 28.7* 26.8*  --   NA 143 148* 144  K 3.1* 3.8 4.2  CL 104 107 103  CO2 29 28 32  BUN 40* 38* 41*  CREATININE 1.31* 1.28* 1.19*   Liver Panel No results found for this basename: PROT, ALBUMIN, AST, ALT, ALKPHOS, BILITOT, BILIDIR, IBILI,  in the last 72 hours Sedimentation Rate No results found for this basename: ESRSEDRATE,  in the last 72 hours C-Reactive Protein No results found for this basename: CRP,  in the last 72 hours  Microbiology: Recent Results (from the past 240 hour(s))  OVA AND PARASITE EXAMINATION     Status: None   Collection Time    04/17/13  6:00 AM      Result Value Ref Range Status   Specimen Description STOOL   Final   Special Requests NONE   Final   Ova and parasites     Final   Value: NO OVA OR PARASITES SEEN     Performed at [facility]   Report Status 04/19/2013 FINAL   Final  CLOSTRIDIUM DIFFICILE BY PCR     Status: None   Collection Time    04/18/13  9:10 AM      Result Value Ref Range Status   C difficile by pcr NEGATIVE  NEGATIVE Final   Comment: Performed at [facility]    Studies/Results: Ir Pta Venous Left  04/22/2013   CLINICAL DATA:  Respiratory distress. Poor venous access. Bedside PICC could not be advanced beyond the axillary vein. Left  subclavian pacemaker  EXAM: PICC PLACEMENT WITH  FLUOROSCOPY  VENOUS ANGIOPLASTY  TECHNIQUE: After written informed consent was obtained from the spouse, patient was placed in the supine position on angiographic table. The previously placed left upper arm midline catheter and surrounding skin were prepped using maximum barrier technique including cap and mask, sterile gown, sterile gloves, large sterile sheet, and Chlorhexidine as cutaneous antisepsis. The region was infiltrated locally with 1% lidocaine. The midline catheter was cut and a 018 guidewire was advanced. There was initially some resistance at the level of the central left subclavian vein, but ultimately the wire advanced into the SVC demonstrating patency of the central veins. The catheter was removed and a peel-away sheath placed. However, the PICC line would not advance beyond the central left subclavian vein. For this reason, exchanges allowed advancement of a 6 [place] vascular sheath which passed easily through the innominate vein. However, the PICC still would not advance completely into the SVC. For this reason, exchanges performed to allow placement of a 4 mm x 4 cm Mustang angioplasty balloon, use to dilate the central left subclavian and innominate veins. Ultimately, this allowed advancement of a 5-French double-lumen power injectable PICC trimmed to  43 cm, positioned with its tip near the cavoatrial junction. Both lumens flushed and aspirated easily. Spot chest radiograph confirms appropriate catheter position. Catheter was flushed per protocol and secured externally with 0-Prolene suture. The patient tolerated procedure well, with no immediate complication.  FLUOROSCOPY TIME:  4 min 6 seconds  IMPRESSION: Technically successful revision of left arm midline catheter to a 43 cm double lumen power injectable PICC, tip at cavoatrial junction, requiring 4 mm PTA of left subclavian and innominate stenoses. Recommend right arm approach for future  PICC lines.   Electronically Signed   By: Oley Balm M.D.   On: 04/22/2013 11:22   Ir Fluoro Guide Cv Line Left  04/22/2013   CLINICAL DATA:  Respiratory distress. Poor venous access. Bedside PICC could not be advanced beyond the axillary vein. Left subclavian pacemaker  EXAM: PICC PLACEMENT WITH  FLUOROSCOPY  VENOUS ANGIOPLASTY  TECHNIQUE: After written informed consent was obtained from the spouse, patient was placed in the supine position on angiographic table. The previously placed left upper arm midline catheter and surrounding skin were prepped using maximum barrier technique including cap and mask, sterile gown, sterile gloves, large sterile sheet, and Chlorhexidine as cutaneous antisepsis. The region was infiltrated locally with 1% lidocaine. The midline catheter was cut and a 018 guidewire was advanced. There was initially some resistance at the level of the central left subclavian vein, but ultimately the wire advanced into the SVC demonstrating patency of the central veins. The catheter was removed and a peel-away sheath placed. However, the PICC line would not advance beyond the central left subclavian vein. For this reason, exchanges allowed advancement of a 6 [place] vascular sheath which passed easily through the innominate vein. However, the PICC still would not advance completely into the SVC. For this reason, exchanges performed to allow placement of a 4 mm x 4 cm Mustang angioplasty balloon, use to dilate the central left subclavian and innominate veins. Ultimately, this allowed advancement of a 5-French double-lumen power injectable PICC trimmed to 43 cm, positioned with its tip near the cavoatrial junction. Both lumens flushed and aspirated easily. Spot chest radiograph confirms appropriate catheter position. Catheter was flushed per protocol and secured externally with 0-Prolene suture. The patient tolerated procedure well, with no immediate complication.  FLUOROSCOPY TIME:  4 min 6  seconds  IMPRESSION: Technically successful revision of left arm midline catheter to a 43 cm double lumen power injectable PICC, tip at cavoatrial junction, requiring 4 mm PTA of left subclavian and innominate stenoses. Recommend right arm approach for future PICC lines.   Electronically Signed   By: Oley Balm M.D.   On: 04/22/2013 11:22   Dg Chest Port 1 View  04/23/2013   CLINICAL DATA:  Respiratory failure  EXAM: PORTABLE CHEST - 1 VIEW  COMPARISON:  04/22/2013  FINDINGS: Stable support apparatus. Patchy asymmetric airspace disease versus edema persists with associated basilar atelectasis. Small effusions not excluded. No pneumothorax. No significant interval change.  IMPRESSION: Stable asymmetric airspace disease versus edema and basilar atelectasis.   Electronically Signed   By: Ruel Favors M.D.   On: 04/23/2013 07:31   Dg Abd Portable 1v  04/22/2013   CLINICAL DATA:  Nasogastric tube placement  EXAM: PORTABLE ABDOMEN - 1 VIEW  COMPARISON:  04/20/2013  FINDINGS: Nasogastric tube remains in place with tip in the gastric antrum near the pylorus. No evidence of dilated bowel loops.  IMPRESSION: Nasogastric tube tip in the distal stomach.   Electronically Signed   By:  Myles RosenthalJohn  Stahl M.D.   On: 04/22/2013 21:49     Assessment/Plan: Fever Norovirus VDRF MI   Total days of antibiotics  Total days of antibiotics:  Rocephin 2/3>>>2/9  Azithro 2/3>>>2/9  Tamiflu 2/3>>>2/4  ............  Clinda 2/12>>>2/15  Vanco 2/12>>>2/15  Zosyn 2/12>>>  Diflucan 2/12 (yeas in urine) >>>2/14  Vanc 2/17>>>     Will stop her vanco.  Could consider repeat TTE if more fevers (post MI pericarditis), as well as detailed plans in note 04-23-13        Johny SaxJeffrey Malaika Arnall Infectious Diseases (pager) [phone] [email or web address] 04/24/2013, 9:12 AM  LOS: 2 days

## 2013-04-25 DIAGNOSIS — J9620 Acute and chronic respiratory failure, unspecified whether with hypoxia or hypercapnia: Secondary | ICD-10-CM

## 2013-04-25 DIAGNOSIS — R509 Fever, unspecified: Secondary | ICD-10-CM

## 2013-04-25 NOTE — Consult Note (Signed)
Name: [name] MRN: [id number] DOB: 04/02/1932    ADMISSION DATE:  04/22/2013 CONSULTATION DATE:  04/25/13  [facility] MD :  [facility]IMARY SERVICE:  [facility]  CHIEF COMPLAINT:  Acute respiratory failure, vent mgmt   BRIEF PATIENT DESCRIPTION:  80 y/o F admitted 2/2 with norovirus gastroenteritis,respiratory failure, hypovolemic and presumed septic shock -source unclear. Course complicated by ARDS, AKI. 2/8 am suffered mucus plugging with ETT obstruction with subsequent cardiac arrest. Required CPR x 9 mins, improved with re-intubation & left pneumothorax decompression.  Failed extubation on 2/18 due to severe deconditioning & hypoxia & required tstomy. Unfortunately, she has had ongoing fevers that have not had clear defervescence. Cultures & imaging are negative for overt ongoing infectious process. Infectious Disease MD consulted for further evaluation of fevers.  [facility] and rehab.   SIGNIFICANT EVENTS / STUDIES:  2/03 - Admit with fever, SOB, vomiting, diarrhea (concern for food poisoning) & CXR concerning for interstitial pneumonitis  2/03 - 2 d echo>>>Ef 45% grade 1 diastolic dysfunction  2/08 - resp arrest with mucus plugging, ETT exchanged  2/09 - changed to PCV overnight  2/10 - febrile overnight, hypotension requiring pressors  2/11 - CT Chest/ABD/Pelvis>>ARDS, R pleural effusion, L CT, Air in sq of anterior abd with marked inflammation  2/11 - CT Head>>neg  2/14 - fever curve down but wbc up 33K . Still +4L in 7 days but even balance x 3 dauys. Resp secretions improved but cxr stil looks bad  2/16 - extubated, required bipap post extubation  2/17 duplex neg BLE  2/20 - to IR for PICC line, more alert  2/20 - [facility]   LINES / TUBES:  L IJ TLC 2/3>>>2/17  OTT2/4>>>2/8, 2/8 (mucus plug)>>>2/16, 2/18 >> 2/18  Trach 2/18 >>  A line 2/4>>2/14  OGT 2/4>>2/16  LUA PICC 2/19>>>   CULTURES:  MRSA PCR 2/3>>> negative  BCx2  2/3>>>neg  Sputum 2/3>>> normal flora  UA 2/3>>> negative nit and LE  Resp Viral Panel 2/3>>>negative  2/4 tracheal aspirate>>normal flora  Stool culture, o&p 2/15>>>neg  CDiff PCR 04/11/13>>neg  ..................  BCx2 2/11>>>ng  Sputum 2/11>>>few candidate  UA 2/11>>>many yeast  UC 2/11>>>greater 100k yeast  ...................  Stool O&P 2/17>>>neg   ANTIBIOTICS:  Rocephin 2/3>>>2/9  Azithro 2/3>>>2/9  Tamiflu 2/3>>>2/4  ............  Clinda 2/12>>>2/15  Vanco 2/12>>>2/15  Zosyn 2/12>>>  Diflucan 2/12 (yeas in urine) >>>2/14  Vanc 2/17>>>   HISTORY OF PRESENT ILLNESS:  80 y/o F admitted 2/2 with norovirus gastroenteritis,respiratory failure, hypovolemic and presumed septic shock -source unclear. Course complicated by ARDS. 2/8 am suffered mucus plugging with ETT obstruction with subsequent cardiac arrest. Required CPR x 9 mins, improved with re-intubation & left pneumothorax decompression.  Failed extubation on 2/18 due to severe deconditioning & hypoxia & required tstomy. [facility] and rehab.  Not weaning this am, c/o SOB and overall discomfort.     PAST MEDICAL HISTORY :  Past Medical History  Diagnosis Date  . Hypothyroid   . Complete heart block 2002    Initial PPM 2002 with upgrade by JA to CRT-P 11/21/08  . Nonischemic cardiomyopathy   . Persistent atrial fibrillation   . Hypertension   . Hyperlipidemia   . Hives 4/10  . Cataract 4/13    right  . Renal failure     3rd stage-seeing neurologist   Past Surgical History  Procedure Laterality Date  .  Pacemaker insertion  2002, 2010    PPM 2002, upgrade to CRT-P by Dr Johney Frame (MDT 2010)  . Partial colectomy  1/04  . Abdominal hysterectomy  1987    TAH  . Bunionectomy Right 1996  . Breast biopsy Left 01/1997  . Cataract extraction  4/13    both eyes  . Cyst excision      on back of left ear   Prior to Admission medications   Medication Sig Start Date End Date Taking?  Authorizing Provider  acetaminophen (TYLENOL) 160 MG/5ML solution Place 20.3 mLs (650 mg total) into feeding tube every 6 (six) hours as needed for fever. 04/22/13   Jeanella Craze, NP  acetylcysteine (MUCOMYST) 20 % nebulizer solution Take 2 mLs by nebulization 2 (two) times daily. 04/22/13   Jeanella Craze, NP  Amino Acids-Protein Hydrolys (FEEDING SUPPLEMENT, PRO-STAT SUGAR FREE 64,) LIQD Place 30 mLs into feeding tube 3 (three) times daily. 04/22/13   Jeanella Craze, NP  aspirin 325 MG tablet Take 325 mg by mouth daily.      Historical Provider, MD  chlorhexidine (PERIDEX) 0.12 % solution 15 mLs by Mouth Rinse route 2 (two) times daily. 04/22/13   Jeanella Craze, NP  dexmedetomidine (PRECEDEX) 200 MCG/50ML SOLN Inject 34.92-104.76 mcg/hr into the vein continuous. 04/22/13   Jeanella Craze, NP  fentaNYL (SUBLIMAZE) 0.05 MG/ML injection Inject 1-2 mLs (50-100 mcg total) into the vein every 2 (two) hours as needed for severe pain (or RASS > 0). 04/22/13   Jeanella Craze, NP  folic acid (FOLVITE) 1 MG tablet Take 1 mg by mouth daily.      Historical Provider, MD  heparin 5000 UNIT/ML injection Inject 1 mL (5,000 Units total) into the skin every 8 (eight) hours. 04/22/13   Jeanella Craze, NP  insulin aspart (NOVOLOG) 100 UNIT/ML injection Inject 0-15 Units into the skin every 4 (four) hours. 04/22/13   Jeanella Craze, NP  ipratropium-albuterol (DUONEB) 0.5-2.5 (3) MG/3ML SOLN Take 3 mLs by nebulization every 6 (six) hours. 04/22/13   Jeanella Craze, NP  levothyroxine (SYNTHROID, LEVOTHROID) 75 MCG tablet Take 75 mcg by mouth daily after breakfast.    Historical Provider, MD  methylPREDNISolone sodium succinate (SOLU-MEDROL) 40 mg/mL injection Inject 1 mL (40 mg total) into the vein daily. 04/22/13   Jeanella Craze, NP  Multiple Vitamin (MULTIVITAMIN) LIQD Place 5 mLs into feeding tube daily. 04/22/13   Jeanella Craze, NP  Nutritional Supplements (FEEDING SUPPLEMENT, OXEPA,) LIQD Place 1,000 mLs into feeding tube  daily. Rate of 40 ml/hr 04/22/13   Jeanella Craze, NP  pantoprazole sodium (PROTONIX) 40 mg/20 mL PACK Place 20 mLs (40 mg total) into feeding tube daily. 04/22/13   Jeanella Craze, NP  piperacillin-tazobactam (ZOSYN) 3.375 GM/50ML IVPB Inject 50 mLs (3.375 g total) into the vein every 8 (eight) hours. 04/22/13   Jeanella Craze, NP  sodium chloride 0.9 % SOLN 200 mL with fentaNYL 0.05 MG/ML SOLN 2,500 mcg Titrate up to 200 mcg/hr for comfort. 04/22/13   Jeanella Craze, NP  sodium chloride 0.9 % SOLN 250 mL with vancomycin 10 G SOLR 1250mg  Q24 hours. To be dosed per pharmacy 04/22/13   Jeanella Craze, NP  Water For Irrigation, Sterile (FREE WATER) SOLN Place 350 mLs into feeding tube every 6 (six) hours. 04/22/13   Jeanella Craze, NP   Allergies  Allergen Reactions  . Beta Adrenergic Blockers Other (See Comments)  Not working well for pt.  Sherrie Mustache Hcl] Shortness Of Breath  . Carvedilol Shortness Of Breath    Chest  Discomfort.  . Dapsone Other (See Comments)    Kidney  failure  . Diclofenac Sodium Other (See Comments)    Causes  Fluid  Retention.  Consuelo Pandy [Pitavastatin Calcium] Shortness Of Breath  . Methotrexate Shortness Of Breath    Mouth sores.  . Metoprolol Succinate Hives  . Oxaprozin Diarrhea  . Other     Fragrance mixtures -  Cinol botanicals  &  Plants oil. Cocamidoproy   -   Betaine. Lipstick & other makeup & cleaning materials.   Marland Kitchen Epinephrine     FAMILY HISTORY:  Family History  Problem Relation Age of Onset  . Diabetes Maternal Aunt   . Breast cancer Maternal Aunt   . Stroke Mother   . Stroke Father   . Hypothyroidism Sister   . Hypothyroidism Son   . Hypothyroidism Mother   . Hypertension Father   . Kidney disease Father     renal problems   SOCIAL HISTORY:  reports that she has never smoked. She has never used smokeless tobacco. She reports that she does not drink alcohol or use illicit drugs.  REVIEW OF SYSTEMS:   As per HPI - All other  systems reviewed and were neg.   SUBJECTIVE:   VITAL SIGNS:    PHYSICAL EXAMINATION: General:  Chronically ill appearing female, debilitated, NAD Neuro:  Awake, nods appropriately, follows commands HEENT:  Mm dry, no JVD, trach c/d Cardiovascular:  s1s2 rrr Lungs:  resps even non labored on full support, diminished bases, few scattered rhonchi  Abdomen:  Soft, +bs Musculoskeletal:  Warm and dry, generalized edema improved, L>R BUE edema   PULMONARY  Recent Labs Lab 04/20/13 1247  PHART 7.565*  PCO2ART 34.4*  PO2ART 68.4*  HCO3 31.2*  TCO2 28.0  O2SAT 93.7    CBC  Recent Labs Lab 04/20/13 1159 04/22/13 0415 04/22/13 1029 04/23/13 0540  HGB 10.5* 9.1*  --  8.4*  HCT 33.2* 28.7*  --  26.8*  WBC 20.9* 22.7*  --  20.4*  PLT PLATELET CLUMPS NOTED ON SMEAR, COUNT APPEARS ADEQUATE PLATELET CLUMPS NOTED ON SMEAR, UNABLE TO ESTIMATE PLATELET CLUMPS NOTED ON SMEAR, COUNT APPEARS ADEQUATE PLATELET CLUMPS NOTED ON SMEAR, COUNT APPEARS ADEQUATE    COAGULATION  Recent Labs Lab 04/20/13 1400  INR 1.30    CARDIAC  No results found for this basename: TROPONINI,  in the last 168 hours No results found for this basename: PROBNP,  in the last 168 hours   CHEMISTRY  Recent Labs Lab 04/19/13 0752 04/20/13 1159 04/20/13 2130 04/21/13 0545 04/22/13 0415 04/23/13 0540 04/24/13 0755  NA 155* 156* 150* 149* 143 148* 144  K 3.3* 2.9* 3.1* 3.4* 3.1* 3.8 4.2  CL 112 112 108 107 104 107 103  CO2 31 30 30 30 29 28  32  GLUCOSE 191* 177* 234* 230* 226* 151* 262*  BUN 52* 43* 41* 39* 40* 38* 41*  CREATININE 1.45* 1.36* 1.34* 1.22* 1.31* 1.28* 1.19*  CALCIUM 8.0* 7.3* 7.3* 7.5* 7.3* 7.7* 7.9*  MG 2.6* 2.3  --  2.3  --   --   --   PHOS 4.0 4.2  --  3.1  --   --   --    The CrCl is unknown because both a height and weight (above a minimum accepted value) are required for this calculation.   LIVER  Recent  Labs Lab 04/20/13 1400  INR 1.30     INFECTIOUS  Recent  Labs Lab 04/21/13 2015 04/22/13 1130 04/23/13 0540  LATICACIDVEN 2.5*  --   --   PROCALCITON  --  0.64 0.62     ENDOCRINE CBG (last 3)  No results found for this basename: GLUCAP,  in the last 72 hours       IMAGING x48h  No results found.    No results found.  ASSESSMENT / PLAN:  Acute respiratory failure - B infiltrates > ddx = ARDS, viral pneumonitis, bacterial, consider amiodarone or other non-infectious inflammatory process. Note she has had a similar hospitalization 2-3 yrs ago (1/12). Consider COP, acute amiodarone rxn. Favor viral or non-infectious etiology.  ARDS  Mucous plugging - with resp arrest 2/8 L ptx s/p chest tube  PLAN -  -work towards ATC wean per protocol  -aggressive pt/ot -continue nebs  -keep dry as tol  -water seal chest tube, dc once tolerating ATC continuously  -continue solumedrol with slow wean  -titrate O2 for sats >88% -continue mucinex & mucomyst    Septic and Hypovolemic Shock - transiently required pressors.  Cardiac arrest 2/8 am due to ETT obstruction  Hx HTN  Hx CHF / ICM s/p Pacemaker - compensated. EF 55-60% 2/10  AFib on amiodarone for years  PLAN -  -no further amiodarone  -hold zocor  -continue TED hose  -PICC repositioned per IR  -recommend OT to wrap upper extremities for compression per protocol  -Could consider repeat TTE if more fevers (post MI pericarditis)    Gastroenteritis, Diarrhea. NOROVIRUS +  R/O CAP vs Viral Respiratory Illness Fever - noted off abx 2/10, leucocytosis ? Steroids ? Line sepsis  PLAN -  Per ID MD    Other problems per [facility] MD   Danford Bad, NP 04/25/2013  11:48 AM Pager: [phone] or [phone]    STAFF NOTE: I, Dr Lavinia Sharps have personally reviewed patient's available data, including medical history, events of note, physical examination and test results as part of my evaluation. I have discussed with resident/NP and other care providers such as  pharmacist, RN and RRT.  In addition,  I personally evaluated patient and elicited key findings of acute on chronic resp failure, ARDS with concern for amio tox. Very deconditioned. 2nd critical illness. PAtient known to me from 2 weekends ago at [facility]. Continue chronic critical illness and prolonged mech vent acute on chronic resp failure care.   Rest per NP/medical resident whose note is outlined above and that I agree with   Dr. Kalman Shan, M.D., [facility].C.P Pulmonary and Critical Care Medicine Staff Physician [facility] Pager: [phone], If no answer or between  15:00h - 7:00h: call [phone]  04/25/2013 12:05 PM

## 2013-04-26 DIAGNOSIS — N19 Unspecified kidney failure: Secondary | ICD-10-CM

## 2013-04-26 LAB — CBC
HCT: 29.8 % — ABNORMAL LOW (ref 36.0–46.0)
HEMOGLOBIN: 9.4 g/dL — AB (ref 12.0–15.0)
MCH: 29.9 pg (ref 26.0–34.0)
MCHC: 31.5 g/dL (ref 30.0–36.0)
MCV: 94.9 fL (ref 78.0–100.0)
PLATELETS: ADEQUATE 10*3/uL (ref 150–400)
RBC: 3.14 MIL/uL — AB (ref 3.87–5.11)
RDW: 15.7 % — ABNORMAL HIGH (ref 11.5–15.5)
WBC: 19.8 10*3/uL — AB (ref 4.0–10.5)

## 2013-04-26 LAB — BLOOD GAS, ARTERIAL
ACID-BASE EXCESS: 10.5 mmol/L — AB (ref 0.0–2.0)
BICARBONATE: 34.3 meq/L — AB (ref 20.0–24.0)
FIO2: 0.6 %
MECHVT: 500 mL
O2 SAT: 93.4 %
PATIENT TEMPERATURE: 98.6
PEEP: 8 cmH2O
RATE: 14 resp/min
TCO2: 35.6 mmol/L (ref 0–100)
pCO2 arterial: 43.3 mmHg (ref 35.0–45.0)
pH, Arterial: 7.51 — ABNORMAL HIGH (ref 7.350–7.450)
pO2, Arterial: 67.6 mmHg — ABNORMAL LOW (ref 80.0–100.0)

## 2013-04-26 LAB — BASIC METABOLIC PANEL
BUN: 43 mg/dL — AB (ref 6–23)
CHLORIDE: 101 meq/L (ref 96–112)
CO2: 32 mEq/L (ref 19–32)
Calcium: 8.4 mg/dL (ref 8.4–10.5)
Creatinine, Ser: 1.13 mg/dL — ABNORMAL HIGH (ref 0.50–1.10)
GFR calc Af Amer: 53 mL/min — ABNORMAL LOW (ref >90)
GFR, EST NON AFRICAN AMERICAN: 45 mL/min — AB (ref >90)
Glucose, Bld: 152 mg/dL — ABNORMAL HIGH (ref 70–99)
POTASSIUM: 3.9 meq/L (ref 3.7–5.3)
SODIUM: 146 meq/L (ref 137–147)

## 2013-04-26 LAB — C-REACTIVE PROTEIN: CRP: 10.7 mg/dL — ABNORMAL HIGH (ref <0.60)

## 2013-04-26 LAB — TSH: TSH: 3.239 u[IU]/mL (ref 0.350–4.500)

## 2013-04-26 LAB — SEDIMENTATION RATE: Sed Rate: 131 mm/hr — ABNORMAL HIGH (ref 0–22)

## 2013-04-26 NOTE — Progress Notes (Signed)
Left upper extremity venous duplex:  No evidence of DVT or superficial thrombosis.    

## 2013-04-27 DIAGNOSIS — Z930 Tracheostomy status: Secondary | ICD-10-CM

## 2013-04-27 DIAGNOSIS — J9600 Acute respiratory failure, unspecified whether with hypoxia or hypercapnia: Secondary | ICD-10-CM

## 2013-04-27 LAB — PROTIME-INR
INR: 1.06 (ref 0.00–1.49)
Prothrombin Time: 13.6 seconds (ref 11.6–15.2)

## 2013-04-27 NOTE — Progress Notes (Signed)
Name: [name] MRN: [id number] DOB: Sep 16, 1932    ADMISSION DATE:  04/22/2013 CONSULTATION DATE:  04/25/13  REF[facility]ING MD :  [facility] SERVICE:  [facility]  CHIEF COMPLAINT:  Acute respiratory failure, vent mgmt   BRIEF PATIENT DESCRIPTION:  80 y/o F admitted 2/2 with norovirus gastroenteritis,respiratory failure, hypovolemic and presumed septic shock -source unclear. Course complicated by ARDS, AKI. 2/8 am suffered mucus plugging with ETT obstruction with subsequent cardiac arrest. Required CPR x 9 mins, improved with re-intubation & left pneumothorax decompression. Failed extubation on 2/18 due to severe deconditioning & hypoxia & required tstomy. Unfortunately, she has had ongoing fevers that have not had clear defervescence. Cultures & imaging are negative for overt ongoing infectious process. Infectious Disease MD consulted for further evaluation of fevers.  [facility] and rehab.   SIGNIFICANT EVENTS / STUDIES:  2/03 - Admit with fever, SOB, vomiting, diarrhea (concern for food poisoning) & CXR concerning for interstitial pneumonitis  2/03 - 2 d echo>>>Ef 45% grade 1 diastolic dysfunction  2/08 - resp arrest with mucus plugging, ETT exchanged  2/09 - changed to PCV overnight  2/10 - febrile overnight, hypotension requiring pressors  2/11 - CT Chest/ABD/Pelvis>>ARDS, R pleural effusion, L CT, Air in sq of anterior abd with marked inflammation  2/11 - CT Head>>neg  2/14 - fever curve down but wbc up 33K . Still +4L in 7 days but even balance x 3 dauys. Resp secretions improved but cxr stil looks bad  2/16 - extubated, required bipap post extubation  2/17 duplex neg BLE  2/20 - to IR for PICC line, more alert  2/20 - d[facility] to [facility]  ............................................................................................................................................................................................................. 2/25 -  fatigued after sitting up in chair on 2/24 for 3 hours, fiO2 to 45 %, Peep 8  LINES / TUBES:  L IJ TLC 2/3>>>2/17  OTT2/4>>>2/8, 2/8 (mucus plug)>>>2/16, 2/18 >> 2/18  Trach 2/18 >>  A line 2/4>>2/14  OGT 2/4>>2/16  LUA PICC 2/19>>>   CULTURES:  MRSA PCR 2/3>>> negative  BCx2 2/3>>>neg  Sputum 2/3>>> normal flora  UA 2/3>>> negative nit and LE  Resp Viral Panel 2/3>>>negative  2/4 tracheal aspirate>>normal flora  Stool culture, o&p 2/15>>>neg  CDiff PCR 04/11/13>>neg  ..................  BCx2 2/11>>>ng  Sputum 2/11>>>few candidate  UA 2/11>>>many yeast  UC 2/11>>>greater 100k yeast  ...................  Stool O&P 2/17>>>neg   ANTIBIOTICS:  Rocephin 2/3>>>2/9  Azithro 2/3>>>2/9  Tamiflu 2/3>>>2/4  ............  Clinda 2/12>>>2/15  Vanco 2/12>>>2/15  Zosyn 2/12>>>  Diflucan 2/12 (yeas in urine) >>>2/14  Vanc 2/17>>>     SUBJECTIVE: Patient reports fatigue, wants to eat.  Sat up in chair for 3 hours yesterday  VITAL SIGNS:  Reviewed at bedside.    PHYSICAL EXAMINATION: General:  Chronically ill appearing female, debilitated, NAD Neuro:  Awake, nods appropriately, follows commands HEENT:  Mm dry, no JVD, trach c/d Cardiovascular:  s1s2 rrr Lungs:  resps even non labored on full support, diminished bases, few scattered rhonchi  Abdomen:  Soft, +bs Musculoskeletal:  Warm and dry, generalized edema improved, L>R BUE edema   PULMONARY  Recent Labs Lab 04/20/13 1247 04/26/13 0513  PHART 7.565* 7.510*  PCO2ART 34.4* 43.3  PO2ART 68.4* 67.6*  HCO3 31.2* 34.3*  TCO2 28.0 35.6  O2SAT 93.7 93.4   CBC  Recent Labs Lab 04/22/13 0415 04/22/13 1029 04/23/13 0540 04/26/13 0500  HGB 9.1*  --  8.4* 9.4*  HCT 28.7*  --  26.8* 29.8*  WBC 22.7*  --  20.4* 19.8*  PLT PLATELET CLUMPS NOTED ON SMEAR, UNABLE TO ESTIMATE PLATELET CLUMPS NOTED ON SMEAR, COUNT APPEARS ADEQUATE PLATELET CLUMPS NOTED ON SMEAR, COUNT APPEARS ADEQUATE PLATELET CLUMPS NOTED ON SMEAR, COUNT  APPEARS ADEQUATE   COAGULATION  Recent Labs Lab 04/20/13 1400 04/27/13 0627  INR 1.30 1.06   CHEMISTRY  Recent Labs Lab 04/20/13 1159  04/21/13 0545 04/22/13 0415 04/23/13 0540 04/24/13 0755 04/26/13 0500  NA 156*  < > 149* 143 148* 144 146  K 2.9*  < > 3.4* 3.1* 3.8 4.2 3.9  CL 112  < > 107 104 107 103 101  CO2 30  < > 30 29 28  32 32  GLUCOSE 177*  < > 230* 226* 151* 262* 152*  BUN 43*  < > 39* 40* 38* 41* 43*  CREATININE 1.36*  < > 1.22* 1.31* 1.28* 1.19* 1.13*  CALCIUM 7.3*  < > 7.5* 7.3* 7.7* 7.9* 8.4  MG 2.3  --  2.3  --   --   --   --   PHOS 4.2  --  3.1  --   --   --   --   < > = values in this interval not displayed. The CrCl is unknown because both a height and weight (above a minimum accepted value) are required for this calculation.  IMAGING x48h  No results found.    No results found.  ASSESSMENT / PLAN:  Acute respiratory failure - B infiltrates > ddx = ARDS, viral pneumonitis, bacterial, consider amiodarone or other non-infectious inflammatory process. Note she has had a similar hospitalization 2-3 yrs ago (1/12). Consider COP, acute amiodarone rxn. Favor viral or non-infectious etiology.  ARDS  Mucous plugging - with resp arrest 2/8 L ptx s/p chest tube   PLAN -  -work towards ATC wean per protocol  -aggressive pt/ot -continue nebs  -keep dry as tol  -water seal chest tube, dc once tolerating ATC continuously  -continue solumedrol with slow wean  -titrate O2 for sats >88% -continue mucinex & mucomyst    Septic and Hypovolemic Shock - transiently required pressors.  Cardiac arrest 2/8 am due to ETT obstruction  Hx HTN  Hx CHF / ICM s/p Pacemaker - compensated. EF 55-60% 2/10  AFib on amiodarone for years   PLAN -  -no further amiodarone  -hold zocor  -continue TED hose  -recommend OT to wrap upper extremities for compression per protocol  -Could consider repeat TTE if more fevers (post MI pericarditis)   Gastroenteritis, Diarrhea.  NOROVIRUS +  R/O CAP vs Viral Respiratory Illness Fever - noted off abx 2/10, leucocytosis ? Steroids ? Line sepsis.  Resolved.   PLAN -  Per ID MD    Other problems per [facility]  Canary BrimBrandi Ollis, NP-C [facility] Pgr: [phone] or [phone]  Back to full vent support after decompensation earlier today.  Fluid overloaded, recommend diureses and attempt PS trials again when able.  May need echo as above.  PCCM will continue to follow.  Patient seen and examined, agree with above note.  I dictated the care and orders written for this patient under my direction.  Alyson ReedyWesam G Romilda Proby, MD [phone]

## 2013-04-28 LAB — BLOOD GAS, ARTERIAL
ACID-BASE EXCESS: 6.9 mmol/L — AB (ref 0.0–2.0)
Acid-Base Excess: 6 mmol/L — ABNORMAL HIGH (ref 0.0–2.0)
BICARBONATE: 29.4 meq/L — AB (ref 20.0–24.0)
Bicarbonate: 30.4 mEq/L — ABNORMAL HIGH (ref 20.0–24.0)
FIO2: 50 %
FIO2: 0.4 %
LHR: 14 {breaths}/min
MECHVT: 500 mL
MODE: POSITIVE
O2 SAT: 99.5 %
O2 Saturation: 95.6 %
PATIENT TEMPERATURE: 98.6
PEEP: 8 cmH2O
PEEP: 5 cmH2O
PH ART: 7.503 — AB (ref 7.350–7.450)
PRESSURE SUPPORT: 12 cmH2O
Patient temperature: 98.8
TCO2: 31.6 mmol/L (ref 0–100)
TCO2: 30.5 mmol/L (ref 0–100)
pCO2 arterial: 40.1 mmHg (ref 35.0–45.0)
pCO2 arterial: 37.7 mmHg (ref 35.0–45.0)
pH, Arterial: 7.493 — ABNORMAL HIGH (ref 7.350–7.450)
pO2, Arterial: 206 mmHg — ABNORMAL HIGH (ref 80.0–100.0)
pO2, Arterial: 75 mmHg — ABNORMAL LOW (ref 80.0–100.0)

## 2013-04-28 LAB — BASIC METABOLIC PANEL
BUN: 37 mg/dL — ABNORMAL HIGH (ref 6–23)
CO2: 28 mEq/L (ref 19–32)
Calcium: 8.4 mg/dL (ref 8.4–10.5)
Chloride: 103 mEq/L (ref 96–112)
Creatinine, Ser: 0.91 mg/dL (ref 0.50–1.10)
GFR, EST AFRICAN AMERICAN: 68 mL/min — AB (ref >90)
GFR, EST NON AFRICAN AMERICAN: 59 mL/min — AB (ref >90)
Glucose, Bld: 157 mg/dL — ABNORMAL HIGH (ref 70–99)
POTASSIUM: 3.8 meq/L (ref 3.7–5.3)
SODIUM: 143 meq/L (ref 137–147)

## 2013-04-28 LAB — CBC
HCT: 29.5 % — ABNORMAL LOW (ref 36.0–46.0)
HEMOGLOBIN: 9.2 g/dL — AB (ref 12.0–15.0)
MCH: 29.8 pg (ref 26.0–34.0)
MCHC: 31.2 g/dL (ref 30.0–36.0)
MCV: 95.5 fL (ref 78.0–100.0)
RBC: 3.09 MIL/uL — ABNORMAL LOW (ref 3.87–5.11)
RDW: 16.3 % — ABNORMAL HIGH (ref 11.5–15.5)
WBC: 22.3 10*3/uL — AB (ref 4.0–10.5)

## 2013-04-28 LAB — PROTIME-INR
INR: 1.15 (ref 0.00–1.49)
PROTHROMBIN TIME: 14.5 s (ref 11.6–15.2)

## 2013-04-29 DIAGNOSIS — A419 Sepsis, unspecified organism: Secondary | ICD-10-CM

## 2013-04-29 LAB — PROTIME-INR
INR: 1.22 (ref 0.00–1.49)
Prothrombin Time: 15.1 seconds (ref 11.6–15.2)

## 2013-04-29 NOTE — Progress Notes (Signed)
Name: [name] MRN: [id number] DOB: 04/11/1932    ADMISSION DATE:  04/22/2013 CONSULTATION DATE:  04/25/13  [facility] MD :  [facility]:  [facility]  CHIEF COMPLAINT:  Acute respiratory failure, vent mgmt   BRIEF PATIENT DESCRIPTION:  80 y/o F admitted 2/2 with norovirus gastroenteritis,respiratory failure, hypovolemic and presumed septic shock -source unclear. Course complicated by ARDS, AKI. 2/8 am suffered mucus plugging with ETT obstruction with subsequent cardiac arrest. Required CPR x 9 mins, improved with re-intubation & left pneumothorax decompression. Failed extubation on 2/18 due to severe deconditioning & hypoxia & required tstomy. Unfortunately, she has had ongoing fevers that have not had clear defervescence. Cultures & imaging are negative for overt ongoing infectious process. Infectious Disease MD consulted for further evaluation of fevers.  [facility] and rehab.   SIGNIFICANT EVENTS / STUDIES:  2/03 - Admit with fever, SOB, vomiting, diarrhea (concern for food poisoning) & CXR concerning for interstitial pneumonitis  2/03 - 2 d echo>>>Ef 45% grade 1 diastolic dysfunction  2/08 - resp arrest with mucus plugging, ETT exchanged  2/09 - changed to PCV overnight  2/10 - febrile overnight, hypotension requiring pressors  2/11 - CT Chest/ABD/Pelvis>>ARDS, R pleural effusion, L CT, Air in sq of anterior abd with marked inflammation  2/11 - CT Head>>neg  2/14 - fever curve down but wbc up 33K . Still +4L in 7 days but even balance x 3 dauys. Resp secretions improved but cxr stil looks bad  2/16 - extubated, required bipap post extubation  2/17 duplex neg BLE  2/20 - to IR for PICC line, more alert  2/20 - [facility]  ............................................................................................................................................................................................................. 2/25 -  fatigued after sitting up in chair on 2/24 for 3 hours, fiO2 to 45 %, Peep 8 2/27 - mild sedation after klonopin, weaning on PSV 14/5, 40% fiO2  LINES / TUBES:  L IJ TLC 2/3>>>2/17  OTT2/4>>>2/8, 2/8 (mucus plug)>>>2/16, 2/18 >> 2/18  Trach 2/18 >>  A line 2/4>>2/14  OGT 2/4>>2/16  LUA PICC 2/19>>>   CULTURES:  MRSA PCR 2/3>>> negative  BCx2 2/3>>>neg  Sputum 2/3>>> normal flora  UA 2/3>>> negative nit and LE  Resp Viral Panel 2/3>>>negative  2/4 tracheal aspirate>>normal flora  Stool culture, o&p 2/15>>>neg  CDiff PCR 04/11/13>>neg  ..................  BCx2 2/11>>>ng  Sputum 2/11>>>few candidate  UA 2/11>>>many yeast  UC 2/11>>>greater 100k yeast  ...................  Stool O&P 2/17>>>neg   ANTIBIOTICS:  Rocephin 2/3>>>2/9  Azithro 2/3>>>2/9  Tamiflu 2/3>>>2/4  ............  Clinda 2/12>>>2/15  Vanco 2/12>>>2/15  Zosyn 2/12>>>  Diflucan 2/12 (yeas in urine) >>>2/14  Vanc 2/17>>> ......................................Marland Kitchen. [facility]    SUBJECTIVE: Patient reports fatigue, weaned 10 hours on 2/26  VITAL SIGNS:  Reviewed at bedside.    PHYSICAL EXAMINATION: General:  Chronically ill appearing female, debilitated, NAD Neuro:  Awake, nods appropriately, follows commands HEENT:  Mm dry, no JVD, trach c/d Cardiovascular:  s1s2 rrr Lungs:  resps even non labored on full support, diminished bases, few scattered rhonchi  Abdomen:  Soft, +bs Musculoskeletal:  Warm and dry, generalized edema improved, L>R BUE edema   PULMONARY  Recent Labs Lab 04/26/13 0513 04/28/13 0500 04/28/13 1730  PHART 7.510* 7.493* 7.503*  PCO2ART 43.3 40.1 37.7  PO2ART 67.6* 206.0* 75.0*  HCO3 34.3* 30.4* 29.4*  TCO2 35.6 31.6 30.5  O2SAT 93.4 99.5 95.6   CBC  Recent Labs Lab 04/23/13 0540 04/26/13 0500 04/28/13 0305  HGB 8.4* 9.4*  9.2*  HCT 26.8* 29.8* 29.5*  WBC 20.4* 19.8* 22.3*  PLT PLATELET CLUMPS NOTED ON SMEAR, COUNT APPEARS ADEQUATE PLATELET CLUMPS NOTED ON SMEAR,  COUNT APPEARS ADEQUATE PLATELET CLUMPING, SUGGEST RECOLLECTION OF SAMPLE IN CITRATE TUBE.   COAGULATION  Recent Labs Lab 04/27/13 0627 04/28/13 0305 04/29/13 0500  INR 1.06 1.15 1.22   CHEMISTRY  Recent Labs Lab 04/23/13 0540 04/24/13 0755 04/26/13 0500 04/28/13 0305  NA 148* 144 146 143  K 3.8 4.2 3.9 3.8  CL 107 103 101 103  CO2 28 32 32 28  GLUCOSE 151* 262* 152* 157*  BUN 38* 41* 43* 37*  CREATININE 1.28* 1.19* 1.13* 0.91  CALCIUM 7.7* 7.9* 8.4 8.4   The CrCl is unknown because both a height and weight (above a minimum accepted value) are required for this calculation.  IMAGING x48h  No results found.    No results found.  ASSESSMENT / PLAN:  Acute respiratory failure - B infiltrates > ddx = ARDS, viral pneumonitis, bacterial, consider amiodarone or other non-infectious inflammatory process. Note she has had a similar hospitalization 2-3 yrs ago (1/12). Consider COP, acute amiodarone rxn. Favor viral or non-infectious etiology.  ARDS  Mucous plugging - with resp arrest 2/8 L ptx s/p chest tube   PLAN -  - Work towards ATC wean per protocol  - Aggressive pt/ot - Continue nebs  - Keep dry as tol  - Water seal chest tube, dc once tolerating ATC continuously  - Continue solumedrol with slow wean  - Titrate O2 for sats >88% - Continue mucinex & mucomyst   Septic and Hypovolemic Shock - transiently required pressors.  Cardiac arrest 2/8 am due to ETT obstruction  Hx HTN  Hx CHF / ICM s/p Pacemaker - compensated. EF 55-60% 2/10  AFib on amiodarone for years   PLAN -  - Diuresis as tolerated  - No further amiodarone  - Hold zocor  - continue TED hose  - Recommend OT to wrap upper extremities for compression per protocol  - Could consider repeat TTE if more fevers (post MI pericarditis)   Gastroenteritis, Diarrhea. NOROVIRUS +  R/O CAP vs Viral Respiratory Illness Fever - noted off abx 2/10, leucocytosis ? Steroids ? Line sepsis.  Resolved.   PLAN -   Per ID MD   Canary Brim, NP-C [facility] Pgr: [phone] or [phone]  Advancing to 12 hours of PS now, continue weaning, otherwise as above.  Patient seen and examined, agree with above note.  I dictated the care and orders written for this patient under my direction.  Alyson Reedy, MD [phone]

## 2013-04-30 LAB — CBC
HEMATOCRIT: 25.8 % — AB (ref 36.0–46.0)
Hemoglobin: 8.2 g/dL — ABNORMAL LOW (ref 12.0–15.0)
MCH: 30.3 pg (ref 26.0–34.0)
MCHC: 31.8 g/dL (ref 30.0–36.0)
MCV: 95.2 fL (ref 78.0–100.0)
Platelets: 189 10*3/uL (ref 150–400)
RBC: 2.71 MIL/uL — ABNORMAL LOW (ref 3.87–5.11)
RDW: 16.6 % — AB (ref 11.5–15.5)
WBC: 17.6 10*3/uL — ABNORMAL HIGH (ref 4.0–10.5)

## 2013-04-30 LAB — BASIC METABOLIC PANEL
BUN: 37 mg/dL — AB (ref 6–23)
CO2: 29 meq/L (ref 19–32)
Calcium: 5.3 mg/dL — CL (ref 8.4–10.5)
Chloride: 97 mEq/L (ref 96–112)
Creatinine, Ser: 0.82 mg/dL (ref 0.50–1.10)
GFR calc Af Amer: 77 mL/min — ABNORMAL LOW (ref >90)
GFR calc non Af Amer: 67 mL/min — ABNORMAL LOW (ref >90)
GLUCOSE: 255 mg/dL — AB (ref 70–99)
Potassium: 5.7 mEq/L — ABNORMAL HIGH (ref 3.7–5.3)
Sodium: 143 mEq/L (ref 137–147)

## 2013-04-30 LAB — ALBUMIN: Albumin: 1.8 g/dL — ABNORMAL LOW (ref 3.5–5.2)

## 2013-04-30 LAB — PROTIME-INR
INR: 1.41 (ref 0.00–1.49)
Prothrombin Time: 16.9 seconds — ABNORMAL HIGH (ref 11.6–15.2)

## 2013-04-30 LAB — CALCIUM, IONIZED: Calcium, Ion: 1.13 mmol/L (ref 1.13–1.30)

## 2013-04-30 LAB — APTT: aPTT: 39 seconds — ABNORMAL HIGH (ref 24–37)

## 2013-05-01 ENCOUNTER — Other Ambulatory Visit (HOSPITAL_COMMUNITY): Payer: Self-pay

## 2013-05-01 LAB — BASIC METABOLIC PANEL
BUN: 38 mg/dL — ABNORMAL HIGH (ref 6–23)
CO2: 30 meq/L (ref 19–32)
Calcium: 7.8 mg/dL — ABNORMAL LOW (ref 8.4–10.5)
Chloride: 103 mEq/L (ref 96–112)
Creatinine, Ser: 0.88 mg/dL (ref 0.50–1.10)
GFR calc Af Amer: 71 mL/min — ABNORMAL LOW (ref >90)
GFR, EST NON AFRICAN AMERICAN: 61 mL/min — AB (ref >90)
Glucose, Bld: 166 mg/dL — ABNORMAL HIGH (ref 70–99)
POTASSIUM: 3.3 meq/L — AB (ref 3.7–5.3)
SODIUM: 145 meq/L (ref 137–147)

## 2013-05-01 LAB — PROTIME-INR
INR: 1.89 — ABNORMAL HIGH (ref 0.00–1.49)
Prothrombin Time: 21.1 seconds — ABNORMAL HIGH (ref 11.6–15.2)

## 2013-05-02 ENCOUNTER — Inpatient Hospital Stay (HOSPITAL_COMMUNITY): Payer: Medicare Other

## 2013-05-02 ENCOUNTER — Other Ambulatory Visit (HOSPITAL_COMMUNITY): Payer: Self-pay

## 2013-05-02 ENCOUNTER — Inpatient Hospital Stay (HOSPITAL_COMMUNITY)
Admission: AD | Admit: 2013-05-02 | Discharge: 2013-05-05 | DRG: 314 | Disposition: A | Discharge status: Other Acute Inpatient Hospital | Payer: Medicare Other | Source: Ambulatory Visit | Attending: Pulmonary Disease | Admitting: Pulmonary Disease

## 2013-05-02 ENCOUNTER — Telehealth: Payer: Self-pay | Admitting: Internal Medicine

## 2013-05-02 DIAGNOSIS — Z794 Long term (current) use of insulin: Secondary | ICD-10-CM

## 2013-05-02 DIAGNOSIS — I82B19 Acute embolism and thrombosis of unspecified subclavian vein: Secondary | ICD-10-CM | POA: Diagnosis present

## 2013-05-02 DIAGNOSIS — R7989 Other specified abnormal findings of blood chemistry: Secondary | ICD-10-CM

## 2013-05-02 DIAGNOSIS — Z950 Presence of cardiac pacemaker: Secondary | ICD-10-CM

## 2013-05-02 DIAGNOSIS — I442 Atrioventricular block, complete: Secondary | ICD-10-CM | POA: Diagnosis present

## 2013-05-02 DIAGNOSIS — I428 Other cardiomyopathies: Secondary | ICD-10-CM | POA: Diagnosis present

## 2013-05-02 DIAGNOSIS — G9340 Encephalopathy, unspecified: Secondary | ICD-10-CM | POA: Diagnosis present

## 2013-05-02 DIAGNOSIS — I509 Heart failure, unspecified: Secondary | ICD-10-CM | POA: Diagnosis present

## 2013-05-02 DIAGNOSIS — Z9109 Other allergy status, other than to drugs and biological substances: Secondary | ICD-10-CM

## 2013-05-02 DIAGNOSIS — R578 Other shock: Secondary | ICD-10-CM | POA: Diagnosis present

## 2013-05-02 DIAGNOSIS — I10 Essential (primary) hypertension: Secondary | ICD-10-CM | POA: Diagnosis present

## 2013-05-02 DIAGNOSIS — R197 Diarrhea, unspecified: Secondary | ICD-10-CM | POA: Diagnosis present

## 2013-05-02 DIAGNOSIS — R945 Abnormal results of liver function studies: Secondary | ICD-10-CM

## 2013-05-02 DIAGNOSIS — N179 Acute kidney failure, unspecified: Secondary | ICD-10-CM | POA: Diagnosis present

## 2013-05-02 DIAGNOSIS — J9600 Acute respiratory failure, unspecified whether with hypoxia or hypercapnia: Secondary | ICD-10-CM

## 2013-05-02 DIAGNOSIS — R791 Abnormal coagulation profile: Secondary | ICD-10-CM | POA: Diagnosis present

## 2013-05-02 DIAGNOSIS — K922 Gastrointestinal hemorrhage, unspecified: Secondary | ICD-10-CM | POA: Diagnosis present

## 2013-05-02 DIAGNOSIS — J9620 Acute and chronic respiratory failure, unspecified whether with hypoxia or hypercapnia: Secondary | ICD-10-CM

## 2013-05-02 DIAGNOSIS — Z930 Tracheostomy status: Secondary | ICD-10-CM

## 2013-05-02 DIAGNOSIS — I82A19 Acute embolism and thrombosis of unspecified axillary vein: Secondary | ICD-10-CM | POA: Diagnosis present

## 2013-05-02 DIAGNOSIS — J9690 Respiratory failure, unspecified, unspecified whether with hypoxia or hypercapnia: Secondary | ICD-10-CM | POA: Diagnosis present

## 2013-05-02 DIAGNOSIS — Z86718 Personal history of other venous thrombosis and embolism: Secondary | ICD-10-CM

## 2013-05-02 DIAGNOSIS — I4891 Unspecified atrial fibrillation: Secondary | ICD-10-CM | POA: Diagnosis present

## 2013-05-02 DIAGNOSIS — I82629 Acute embolism and thrombosis of deep veins of unspecified upper extremity: Secondary | ICD-10-CM

## 2013-05-02 DIAGNOSIS — D696 Thrombocytopenia, unspecified: Secondary | ICD-10-CM | POA: Diagnosis present

## 2013-05-02 DIAGNOSIS — Z8249 Family history of ischemic heart disease and other diseases of the circulatory system: Secondary | ICD-10-CM

## 2013-05-02 DIAGNOSIS — Z823 Family history of stroke: Secondary | ICD-10-CM

## 2013-05-02 DIAGNOSIS — Z7982 Long term (current) use of aspirin: Secondary | ICD-10-CM

## 2013-05-02 DIAGNOSIS — D62 Acute posthemorrhagic anemia: Secondary | ICD-10-CM

## 2013-05-02 DIAGNOSIS — N189 Chronic kidney disease, unspecified: Secondary | ICD-10-CM

## 2013-05-02 DIAGNOSIS — E46 Unspecified protein-calorie malnutrition: Secondary | ICD-10-CM | POA: Diagnosis present

## 2013-05-02 DIAGNOSIS — T45515A Adverse effect of anticoagulants, initial encounter: Secondary | ICD-10-CM | POA: Diagnosis present

## 2013-05-02 DIAGNOSIS — I5032 Chronic diastolic (congestive) heart failure: Secondary | ICD-10-CM | POA: Diagnosis present

## 2013-05-02 DIAGNOSIS — Z888 Allergy status to other drugs, medicaments and biological substances status: Secondary | ICD-10-CM

## 2013-05-02 DIAGNOSIS — Z803 Family history of malignant neoplasm of breast: Secondary | ICD-10-CM

## 2013-05-02 DIAGNOSIS — J8410 Pulmonary fibrosis, unspecified: Secondary | ICD-10-CM | POA: Diagnosis present

## 2013-05-02 DIAGNOSIS — Z833 Family history of diabetes mellitus: Secondary | ICD-10-CM

## 2013-05-02 DIAGNOSIS — Z79899 Other long term (current) drug therapy: Secondary | ICD-10-CM

## 2013-05-02 DIAGNOSIS — R58 Hemorrhage, not elsewhere classified: Secondary | ICD-10-CM

## 2013-05-02 DIAGNOSIS — J9589 Other postprocedural complications and disorders of respiratory system, not elsewhere classified: Secondary | ICD-10-CM | POA: Diagnosis present

## 2013-05-02 DIAGNOSIS — E785 Hyperlipidemia, unspecified: Secondary | ICD-10-CM | POA: Diagnosis present

## 2013-05-02 DIAGNOSIS — E039 Hypothyroidism, unspecified: Secondary | ICD-10-CM | POA: Diagnosis present

## 2013-05-02 DIAGNOSIS — I82409 Acute embolism and thrombosis of unspecified deep veins of unspecified lower extremity: Secondary | ICD-10-CM

## 2013-05-02 DIAGNOSIS — Z7901 Long term (current) use of anticoagulants: Secondary | ICD-10-CM

## 2013-05-02 LAB — BLOOD GAS, ARTERIAL
Acid-base deficit: 7.2 mmol/L — ABNORMAL HIGH (ref 0.0–2.0)
BICARBONATE: 16.7 meq/L — AB (ref 20.0–24.0)
FIO2: 1 %
LHR: 22 {breaths}/min
MECHVT: 500 mL
O2 SAT: 98.7 %
PATIENT TEMPERATURE: 98.6
PEEP: 5 cmH2O
TCO2: 17.5 mmol/L (ref 0–100)
pCO2 arterial: 27.4 mmHg — ABNORMAL LOW (ref 35.0–45.0)
pH, Arterial: 7.401 (ref 7.350–7.450)
pO2, Arterial: 383 mmHg — ABNORMAL HIGH (ref 80.0–100.0)

## 2013-05-02 LAB — CBC
HCT: 21.7 % — ABNORMAL LOW (ref 36.0–46.0)
HEMOGLOBIN: 6.9 g/dL — AB (ref 12.0–15.0)
MCH: 30.1 pg (ref 26.0–34.0)
MCHC: 31.8 g/dL (ref 30.0–36.0)
MCV: 94.8 fL (ref 78.0–100.0)
Platelets: ADEQUATE 10*3/uL (ref 150–400)
RBC: 2.29 MIL/uL — AB (ref 3.87–5.11)
RDW: 17.9 % — ABNORMAL HIGH (ref 11.5–15.5)
WBC: 30.7 10*3/uL — ABNORMAL HIGH (ref 4.0–10.5)

## 2013-05-02 LAB — BASIC METABOLIC PANEL
BUN: 54 mg/dL — ABNORMAL HIGH (ref 6–23)
CHLORIDE: 97 meq/L (ref 96–112)
CO2: 26 meq/L (ref 19–32)
Calcium: 7.9 mg/dL — ABNORMAL LOW (ref 8.4–10.5)
Creatinine, Ser: 1.2 mg/dL — ABNORMAL HIGH (ref 0.50–1.10)
GFR calc Af Amer: 49 mL/min — ABNORMAL LOW (ref >90)
GFR calc non Af Amer: 42 mL/min — ABNORMAL LOW (ref >90)
Glucose, Bld: 264 mg/dL — ABNORMAL HIGH (ref 70–99)
Potassium: 3.1 mEq/L — ABNORMAL LOW (ref 3.7–5.3)
Sodium: 140 mEq/L (ref 137–147)

## 2013-05-02 LAB — GLUCOSE, CAPILLARY
GLUCOSE-CAPILLARY: 204 mg/dL — AB (ref 70–99)
Glucose-Capillary: 250 mg/dL — ABNORMAL HIGH (ref 70–99)

## 2013-05-02 LAB — PROTIME-INR
INR: 2.89 — ABNORMAL HIGH (ref 0.00–1.49)
Prothrombin Time: 29.2 seconds — ABNORMAL HIGH (ref 11.6–15.2)

## 2013-05-02 LAB — PREPARE RBC (CROSSMATCH)

## 2013-05-02 LAB — CLOSTRIDIUM DIFFICILE BY PCR: Toxigenic C. Difficile by PCR: NEGATIVE

## 2013-05-02 LAB — MRSA PCR SCREENING: MRSA by PCR: NEGATIVE

## 2013-05-02 MED ORDER — VITAMIN K1 10 MG/ML IJ SOLN
5.0000 mg | Freq: Once | INTRAVENOUS | Status: AC
  Administered 2013-05-02: 5 mg via INTRAVENOUS
  Filled 2013-05-02: qty 0.5

## 2013-05-02 MED ORDER — LEVOTHYROXINE SODIUM 100 MCG IV SOLR: 75.0000 ug | Freq: Every day | INTRAVENOUS | Status: DC

## 2013-05-02 MED ORDER — LEVOTHYROXINE SODIUM 100 MCG IV SOLR
37.5000 ug | Freq: Every day | INTRAVENOUS | Status: DC
  Administered 2013-05-03 – 2013-05-05 (×3): 37.5 ug via INTRAVENOUS
  Filled 2013-05-02 (×3): qty 5

## 2013-05-02 MED ORDER — PANTOPRAZOLE SODIUM 40 MG IV SOLR: 40.0000 mg | Freq: Two times a day (BID) | INTRAVENOUS | Status: DC

## 2013-05-02 MED ORDER — SODIUM CHLORIDE 0.9 % IV SOLN
8.0000 mg/h | INTRAVENOUS | Status: DC
  Administered 2013-05-02 – 2013-05-03 (×2): 8 mg/h via INTRAVENOUS
  Filled 2013-05-02 (×5): qty 80

## 2013-05-02 MED ORDER — SODIUM CHLORIDE 0.9 % IV SOLN
80.0000 mg | Freq: Once | INTRAVENOUS | Status: AC
  Administered 2013-05-02: 80 mg via INTRAVENOUS
  Filled 2013-05-02: qty 80

## 2013-05-02 MED ORDER — SODIUM CHLORIDE 0.9 % IV SOLN: 250.0000 mL | INTRAVENOUS | Status: DC | PRN

## 2013-05-02 MED ORDER — FAMOTIDINE IN NACL 20-0.9 MG/50ML-% IV SOLN
20.0000 mg | Freq: Two times a day (BID) | INTRAVENOUS | Status: DC
  Filled 2013-05-02: qty 50

## 2013-05-02 NOTE — H&P (Signed)
Name: [name] MRN: [id number] DOB: 02/03/1933    ADMISSION DATE:  05/02/2013 CONSULTATION DATE:  05/02/2013  [facility] :  [facility] PRIMARY SERVICE: [facility]  CHIEF COMPLAINT:  Shock  BRIEF PATIENT DESCRIPTION: 80 y/o F admitted 2/2 with norovirus gastroenteritis, respiratory failure, hypovolemic and presumed septic shock -source unclear. Course complicated by ARDS, AKI. 2/8 am suffered mucus plugging with ETT obstruction with subsequent cardiac arrest. Required CPR x 9 mins, improved with re-intubation & left pneumothorax decompression. Failed extubation on 2/18 due to severe deconditioning & hypoxia & required tstomy. [facility]  Pt found to have L  DVT and was rx'd with lovenox / coumadin.  3/2 noted to have hemorrhagic shock, Tx back to MICU.    SIGNIFICANT EVENTS / STUDIES:  2/03 - Admit with fever, SOB, vomiting, diarrhea (concern for food poisoning) & CXR concerning for interstitial pneumonitis  2/03 - 2 d echo>>>Ef 45% grade 1 diastolic dysfunction  2/08 - resp arrest with mucus plugging, ETT exchanged  2/09 - changed to PCV overnight  2/10 - febrile overnight, hypotension requiring pressors  2/11 - CT Chest/ABD/Pelvis>>ARDS, R pleural effusion, L CT, Air in sq of anterior abd with marked inflammation  2/11 - CT Head>>neg  2/14 - fever curve down but wbc up 33K . Still +4L in 7 days but even balance x 3 dauys. Resp secretions improved but cxr stil looks bad  2/16 - extubated, required bipap post extubation  2/17 duplex neg BLE  2/20 - to IR for PICC line, more alert  2/20 - [facility]  .............................................................................................................................................................................................................  2/24 - L  & Axillary Vein DVT 2/25 - fatigued after sitting up in chair on 2/24 for 3 hours, fiO2 to 45 %, Peep 8  2/27 - mild sedation after klonopin, weaning on PSV  14/5, 40% fiO2 .............................................................................................................................................................................................................. 3/2 - [facility]   LINES / TUBES: Trach 2/18 >>> LUE PICC 2/20 >>>  CULTURES: MRSA PCR 2/3>>> negative  BCx2 2/3>>>neg  Sputum 2/3>>> normal flora  UA 2/3>>> negative nit and LE  Resp Viral Panel 2/3>>>negative  2/4 tracheal aspirate>>normal flora  Stool culture, o&p 2/15>>>neg  CDiff PCR 04/11/13>>neg  ..................  BCx2 2/11>>>ng  Sputum 2/11>>>few candidate  UA 2/11>>>many yeast  UC 2/11>>>greater 100k yeast  ...................  Stool O&P 2/17>>>neg  ..................................................... C-Diff 3/2>>>  ANTIBIOTICS: Rocephin 2/3>>>2/9  Azithro 2/3>>>2/9  Tamiflu 2/3>>>2/4  ............  Clinda 2/12>>>2/15  Vanco 2/12>>>2/15  Zosyn 2/12>>>  Diflucan 2/12 (yeas in urine) >>>2/14  Vanc 2/17>>>  .......................................   HISTORY OF PRESENT ILLNESS:   80 y/o F admitted 2/2 with norovirus gastroenteritis,respiratory failure, hypovolemic and presumed septic shock - source unclear. Course complicated by ARDS, AKI. 2/8 am suffered mucus plugging with ETT obstruction with subsequent cardiac arrest. Required CPR x 9 mins, improved with re-intubation & left pneumothorax decompression. Failed extubation on 2/18 due to severe deconditioning & hypoxia & required tstomy. Unfortunately, she has had ongoing fevers that have not had clear defervescence. Cultures & imaging are negative for overt ongoing infectious process.  [facility]18 [facility] with ARDS non specific lung disease (? Viral pneumonitis, amiodarone or other drug toxicity).  2/27 she was seen by PCCM and was working towards trach collar and was doing well. 3/2 patient became hypotensive, pale, tachycardic and a hemoglobin  drop to 6 was noted. She was hypotensive and felt to have hemorrhagic shock, Transferred back to ICU.  PAST MEDICAL HISTORY :  Past Medical History  Diagnosis Date  . Hypothyroid   . Complete heart block 2002    Initial PPM 2002 with upgrade by JA to CRT-P 11/21/08  . Nonischemic cardiomyopathy   . Persistent atrial fibrillation   . Hypertension   . Hyperlipidemia   . Hives 4/10  . Cataract 4/13    right  . Renal failure     3rd stage-seeing neurologist   Past Surgical History  Procedure Laterality Date  . Pacemaker insertion  2002, 2010    PPM 2002, upgrade to CRT-P by Dr Johney Frame (MDT 2010)  . Partial colectomy  1/04  . Abdominal hysterectomy  1987    TAH  . Bunionectomy Right 1996  . Breast biopsy Left 01/1997  . Cataract extraction  4/13    both eyes  . Cyst excision      on back of left ear   Prior to Admission medications   Medication Sig Start Date End Date Taking? Authorizing Provider  acetaminophen (TYLENOL) 160 MG/5ML solution Place 20.3 mLs (650 mg total) into feeding tube every 6 (six) hours as needed for fever. 04/22/13   Jeanella Craze, NP  acetylcysteine (MUCOMYST) 20 % nebulizer solution Take 2 mLs by nebulization 2 (two) times daily. 04/22/13   Jeanella Craze, NP  Amino Acids-Protein Hydrolys (FEEDING SUPPLEMENT, PRO-STAT SUGAR FREE 64,) LIQD Place 30 mLs into feeding tube 3 (three) times daily. 04/22/13   Jeanella Craze, NP  aspirin 325 MG tablet Take 325 mg by mouth daily.      Historical Provider, MD  chlorhexidine (PERIDEX) 0.12 % solution 15 mLs by Mouth Rinse route 2 (two) times daily. 04/22/13   Jeanella Craze, NP  dexmedetomidine (PRECEDEX) 200 MCG/50ML SOLN Inject 34.92-104.76 mcg/hr into the vein continuous. 04/22/13   Jeanella Craze, NP  fentaNYL (SUBLIMAZE) 0.05 MG/ML injection Inject 1-2 mLs (50-100 mcg total) into the vein every 2 (two) hours as needed for severe pain (or RASS > 0). 04/22/13   Jeanella Craze, NP  folic acid (FOLVITE) 1 MG tablet Take 1  mg by mouth daily.      Historical Provider, MD  heparin 5000 UNIT/ML injection Inject 1 mL (5,000 Units total) into the skin every 8 (eight) hours. 04/22/13   Jeanella Craze, NP  insulin aspart (NOVOLOG) 100 UNIT/ML injection Inject 0-15 Units into the skin every 4 (four) hours. 04/22/13   Jeanella Craze, NP  ipratropium-albuterol (DUONEB) 0.5-2.5 (3) MG/3ML SOLN Take 3 mLs by nebulization every 6 (six) hours. 04/22/13   Jeanella Craze, NP  levothyroxine (SYNTHROID, LEVOTHROID) 75 MCG tablet Take 75 mcg by mouth daily after breakfast.    Historical Provider, MD  methylPREDNISolone sodium succinate (SOLU-MEDROL) 40 mg/mL injection Inject 1 mL (40 mg total) into the vein daily. 04/22/13   Jeanella Craze, NP  Multiple Vitamin (MULTIVITAMIN) LIQD Place 5 mLs into feeding tube daily. 04/22/13   Jeanella Craze, NP  Nutritional Supplements (FEEDING SUPPLEMENT, OXEPA,) LIQD Place 1,000 mLs into feeding tube daily. Rate of 40 ml/hr 04/22/13   Jeanella Craze, NP  pantoprazole sodium (PROTONIX) 40 mg/20 mL PACK Place 20 mLs (40 mg total) into feeding tube daily. 04/22/13   Jeanella Craze, NP  piperacillin-tazobactam (ZOSYN) 3.375 GM/50ML IVPB Inject 50 mLs (3.375 g total) into the vein every 8 (eight) hours. 04/22/13   Jeanella Craze, NP  sodium chloride 0.9 % SOLN 200 mL with fentaNYL 0.05 MG/ML SOLN  2,500 mcg Titrate up to 200 mcg/hr for comfort. 04/22/13   Jeanella Craze, NP  sodium chloride 0.9 % SOLN 250 mL with vancomycin 10 G SOLR 1250mg  Q24 hours. To be dosed per pharmacy 04/22/13   Jeanella Craze, NP  Water For Irrigation, Sterile (FREE WATER) SOLN Place 350 mLs into feeding tube every 6 (six) hours. 04/22/13   Jeanella Craze, NP   Allergies  Allergen Reactions  . Beta Adrenergic Blockers Other (See Comments)    Not working well for pt.  Sherrie Mustache Hcl] Shortness Of Breath  . Carvedilol Shortness Of Breath    Chest  Discomfort.  . Dapsone Other (See Comments)    Kidney  failure  . Diclofenac  Sodium Other (See Comments)    Causes  Fluid  Retention.  Consuelo Pandy [Pitavastatin Calcium] Shortness Of Breath  . Methotrexate Shortness Of Breath    Mouth sores.  . Metoprolol Succinate Hives  . Oxaprozin Diarrhea  . Other     Fragrance mixtures -  Cinol botanicals  &  Plants oil. Cocamidoproy   -   Betaine. Lipstick & other makeup & cleaning materials.   Marland Kitchen Epinephrine     FAMILY HISTORY:  Family History  Problem Relation Age of Onset  . Diabetes Maternal Aunt   . Breast cancer Maternal Aunt   . Stroke Mother   . Stroke Father   . Hypothyroidism Sister   . Hypothyroidism Son   . Hypothyroidism Mother   . Hypertension Father   . Kidney disease Father     renal problems   SOCIAL HISTORY:  reports that she has never smoked. She has never used smokeless tobacco. She reports that she does not drink alcohol or use illicit drugs.  REVIEW OF SYSTEMS:  Unable to complete as pt is altered on vent.   SUBJECTIVE: Awaiting blood to arrive from blood bank.  Otherwise stable.  VITAL SIGNS:   HEMODYNAMICS:   VENTILATOR SETTINGS:   INTAKE / OUTPUT: Intake/Output   None     PHYSICAL EXAMINATION: General: Chronically ill appearing female, debilitated, NAD  Neuro: Awake, nods appropriately, follows commands  HEENT: Mm dry, no JVD, trach c/d  Cardiovascular: s1s2 rrr  Lungs: tachypnea, on full support, diminished bases, few scattered rhonchi  Abdomen: Soft, +bs  Musculoskeletal: Warm and dry, generalized edema improved, L>R BUE edema  LABS:  CBC  Recent Labs Lab 04/28/13 0305 04/30/13 0500 05/02/13 0500  WBC 22.3* 17.6* 30.7*  HGB 9.2* 8.2* 6.9*  HCT 29.5* 25.8* 21.7*  PLT PLATELET CLUMPING, SUGGEST RECOLLECTION OF SAMPLE IN CITRATE TUBE. 189 PLATELET CLUMPS NOTED ON SMEAR, COUNT APPEARS ADEQUATE   Coag's  Recent Labs Lab 04/30/13 0500 05/01/13 0500 05/02/13 0500  APTT 39*  --   --   INR 1.41 1.89* 2.89*   BMET  Recent Labs Lab 04/30/13 0500  05/01/13 0500 05/02/13 0500  NA 143 145 140  K 5.7* 3.3* 3.1*  CL 97 103 97  CO2 29 30 26   BUN 37* 38* 54*  CREATININE 0.82 0.88 1.20*  GLUCOSE 255* 166* 264*   Electrolytes  Recent Labs Lab 04/30/13 0500 05/01/13 0500 05/02/13 0500  CALCIUM 5.3* 7.8* 7.9*   ABG  Recent Labs Lab 04/28/13 0500 04/28/13 1730 05/02/13 1420  PHART 7.493* 7.503* 7.401  PCO2ART 40.1 37.7 27.4*  PO2ART 206.0* 75.0* 383.0*   Liver Enzymes  Recent Labs Lab 04/30/13 1041  ALBUMIN 1.8*    Imaging Dg Chest Port 1 View  05/02/2013  CLINICAL DATA:  Evaluate possible pneumonitis  EXAM: PORTABLE CHEST - 1 VIEW  COMPARISON:  DG CHEST 1V PORT dated 05/01/2013  FINDINGS: Low lung volumes. Stable bilateral airspace opacities identified. No focal regions of consolidation. The cardiac silhouette and mediastinal contours are stable. Endotracheal tube is appreciated with tip at the level of the clavicles. A left chest wall dual chamber cardiac pacing unit is appreciated stable. An NG tube is identified tip not on the view of this study. The osseous structures unremarkable.  IMPRESSION: Stable bilateral pulmonary opacities, with differential considerations as stated previously. . No new focal regions consolidation are new focal infiltrates. Support apparatus is also stable.   Electronically Signed   By: Salome Holmes M.D.   On: 05/02/2013 08:19   Dg Chest Port 1 View  05/01/2013   CLINICAL DATA:  Left-sided pneumothorax, history of hypertension  EXAM: PORTABLE CHEST - 1 VIEW  COMPARISON:  DG CHEST 1V PORT dated 04/23/2013; DG CHEST 1V PORT dated 04/22/2013; DG CHEST 1V PORT dated 04/19/2013; DG CHEST 1V PORT dated 04/21/2013; CT CHEST W/O CM dated 04/13/2013; DG CHEST 1V PORT dated 04/13/2013  FINDINGS: Grossly unchanged cardiac silhouette and mediastinal contours. Interval removal of left-sided pigtail chest to without evidence of pneumothorax. Otherwise, stable positioning remaining support apparatus. Extensive  bilateral heterogeneous airspace opacities, left greater than right, are grossly unchanged. No new focal airspace opacities. Trace left-sided effusion is not excluded. Unchanged bones.  IMPRESSION: 1. Interval removal of left-sided chest tube without evidence of pneumothorax. 2. Grossly unchanged extensive bilateral airspace opacities, left greater than right, worrisome for residual multifocal infection (including atypical etiologies) and/or ARDS.   Electronically Signed   By: Simonne Come M.D.   On: 05/01/2013 09:30   Dg Abd Portable 1v  05/02/2013   CLINICAL DATA:  Nasogastric tube placement.  EXAM: PORTABLE ABDOMEN - 1 VIEW  COMPARISON:  04/22/2013.  FINDINGS: The distal end of the nasogastric tube is seen coiled in the proximal stomach. Gas is noted throughout the colon. No pathologic distention of small bowel. No gross evidence of pneumoperitoneum.  IMPRESSION: 1. Tip and side-port of nasogastric tube are within the proximal stomach.   Electronically Signed   By: Trudie Reed M.D.   On: 05/02/2013 14:34    ASSESSMENT / PLAN:  PULMONARY A: Acute on Chronic Respiratory Failure s/p trach ARDS Mucus Plugging s/p Arrest 2/8 L PTX s/p Chest Tube P:    - Full vent support, wean as able. - Pulmonary hygiene. - Keep head of bed elevated given possible UGI bleed.  CARDIOVASCULAR A:  Hemorrhagic Shock (3/2)  Hx Recent Septic and Hypovolemic Shock - transiently required pressors. Initial admit at [facility], now resolved.  Cardiac arrest 2/8 am due to ETT obstruction  Hx HTN  Hx CHF / ICM s/p Pacemaker - compensated. EF 55-60% 2/10  AFib - on amiodarone for years, d/c'd on admit to [facility]  DVT L [place] / Axillary Vein P:  - Transfuse 2 unit FFP, 2 units PRBC. - Monitor BP, goal systolic < 160. - Will CT abdo/pelvis to investigate bleeding source. - hold anti-coagulation for now - will need to assess whether to change central venous access  RENAL A:   Acute Kidney Injury - in setting of hypovolemic  shock  P:   - BMP, Mag, Phos in AM.  GASTROINTESTINAL A:   Concern for GI Bleed 3/02 Abd pain 3/02 SUP Protein Calorie Malnutrition Diarrhea  P:   - GI Consult, possible EGD. - Protonix drip. -  NPO for now, will revisit in AM. - Assess C.Diff PCR as below  HEMATOLOGIC A:   DVT L [place] / Axillary Vein Hemorrhagic Shock - noted 3/2 with hgb drop P:  - Hold all anticoagulation.  - FFP x 1, PRBC's x 2. - H/H q 6 x 3. - Follow CBC  INFECTIOUS A:   Rule Out C-Diff - ongoing diarrhea, has had since [facility]. P:   - Assess C-Diff PCR.  ENDOCRINE A:  Hypothyroidism P:   - Continue Synthroid IV at half oral dose - Monitor CBG's.  NEUROLOGIC A: Acute encephalopathy. P:   - Monitor.  Rutherford Guys, PA - C [facility] Pgr: [phone]  or [phone]  Reviewed above, examined pt, and documentation changes made as needed.  80 yo female with recent complicated ICU stay at [facility] from gastroenteritis with ARDS, VDRF, cardiac arrest, PTX s/p trach.  She was transferred to [facility] 2/20 for further rehab and wean from vent.  She was found to have bleeding from her mouth, hypotension, Abd pain on 3/02 and transferred to [facility] ICU.  She was on anti-coagulation for recent Dx of Lt upper extremity DVT from PICC line.  Will transfuse FFP, PRBC, and give vitamin K.  Add protonix gtt for now.  GI consulted to assess need for upper endoscopy.  Check C diff in setting of diarrhea.  Check Abd CT to further assess abdominal pain.  CC time 60 minutes.  Coralyn Helling, MD [facility] 05/02/2013, 5:10 PM Pager:  [phone] After 3pm call: [phone]

## 2013-05-02 NOTE — Consult Note (Signed)
Inpatient Consult Note Reason for consult: ? GI bleeding Consulting MD: Craige Cotta PCCM   HPI :  80 yo female with recent complicated medical history and prolonged hospitalization for Norvasc gastroenteritis complicated by respiratory failure/ARDS STATUS post tracheostomy placement, acute kidney injury, cardiac arrest in the setting of mucous plug, left pneumothorax status post decompression, severe deconditioning [facility] now readmitted to ICU with concern for hemorrhagic shock.  Today she became hypotensive, tachycardic with blood in her mouth.  Hgb decline to 6.9 from mid 8 range.  Currently she has NGT in place. Trach in place.  Pt unable to provide verbal history but does open eyes to voice and following commands.  2u FFP already given and RN beginning 1st of 2 pRBC previously ordered by ICU team   Past Medical History  Diagnosis Date  . Hypothyroid   . Complete heart block 2002    Initial PPM 2002 with upgrade by JA to CRT-P 11/21/08  . Nonischemic cardiomyopathy   . Persistent atrial fibrillation   . Hypertension   . Hyperlipidemia   . Hives 4/10  . Cataract 4/13    right  . Renal failure     3rd stage-seeing neurologist      Family History  Problem Relation Age of Onset  . Diabetes Maternal Aunt   . Breast cancer Maternal Aunt   . Stroke Mother   . Stroke Father   . Hypothyroidism Sister   . Hypothyroidism Son   . Hypothyroidism Mother   . Hypertension Father   . Kidney disease Father     renal problems   History  Substance Use Topics  . Smoking status: Never Smoker   . Smokeless tobacco: Never Used  . Alcohol Use: No   Current Facility-Administered Medications  Medication Dose Route Frequency Provider Last Rate Last Dose  . 0.9 %  sodium chloride infusion  250 mL Intravenous PRN Rahul P Desai, PA-C      . levothyroxine (SYNTHROID, LEVOTHROID) injection 37.5 mcg  37.5 mcg Intravenous Daily Coralyn Helling, MD       . pantoprazole (PROTONIX) 80 mg in sodium chloride 0.9 % 250 mL infusion  8 mg/hr Intravenous Continuous Coralyn Helling, MD      . Melene Muller ON 05/06/2013] pantoprazole (PROTONIX) injection 40 mg  40 mg Intravenous Q12H Coralyn Helling, MD      . phytonadione (VITAMIN K) 5 mg in dextrose 5 % 50 mL IVPB  5 mg Intravenous Once Coralyn Helling, MD       Allergies  Allergen Reactions  . Beta Adrenergic Blockers Other (See Comments)    Not working well for pt.  Sherrie Mustache Hcl] Shortness Of Breath  . Carvedilol Shortness Of Breath    Chest  Discomfort.  . Dapsone Other (See Comments)    Kidney  failure  . Diclofenac Sodium Other (See Comments)    Causes  Fluid  Retention.  Consuelo Pandy [Pitavastatin Calcium] Shortness Of Breath  . Methotrexate Shortness Of Breath    Mouth sores.  . Metoprolol Succinate Hives  . Oxaprozin Diarrhea  . Other     Fragrance mixtures -  Cinol botanicals  &  Plants oil. Cocamidoproy   -   Betaine. Lipstick & other makeup & cleaning materials.   Marland Kitchen Epinephrine      Review of Systems: All systems reviewed and negative except where noted in HPI.    Ct Abdomen Pelvis Wo Contrast  04/13/2013   CLINICAL DATA:  ARDS.  Infiltrates.  Abdominal pain.  EXAM: CT CHEST, ABDOMEN AND PELVIS WITHOUT CONTRAST  TECHNIQUE: Multidetector CT imaging of the chest, abdomen and pelvis was performed following the standard protocol without IV contrast.  COMPARISON:  None.  FINDINGS: CT CHEST FINDINGS  The chest wall is unremarkable. No breast masses, supraclavicular or axillary adenopathy. A left-sided pacemaker is noted. The bony thorax is intact. No destructive bone lesions or spinal canal compromise.  The heart is mildly enlarged but no pericardial effusion. The aorta is normal in caliber. Atherosclerotic calcifications are noted. No mediastinal or hilar mass or adenopathy. Scattered lymph nodes are noted. The esophagus is grossly normal. There is a NG tube coursing down into the stomach. A  left-sided chest tube is noted.  Examination of the lung parenchyma demonstrates diffuse patchy airspace disease likely edema or ARDS. There is a moderate-sized right pleural effusion and bibasilar atelectasis. No pneumothorax is identified.  CT ABDOMEN AND PELVIS FINDINGS  The liver is grossly normal without contrast. No obvious lesion or biliary dilatation. The gallbladder is filled with high attenuation material which could be vicarious excretion related to a prior contrast-enhanced study or milk of calcium. No common bile duct dilatation. The pancreas is grossly normal. The spleen is normal in size. There is a left adrenal gland lesion which is an indeterminate finding measuring 18 Hounsfield units. No findings for hydronephrosis or lobe both ureters are slightly prominent. This could be due to a distended bladder despite a Foley catheter.  The stomach, duodenum, small bowel and colon are grossly normal without oral contrast. No obvious inflammatory changes, mass lesions or obstructive findings. The uterus is surgically absent. Both ovaries are still present are grossly normal. Moderate atherosclerotic changes involving the aorta but no focal aneurysm. No mesenteric or retroperitoneal mass.  There is extensive air in the subcutaneous tissues of the anterior abdominal wall along with some air in the left rectus muscle. The right lower chest wall and upper right abdominal wall demonstrate large amount of fluid in inflammation. There is also a small amount of free intraperitoneal air. These findings could be due to a prior pneumothorax with dissection of air. Necrotizing fasciitis is also a remote possibility.  The bony structures are unremarkable. There is advanced degenerative disc disease and facet disease in the lower lumbar spine.  IMPRESSION: 1. Extensive airspace disease in the lungs likely due to ARDS. 2. Moderate-sized right pleural effusion with overlying atelectasis. 3. Left-sided chest tube in place  without definite pneumothorax. 4. Extensive air in the subcutaneous fat of the anterior abdominal wall along with marked inflammation and edema. This also scattered small areas of free intraperitoneal air. It is possible is dissecting down from a large pneumothorax. Has the patient had recent procedure? Necrotizing fasciitis is possible. 5. Distended bladder despite the Foley catheter. There is also mild hydroureter. 6. High attenuation material the gallbladder possibly due to vicarious excretion or milk of calcium   Electronically Signed   By: Loralie Champagne M.D.   On: 04/13/2013 17:31   Ct Head Wo Contrast  04/13/2013   CLINICAL DATA:  Status post cardiac arrest. Altered level of consciousness. Not following commands.  EXAM: CT HEAD WITHOUT CONTRAST  TECHNIQUE: Contiguous axial images were obtained from the base of the skull through the vertex without contrast.  COMPARISON:  CT head 03/15/2010.  FINDINGS: No evidence for acute infarction, hemorrhage, mass lesion, hydrocephalus, or extra-axial fluid. Normal for age cerebral volume. No significant white matter disease. Calvarium intact. Vascular calcification  of the carotid siphon regions. Large retention cyst left maxillary sinus. Dependent fluid both mastoids and left middle ear cavity, nonspecific.  There are no specific features which suggest hypoxic-ischemic insult. Gray-white junction is preserved in the supratentorial region. There is no diffuse brain swelling or obliteration of the basilar cisterns. There is no evidence for large vessel infarct. Continued surveillance is warranted. If clinically indicated, MRI can be more sensitive in the detection of anoxic injury. Similar appearance to 2012.  IMPRESSION: Normal for age cerebral volume.  No acute intracranial findings.  Specifically, no specific features of cerebral anoxia or brain swelling are evident on this examination.   Electronically Signed   By: Davonna Belling M.D.   On: 04/13/2013 16:40   Ct  Chest Wo Contrast  04/13/2013   CLINICAL DATA:  ARDS.  Infiltrates.  Abdominal pain.  EXAM: CT CHEST, ABDOMEN AND PELVIS WITHOUT CONTRAST  TECHNIQUE: Multidetector CT imaging of the chest, abdomen and pelvis was performed following the standard protocol without IV contrast.  COMPARISON:  None.  FINDINGS: CT CHEST FINDINGS  The chest wall is unremarkable. No breast masses, supraclavicular or axillary adenopathy. A left-sided pacemaker is noted. The bony thorax is intact. No destructive bone lesions or spinal canal compromise.  The heart is mildly enlarged but no pericardial effusion. The aorta is normal in caliber. Atherosclerotic calcifications are noted. No mediastinal or hilar mass or adenopathy. Scattered lymph nodes are noted. The esophagus is grossly normal. There is a NG tube coursing down into the stomach. A left-sided chest tube is noted.  Examination of the lung parenchyma demonstrates diffuse patchy airspace disease likely edema or ARDS. There is a moderate-sized right pleural effusion and bibasilar atelectasis. No pneumothorax is identified.  CT ABDOMEN AND PELVIS FINDINGS  The liver is grossly normal without contrast. No obvious lesion or biliary dilatation. The gallbladder is filled with high attenuation material which could be vicarious excretion related to a prior contrast-enhanced study or milk of calcium. No common bile duct dilatation. The pancreas is grossly normal. The spleen is normal in size. There is a left adrenal gland lesion which is an indeterminate finding measuring 18 Hounsfield units. No findings for hydronephrosis or lobe both ureters are slightly prominent. This could be due to a distended bladder despite a Foley catheter.  The stomach, duodenum, small bowel and colon are grossly normal without oral contrast. No obvious inflammatory changes, mass lesions or obstructive findings. The uterus is surgically absent. Both ovaries are still present are grossly normal. Moderate  atherosclerotic changes involving the aorta but no focal aneurysm. No mesenteric or retroperitoneal mass.  There is extensive air in the subcutaneous tissues of the anterior abdominal wall along with some air in the left rectus muscle. The right lower chest wall and upper right abdominal wall demonstrate large amount of fluid in inflammation. There is also a small amount of free intraperitoneal air. These findings could be due to a prior pneumothorax with dissection of air. Necrotizing fasciitis is also a remote possibility.  The bony structures are unremarkable. There is advanced degenerative disc disease and facet disease in the lower lumbar spine.  IMPRESSION: 1. Extensive airspace disease in the lungs likely due to ARDS. 2. Moderate-sized right pleural effusion with overlying atelectasis. 3. Left-sided chest tube in place without definite pneumothorax. 4. Extensive air in the subcutaneous fat of the anterior abdominal wall along with marked inflammation and edema. This also scattered small areas of free intraperitoneal air. It is possible is dissecting down from  a large pneumothorax. Has the patient had recent procedure? Necrotizing fasciitis is possible. 5. Distended bladder despite the Foley catheter. There is also mild hydroureter. 6. High attenuation material the gallbladder possibly due to vicarious excretion or milk of calcium   Electronically Signed   By: Loralie Champagne M.D.   On: 04/13/2013 17:31    Dg Chest Port 1 View  05/02/2013   CLINICAL DATA:  Evaluate possible pneumonitis  EXAM: PORTABLE CHEST - 1 VIEW  COMPARISON:  DG CHEST 1V PORT dated 05/01/2013  FINDINGS: Low lung volumes. Stable bilateral airspace opacities identified. No focal regions of consolidation. The cardiac silhouette and mediastinal contours are stable. Endotracheal tube is appreciated with tip at the level of the clavicles. A left chest wall dual chamber cardiac pacing unit is appreciated stable. An NG tube is identified tip not  on the view of this study. The osseous structures unremarkable.  IMPRESSION: Stable bilateral pulmonary opacities, with differential considerations as stated previously. . No new focal regions consolidation are new focal infiltrates. Support apparatus is also stable.   Electronically Signed   By: Salome Holmes M.D.   On: 05/02/2013 08:19    Physical Exam: BP 112/41  Pulse 70  Temp(Src) 95.5 F (35.3 C)  Resp 21  SpO2 100%  LMP 03/03/1985 Constitutional: chronically ill appearing female lying in ICU bed, NAD HEENT: Normocephalic and atraumatic. Conjunctivae are pale. No scleral icterus. Superficial lac bottom lip, dried blood with small clot in mouth, NGT in place Neck supple.  Cardiovascular: reg rate Pulmonary/chest: course b/l anteriorly Abdominal: Soft, mild distention, diffusely tender across mid and lower abd, hypoactive but present BS, multiple ecchymoses on ant abd  Rectal: flex-seal in place draining brown stool without visible blood or melena Extremities: cool extremities, trace pretib edema Neurological: moves all 4 extrem, follows commands Skin: pale, cool extremities, bruises as above  INR 2.89  Results for DALANIE, KISNER (MRN [id number]) as of 05/02/2013 18:49  Ref. Range 04/22/2013 04:15 04/22/2013 10:29 04/23/2013 05:40 04/26/2013 05:00 04/28/2013 03:05 04/30/2013 05:00 05/02/2013 05:00  Hemoglobin Latest Range: 12.0-15.0 g/dL 9.1 (L)  8.4 (L) 9.4 (L) 9.2 (L) 8.2 (L) 6.9 (LL)  HCT Latest Range: 36.0-46.0 % 28.7 (L)  26.8 (L) 29.8 (L) 29.5 (L) 25.8 (L) 21.7 (L)  MCV Latest Range: 78.0-100.0 fL 94.1  96.1 94.9 95.5 95.2 94.8  MCH Latest Range: 26.0-34.0 pg 29.8  30.1 29.9 29.8 30.3 30.1  MCHC Latest Range: 30.0-36.0 g/dL 40.9  81.1 91.4 78.2 95.6 31.8  RDW Latest Range: 11.5-15.5 % 15.7 (H)  15.8 (H) 15.7 (H) 16.3 (H) 16.6 (H) 17.9 (H)  Platelets Latest Range: 150-400 K/uL PLATELET CLUMPS NOTED ON SMEAR, UNABLE TO ESTIMATE PLATELET CLUMPS NOTED ON SMEAR, COUNT APPEARS ADEQUATE  PLATELET CLUMPS NOTED ON SMEAR, COUNT APPEARS ADEQUATE PLATELET CLUMPS NOTED ON SMEAR, COUNT APPEARS ADEQUATE PLATELET CLUMPING, SUGGEST RECOLLECTION OF SAMPLE IN CITRATE TUBE. 189 PLATELET CLUMPS NOTED ON SMEAR, COUNT APPEARS ADEQUATE   Results for TARISSA, KERIN (MRN [id number]) as of 05/02/2013 18:49  Ref. Range 04/29/2013 05:00 04/30/2013 05:00 05/01/2013 05:00 05/02/2013 05:00  Prothrombin Time Latest Range: 11.6-15.2 seconds 15.1 16.9 (H) 21.1 (H) 29.2 (H)  INR Latest Range: 0.00-1.49  1.22 1.41 1.89 (H) 2.89 (H)  APTT Latest Range: 24-37 seconds  39 (H)     CMP     Component Value Date/Time   NA 140 05/02/2013 0500   K 3.1* 05/02/2013 0500   CL 97 05/02/2013 0500   CO2 26 05/02/2013 0500  GLUCOSE 264* 05/02/2013 0500   BUN 54* 05/02/2013 0500   CREATININE 1.20* 05/02/2013 0500   CALCIUM 7.9* 05/02/2013 0500   PROT 5.6* 04/16/2013 0955   ALBUMIN 1.8* 04/30/2013 1041   AST 20 04/16/2013 0955   ALT 22 04/16/2013 0955   ALKPHOS 53 04/16/2013 0955   BILITOT 0.2* 04/16/2013 0955   GFRNONAA 42* 05/02/2013 0500   GFRAA 49* 05/02/2013 0500    NGT lavage with 120 cc saline -- return yellowish-green fluid without blood or clots   ASSESSMENT AND PLAN: 80 yo female with recent complicated medical history and prolonged hospitalization for Norvasc gastroenteritis complicated by respiratory failure/ARDS STATUS post tracheostomy placement, acute kidney injury, cardiac arrest in the setting of mucous plug, left pneumothorax status post decompression, DVT [place] on warfarin, severe deconditioning [facility] now readmitted to ICU with concern for hemorrhagic shock.   1.  Question of hemorrhagic shock -- at present there is no evidence for significant upper GI bleeding, NG lavage is negative for blood with bilious return and currently liquid stool is not melenic.  There is blood in her mouth which argues for oral source or possible hemoptysis given recent complicated respiratory  infections.   --Agree with reversal of warfarin effect given drop in hemoglobin --2u pRBC now, serial Hgb, additional transfusion as needed --PPI gtt for now --Defer EGD at this time given neg lavage and the need for resuscitation and reversal of anticoagulant effect --follow-up CT abd/pelvis as ordered by CCM team  2. Respiratory failure -- s/p trach, on vent, per CCM  3. AKI -- shock related, resuscitation as above  4.  DVT - warfarin on hold and being reversed in the setting of bleeding  Will follow

## 2013-05-02 NOTE — Telephone Encounter (Signed)
error 

## 2013-05-02 NOTE — Progress Notes (Signed)
Dr. Tyson Alias made aware of radiology CT results= retroperitoneal bleed. Currently transfusing PRBC's. Wil continue to monitor patient.  Corliss Skains RN

## 2013-05-02 NOTE — Progress Notes (Signed)
eLink Physician-Brief Progress Note Patient Name: [name] DOB: 03-Sep-1932 MRN: [id number]  Date of Service  05/02/2013   HPI/Events of Note   Ct confirmes retroper bleed  eICU Interventions  Await rpeat cpags after ffp, and cbc after prbc Repeat vit k  Ct reviewed Cbc q6h   Intervention Category Major Interventions: Hemorrhage - evaluation and management  Nelda Bucks. 05/02/2013, 9:41 PM

## 2013-05-02 NOTE — Progress Notes (Signed)
eLink Physician-Brief Progress Note Patient Name: JAENA MCENROE DOB: 07/04/1932 MRN: [id number]  Date of Service  05/02/2013   HPI/Events of Note   Await CT, no pressors ffp given Own last unit prbc  eICU Interventions  Add opcoags to follow cmet   Intervention Category Major Interventions: Electrolyte abnormality - evaluation and management  Nelda Bucks. 05/02/2013, 8:01 PM

## 2013-05-03 ENCOUNTER — Inpatient Hospital Stay (HOSPITAL_COMMUNITY): Payer: Medicare Other

## 2013-05-03 DIAGNOSIS — R58 Hemorrhage, not elsewhere classified: Principal | ICD-10-CM

## 2013-05-03 DIAGNOSIS — R7989 Other specified abnormal findings of blood chemistry: Secondary | ICD-10-CM

## 2013-05-03 DIAGNOSIS — D62 Acute posthemorrhagic anemia: Secondary | ICD-10-CM

## 2013-05-03 LAB — COMPREHENSIVE METABOLIC PANEL
ALBUMIN: 2.1 g/dL — AB (ref 3.5–5.2)
ALK PHOS: 63 U/L (ref 39–117)
ALT: 354 U/L — ABNORMAL HIGH (ref 0–35)
AST: 334 U/L — ABNORMAL HIGH (ref 0–37)
BUN: 65 mg/dL — ABNORMAL HIGH (ref 6–23)
CO2: 27 mEq/L (ref 19–32)
Calcium: 7.6 mg/dL — ABNORMAL LOW (ref 8.4–10.5)
Chloride: 104 mEq/L (ref 96–112)
Creatinine, Ser: 1.59 mg/dL — ABNORMAL HIGH (ref 0.50–1.10)
GFR calc non Af Amer: 30 mL/min — ABNORMAL LOW (ref >90)
GFR, EST AFRICAN AMERICAN: 35 mL/min — AB (ref >90)
GLUCOSE: 151 mg/dL — AB (ref 70–99)
POTASSIUM: 4.1 meq/L (ref 3.7–5.3)
Sodium: 144 mEq/L (ref 137–147)
Total Bilirubin: 1.2 mg/dL (ref 0.3–1.2)
Total Protein: 5 g/dL — ABNORMAL LOW (ref 6.0–8.3)

## 2013-05-03 LAB — CBC
HCT: 19.8 % — ABNORMAL LOW (ref 36.0–46.0)
HCT: 20.3 % — ABNORMAL LOW (ref 36.0–46.0)
HEMATOCRIT: 23.3 % — AB (ref 36.0–46.0)
HEMATOCRIT: 23.1 % — AB (ref 36.0–46.0)
HEMOGLOBIN: 6.5 g/dL — AB (ref 12.0–15.0)
Hemoglobin: 7.8 g/dL — ABNORMAL LOW (ref 12.0–15.0)
Hemoglobin: 8.1 g/dL — ABNORMAL LOW (ref 12.0–15.0)
Hemoglobin: 6.6 g/dL — CL (ref 12.0–15.0)
MCH: 30 pg (ref 26.0–34.0)
MCH: 31.5 pg (ref 26.0–34.0)
MCH: 29.8 pg (ref 26.0–34.0)
MCH: 29.6 pg (ref 26.0–34.0)
MCHC: 33.5 g/dL (ref 30.0–36.0)
MCHC: 35.1 g/dL (ref 30.0–36.0)
MCHC: 32.8 g/dL (ref 30.0–36.0)
MCHC: 32.5 g/dL (ref 30.0–36.0)
MCV: 89.6 fL (ref 78.0–100.0)
MCV: 89.9 fL (ref 78.0–100.0)
MCV: 90.8 fL (ref 78.0–100.0)
MCV: 91 fL (ref 78.0–100.0)
Platelets: ADEQUATE 10*3/uL (ref 150–400)
Platelets: UNDETERMINED 10*3/uL (ref 150–400)
RBC: 2.6 MIL/uL — AB (ref 3.87–5.11)
RBC: 2.57 MIL/uL — ABNORMAL LOW (ref 3.87–5.11)
RBC: 2.18 MIL/uL — ABNORMAL LOW (ref 3.87–5.11)
RBC: 2.23 MIL/uL — ABNORMAL LOW (ref 3.87–5.11)
RDW: 16.5 % — ABNORMAL HIGH (ref 11.5–15.5)
RDW: 18.6 % — ABNORMAL HIGH (ref 11.5–15.5)
RDW: 18.7 % — AB (ref 11.5–15.5)
RDW: 18.9 % — AB (ref 11.5–15.5)
WBC: 27.2 10*3/uL — AB (ref 4.0–10.5)
WBC: 31.1 10*3/uL — AB (ref 4.0–10.5)
WBC: 24.5 10*3/uL — ABNORMAL HIGH (ref 4.0–10.5)
WBC: 24.4 10*3/uL — ABNORMAL HIGH (ref 4.0–10.5)

## 2013-05-03 LAB — PREPARE FRESH FROZEN PLASMA: Unit division: 0

## 2013-05-03 LAB — URINALYSIS, ROUTINE W REFLEX MICROSCOPIC
Bilirubin Urine: NEGATIVE
Glucose, UA: NEGATIVE mg/dL
Ketones, ur: NEGATIVE mg/dL
NITRITE: NEGATIVE
PROTEIN: NEGATIVE mg/dL
Specific Gravity, Urine: 1.023 (ref 1.005–1.030)
UROBILINOGEN UA: 0.2 mg/dL (ref 0.0–1.0)
pH: 5 (ref 5.0–8.0)

## 2013-05-03 LAB — PROTIME-INR
INR: 1.63 — AB (ref 0.00–1.49)
INR: 1.05 (ref 0.00–1.49)
PROTHROMBIN TIME: 18.9 s — AB (ref 11.6–15.2)
Prothrombin Time: 13.5 seconds (ref 11.6–15.2)

## 2013-05-03 LAB — URINE MICROSCOPIC-ADD ON

## 2013-05-03 LAB — GLUCOSE, CAPILLARY
GLUCOSE-CAPILLARY: 130 mg/dL — AB (ref 70–99)
GLUCOSE-CAPILLARY: 138 mg/dL — AB (ref 70–99)
GLUCOSE-CAPILLARY: 123 mg/dL — AB (ref 70–99)
Glucose-Capillary: 124 mg/dL — ABNORMAL HIGH (ref 70–99)
Glucose-Capillary: 131 mg/dL — ABNORMAL HIGH (ref 70–99)
Glucose-Capillary: 167 mg/dL — ABNORMAL HIGH (ref 70–99)

## 2013-05-03 LAB — APTT: APTT: 40 s — AB (ref 24–37)

## 2013-05-03 LAB — PHOSPHORUS: Phosphorus: 5.3 mg/dL — ABNORMAL HIGH (ref 2.3–4.6)

## 2013-05-03 LAB — PROCALCITONIN: Procalcitonin: 1.27 ng/mL

## 2013-05-03 LAB — MAGNESIUM: Magnesium: 2.5 mg/dL (ref 1.5–2.5)

## 2013-05-03 LAB — PREPARE RBC (CROSSMATCH)

## 2013-05-03 LAB — LACTIC ACID, PLASMA: LACTIC ACID, VENOUS: 2.1 mmol/L (ref 0.5–2.2)

## 2013-05-03 MED ORDER — INSULIN ASPART 100 UNIT/ML ~~LOC~~ SOLN
0.0000 [IU] | SUBCUTANEOUS | Status: DC
  Administered 2013-05-03: 2 [IU] via SUBCUTANEOUS
  Administered 2013-05-03: 1 [IU] via SUBCUTANEOUS
  Administered 2013-05-03 – 2013-05-04 (×2): 2 [IU] via SUBCUTANEOUS
  Administered 2013-05-04: 3 [IU] via SUBCUTANEOUS
  Administered 2013-05-04 (×3): 2 [IU] via SUBCUTANEOUS
  Administered 2013-05-04 – 2013-05-05 (×5): 3 [IU] via SUBCUTANEOUS

## 2013-05-03 MED ORDER — SODIUM CHLORIDE 0.9 % IV BOLUS (SEPSIS)
1000.0000 mL | Freq: Once | INTRAVENOUS | Status: AC
  Administered 2013-05-03: 1000 mL via INTRAVENOUS

## 2013-05-03 MED ORDER — MORPHINE SULFATE 2 MG/ML IJ SOLN
2.0000 mg | INTRAMUSCULAR | Status: DC | PRN
  Administered 2013-05-03 – 2013-05-05 (×7): 2 mg via INTRAVENOUS
  Filled 2013-05-03 (×7): qty 1

## 2013-05-03 MED ORDER — PANTOPRAZOLE SODIUM 40 MG IV SOLR
40.0000 mg | Freq: Every day | INTRAVENOUS | Status: DC
  Administered 2013-05-03 – 2013-05-05 (×3): 40 mg via INTRAVENOUS
  Filled 2013-05-03 (×3): qty 40

## 2013-05-03 MED ORDER — PIPERACILLIN-TAZOBACTAM 3.375 G IVPB
3.3750 g | Freq: Three times a day (TID) | INTRAVENOUS | Status: DC
  Administered 2013-05-03 – 2013-05-05 (×7): 3.375 g via INTRAVENOUS
  Filled 2013-05-03 (×9): qty 50

## 2013-05-03 MED ORDER — SODIUM CHLORIDE 0.9 % IV SOLN
INTRAVENOUS | Status: DC
  Administered 2013-05-03 – 2013-05-04 (×2): via INTRAVENOUS

## 2013-05-03 MED ORDER — PANTOPRAZOLE SODIUM 40 MG IV SOLR
40.0000 mg | Freq: Two times a day (BID) | INTRAVENOUS | Status: DC
  Filled 2013-05-03: qty 40

## 2013-05-03 MED ORDER — CHLORHEXIDINE GLUCONATE 0.12 % MT SOLN
15.0000 mL | Freq: Two times a day (BID) | OROMUCOSAL | Status: DC
  Administered 2013-05-04 – 2013-05-05 (×4): 15 mL via OROMUCOSAL
  Filled 2013-05-03 (×4): qty 15

## 2013-05-03 MED ORDER — VANCOMYCIN HCL IN DEXTROSE 750-5 MG/150ML-% IV SOLN
750.0000 mg | INTRAVENOUS | Status: DC
  Administered 2013-05-03 – 2013-05-04 (×2): 750 mg via INTRAVENOUS
  Filled 2013-05-03 (×3): qty 150

## 2013-05-03 MED ORDER — CHLORHEXIDINE GLUCONATE 0.12 % MT SOLN
OROMUCOSAL | Status: AC
  Administered 2013-05-03: 15 mL
  Filled 2013-05-03: qty 15

## 2013-05-03 NOTE — Progress Notes (Signed)
eLink Physician-Brief Progress Note Patient Name: NIKKII FUERTES DOB: 02-17-1933 MRN: [id number]  Date of Service  05/03/2013   HPI/Events of Note   Hg 6.6  eICU Interventions  One unit given,.   Intervention Category Major Interventions: Other:  YACOUB,WESAM 05/03/2013, 8:58 PM

## 2013-05-03 NOTE — Progress Notes (Signed)
ANTIBIOTIC CONSULT NOTE - INITIAL  Pharmacy Consult for Vancomycin and Zosyn Indication: suspected PNA  Allergies  Allergen Reactions  . Beta Adrenergic Blockers Other (See Comments)    Not working well for pt.  Thayer Jew Hcl] Shortness Of Breath  . Carvedilol Shortness Of Breath    Chest  Discomfort.  . Dapsone Other (See Comments)    Kidney  failure  . Diclofenac Sodium Other (See Comments)    Causes  Fluid  Retention.  Chanda Busing [Pitavastatin Calcium] Shortness Of Breath  . Methotrexate Shortness Of Breath    Mouth sores.  . Metoprolol Succinate Hives  . Oxaprozin Diarrhea  . Other     Fragrance mixtures -  Cinol botanicals  &  Plants oil. Cocamidoproy   -   Betaine. Lipstick & other makeup & cleaning materials.   Marland Kitchen Epinephrine     Patient Measurements: Height: 5' 2.99" (160 cm) Weight: 175 lb 0.7 oz (79.4 kg) IBW/kg (Calculated) : 52.38  Vital Signs: Temp: 98 F (36.7 C) (03/03 1200) Temp src: Oral (03/03 1200) BP: 124/47 mmHg (03/03 1200) Pulse Rate: 72 (03/03 1200) Intake/Output from previous day: 03/02 0701 - 03/03 0700 In: 1108.3 [I.V.:388.3; Blood:670; IV Piggyback:50] Out: 1890 [ASTMH:9622; Stool:150] Intake/Output from this shift: Total I/O In: 200 [I.V.:200] Out: 58 [Urine:58]  Labs:  Recent Labs  05/01/13 0500 05/02/13 0500 05/03/13 0141 05/03/13 0200  WBC  --  30.7* 27.2*  --   HGB  --  6.9* 7.8*  --   PLT  --  PLATELET CLUMPS NOTED ON SMEAR, COUNT APPEARS ADEQUATE PLATELET CLUMPING, SUGGEST RECOLLECTION OF SAMPLE IN CITRATE TUBE.  --   CREATININE 0.88 1.20*  --  1.59*   Estimated Creatinine Clearance: 29.1 ml/min (by C-G formula based on Cr of 1.59). No results found for this basename: VANCOTROUGH, Corlis Leak, VANCORANDOM, [place], GENTPEAK, GENTRANDOM, TOBRATROUGH, TOBRAPEAK, TOBRARND, AMIKACINPEAK, AMIKACINTROU, AMIKACIN,  in the last 72 hours   Microbiology: Recent Results (from the past 720 hour(s))  CULTURE, BLOOD  (ROUTINE X 2)     Status: None   Collection Time    04/04/13 10:15 PM      Result Value Ref Range Status   Specimen Description BLOOD LEFT ANTECUBITAL   Final   Special Requests BOTTLES DRAWN AEROBIC ONLY 3CC   Final   Culture  Setup Time     Final   Value: 04/05/2013 01:11     Performed at [facility]   Culture     Final   Value: NO GROWTH 5 DAYS     Performed at [facility]   Report Status 04/11/2013 FINAL   Final  CULTURE, BLOOD (ROUTINE X 2)     Status: None   Collection Time    04/04/13 10:20 PM      Result Value Ref Range Status   Specimen Description BLOOD LEFT HAND   Final   Special Requests BOTTLES DRAWN AEROBIC ONLY 3CC   Final   Culture  Setup Time     Final   Value: 04/05/2013 01:12     Performed at [facility]   Culture     Final   Value: NO GROWTH 5 DAYS     Performed at [facility]   Report Status 04/11/2013 FINAL   Final  MRSA PCR SCREENING     Status: None   Collection Time    04/05/13  1:26 AM      Result Value Ref Range Status   MRSA  by PCR NEGATIVE  NEGATIVE Final   Comment:            The GeneXpert MRSA Assay (FDA     approved for NASAL specimens     only), is one component of a     comprehensive MRSA colonization     surveillance program. It is not     intended to diagnose MRSA     infection nor to guide or     monitor treatment for     MRSA infections.  CULTURE, EXPECTORATED SPUTUM-ASSESSMENT     Status: None   Collection Time    04/05/13  6:34 AM      Result Value Ref Range Status   Specimen Description SPUTUM   Final   Special Requests Normal   Final   Sputum evaluation     Final   Value: THIS SPECIMEN IS ACCEPTABLE. RESPIRATORY CULTURE REPORT TO FOLLOW.   Report Status 04/05/2013 FINAL   Final  CULTURE, RESPIRATORY (NON-EXPECTORATED)     Status: None   Collection Time    04/05/13  6:34 AM      Result Value Ref Range Status   Specimen Description SPUTUM   Final   Special Requests NONE   Final   Gram  Stain     Final   Value: FEW WBC PRESENT,BOTH PMN AND MONONUCLEAR     RARE SQUAMOUS EPITHELIAL CELLS PRESENT     MODERATE GRAM POSITIVE COCCI     IN PAIRS RARE GRAM NEGATIVE RODS     Performed at [facility]   Culture     Final   Value: NORMAL OROPHARYNGEAL FLORA     Performed at [facility]   Report Status 04/07/2013 FINAL   Final  RESPIRATORY VIRUS PANEL     Status: None   Collection Time    04/05/13 11:00 AM      Result Value Ref Range Status   Source - RVPAN NASAL SWAB   Corrected   Comment: CORRECTED ON 02/04 AT 1911: PREVIOUSLY REPORTED AS NASAL SWAB   Respiratory Syncytial Virus A NOT DETECTED   Final   Respiratory Syncytial Virus B NOT DETECTED   Final   Influenza A NOT DETECTED   Final   Influenza B NOT DETECTED   Final   Parainfluenza 1 NOT DETECTED   Final   Parainfluenza 2 NOT DETECTED   Final   Parainfluenza 3 NOT DETECTED   Final   Metapneumovirus NOT DETECTED   Final   Rhinovirus NOT DETECTED   Final   Adenovirus NOT DETECTED   Final   Influenza A H1 NOT DETECTED   Final   Influenza A H3 NOT DETECTED   Final   Comment: (NOTE)           Normal Reference Range for each Analyte: NOT DETECTED     Testing performed using the Luminex xTAG Respiratory Viral Panel test     kit.     This test was developed and its performance characteristics determined     by [facility]. It has not been cleared or approved by the [place]     Food and Drug Administration. This test is used for clinical purposes.     It should not be regarded as investigational or for research. This     laboratory is certified under the [place] (CLIA) as qualified to perform high complexity     clinical laboratory testing.  Performed at [place], RESPIRATORY (NON-EXPECTORATED)     Status: None   Collection Time    04/06/13  1:30 PM      Result Value Ref Range Status   Specimen Description TRACHEAL ASPIRATE    Final   Special Requests Normal   Final   Gram Stain     Final   Value: RARE WBC PRESENT, PREDOMINANTLY PMN     NO SQUAMOUS EPITHELIAL CELLS SEEN     NO ORGANISMS SEEN     Performed at [facility]   Culture     Final   Value: NO GROWTH 2 DAYS     Performed at [facility]   Report Status 04/08/2013 FINAL   Final  CULTURE, RESPIRATORY (NON-EXPECTORATED)     Status: None   Collection Time    04/08/13  4:55 PM      Result Value Ref Range Status   Specimen Description SPUTUM   Final   Special Requests NONE   Final   Gram Stain     Final   Value: MODERATE WBC PRESENT,BOTH PMN AND MONONUCLEAR     NO SQUAMOUS EPITHELIAL CELLS SEEN     NO ORGANISMS SEEN     Performed at [facility]   Culture     Final   Value: NO GROWTH 2 DAYS     Performed at [facility]   Report Status 04/11/2013 FINAL   Final  CULTURE, BLOOD (ROUTINE X 2)     Status: None   Collection Time    04/13/13  9:38 AM      Result Value Ref Range Status   Specimen Description BLOOD RIGHT ARM   Final   Special Requests BOTTLES DRAWN AEROBIC AND ANAEROBIC 5CC   Final   Culture  Setup Time     Final   Value: 04/13/2013 12:36     Performed at [facility]   Culture     Final   Value: NO GROWTH 5 DAYS     Performed at [facility]   Report Status 04/19/2013 FINAL   Final  URINE CULTURE     Status: None   Collection Time    04/13/13  9:40 AM      Result Value Ref Range Status   Specimen Description URINE, CATHETERIZED   Final   Special Requests NONE   Final   Culture  Setup Time     Final   Value: 04/13/2013 12:47     Performed at [place]     Final   Value: >=100,000 COLONIES/ML     Performed at [facility]   Culture     Final   Value: YEAST     Performed at [facility]   Report Status 04/14/2013 FINAL   Final  CULTURE, RESPIRATORY (NON-EXPECTORATED)     Status: None   Collection Time    04/13/13 11:13 AM      Result  Value Ref Range Status   Specimen Description TRACHEAL ASPIRATE   Final   Special Requests NONE   Final   Gram Stain     Final   Value: MODERATE WBC PRESENT, PREDOMINANTLY PMN     RARE SQUAMOUS EPITHELIAL CELLS PRESENT     NO ORGANISMS SEEN     Performed at [facility]   Culture     Final   Value: [place]     Performed  at [facility]   Report Status 04/15/2013 FINAL   Final  OVA AND PARASITE EXAMINATION     Status: None   Collection Time    04/17/13  6:00 AM      Result Value Ref Range Status   Specimen Description STOOL   Final   Special Requests NONE   Final   Ova and parasites     Final   Value: NO OVA OR PARASITES SEEN     Performed at [facility]   Report Status 04/19/2013 FINAL   Final  CLOSTRIDIUM DIFFICILE BY PCR     Status: None   Collection Time    04/18/13  9:10 AM      Result Value Ref Range Status   C difficile by pcr NEGATIVE  NEGATIVE Final   Comment: Performed at [place] DIFFICILE BY PCR     Status: None   Collection Time    05/02/13  4:00 PM      Result Value Ref Range Status   C difficile by pcr NEGATIVE  NEGATIVE Final  MRSA PCR SCREENING     Status: None   Collection Time    05/02/13  6:02 PM      Result Value Ref Range Status   MRSA by PCR NEGATIVE  NEGATIVE Final   Comment:            The GeneXpert MRSA Assay (FDA     approved for NASAL specimens     only), is one component of a     comprehensive MRSA colonization     surveillance program. It is not     intended to diagnose MRSA     infection nor to guide or     monitor treatment for     MRSA infections.   Assessment: 80 yo female with recent complicated ICU stay at [facility] from gastroenteritis with ARDS, VDRF, cardiac arrest, PTX s/p trach. She was transferred to [facility] 2/20 for further rehab and wean from vent. She was found to have bleeding from her mouth, hypotension, abd pain on 3/2 and transferred to [facility] ICU. She was on  anti-coagulation for recent Dx of LUE DVT from PICC line. Pt to start broad spectum antibiotics (Day #1) for possible HCAP. Cultures pending. Est CrCl 30 ml/min.  Goal of Therapy:  Vancomycin trough level 15-20 mcg/ml  Plan:  1. Zosyn 3.375gm IV q8h - each dose over 4 hours 2. Vancomycin 737m IV q24h. 3. Will f/u micro data, renal function, pt's clinical condition, trough prn  CSherlon Handing PharmD, BCPS Clinical pharmacist, pager [phone]/05/2013,2:48 PM

## 2013-05-03 NOTE — Progress Notes (Addendum)
Patient admitted to unit with foley catheter used at [facility] dated February 3, urine sample collected and sent to lab. Old foley removed and orders to reinsert new foley per Dr. Darrick Penna.  Corliss Skains RN

## 2013-05-03 NOTE — Progress Notes (Signed)
INITIAL NUTRITION ASSESSMENT  DOCUMENTATION CODES Per approved criteria  -Not Applicable   INTERVENTION: - If patient unable to tolerate POs in 24 hours, consider initiate TF via NGT utilizing 2M PEPup Protocol. initiate Vital 1.5 at 25 ml/hr on day 1; on day 2, increase to goal rate of 40 ml per hour (960 ml) with 30 ml Prostat daily to provide 1540 kcal, 80 g protein, 733 ml free water.  - Recommend SLP evaluation when able to evaluate appropriate diet.   NUTRITION DIAGNOSIS: Inadequate oral intake related to inability to eat as evidenced by NPO status.  Goal: Patient will meet >/=90% of estimated nutrition needs  Monitor:  Vent status, initiation of TF or diet, weight, labs  Reason for Assessment: VDRF  80 y.o. female  Admitting Dx: Hemorrhagic shock  ASSESSMENT: Patient with recent admission 2/2 with norovirus gastroenteritis, respiratory failure, septic shock. Course complicated by ARDS, AKI, and cardiac arrest. Patient required placement of tracheostomy and tube feeding. Discharged to [facility]. Patient readmitted with hemorrhagic shock. No evidence of upper GI bleeding.   Unable to obtain history from patient. She does have and NG tube in place, clamped at this time. She is currently not receiving nutrition support. Previously, she was receiving Oxepa with 30 ml Prostat TID. However, Oxepa may not be indicated for current condition.   During previous admission, patient with   Height: Ht Readings from Last 1 Encounters:  05/03/13 5' 2.99" (1.6 m)    Weight: Wt Readings from Last 1 Encounters:  05/03/13 175 lb 0.7 oz (79.4 kg)  During previous admission, patient's weight up 31 pounds related to fluid. Likely not back to baseline.   Ideal Body Weight: 115 pounds  % Ideal Body Weight: 152%  Wt Readings from Last 10 Encounters:  05/03/13 175 lb 0.7 oz (79.4 kg)  04/22/13 192 lb 7.4 oz (87.3 kg)  11/04/12 161 lb 12.8 oz (73.392 kg)  04/10/12 154 lb (69.854 kg)   08/27/11 166 lb 6.4 oz (75.479 kg)  03/05/11 169 lb (76.658 kg)  02/11/11 168 lb 3.2 oz (76.295 kg)  05/23/09 169 lb (76.658 kg)  04/25/09 169 lb (76.658 kg)  03/23/09 174 lb (78.926 kg)    Usual Body Weight: 160-165 pounds  % Usual Body Weight: 106-109%  BMI:  Body mass index is 31.02 kg/(m^2). Patient is obese, class 1, but BMI likely lower due to fluid accumulation.   Estimated Nutritional Needs: Kcal: 1571 kcal Protein: 80-95 g Fluid: >2.2 L/day  Skin: L>R BUE edema, skin tear, maceration right femoral  Diet Order: NPO  EDUCATION NEEDS: -No education needs identified at this time   Intake/Output Summary (Last 24 hours) at 05/03/13 1230 Last data filed at 05/03/13 1200  Gross per 24 hour  Intake 1308.33 ml  Output   1948 ml  Net -639.67 ml    Last BM: PTA   Labs:   Recent Labs Lab 05/01/13 0500 05/02/13 0500 05/03/13 0200  NA 145 140 144  K 3.3* 3.1* 4.1  CL 103 97 104  CO2 30 26 27   BUN 38* 54* 65*  CREATININE 0.88 1.20* 1.59*  CALCIUM 7.8* 7.9* 7.6*  MG  --   --  2.5  PHOS  --   --  5.3*  GLUCOSE 166* 264* 151*    CBG (last 3)   Recent Labs  05/03/13 0022 05/03/13 0346 05/03/13 0737  GLUCAP 167* 130* 131*    Scheduled Meds: . chlorhexidine  15 mL Mouth/Throat BID  . insulin  aspart  0-15 Units Subcutaneous 6 times per day  . levothyroxine  37.5 mcg Intravenous Daily  . pantoprazole (PROTONIX) IV  40 mg Intravenous Q12H    Continuous Infusions:   Past Medical History  Diagnosis Date  . Hypothyroid   . Complete heart block 2002    Initial PPM 2002 with upgrade by JA to CRT-P 11/21/08  . Nonischemic cardiomyopathy   . Persistent atrial fibrillation   . Hypertension   . Hyperlipidemia   . Hives 4/10  . Cataract 4/13    right  . Renal failure     3rd stage-seeing neurologist    Past Surgical History  Procedure Laterality Date  . Pacemaker insertion  2002, 2010    PPM 2002, upgrade to CRT-P by Dr Johney FrameAllred (MDT 2010)  .  Partial colectomy  1/04  . Abdominal hysterectomy  1987    TAH  . Bunionectomy Right 1996  . Breast biopsy Left 01/1997  . Cataract extraction  4/13    both eyes  . Cyst excision      on back of left ear    Linnell FullingElyse Shiraz Bastyr, RD, LDN Pager #: [phone] After-Hours Pager #: [phone]

## 2013-05-03 NOTE — Progress Notes (Signed)
Progress Note   Subjective  eyes open. Indicates she has abdominal discomfort.    Objective   Vital signs in last 24 hours: Temp:  [94.8 F (34.9 C)-98.7 F (37.1 C)] 98.3 F (36.8 C) (03/03 0900) Pulse Rate:  [69-82] 70 (03/03 0900) Resp:  [19-31] 24 (03/03 0900) BP: (94-142)/(34-78) 119/37 mmHg (03/03 0900) SpO2:  [99 %-100 %] 99 % (03/03 0900) FiO2 (%):  [40 %-100 %] 40 % (03/03 0900) Weight:  [175 lb 0.7 oz (79.4 kg)] 175 lb 0.7 oz (79.4 kg) (03/03 0738) Last BM Date: 05/02/13 General:    white female in NAD HEENT: NGT clamped, +trach, on vent. Heart:  Regular rate and rhythm Lungs: Respirations even and unlabored Abdomen:  Soft, mildly distended, not particularly tender, diffuse ecchymosis.  Normal bowel sounds. Rectal: flexiseal with light brown loose stool Neurologic:  Alert   Lab Results:  Recent Labs  05/02/13 0500 05/03/13 0141  WBC 30.7* 27.2*  HGB 6.9* 7.8*  HCT 21.7* 23.3*  PLT PLATELET CLUMPS NOTED ON SMEAR, COUNT APPEARS ADEQUATE PLATELET CLUMPING, SUGGEST RECOLLECTION OF SAMPLE IN CITRATE TUBE.   BMET  Recent Labs  05/01/13 0500 05/02/13 0500 05/03/13 0200  NA 145 140 144  K 3.3* 3.1* 4.1  CL 103 97 104  CO2 30 26 27   GLUCOSE 166* 264* 151*  BUN 38* 54* 65*  CREATININE 0.88 1.20* 1.59*  CALCIUM 7.8* 7.9* 7.6*   LFT  Recent Labs  05/03/13 0200  PROT 5.0*  ALBUMIN 2.1*  AST 334*  ALT 354*  ALKPHOS 63  BILITOT 1.2   PT/INR  Recent Labs  05/02/13 0500 05/03/13 0141  LABPROT 29.2* 18.9*  INR 2.89* 1.63*    Studies/Results: Ct Abdomen Pelvis Wo Contrast  05/02/2013   CLINICAL DATA:  Abdominal pain.  Rule out hemorrhage  EXAM: CT ABDOMEN AND PELVIS WITHOUT CONTRAST  TECHNIQUE: Multidetector CT imaging of the abdomen and pelvis was performed following the standard protocol without intravenous contrast.  COMPARISON:  DG ABD PORTABLE 1V dated 05/02/2013  FINDINGS: The patient has sustained an acute hemorrhage within the substance  of the iliopsoas muscle on the left. This hemorrhage measures approximately 6.1 cm AP x 6.1 cm transversely at the level of the left iliac crest and extends from the proximal 1/3 of the psoas muscle inferiorly to the proximal left hip. Some blood has tracked outside of the musculature into the left paracolic region and extends into the pelvis and lies just lateral to the left aspect of the urinary bladder. The right iliopsoas muscle is normal in appearance.  The liver exhibits no evidence of parenchymal or subcapsular hemorrhage. There is no focal mass. The gallbladder is adequately distended. Radiodense bile layers in the dependent portion of the gallbladder. The pancreas, right adrenal gland, and kidneys exhibit no acute abnormalities. There is a left adrenal mass which measures 2.3 x 2 cm unchanged since the previous study. This stomach is moderately distended with gas and some fluid. There is a nasogastric tube present with the tip coiled in the gastric cardia. The spleen is small and exhibits no acute abnormality. The caliber of the abdominal aorta is normal. There is no periaortic hemorrhage or lymphadenopathy.  The unopacified loops of small and large bowel exhibit no evidence of ileus nor of obstruction. The urinary bladder is distended despite the presence of a Foley catheter. The uterus appears surgically absent. There are cystic adnexal processes bilaterally. There is no free fluid in the pelvis.  There is  increased density within the subcutaneous and deep soft tissues of the flanks especially on the left consistent with edema or ecchymosis. The lumbar vertebral bodies are preserved in height. There are degenerative disc changes at L3-4 and at L4-5. The bony pelvis exhibits no acute abnormality.  The lung bases exhibit interval clearing of the large right pleural effusion. There remain coarse increased interstitial markings in both lungs and confluent alveolar infiltrate medially in the right lower lobe.   IMPRESSION: 1. There is a moderate-sized retroperitoneal hemorrhage into the left psoas and iliacus muscle. A small amount of fluid tracts into the left paracolic gutter. There is also increased density in the subcutaneous tissues over the flanks consistent with edema or ecchymosis. 2. There is no evidence of hemorrhage into the visceral organs. 3. There is a stable left adrenal mass. 4. The urinary bladder is quite distended despite the presence of a Foley catheter. Assessment of the functional status of the catheter is needed. 5. There are cystic adnexal processes bilaterally unchanged from the previous study. There has been interval clearing of the right pleural effusion. A right lower lobe infiltrate persists and there is bilateral lower lobe atelectasis or fibrosis. 6. These results were called by telephone at the time of interpretation on 05/02/2013 at 9:36 PM to Corliss Skains, RN,, who verbally acknowledged these results.   Electronically Signed   By: David  [place]   On: 05/02/2013 21:37     Assessment / Plan:   1. Retroperitoneal bleed on coumadin (therapeutic INR level of 2.89). INR reversed, down to 1.63 today.  2. Acute anemia, hgb down from low 9 range to 6.9 yesterday. She received 3 units of blood yesterday, hgb this am up less than a gram to 7.8. Anemia likely all secondary to #1. No evidence to GI bleed, will change d/c PPI drip but continue IV PPI for prophylaxis.  3. Recent prolonged hospitalization for gastroenteritis complicated by respiratory failure requiring trach, AKI and cardiac arrest. Transferred to [facility] but readmitted with with shock (?hemorrhagic)  4. AKI.  5. DVT, on coumadin at home.    6. Transaminitis. AST and  ALT in mid 300 range. Transaminase were normal on 04/16/13. Suspect LFT abnormalities multifactorial (medications, possibly ischemic as she was hypotensive prior to admission)    LOS: 1 day   Willette Cluster  05/03/2013, 9:25 AM   GI  ATTENDING  History, laboratories, x-rays reviewed. Case discussed with Dr. Rhea Belton. Bleeding with hemodynamic compromise found to be secondary to retroperitoneal bleed and not GI bleed. Management per critical care medicine. Agree with above regarding transaminase elevations. Shock and medications most likely an acute setting. Would not work up further at this time. Supportive care.Verlon Au available as needed. Will sign off.  Wilhemina Bonito. Eda Keys., M.D. [facility] Division of Gastroenterology

## 2013-05-03 NOTE — Progress Notes (Signed)
CRITICAL VALUE ALERT  Critical value received:  Hgb= 6.5, recheck hgb=6.6 Date of notification:  05-03-13  Time of notification:  2045  Critical value read back:yes  Nurse who received alert:  Corliss Skains RN  MD notified (1st page):  Dr. Molli Knock  Time of first page:  2145  MD notified (2nd page):  Time of second page:  Responding MD:  Dr. Molli Knock  Time MD responded:  2145

## 2013-05-03 NOTE — Progress Notes (Signed)
Inpatient Diabetes Program Recommendations  AACE/ADA: New Consensus Statement on Inpatient Glycemic Control (2013)  Target Ranges:  Prepandial:   less than 140 mg/dL      Peak postprandial:   less than 180 mg/dL (1-2 hours)      Critically ill patients:  140 - 180 mg/dL   Reason for Visit: Results for [name], [name] (MRN [id number]) as of 05/03/2013 10:21  Ref. Range 05/02/2013 16:35 05/02/2013 18:47 05/03/2013 00:22 05/03/2013 03:46 05/03/2013 07:37  Glucose-Capillary Latest Range: 70-99 mg/dL 657 (H) 903 (H) 833 (H) 130 (H) 131 (H)    Diabetes history: None noted Outpatient Diabetes medications [facility]): Novolog correction Current orders for Inpatient glycemic control: None     Note: Consider restarting Novolog sensitive correction q 4 hours.    Thanks, Beryl Meager, RN, BC-ADM Inpatient Diabetes Coordinator Pager [phone]

## 2013-05-03 NOTE — H&P (Addendum)
PULMONARY / CRITICAL CARE MEDICINE  Name: [name] MRN: [id number] DOB: [birth year]    ADMISSION DATE:  05/02/2013 CONSULTATION DATE:  05/02/2013  REFERRING MD :  [facility] PRIMARY SERVICE: PCCM  CHIEF COMPLAINT:  Shock  BRIEF PATIENT DESCRIPTION: 80 yo with left subclavian and axillary vein thrombosis documented 2/24 on anticoagulation transferred from [facility] on 3/2 in hemorrhagic shock and acute blood loss anemia.  SIGNIFICANT EVENTS / STUDIES:   3/2  Given Vit K, FFP x 1, PRBC x 2 3/2  Seen by GI >>> No indication for EGD at this time 3/2  CT abdomen / pelvis >>> MODERATE RETROPERITONEAL HEMORRHAGE L psoas / iliacus muscle, adrenal mass, stable cystic adnexal changes  LINES / TUBES: Trach 2/18 >>> LUE PICC 2/20 >>>  CULTURES: 3/2  Clostridium difficile PCR >>> neg 3/3  Blood >>> 3/3  Sputum >>> 3/3  Urine >>>  ANTIBIOTICS: Vancomycin 3/3 >>> Zosyn 3/3 >>>  INTERVAL HISTORY:  Hemodynamically stable overnight per RN  VITAL SIGNS: Temp:  [94.8 F (34.9 C)-98.7 F (37.1 C)] 98 F (36.7 C) (03/03 1200) Pulse Rate:  [69-82] 70 (03/03 0900) Resp:  [19-31] 24 (03/03 0900) BP: (94-142)/(34-78) 119/37 mmHg (03/03 0900) SpO2:  [99 %-100 %] 99 % (03/03 0900) FiO2 (%):  [40 %-100 %] 40 % (03/03 0900) Weight:  [79.4 kg (175 lb 0.7 oz)] 79.4 kg (175 lb 0.7 oz) (03/03 0738)  HEMODYNAMICS:   VENTILATOR SETTINGS: Vent Mode:  [-] PRVC FiO2 (%):  [40 %-100 %] 40 % Set Rate:  [22 bmp] 22 bmp Vt Set:  [500 mL] 500 mL PEEP:  [5 cmH20] 5 cmH20 Plateau Pressure:  [11 cmH20-24 cmH20] 22 cmH20  INTAKE / OUTPUT: Intake/Output     03/02 0701 - 03/03 0700 03/03 0701 - 03/04 0700   I.V. (mL/kg) 388.3 90 (1.1)   Blood 670    IV Piggyback 50    Total Intake(mL/kg) 1108.3 90 (1.1)   Urine (mL/kg/hr) 1740 58 (0.1)   Stool 150    Total Output 1890 58   Net -781.7 +32          PHYSICAL EXAMINATION: General: Appears chronically ill, no respiratory distress Neuro: Awake, alert,  attempting to communicate HEENT: Tracheostomy site intact Cardiovascular: RRR, no murmurs Lungs: Scattered rhonchi Abdomen: Soft, non tender, bowel sounds present Musculoskeletal: Anasarca, ecchymoses upper extremities bilaterally, L>R BUE edema  LABS:  CBC  Recent Labs Lab 04/30/13 0500 05/02/13 0500 05/03/13 0141  WBC 17.6* 30.7* 27.2*  HGB 8.2* 6.9* 7.8*  HCT 25.8* 21.7* 23.3*  PLT 189 PLATELET CLUMPS NOTED ON SMEAR, COUNT APPEARS ADEQUATE PLATELET CLUMPING, SUGGEST RECOLLECTION OF SAMPLE IN CITRATE TUBE.   Coag's  Recent Labs Lab 04/30/13 0500 05/01/13 0500 05/02/13 0500 05/03/13 0141  APTT 39*  --   --  40*  INR 1.41 1.89* 2.89* 1.63*   BMET  Recent Labs Lab 05/01/13 0500 05/02/13 0500 05/03/13 0200  NA 145 140 144  K 3.3* 3.1* 4.1  CL 103 97 104  CO2 30 26 27   BUN 38* 54* 65*  CREATININE 0.88 1.20* 1.59*  GLUCOSE 166* 264* 151*   Electrolytes  Recent Labs Lab 05/01/13 0500 05/02/13 0500 05/03/13 0200  CALCIUM 7.8* 7.9* 7.6*  MG  --   --  2.5  PHOS  --   --  5.3*   Sepsis Markers  Recent Labs Lab 05/03/13 0140  LATICACIDVEN 2.1   ABG  Recent Labs Lab 04/28/13 0500 04/28/13 1730 05/02/13 1420  PHART  7.493* 7.503* 7.401  PCO2ART 40.1 37.7 27.4*  PO2ART 206.0* 75.0* 383.0*   Liver Enzymes  Recent Labs Lab 04/30/13 1041 05/03/13 0200  AST  --  334*  ALT  --  354*  ALKPHOS  --  63  BILITOT  --  1.2  ALBUMIN 1.8* 2.1*   Cardiac Enzymes No results found for this basename: TROPONINI, PROBNP,  in the last 168 hours  Glucose  Recent Labs Lab 05/02/13 1635 05/02/13 1847 05/03/13 0022 05/03/13 0346 05/03/13 0737 05/03/13 1156  GLUCAP 250* 204* 167* 130* 131* 138*   Imaging Ct Abdomen Pelvis Wo Contrast  05/02/2013   CLINICAL DATA:  Abdominal pain.  Rule out hemorrhage  EXAM: CT ABDOMEN AND PELVIS WITHOUT CONTRAST  TECHNIQUE: Multidetector CT imaging of the abdomen and pelvis was performed following the standard protocol  without intravenous contrast.  COMPARISON:  DG ABD PORTABLE 1V dated 05/02/2013  FINDINGS: The patient has sustained an acute hemorrhage within the substance of the iliopsoas muscle on the left. This hemorrhage measures approximately 6.1 cm AP x 6.1 cm transversely at the level of the left iliac crest and extends from the proximal 1/3 of the psoas muscle inferiorly to the proximal left hip. Some blood has tracked outside of the musculature into the left paracolic region and extends into the pelvis and lies just lateral to the left aspect of the urinary bladder. The right iliopsoas muscle is normal in appearance.  The liver exhibits no evidence of parenchymal or subcapsular hemorrhage. There is no focal mass. The gallbladder is adequately distended. Radiodense bile layers in the dependent portion of the gallbladder. The pancreas, right adrenal gland, and kidneys exhibit no acute abnormalities. There is a left adrenal mass which measures 2.3 x 2 cm unchanged since the previous study. This stomach is moderately distended with gas and some fluid. There is a nasogastric tube present with the tip coiled in the gastric cardia. The spleen is small and exhibits no acute abnormality. The caliber of the abdominal aorta is normal. There is no periaortic hemorrhage or lymphadenopathy.  The unopacified loops of small and large bowel exhibit no evidence of ileus nor of obstruction. The urinary bladder is distended despite the presence of a Foley catheter. The uterus appears surgically absent. There are cystic adnexal processes bilaterally. There is no free fluid in the pelvis.  There is increased density within the subcutaneous and deep soft tissues of the flanks especially on the left consistent with edema or ecchymosis. The lumbar vertebral bodies are preserved in height. There are degenerative disc changes at L3-4 and at L4-5. The bony pelvis exhibits no acute abnormality.  The lung bases exhibit interval clearing of the large  right pleural effusion. There remain coarse increased interstitial markings in both lungs and confluent alveolar infiltrate medially in the right lower lobe.  IMPRESSION: 1. There is a moderate-sized retroperitoneal hemorrhage into the left psoas and iliacus muscle. A small amount of fluid tracts into the left paracolic gutter. There is also increased density in the subcutaneous tissues over the flanks consistent with edema or ecchymosis. 2. There is no evidence of hemorrhage into the visceral organs. 3. There is a stable left adrenal mass. 4. The urinary bladder is quite distended despite the presence of a Foley catheter. Assessment of the functional status of the catheter is needed. 5. There are cystic adnexal processes bilaterally unchanged from the previous study. There has been interval clearing of the right pleural effusion. A right lower lobe infiltrate persists and  there is bilateral lower lobe atelectasis or fibrosis. 6. These results were called by telephone at the time of interpretation on 05/02/2013 at 9:36 PM to Corliss Skains, RN,, who verbally acknowledged these results.   Electronically Signed   By: David  [place]   On: 05/02/2013 21:37   Dg Chest Port 1 View  05/03/2013   CLINICAL DATA:  ARDS  EXAM: PORTABLE CHEST - 1 VIEW  COMPARISON:  05/02/2013  FINDINGS: Progression of left upper lobe and left lower lobe infiltrate, worrisome for pneumonia. Improved aeration in the right lung which is now clear.  Small left effusion  Tracheostomy in good position.  NG tube tip in the stomach.  IMPRESSION: Progression of left upper lobe and left lower lobe infiltrate, concerning for pneumonia. Small left effusion.   Electronically Signed   By: Marlan Palau M.D.   On: 05/03/2013 07:40   Dg Chest Port 1 View  05/02/2013   CLINICAL DATA:  Evaluate possible pneumonitis  EXAM: PORTABLE CHEST - 1 VIEW  COMPARISON:  DG CHEST 1V PORT dated 05/01/2013  FINDINGS: Low lung volumes. Stable bilateral airspace opacities  identified. No focal regions of consolidation. The cardiac silhouette and mediastinal contours are stable. Endotracheal tube is appreciated with tip at the level of the clavicles. A left chest wall dual chamber cardiac pacing unit is appreciated stable. An NG tube is identified tip not on the view of this study. The osseous structures unremarkable.  IMPRESSION: Stable bilateral pulmonary opacities, with differential considerations as stated previously. . No new focal regions consolidation are new focal infiltrates. Support apparatus is also stable.   Electronically Signed   By: Salome Holmes M.D.   On: 05/02/2013 08:19   Dg Abd Portable 1v  05/02/2013   CLINICAL DATA:  Nasogastric tube placement.  EXAM: PORTABLE ABDOMEN - 1 VIEW  COMPARISON:  04/22/2013.  FINDINGS: The distal end of the nasogastric tube is seen coiled in the proximal stomach. Gas is noted throughout the colon. No pathologic distention of small bowel. No gross evidence of pneumoperitoneum.  IMPRESSION: 1. Tip and side-port of nasogastric tube are within the proximal stomach.   Electronically Signed   By: Trudie Reed M.D.   On: 05/02/2013 14:34    ASSESSMENT / PLAN:  PULMONARY A: Acute on chronic respiratory failure Post ARDS pulmonary fibrosis Possible HCAP Tracheostomy status Chronic ventilatory support P:    Goal pH>7.30, SpO2>92 Continuous mechanical support VAP bundle ABG/CXR PRN  CARDIOVASCULAR A:  Hemorrhagic shock, lactic acid reassuring DVT L subclavian / axillary vein Chronic diastolic congestive heart failure, HTN, AF P:  Goal MAP > 65 Place R PICC Remove L PICC after new one is placed  RENAL A:   AKI Distended bladder in spit of foley noted on CT  P:   Trend BMP NS 1000 x 1 NS@100  Foley changed  GASTROINTESTINAL A:   Suspected GI hemorrhage, but unlikely given finding of retroperitoneal hemorrhage  Elevated transaminases, shocked liver? Protein calorie malnutrition Diarrhea  GI Px P:    Appreciate GI input, no indication for EGD at this time Change Protonix to daily Restart TF Trend LFTs  HEMATOLOGIC A:   DVT L subclavian / axillary vein Retroperitoneal hemorrhage Acute blood loss anemia Coumadin induced coagulopathy Thrombocytopenia P:  Goal Hb >=7 Trend CBC q6h ( first now ) Recheck INR now Send platelets in citrated   INFECTIOUS A:   Rising WBC, no overt source of infection Possible HCAP C.dif ruled out P:   Start Vancomycin /  Zosyn Panculture PCT  ENDOCRINE A:  Hypothyroidism Hyperglycemia P:   Synthroid SSI  NEUROLOGIC A: Pain P:   Morphine PRN  I have personally obtained history, examined patient, evaluated and interpreted laboratory and imaging results, reviewed medical records, formulated assessment / plan and placed orders.  CRITICAL CARE:  The patient is critically ill with multiple organ systems failure and requires high complexity decision making for assessment and support, frequent evaluation and titration of therapies, application of advanced monitoring technologies and extensive interpretation of multiple databases. Critical Care Time devoted to patient care services described in this note is 35 minutes.   Lonia Farber, MD Pulmonary and Critical Care Medicine [facility] Pager: [phone]  05/03/2013, 12:21 PM

## 2013-05-04 ENCOUNTER — Inpatient Hospital Stay (HOSPITAL_COMMUNITY): Payer: Medicare Other

## 2013-05-04 LAB — TYPE AND SCREEN
ABO/RH(D): A POS
Antibody Screen: NEGATIVE
UNIT DIVISION: 0
UNIT DIVISION: 0
Unit division: 0

## 2013-05-04 LAB — PHOSPHORUS: Phosphorus: 2.9 mg/dL (ref 2.3–4.6)

## 2013-05-04 LAB — BASIC METABOLIC PANEL
BUN: 56 mg/dL — ABNORMAL HIGH (ref 6–23)
CO2: 26 mEq/L (ref 19–32)
Calcium: 7.6 mg/dL — ABNORMAL LOW (ref 8.4–10.5)
Chloride: 109 mEq/L (ref 96–112)
Creatinine, Ser: 1.17 mg/dL — ABNORMAL HIGH (ref 0.50–1.10)
GFR calc non Af Amer: 43 mL/min — ABNORMAL LOW (ref >90)
GFR, EST AFRICAN AMERICAN: 50 mL/min — AB (ref >90)
Glucose, Bld: 134 mg/dL — ABNORMAL HIGH (ref 70–99)
POTASSIUM: 3.4 meq/L — AB (ref 3.7–5.3)
Sodium: 147 mEq/L (ref 137–147)

## 2013-05-04 LAB — HEPATIC FUNCTION PANEL
ALK PHOS: 56 U/L (ref 39–117)
ALT: 230 U/L — ABNORMAL HIGH (ref 0–35)
AST: 104 U/L — ABNORMAL HIGH (ref 0–37)
Albumin: 1.6 g/dL — ABNORMAL LOW (ref 3.5–5.2)
BILIRUBIN DIRECT: 0.4 mg/dL — AB (ref 0.0–0.3)
BILIRUBIN INDIRECT: 0.5 mg/dL (ref 0.3–0.9)
BILIRUBIN TOTAL: 0.9 mg/dL (ref 0.3–1.2)
TOTAL PROTEIN: 4.2 g/dL — AB (ref 6.0–8.3)

## 2013-05-04 LAB — CBC
HCT: 24.3 % — ABNORMAL LOW (ref 36.0–46.0)
HCT: 21.8 % — ABNORMAL LOW (ref 36.0–46.0)
HCT: 21.1 % — ABNORMAL LOW (ref 36.0–46.0)
HCT: 22.3 % — ABNORMAL LOW (ref 36.0–46.0)
HEMOGLOBIN: 7.3 g/dL — AB (ref 12.0–15.0)
Hemoglobin: 8.4 g/dL — ABNORMAL LOW (ref 12.0–15.0)
Hemoglobin: 7.5 g/dL — ABNORMAL LOW (ref 12.0–15.0)
Hemoglobin: 7.1 g/dL — ABNORMAL LOW (ref 12.0–15.0)
MCH: 30.8 pg (ref 26.0–34.0)
MCH: 30.7 pg (ref 26.0–34.0)
MCH: 30.3 pg (ref 26.0–34.0)
MCH: 29.8 pg (ref 26.0–34.0)
MCHC: 34.6 g/dL (ref 30.0–36.0)
MCHC: 34.4 g/dL (ref 30.0–36.0)
MCHC: 33.6 g/dL (ref 30.0–36.0)
MCHC: 32.7 g/dL (ref 30.0–36.0)
MCV: 89 fL (ref 78.0–100.0)
MCV: 89.3 fL (ref 78.0–100.0)
MCV: 90.2 fL (ref 78.0–100.0)
MCV: 91 fL (ref 78.0–100.0)
PLATELETS: DECREASED 10*3/uL (ref 150–400)
PLATELETS: 124 10*3/uL — AB (ref 150–400)
RBC: 2.73 MIL/uL — ABNORMAL LOW (ref 3.87–5.11)
RBC: 2.44 MIL/uL — AB (ref 3.87–5.11)
RBC: 2.34 MIL/uL — ABNORMAL LOW (ref 3.87–5.11)
RBC: 2.45 MIL/uL — ABNORMAL LOW (ref 3.87–5.11)
RDW: 17.2 % — AB (ref 11.5–15.5)
RDW: 18.1 % — AB (ref 11.5–15.5)
RDW: 18.5 % — AB (ref 11.5–15.5)
RDW: 18.8 % — ABNORMAL HIGH (ref 11.5–15.5)
WBC: 20.3 10*3/uL — AB (ref 4.0–10.5)
WBC: 17.9 10*3/uL — ABNORMAL HIGH (ref 4.0–10.5)
WBC: 14.9 10*3/uL — AB (ref 4.0–10.5)
WBC: 14.9 10*3/uL — AB (ref 4.0–10.5)

## 2013-05-04 LAB — GLUCOSE, CAPILLARY
GLUCOSE-CAPILLARY: 128 mg/dL — AB (ref 70–99)
GLUCOSE-CAPILLARY: 157 mg/dL — AB (ref 70–99)
Glucose-Capillary: 121 mg/dL — ABNORMAL HIGH (ref 70–99)
Glucose-Capillary: 121 mg/dL — ABNORMAL HIGH (ref 70–99)
Glucose-Capillary: 134 mg/dL — ABNORMAL HIGH (ref 70–99)
Glucose-Capillary: 153 mg/dL — ABNORMAL HIGH (ref 70–99)

## 2013-05-04 LAB — URINE CULTURE: Colony Count: 30000

## 2013-05-04 LAB — PLATELET COUNT: Platelets: 112 10*3/uL — ABNORMAL LOW (ref 150–400)

## 2013-05-04 LAB — MAGNESIUM: MAGNESIUM: 2.3 mg/dL (ref 1.5–2.5)

## 2013-05-04 LAB — PROCALCITONIN: PROCALCITONIN: 0.67 ng/mL

## 2013-05-04 MED ORDER — POTASSIUM CHLORIDE 20 MEQ/15ML (10%) PO LIQD
40.0000 meq | Freq: Once | ORAL | Status: AC
  Administered 2013-05-04: 40 meq
  Filled 2013-05-04: qty 30

## 2013-05-04 MED ORDER — VITAL 1.5 CAL PO LIQD
1000.0000 mL | ORAL | Status: DC
  Administered 2013-05-04: 1000 mL
  Filled 2013-05-04 (×3): qty 1000

## 2013-05-04 MED ORDER — PRO-STAT SUGAR FREE PO LIQD
30.0000 mL | ORAL | Status: DC
  Administered 2013-05-04 – 2013-05-05 (×2): 30 mL
  Filled 2013-05-04 (×2): qty 30

## 2013-05-04 NOTE — Progress Notes (Signed)
NUTRITION CONSULT/ FOLLOW UP  Intervention:   Initiate TF via NGT utilizing 7M PEPup Protocol.  Initiate Vital 1.5 at 25 ml/hr on day 1; on day 2, increase to goal rate of 40 ml per hour (960 ml) with 30 ml Prostat daily to provide 1540 kcal, 80 g protein, 733 ml free water.  - Recommend SLP evaluation when able to evaluate appropriate diet.    Nutrition Dx:   Inadequate oral intake related to inability to eat as evidenced by NPO status; ongoing  Goal:   Pt to meet >/= 90% of their estimated nutrition needs; unmet  Monitor:   Vent status, initiation of TF or diet, weight, labs  Assessment:   3/3: Patient with recent admission 2/2 with norovirus gastroenteritis, respiratory failure, septic shock. Course complicated by ARDS, AKI, and cardiac arrest. Patient required placement of tracheostomy and tube feeding. Discharged to [facility]. Patient readmitted with hemorrhagic shock. No evidence of upper GI bleeding.  Unable to obtain history from patient. She does have and NG tube in place, clamped at this time. She is currently not receiving nutrition support. Previously, she was receiving Oxepa with 30 ml Prostat TID. However, Oxepa may not be indicated for current condition.   3/4: RD consulted for TF initiation and management. Pt remains on vent support. NG tube in place. Per family pt has not had any PO intake for over one month. Pt expresses hunger and desire to eat. Significant edema.   Patient is currently on ventilator support.  MV: 11 L/min Temp (24hrs), Avg:97.8 F (36.6 C), Min:96.7 F (35.9 C), Max:98.7 F (37.1 C)   Height: Ht Readings from Last 1 Encounters:  05/03/13 5' 2.99" (1.6 m)    Weight Status:   Wt Readings from Last 1 Encounters:  05/04/13 179 lb 10.8 oz (81.5 kg)  Usual body weight 160-165 lbs  Re-estimated needs:  Kcal: 1481-1555 Protein: 80-95 grams Fluid: 1.5-1.7 L/day  Skin: +2 generalized edema, +2 RUE edema, +4 LUE edema, +3 RLE and LLE  edema  Diet Order: NPO   Intake/Output Summary (Last 24 hours) at 05/04/13 1131 Last data filed at 05/04/13 1000  Gross per 24 hour  Intake   3305 ml  Output    810 ml  Net   2495 ml    Last BM: 3/3   Labs:   Recent Labs Lab 05/02/13 0500 05/03/13 0200 05/04/13 0450  NA 140 144 147  K 3.1* 4.1 3.4*  CL 97 104 109  CO2 26 27 26   BUN 54* 65* 56*  CREATININE 1.20* 1.59* 1.17*  CALCIUM 7.9* 7.6* 7.6*  MG  --  2.5  --   PHOS  --  5.3*  --   GLUCOSE 264* 151* 134*    CBG (last 3)   Recent Labs  05/04/13 0013 05/04/13 0427 05/04/13 0739  GLUCAP 153* 121* 128*    Scheduled Meds: . chlorhexidine  15 mL Mouth/Throat BID  . insulin aspart  0-15 Units Subcutaneous 6 times per day  . levothyroxine  37.5 mcg Intravenous Daily  . pantoprazole (PROTONIX) IV  40 mg Intravenous Daily  . piperacillin-tazobactam (ZOSYN)  IV  3.375 g Intravenous 3 times per day  . vancomycin  750 mg Intravenous Q24H    Continuous Infusions: . sodium chloride 100 mL/hr at 05/04/13 1005    Ian Malkin RD, LDN Inpatient Clinical Dietitian Pager: [phone] After Hours Pager: [phone]

## 2013-05-04 NOTE — Procedures (Signed)
RUE PICC 44 cm SVC RA No comp

## 2013-05-04 NOTE — Progress Notes (Addendum)
Note on wrong patient 

## 2013-05-04 NOTE — Progress Notes (Signed)
PULMONARY / CRITICAL CARE MEDICINE  Name: [name] MRN: [id number] DOB: April 23, 1932    ADMISSION DATE:  05/02/2013 CONSULTATION DATE:  05/02/2013  REFERRING MD :  [facility] PRIMARY SERVICE: PCCM  CHIEF COMPLAINT:  Shock  BRIEF PATIENT DESCRIPTION: 80 yo with left subclavian and axillary vein thrombosis documented 2/24 on anticoagulation transferred from [facility] on 3/2 in hemorrhagic shock and acute blood loss anemia.  SIGNIFICANT EVENTS / STUDIES:   3/2  Given Vit K, FFP x 1, PRBC x 2 3/2  Seen by GI >>> No indication for EGD at this time 3/2  CT abdomen / pelvis >>> MODERATE RETROPERITONEAL HEMORRHAGE L psoas / iliacus muscle, adrenal mass, stable cystic adnexal changes  LINES / TUBES: Trach 2/18 >>> LUE PICC 2/20 >>>  CULTURES: 3/2  MRSA PCR >>> neg 3/2  Clostridium difficile PCR >>> neg 3/3  Blood >>> 3/3  Urine >>>  ANTIBIOTICS: Vancomycin 3/3 >>> Zosyn 3/3 >>>  INTERVAL HISTORY:  Hemodynamically stable overnight per RN  VITAL SIGNS: Temp:  [96.7 F (35.9 C)-98.7 F (37.1 C)] 97.2 F (36.2 C) (03/04 1000) Pulse Rate:  [69-89] 70 (03/04 1000) Resp:  [20-34] 22 (03/04 1000) BP: (95-145)/(22-98) 114/38 mmHg (03/04 1000) SpO2:  [100 %] 100 % (03/04 1000) FiO2 (%):  [40 %] 40 % (03/04 1000) Weight:  [81.5 kg (179 lb 10.8 oz)] 81.5 kg (179 lb 10.8 oz) (03/04 0454)  HEMODYNAMICS:   VENTILATOR SETTINGS: Vent Mode:  [-] PRVC FiO2 (%):  [40 %] 40 % Set Rate:  [22 bmp] 22 bmp Vt Set:  [500 mL] 500 mL PEEP:  [5 cmH20] 5 cmH20 Plateau Pressure:  [20 cmH20-24 cmH20] 20 cmH20  INTAKE / OUTPUT: Intake/Output     03/03 0701 - 03/04 0700 03/04 0701 - 03/05 0700   I.V. (mL/kg) 1600 (19.6) 300 (3.7)   Blood 335    IV Piggyback 1250    Total Intake(mL/kg) 3185 (39.1) 300 (3.7)   Urine (mL/kg/hr) 858 (0.4) 10 (0)   Stool     Total Output 858 10   Net +2327 +290          PHYSICAL EXAMINATION: General: Appears chronically ill, no respiratory distress Neuro: Awake, alert,  gesturing  HEENT: Tracheostomy site without bleeding / inflammation Cardiovascular: RRR, no murmurs Lungs: Bilateral air entry Abdomen: Soft, non tender, bowel sounds present Musculoskeletal: Anasarca, ecchymoses upper extremities bilaterally, L > R BUE edema  LABS:  CBC  Recent Labs Lab 05/03/13 2000 05/04/13 0125 05/04/13 0810  WBC 24.4* 20.3* 17.9*  HGB 6.6* 8.4* 7.5*  HCT 20.3* 24.3* 21.8*  PLT PLATELET CLUMPING, SUGGEST RECOLLECTION OF SAMPLE IN CITRATE TUBE. PLATELET CLUMPING, SUGGEST RECOLLECTION OF SAMPLE IN CITRATE TUBE. PLATELET CLUMPS NOTED ON SMEAR, COUNT APPEARS DECREASED   Coag's  Recent Labs Lab 04/30/13 0500  05/02/13 0500 05/03/13 0141 05/03/13 1555  APTT 39*  --   --  40*  --   INR 1.41  < > 2.89* 1.63* 1.05  < > = values in this interval not displayed.  BMET  Recent Labs Lab 05/02/13 0500 05/03/13 0200 05/04/13 0450  NA 140 144 147  K 3.1* 4.1 3.4*  CL 97 104 109  CO2 26 27 26   BUN 54* 65* 56*  CREATININE 1.20* 1.59* 1.17*  GLUCOSE 264* 151* 134*   Electrolytes  Recent Labs Lab 05/02/13 0500 05/03/13 0200 05/04/13 0450  CALCIUM 7.9* 7.6* 7.6*  MG  --  2.5  --   PHOS  --  5.3*  --  Sepsis Markers  Recent Labs Lab 05/03/13 0140 05/03/13 1555 05/04/13 0450  LATICACIDVEN 2.1  --   --   PROCALCITON  --  1.27 0.67   ABG  Recent Labs Lab 04/28/13 0500 04/28/13 1730 05/02/13 1420  PHART 7.493* 7.503* 7.401  PCO2ART 40.1 37.7 27.4*  PO2ART 206.0* 75.0* 383.0*   Liver Enzymes  Recent Labs Lab 04/30/13 1041 05/03/13 0200  AST  --  334*  ALT  --  354*  ALKPHOS  --  63  BILITOT  --  1.2  ALBUMIN 1.8* 2.1*   Cardiac Enzymes No results found for this basename: TROPONINI, PROBNP,  in the last 168 hours  Glucose  Recent Labs Lab 05/03/13 1156 05/03/13 1553 05/03/13 1900 05/04/13 0013 05/04/13 0427 05/04/13 0739  GLUCAP 138* 123* 124* 153* 121* 128*   Imaging Ct Abdomen Pelvis Wo Contrast  05/02/2013    CLINICAL DATA:  Abdominal pain.  Rule out hemorrhage  EXAM: CT ABDOMEN AND PELVIS WITHOUT CONTRAST  TECHNIQUE: Multidetector CT imaging of the abdomen and pelvis was performed following the standard protocol without intravenous contrast.  COMPARISON:  DG ABD PORTABLE 1V dated 05/02/2013  FINDINGS: The patient has sustained an acute hemorrhage within the substance of the iliopsoas muscle on the left. This hemorrhage measures approximately 6.1 cm AP x 6.1 cm transversely at the level of the left iliac crest and extends from the proximal 1/3 of the psoas muscle inferiorly to the proximal left hip. Some blood has tracked outside of the musculature into the left paracolic region and extends into the pelvis and lies just lateral to the left aspect of the urinary bladder. The right iliopsoas muscle is normal in appearance.  The liver exhibits no evidence of parenchymal or subcapsular hemorrhage. There is no focal mass. The gallbladder is adequately distended. Radiodense bile layers in the dependent portion of the gallbladder. The pancreas, right adrenal gland, and kidneys exhibit no acute abnormalities. There is a left adrenal mass which measures 2.3 x 2 cm unchanged since the previous study. This stomach is moderately distended with gas and some fluid. There is a nasogastric tube present with the tip coiled in the gastric cardia. The spleen is small and exhibits no acute abnormality. The caliber of the abdominal aorta is normal. There is no periaortic hemorrhage or lymphadenopathy.  The unopacified loops of small and large bowel exhibit no evidence of ileus nor of obstruction. The urinary bladder is distended despite the presence of a Foley catheter. The uterus appears surgically absent. There are cystic adnexal processes bilaterally. There is no free fluid in the pelvis.  There is increased density within the subcutaneous and deep soft tissues of the flanks especially on the left consistent with edema or ecchymosis. The  lumbar vertebral bodies are preserved in height. There are degenerative disc changes at L3-4 and at L4-5. The bony pelvis exhibits no acute abnormality.  The lung bases exhibit interval clearing of the large right pleural effusion. There remain coarse increased interstitial markings in both lungs and confluent alveolar infiltrate medially in the right lower lobe.  IMPRESSION: 1. There is a moderate-sized retroperitoneal hemorrhage into the left psoas and iliacus muscle. A small amount of fluid tracts into the left paracolic gutter. There is also increased density in the subcutaneous tissues over the flanks consistent with edema or ecchymosis. 2. There is no evidence of hemorrhage into the visceral organs. 3. There is a stable left adrenal mass. 4. The urinary bladder is quite distended despite the presence  of a Foley catheter. Assessment of the functional status of the catheter is needed. 5. There are cystic adnexal processes bilaterally unchanged from the previous study. There has been interval clearing of the right pleural effusion. A right lower lobe infiltrate persists and there is bilateral lower lobe atelectasis or fibrosis. 6. These results were called by telephone at the time of interpretation on 05/02/2013 at 9:36 PM to Corliss Skains, RN,, who verbally acknowledged these results.   Electronically Signed   By: David  [place]   On: 05/02/2013 21:37   Dg Chest Port 1 View  05/03/2013   CLINICAL DATA:  ARDS  EXAM: PORTABLE CHEST - 1 VIEW  COMPARISON:  05/02/2013  FINDINGS: Progression of left upper lobe and left lower lobe infiltrate, worrisome for pneumonia. Improved aeration in the right lung which is now clear.  Small left effusion  Tracheostomy in good position.  NG tube tip in the stomach.  IMPRESSION: Progression of left upper lobe and left lower lobe infiltrate, concerning for pneumonia. Small left effusion.   Electronically Signed   By: Marlan Palau M.D.   On: 05/03/2013 07:40   Dg Abd Portable  1v  05/02/2013   CLINICAL DATA:  Nasogastric tube placement.  EXAM: PORTABLE ABDOMEN - 1 VIEW  COMPARISON:  04/22/2013.  FINDINGS: The distal end of the nasogastric tube is seen coiled in the proximal stomach. Gas is noted throughout the colon. No pathologic distention of small bowel. No gross evidence of pneumoperitoneum.  IMPRESSION: 1. Tip and side-port of nasogastric tube are within the proximal stomach.   Electronically Signed   By: Trudie Reed M.D.   On: 05/02/2013 14:34   ASSESSMENT / PLAN:  PULMONARY A: Acute on chronic respiratory failure Post ARDS pulmonary fibrosis Possible HCAP Tracheostomy status Chronic ventilatory support P:    Goal pH>7.30, SpO2>92 Continuous mechanical support VAP bundle ABG/CXR PRN  CARDIOVASCULAR A:  Hemorrhagic shock, lactic acid reassuring DVT L subclavian / axillary vein Chronic diastolic congestive heart failure, HTN, AF P:  Goal MAP > 65 Place R PICC by IR Remove L PICC after new one is placed  RENAL A:   AKI Distended bladder in spit of foley noted on CT  P:   Trend BMP NS@100  Foley changed  GASTROINTESTINAL A:   Suspected GI hemorrhage, but unlikely given finding of retroperitoneal hemorrhage  Elevated transaminases, shocked liver? Protein calorie malnutrition Diarrhea  GI Px P:   Appreciate GI input >>> no indication for EGD Protonix TF Trend LFTs  HEMATOLOGIC A:   DVT L subclavian / axillary vein Retroperitoneal hemorrhage Acute blood loss anemia Coumadin induced coagulopathy, resolved Thrombocytopenia P:  Goal Hb >=7 Trend CBC q6h ( first now ) Send platelets in citrated tube Transfuse platelets as suspect low and active hemorrhage May need Hematology consult  INFECTIOUS A:   WBC improving, bit no clear source of infection, PCT 0.67 Possible HCAP C.dif ruled out P:   Start Vancomycin / Zosyn Panculture  ENDOCRINE A:  Hypothyroidism Hyperglycemia P:    Synthroid SSI  NEUROLOGIC A: Pain P:   Morphine PRN  I have personally obtained history, examined patient, evaluated and interpreted laboratory and imaging results, reviewed medical records, formulated assessment / plan and placed orders.  CRITICAL CARE:  The patient is critically ill with multiple organ systems failure and requires high complexity decision making for assessment and support, frequent evaluation and titration of therapies, application of advanced monitoring technologies and extensive interpretation of multiple databases. Critical Care Time devoted to  patient care services described in this note is 35 minutes.   Lonia FarberZUBELEVITSKIY, Ascencion Stegner, MD Pulmonary and Critical Care Medicine [facility] Pager: [phone]  05/04/2013, 12:06 PM

## 2013-05-04 NOTE — Progress Notes (Signed)
Transported to IR for PICC placement.

## 2013-05-05 ENCOUNTER — Other Ambulatory Visit (HOSPITAL_COMMUNITY): Payer: Self-pay

## 2013-05-05 ENCOUNTER — Inpatient Hospital Stay
Admission: AD | Admit: 2013-05-05 | Discharge: 2013-06-14 | Disposition: A | Discharge status: Skilled Nursing Facility | Payer: Self-pay | Source: Ambulatory Visit | Attending: Internal Medicine | Admitting: Internal Medicine

## 2013-05-05 DIAGNOSIS — I82629 Acute embolism and thrombosis of deep veins of unspecified upper extremity: Secondary | ICD-10-CM

## 2013-05-05 DIAGNOSIS — R410 Disorientation, unspecified: Secondary | ICD-10-CM

## 2013-05-05 DIAGNOSIS — I442 Atrioventricular block, complete: Secondary | ICD-10-CM

## 2013-05-05 DIAGNOSIS — I4891 Unspecified atrial fibrillation: Secondary | ICD-10-CM

## 2013-05-05 DIAGNOSIS — J9690 Respiratory failure, unspecified, unspecified whether with hypoxia or hypercapnia: Secondary | ICD-10-CM

## 2013-05-05 DIAGNOSIS — Z930 Tracheostomy status: Secondary | ICD-10-CM

## 2013-05-05 DIAGNOSIS — N179 Acute kidney failure, unspecified: Secondary | ICD-10-CM

## 2013-05-05 DIAGNOSIS — N189 Chronic kidney disease, unspecified: Secondary | ICD-10-CM

## 2013-05-05 DIAGNOSIS — J9620 Acute and chronic respiratory failure, unspecified whether with hypoxia or hypercapnia: Secondary | ICD-10-CM

## 2013-05-05 LAB — BASIC METABOLIC PANEL WITH GFR
BUN: 45 mg/dL — ABNORMAL HIGH (ref 6–23)
CO2: 25 meq/L (ref 19–32)
Calcium: 7.3 mg/dL — ABNORMAL LOW (ref 8.4–10.5)
Chloride: 114 meq/L — ABNORMAL HIGH (ref 96–112)
Creatinine, Ser: 0.98 mg/dL (ref 0.50–1.10)
GFR calc Af Amer: 62 mL/min — ABNORMAL LOW
GFR calc non Af Amer: 54 mL/min — ABNORMAL LOW
Glucose, Bld: 185 mg/dL — ABNORMAL HIGH (ref 70–99)
Potassium: 3.6 meq/L — ABNORMAL LOW (ref 3.7–5.3)
Sodium: 149 meq/L — ABNORMAL HIGH (ref 137–147)

## 2013-05-05 LAB — CBC
HCT: 22.2 % — ABNORMAL LOW (ref 36.0–46.0)
HCT: 22 % — ABNORMAL LOW (ref 36.0–46.0)
HCT: 23 % — ABNORMAL LOW (ref 36.0–46.0)
HEMOGLOBIN: 7.6 g/dL — AB (ref 12.0–15.0)
Hemoglobin: 7.5 g/dL — ABNORMAL LOW (ref 12.0–15.0)
Hemoglobin: 7.4 g/dL — ABNORMAL LOW (ref 12.0–15.0)
MCH: 31.2 pg (ref 26.0–34.0)
MCH: 31 pg (ref 26.0–34.0)
MCH: 30.6 pg (ref 26.0–34.0)
MCHC: 33.8 g/dL (ref 30.0–36.0)
MCHC: 33.6 g/dL (ref 30.0–36.0)
MCHC: 33 g/dL (ref 30.0–36.0)
MCV: 92.8 fL (ref 78.0–100.0)
MCV: 92.7 fL (ref 78.0–100.0)
MCV: 91.7 fL (ref 78.0–100.0)
PLATELETS: 120 10*3/uL — AB (ref 150–400)
Platelets: 135 K/uL — ABNORMAL LOW (ref 150–400)
Platelets: 134 10*3/uL — ABNORMAL LOW (ref 150–400)
RBC: 2.42 MIL/uL — ABNORMAL LOW (ref 3.87–5.11)
RBC: 2.37 MIL/uL — ABNORMAL LOW (ref 3.87–5.11)
RBC: 2.48 MIL/uL — ABNORMAL LOW (ref 3.87–5.11)
RDW: 19.3 % — ABNORMAL HIGH (ref 11.5–15.5)
RDW: 19 % — AB (ref 11.5–15.5)
RDW: 19.3 % — AB (ref 11.5–15.5)
WBC: 14.5 10*3/uL — ABNORMAL HIGH (ref 4.0–10.5)
WBC: 14.9 K/uL — ABNORMAL HIGH (ref 4.0–10.5)
WBC: 14.3 10*3/uL — ABNORMAL HIGH (ref 4.0–10.5)

## 2013-05-05 LAB — GLUCOSE, CAPILLARY
Glucose-Capillary: 179 mg/dL — ABNORMAL HIGH (ref 70–99)
Glucose-Capillary: 193 mg/dL — ABNORMAL HIGH (ref 70–99)
Glucose-Capillary: 184 mg/dL — ABNORMAL HIGH (ref 70–99)
Glucose-Capillary: 171 mg/dL — ABNORMAL HIGH (ref 70–99)
Glucose-Capillary: 166 mg/dL — ABNORMAL HIGH (ref 70–99)

## 2013-05-05 LAB — MAGNESIUM
MAGNESIUM: 2.6 mg/dL — AB (ref 1.5–2.5)
Magnesium: 2.5 mg/dL (ref 1.5–2.5)

## 2013-05-05 LAB — PROCALCITONIN: Procalcitonin: 0.33 ng/mL

## 2013-05-05 LAB — PHOSPHORUS
Phosphorus: 2.5 mg/dL (ref 2.3–4.6)
Phosphorus: 2.4 mg/dL (ref 2.3–4.6)

## 2013-05-05 MED ORDER — METHYLPREDNISOLONE SODIUM SUCC 40 MG IJ SOLR: 40.0000 mg | INTRAMUSCULAR | Status: DC

## 2013-05-05 MED ORDER — MORPHINE SULFATE 2 MG/ML IJ SOLN: 2.0000 mg | INTRAMUSCULAR | Status: DC | PRN

## 2013-05-05 MED ORDER — INSULIN ASPART 100 UNIT/ML ~~LOC~~ SOLN: 0.0000 [IU] | SUBCUTANEOUS | Status: DC

## 2013-05-05 MED ORDER — PRO-STAT SUGAR FREE PO LIQD: 30.0000 mL | ORAL | Status: DC

## 2013-05-05 MED ORDER — VANCOMYCIN HCL IN DEXTROSE 750-5 MG/150ML-% IV SOLN
750.0000 mg | Freq: Two times a day (BID) | INTRAVENOUS | Status: DC
  Administered 2013-05-05: 750 mg via INTRAVENOUS
  Filled 2013-05-05 (×2): qty 150

## 2013-05-05 MED ORDER — LEVOTHYROXINE SODIUM 75 MCG PO TABS: 37.5000 ug | ORAL_TABLET | Freq: Every day | ORAL | Status: DC

## 2013-05-05 MED ORDER — VANCOMYCIN HCL IN DEXTROSE 750-5 MG/150ML-% IV SOLN: 750.0000 mg | Freq: Two times a day (BID) | INTRAVENOUS | Status: DC

## 2013-05-05 MED ORDER — VITAL 1.5 CAL PO LIQD: 1000.0000 mL | ORAL | Status: DC

## 2013-05-05 MED ORDER — PIPERACILLIN-TAZOBACTAM 3.375 G IVPB: 3.3750 g | Freq: Three times a day (TID) | INTRAVENOUS | Status: DC

## 2013-05-05 MED ORDER — PANTOPRAZOLE SODIUM 40 MG IV SOLR: 40.0000 mg | Freq: Every day | INTRAVENOUS | Status: DC

## 2013-05-05 MED ORDER — LEVOTHYROXINE SODIUM 75 MCG PO TABS
37.5000 ug | ORAL_TABLET | Freq: Every day | ORAL | Status: DC
Start: 2013-05-06 — End: 2013-05-05
  Filled 2013-05-05: qty 0.5

## 2013-05-05 NOTE — Plan of Care (Signed)
Problem: Phase I Progression Outcomes Goal: Tracheostomy by Vent Day 14 Outcome: Completed/Met Date Met:  05/05/13 Trach placed prior to this admission

## 2013-05-05 NOTE — Progress Notes (Signed)
[facility]                                                                                              Progress note     Patient Demographics  [name], is a 80 y.o. female  GSU:[id number]  OPF:[id number]  DOB - October 27, 1932  Admit date - 05/05/2013  Admitting Physician Carron Curie, MD  Outpatient Primary MD for the patient is [name], MD  LOS - 0   CC : Respiratory failure          Norovirus         Retroperitoneal hematoma         DVT                                                                                                                                                                                                      Readmission note  Patient is a 80 year old female with past medical history significant for congestive heart failure and atrial fibrillation who presented to the hospital on 2/20/ 2015 with norovirus gastroenteritis complicated with respiratory failure, and septic shock . her stay was complicated with cardiac arrest requiring cardiopulmonary resuscitation for approximately 9 minutes due to mucus plugging and improved with reintubation however she developed left pneumothorax and a left chest tube was placed. She failed extubation in the referring hospital and was transferred to our facility where she was continued on Solu-Medrol, Zosyn Protonix and Lasix and aspirin. However during her stay she developed  left upper extremity DVT and was started on Lovenox and Coumadin, and transferred to MICU on 3 /2 for hypovolemic, hemorrhagic shock. The patient was found to have a large retroperitoneal hematoma and received packed RBCs fresh frozen plasma and vitamin K. She was continue the mechanical ventilation and was started on IV antibiotics vancomycin and Zosyn for leukocytosis patient was transferred back to our facility today for further management                    Subjective:   [name] denies chest pains shortness of breath nausea vomiting or diarrhea  Objective:   Vital signs  Temperature Heart rate 70 Respiratory rate  21 Blood pressure Pulse ox100%    Exam Awake Alert, , No new F.N deficits,  [place].AT,PERRAL NG tube noted in place Supple Neck,No JVD, No cervical lymphadenopathy appriciated. Tracheostomy noted  Symmetrical Chest wall movement, decreased breath sounds bilaterally RRR,No Gallops,Rubs or new Murmurs, No Parasternal Heave +ve B.Sounds, Abd Soft, mild generalized tenderness, No organomegaly appriciated, No rebound - guarding or rigidity. No Cyanosis, Clubbing or edema, No new Rash or bruise     I&Os  Weight  Foley yes  Trach yes  Data Review   CBC  Recent Labs Lab 05/04/13 1342 05/04/13 1942 05/05/13 0050 05/05/13 0429 05/05/13 0800  WBC 14.9* 14.9* 14.5* 14.9* 14.3*  HGB 7.1* 7.3* 7.5* 7.4* 7.6*  HCT 21.1* 22.3* 22.2* 22.0* 23.0*  PLT PLATELET CLUMPING SEEN, RECOLLECT IN A CITRATE TUBE 124* 120* 135* 134*  MCV 90.2 91.0 91.7 92.8 92.7  MCH 30.3 29.8 31.0 31.2 30.6  MCHC 33.6 32.7 33.8 33.6 33.0  RDW 18.5* 18.8* 19.0* 19.3* 19.3*    Chemistries   Recent Labs Lab 05/01/13 0500 05/02/13 0500 05/03/13 0200 05/04/13 0450 05/04/13 1201 05/04/13 1342 05/05/13 0048 05/05/13 0429 05/05/13 1224  NA 145 140 144 147  --   --   --  149*  --   K 3.3* 3.1* 4.1 3.4*  --   --   --  3.6*  --   CL 103 97 104 109  --   --   --  114*  --   CO2 30 26 27 26   --   --   --  25  --   GLUCOSE 166* 264* 151* 134*  --   --   --  185*  --   BUN 38* 54* 65* 56*  --   --   --  45*  --   CREATININE 0.88 1.20* 1.59* 1.17*  --   --   --  0.98  --   CALCIUM 7.8* 7.9* 7.6* 7.6*  --   --   --  7.3*  --   MG  --   --  2.5  --  2.3  --  2.6*  --  2.5  AST  --   --  334*  --   --  104*  --   --   --   ALT  --   --  354*  --   --  230*  --   --   --   ALKPHOS  --   --  63  --   --  56  --   --   --   BILITOT  --    --  1.2  --   --  0.9  --   --   --    Coagulation profile  Recent Labs Lab 04/30/13 0500 05/01/13 0500 05/02/13 0500 05/03/13 0141 05/03/13 1555  INR 1.41 1.89* 2.89* 1.63* 1.05    No results found for this basename: DDIMER,  in the last 72 hours  Cardiac Enzymes No results found for this basename: CK, CKMB, TROPONINI, MYOGLOBIN,  in the last 168 hours ------------------------------------------------------------------------------------------------------------------ No components found with this basename: POCBNP,   Micro Results Recent Results (from the past 240 hour(s))  CLOSTRIDIUM DIFFICILE BY PCR     Status: None   Collection Time    05/02/13  4:00 PM      Result Value Ref Range Status   C difficile by pcr NEGATIVE  NEGATIVE Final  MRSA PCR SCREENING  Status: None   Collection Time    05/02/13  6:02 PM      Result Value Ref Range Status   MRSA by PCR NEGATIVE  NEGATIVE Final   Comment:            The GeneXpert MRSA Assay (FDA     approved for NASAL specimens     only), is one component of a     comprehensive MRSA colonization     surveillance program. It is not     intended to diagnose MRSA     infection nor to guide or     monitor treatment for     MRSA infections.  CULTURE, BLOOD (ROUTINE X 2)     Status: None   Collection Time    05/03/13  2:40 PM      Result Value Ref Range Status   Specimen Description BLOOD RIGHT HAND   Final   Special Requests BOTTLES DRAWN AEROBIC ONLY 2CC   Final   Culture  Setup Time     Final   Value: 05/03/2013 22:32     [facility]formed at [facility]   Culture     Final   Value:        BLOOD CULTURE RECEIVED NO GROWTH TO DATE CULTURE WILL BE HELD FOR 5 DAYS BEFORE ISSUING A FINAL NEGATIVE REPORT     Performed at [facility]   Report Status PENDING   Incomplete  CULTURE, BLOOD (ROUTINE X 2)     Status: None   Collection Time    05/03/13  3:55 PM      Result Value Ref Range Status   Specimen Description BLOOD  RIGHT FOOT   Final   Special Requests BOTTLES DRAWN AEROBIC ONLY 3CC   Final   Culture  Setup Time     Final   Value: 05/04/2013 01:11     Performed at [facility]   Culture     Final   Value:        BLOOD CULTURE RECEIVED NO GROWTH TO DATE CULTURE WILL BE HELD FOR 5 DAYS BEFORE ISSUING A FINAL NEGATIVE REPORT     Performed at [facility]   Report Status PENDING   Incomplete  URINE CULTURE     Status: None   Collection Time    05/03/13  4:00 PM      Result Value Ref Range Status   Specimen Description URINE, CATHETERIZED   Final   Special Requests NONE   Final   Culture  Setup Time     Final   Value: 05/03/2013 16:11     Performed at [facility]   Colony Count     Final   Value: 30,000 COLONIES/ML     Performed at [facility]   Culture     Final   Value: Multiple bacterial morphotypes present, none predominant. Suggest appropriate recollection if clinically indicated.     Performed at [facility]   Report Status 05/04/2013 FINAL   Final       Assessment & Plan  Respiratory failure; continue with PSV trials Hemorrhagic shock; reversed by fresh frozen plasma vitamin K and blood transfusion last hemoglobin was 7.6 Left pneumothorax status post chest tube placement SIRS continue with IV vancomycin and Zosyn Hypertension A. fib controlled rate at 71 Protein calorie malnutrition; continue with Vital 1.5 the NG tube Generalized weakness; PT OT to evaluate in  [place] Retroperitoneal bleed History of norovirus  Code Status:  full  Family Communication: Discussed with family who was at bedside  Consults  PCCM. and ID  Antibiotics vancomycin and Zosyn  DVT Prophylaxis  SCDs   Carron Curie M.D on 05/05/2013 at 10:22 PM

## 2013-05-05 NOTE — Progress Notes (Signed)
ANTIBIOTIC CONSULT NOTE - Follow Up  Pharmacy Consult for Vancomycin and Zosyn Indication: suspected PNA  Allergies  Allergen Reactions  . Beta Adrenergic Blockers Other (See Comments)    Not working well for pt.  Sherrie Mustache Hcl] Shortness Of Breath  . Carvedilol Shortness Of Breath    Chest  Discomfort.  . Dapsone Other (See Comments)    Kidney  failure  . Diclofenac Sodium Other (See Comments)    Causes  Fluid  Retention.  Consuelo Pandy [Pitavastatin Calcium] Shortness Of Breath  . Methotrexate Shortness Of Breath    Mouth sores.  . Metoprolol Succinate Hives  . Oxaprozin Diarrhea  . Other     Fragrance mixtures -  Cinol botanicals  &  Plants oil. Cocamidoproy   -   Betaine. Lipstick & other makeup & cleaning materials.   Marland Kitchen Epinephrine    Patient Measurements: Height: 5' 2.99" (160 cm) Weight: 173 lb 4.5 oz (78.6 kg) IBW/kg (Calculated) : 52.38  Vital Signs: Temp: 98.2 F (36.8 C) (03/05 1000) Temp src: Core (Comment) (03/05 0800) BP: 150/48 mmHg (03/05 1000) Pulse Rate: 70 (03/05 1000) Intake/Output from previous day: 03/04 0701 - 03/05 0700 In: 2318.3 [I.V.:1650; NG/GT:405.8; IV Piggyback:262.5] Out: 1130 [Urine:1055; Emesis/NG output:75] Intake/Output from this shift: Total I/O In: 135 [NG/GT:110; IV Piggyback:25] Out: 120 [Urine:120]  Labs:  Recent Labs  05/03/13 0200  05/04/13 0450  05/05/13 0050 05/05/13 0429 05/05/13 0800  WBC  --   < >  --   < > 14.5* 14.9* 14.3*  HGB  --   < >  --   < > 7.5* 7.4* 7.6*  PLT  --   < >  --   < > 120* 135* 134*  CREATININE 1.59*  --  1.17*  --   --  0.98  --   < > = values in this interval not displayed. Estimated Creatinine Clearance: 47 ml/min (by C-G formula based on Cr of 0.98).  Microbiology: Recent Results (from the past 720 hour(s))  CULTURE, RESPIRATORY (NON-EXPECTORATED)     Status: None   Collection Time    04/06/13  1:30 PM      Result Value Ref Range Status   Specimen Description TRACHEAL  ASPIRATE   Final   Special Requests Normal   Final   Gram Stain     Final   Value: RARE WBC PRESENT, PREDOMINANTLY PMN     NO SQUAMOUS EPITHELIAL CELLS SEEN     NO ORGANISMS SEEN     Performed at [facility]   Culture     Final   Value: NO GROWTH 2 DAYS     Performed at [facility]   Report Status 04/08/2013 FINAL   Final  CULTURE, RESPIRATORY (NON-EXPECTORATED)     Status: None   Collection Time    04/08/13  4:55 PM      Result Value Ref Range Status   Specimen Description SPUTUM   Final   Special Requests NONE   Final   Gram Stain     Final   Value: MODERATE WBC PRESENT,BOTH PMN AND MONONUCLEAR     NO SQUAMOUS EPITHELIAL CELLS SEEN     NO ORGANISMS SEEN     Performed at [facility]   Culture     Final   Value: NO GROWTH 2 DAYS     Performed at [facility]   Report Status 04/11/2013 FINAL   Final  CULTURE, BLOOD (ROUTINE X 2)  Status: None   Collection Time    04/13/13  9:38 AM      Result Value Ref Range Status   Specimen Description BLOOD RIGHT ARM   Final   Special Requests BOTTLES DRAWN AEROBIC AND ANAEROBIC 5CC   Final   Culture  Setup Time     Final   Value: 04/13/2013 12:36     [facility]   Culture     Final   Value: NO GROWTH 5 DAYS     [facility]formed at [facility]   Report Status 04/19/2013 FINAL   Final  URINE CULTURE     Status: None   Collection Time    04/13/13  9:40 AM      Result Value Ref Range Status   Specimen Description URINE, CATHETERIZED   Final   Special Requests NONE   Final   Culture  Setup Time     Final   Value: 04/13/2013 12:47     Performed at [facility]   Colony Count     Final   Value: >=100,000 COLONIES/ML     [facility]formed at [facility]   Culture     Final   Value: YEAST     [facility]formed at [facility]   Report Status 04/14/2013 FINAL   Final  CULTURE, RESPIRATORY (NON-EXPECTORATED)     Status: None   Collection Time    04/13/13 11:13 AM       Result Value Ref Range Status   Specimen Description TRACHEAL ASPIRATE   Final   Special Requests NONE   Final   Gram Stain     Final   Value: MODERATE WBC PRESENT, PREDOMINANTLY PMN     RARE SQUAMOUS EPITHELIAL CELLS PRESENT     NO ORGANISMS SEEN     [facility]formed at [facility]   Culture     Final   Value: FEW CANDIDA ALBICANS     Performed at [facility]   Report Status 04/15/2013 FINAL   Final  OVA AND PARASITE EXAMINATION     Status: None   Collection Time    04/17/13  6:00 AM      Result Value Ref Range Status   Specimen Description STOOL   Final   Special Requests NONE   Final   Ova and parasites     Final   Value: NO OVA OR PARASITES SEEN     Performed at [facility]   Report Status 04/19/2013 FINAL   Final  CLOSTRIDIUM DIFFICILE BY PCR     Status: None   Collection Time    04/18/13  9:10 AM      Result Value Ref Range Status   C difficile by pcr NEGATIVE  NEGATIVE Final   Comment: [facility]  CLOSTRIDIUM DIFFICILE BY PCR     Status: None   Collection Time    05/02/13  4:00 PM      Result Value Ref Range Status   C difficile by pcr NEGATIVE  NEGATIVE Final  MRSA PCR SCREENING     Status: None   Collection Time    05/02/13  6:02 PM      Result Value Ref Range Status   MRSA by PCR NEGATIVE  NEGATIVE Final   Comment:            The GeneXpert MRSA Assay (FDA     approved for NASAL specimens     only), is  one component of a     comprehensive MRSA colonization     surveillance program. It is not     intended to diagnose MRSA     infection nor to guide or     monitor treatment for     MRSA infections.  CULTURE, BLOOD (ROUTINE X 2)     Status: None   Collection Time    05/03/13  2:40 PM      Result Value Ref Range Status   Specimen Description BLOOD RIGHT HAND   Final   Special Requests BOTTLES DRAWN AEROBIC ONLY 2CC   Final   Culture  Setup Time     Final   Value: 05/03/2013 22:32     Performed at [facility]   Culture     Final   Value:        BLOOD CULTURE RECEIVED NO GROWTH TO DATE CULTURE WILL BE HELD FOR 5 DAYS BEFORE ISSUING A FINAL NEGATIVE REPORT     Performed at [facility]   Report Status PENDING   Incomplete  CULTURE, BLOOD (ROUTINE X 2)     Status: None   Collection Time    05/03/13  3:55 PM      Result Value Ref Range Status   Specimen Description BLOOD RIGHT FOOT   Final   Special Requests BOTTLES DRAWN AEROBIC ONLY 3CC   Final   Culture  Setup Time     Final   Value: 05/04/2013 01:11     Performed at [facility]   Culture     Final   Value:        BLOOD CULTURE RECEIVED NO GROWTH TO DATE CULTURE WILL BE HELD FOR 5 DAYS BEFORE ISSUING A FINAL NEGATIVE REPORT     Performed at [facility]   Report Status PENDING   Incomplete  URINE CULTURE     Status: None   Collection Time    05/03/13  4:00 PM      Result Value Ref Range Status   Specimen Description URINE, CATHETERIZED   Final   Special Requests NONE   Final   Culture  Setup Time     Final   Value: 05/03/2013 16:11     Performed at [facility] Count     Final   Value: 30,000 COLONIES/ML     Performed at [facility]   Culture     Final   Value: Multiple bacterial morphotypes present, none predominant. Suggest appropriate recollection if clinically indicated.     Performed at [facility]   Report Status 05/04/2013 FINAL   Final   Assessment: 80 yo female with recent complicated ICU stay at [facility] from gastroenteritis with ARDS, VDRF, cardiac arrest, PTX s/p trach. She was transferred to [facility] 2/20 for further rehab and wean from vent.  Pt started on broad spectum antibiotics for possible HCAP. Cultures pending. Est CrCl 30 ml/min.  Goal of Therapy:  Vancomycin trough level 15-20 mcg/ml  Plan:  1.  Will continue Zosyn 3.375gm IV q8h - each dose over 4 hours 2.  Will change Vancomycin 750mg  IV to every 12 hours 3.  Will f/u micro data, renal function,  pt's clinical condition, trough prn  Nadara Mustard, PharmD., MS Clinical Pharmacist Pager:  [phone] Thank you for allowing pharmacy to be part of this patients care team. 05/05/2013,11:02 AM

## 2013-05-05 NOTE — Discharge Summary (Signed)
Physician Discharge Summary  Patient ID: [name] MRN: [id number] DOB/AGE: 08-01-1932 80 y.o.  Admit date: 05/02/2013 Discharge date: 05/05/2013    Discharge Diagnoses:  Active Problems:   Respiratory failure   Tracheostomy status   Hemorrhagic shock   DVT of upper extremity (deep vein thrombosis)    Brief Summary: [name] is a 80 y/o F admitted to [facility] 2/2 with norovirus gastroenteritis, respiratory failure, hypovolemic and presumed septic shock -source unclear. Course complicated by ARDS, AKI. 2/8 am suffered mucus plugging with ETT obstruction with subsequent cardiac arrest. Required CPR x 9 mins, improved with re-intubation & left pneumothorax decompression. Failed extubation on 2/18 due to severe deconditioning & hypoxia & required tstomy. Tx to [facility] on 2/20. Pt found to have L [place] DVT and was rx'd with lovenox / coumadin. 3/2 noted to have hemorrhagic shock, Tx back to MICU.  Found to have large retroperitoneal bleed.  INR was reversed with Vit K, FFP, blood products given.  L PICC was removed and R PICC placed.  Also noted leukocytosis, no specific source of infection found, but improved with abx which will continue for total 7 days.  She remains on full vent support but ready to continue attempts at weaning per [facility] protocol. NO further anticoagulation at this time.    SIGNIFICANT EVENTS / STUDIES:  3/2 Given Vit K, FFP x 1, PRBC x 2  3/2 Seen by GI >>> No indication for EGD at this time  3/2 CT abdomen / pelvis >>> MODERATE RETROPERITONEAL HEMORRHAGE L psoas / iliacus muscle, adrenal mass, stable cystic adnexal changes   LINES / TUBES:  Trach 2/18 >>>  LUE PICC 2/20 >>>   CULTURES:  3/2 MRSA PCR >>> neg  3/2 Clostridium difficile PCR >>> neg  3/3 Blood >>>  3/3 Urine >>> mult morph>>>recullect  3/5 urine>>>  ANTIBIOTICS:  Vancomycin 3/3 >>>  Zosyn 3/3 >>>                                                                    D/C Plan by  Discharge Diagnosis  Acute on chronic respiratory failure - post ARDS pulmonary fibrosis - s/p trach  ?HCAP D/c plan --  Cont attempts at vent wean per protocol  F/u CXR  abx as above  Mobilize  Trend wbc, fever curve  F/u cultures   Retroperitoneal bleed  - in setting anticoagulation for LUE DVT  LUE DVT  Hemorrhagic shock  D/c plan -- Monitor cbc  NO anticoagulation  F/u coags   Other problems cont prior plan of care   AKI  Protein calorie malnutrition  Hyperthyroid  Deconditioning      Filed Vitals:   05/05/13 1100 05/05/13 1200 05/05/13 1300 05/05/13 1326  BP: 112/57 129/44 127/43 127/43  Pulse: 70 70 70 70  Temp: 98.4 F (36.9 C) 98.2 F (36.8 C) 98.3 F (36.8 C)   TempSrc:  Core (Comment)    Resp: $Remo'19 21 19 21  'faDDr$ Height:      Weight:      SpO2: 100% 100% 100% 100%     Discharge Labs  BMET  Recent Labs Lab 05/01/13 0500 05/02/13 0500 05/03/13 0200 05/04/13 0450 05/04/13 1201 05/05/13 0048 05/05/13 0429 05/05/13 1224  NA 145 140 144 147  --   --  149*  --   K 3.3* 3.1* 4.1 3.4*  --   --  3.6*  --   CL 103 97 104 109  --   --  114*  --   CO2 $Re'30 26 27 26  'xaq$ --   --  25  --   GLUCOSE 166* 264* 151* 134*  --   --  185*  --   BUN 38* 54* 65* 56*  --   --  45*  --   CREATININE 0.88 1.20* 1.59* 1.17*  --   --  0.98  --   CALCIUM 7.8* 7.9* 7.6* 7.6*  --   --  7.3*  --   MG  --   --  2.5  --  2.3 2.6*  --  2.5  PHOS  --   --  5.3*  --  2.9 2.5  --  2.4     CBC   Recent Labs Lab 05/05/13 0050 05/05/13 0429 05/05/13 0800  HGB 7.5* 7.4* 7.6*  HCT 22.2* 22.0* 23.0*  WBC 14.5* 14.9* 14.3*  PLT 120* 135* 134*   Anti-Coagulation  Recent Labs Lab 04/30/13 0500 05/01/13 0500 05/02/13 0500 05/03/13 0141 05/03/13 1555  INR 1.41 1.89* 2.89* 1.63* 1.05       Future Appointments Provider Department Dept Phone   11/18/2013 12:45 PM Lyman Speller, MD [place] [phone]           Follow-up Information    Follow up with Osborne Casco, MD In 1 week. (after discharge)    Specialty:  Family Medicine   Contact information:   [address] [place] [address] [phone]       Follow up with [place] [facility]. [facility] clinic - 2-3 weeks after d/c from [facility] if still has trach )    Specialty:  Respiratory Therapy   Contact information:   [address] [id number] [place] [address] [phone]         Medication List    STOP taking these medications       aspirin 325 MG tablet     chlorhexidine 0.12 % solution  Commonly known as:  PERIDEX     clonazePAM 0.5 MG tablet  Commonly known as:  KLONOPIN     dextrose 40 % Gel  Commonly known as:  GLUTOSE     dextrose 5 % SOLN 50 mL with magnesium sulfate 50 % SOLN 1 g     dextrose 50 % solution     enoxaparin 80 MG/0.8ML injection  Commonly known as:  LOVENOX     fluticasone 50 MCG/ACT nasal spray  Commonly known as:  FLONASE     furosemide 20 MG tablet  Commonly known as:  LASIX     GLUCAGEN 1 MG Solr injection  Generic drug:  glucagon     hydrocortisone 2.5 % cream     insulin detemir 100 UNIT/ML injection  Commonly known as:  LEVEMIR     magnesium oxide 400 MG tablet  Commonly known as:  MAG-OX     metroNIDAZOLE 0.75 % cream  Commonly known as:  METROCREAM     nitroGLYCERIN 0.4 MG SL tablet  Commonly known as:  NITROSTAT     ondansetron 4 MG tablet  Commonly known as:  ZOFRAN     ondansetron 4 MG/2ML Soln injection  Commonly known as:  ZOFRAN  oxyCODONE 5 MG immediate release tablet  Commonly known as:  Oxy IR/ROXICODONE     pantoprazole sodium 40 mg/20 mL Pack  Commonly known as:  PROTONIX  Replaced by:  pantoprazole 40 MG injection     potassium chloride 10 MEQ/50ML     potassium chloride 40 MEQ/15ML (20%) Liqd     potassium chloride SA 20 MEQ tablet  Commonly known as:  K-DUR,KLOR-CON     PRESCRIPTION MEDICATION      sennosides-docusate sodium 8.6-50 MG tablet  Commonly known as:  SENOKOT-S     sodium bicarbonate 650 MG tablet     sodium polystyrene 15 GM/60ML suspension  Commonly known as:  KAYEXALATE     warfarin 5 MG tablet  Commonly known as:  COUMADIN      TAKE these medications       acetaminophen 650 MG suppository  Commonly known as:  TYLENOL  Place 650 mg rectally every 6 (six) hours as needed for mild pain or fever.     acetaminophen 325 MG tablet  Commonly known as:  TYLENOL  Take 650 mg by mouth every 6 (six) hours as needed for mild pain or fever.     albuterol (2.5 MG/3ML) 0.083% nebulizer solution  Commonly known as:  PROVENTIL  Take 2.5 mg by nebulization every 2 (two) hours as needed for wheezing or shortness of breath.     feeding supplement (PRO-STAT SUGAR FREE 64) Liqd  Place 30 mLs into feeding tube daily.     feeding supplement (VITAL 1.5 CAL) Liqd  Place 1,000 mLs into feeding tube daily.     folic acid 1 MG tablet  Commonly known as:  FOLVITE  Take 1 mg by mouth daily.     insulin aspart 100 UNIT/ML injection  Commonly known as:  novoLOG  Inject 0-15 Units into the skin every 4 (four) hours.     ipratropium-albuterol 0.5-2.5 (3) MG/3ML Soln  Commonly known as:  DUONEB  Take 3 mLs by nebulization every 6 (six) hours.     levothyroxine 75 MCG tablet  Commonly known as:  SYNTHROID, LEVOTHROID  Place 0.5 tablets (37.5 mcg total) into feeding tube daily before breakfast.  Start taking on:  05/06/2013     methylPREDNISolone sodium succinate 40 mg/mL injection  Commonly known as:  SOLU-MEDROL  Inject 1 mL (40 mg total) into the vein daily.     morphine 2 MG/ML injection  Inject 1 mL (2 mg total) into the vein every 3 (three) hours as needed.     pantoprazole 40 MG injection  Commonly known as:  PROTONIX  Inject 40 mg into the vein daily.     piperacillin-tazobactam 3.375 GM/50ML IVPB  Commonly known as:  ZOSYN  Inject 50 mLs (3.375 g total) into the  vein every 8 (eight) hours.     sertraline 50 MG tablet  Commonly known as:  ZOLOFT  Take 50 mg by mouth daily.     Vancomycin 750 MG/150ML Soln  Commonly known as:  VANCOCIN  Inject 150 mLs (750 mg total) into the vein every 12 (twelve) hours.         Disposition: [facility] Not Defined - [facility]  Discharged Condition: [name] has met maximum benefit of inpatient care and is medically stable and cleared for discharge.  Patient is pending follow up as above.      Time spent on disposition:  Greater than 35 minutes.   SignedDarlina Sicilian, NP 05/05/2013  2:04  PM Pager: [phone]) [phone] or [phone]  *Care during the described time interval was provided by me and/or other providers on the critical care team. I have reviewed this patient's available data, including medical history, events of note, physical examination and test results as part of my evaluation.  Doree Fudge, MD Pulmonary and [place] Pager: [phone]

## 2013-05-05 NOTE — Therapy (Signed)
Pt transported to [facility] and placed on Vent.

## 2013-05-05 NOTE — Plan of Care (Signed)
Problem: Phase I Progression Outcomes Goal: Optimized method of communication. Outcome: Completed/Met Date Met:  05/05/13 Able to communicate by mouthing words and writing notes

## 2013-05-05 NOTE — Consult Note (Signed)
Name: [name] MRN: [id number] DOB: 1932-06-19    ADMISSION DATE:  05/05/2013 CONSULTATION DATE:  3/5   REFERRING MD :  Hijazi  PRIMARY SERVICE:  Hijazi   CHIEF COMPLAINT:  Vent management    BRIEF PATIENT DESCRIPTION:  80 yo with left subclavian and axillary vein thrombosis documented 2/24 on anticoagulation transferred from [facility] on 3/2 in hemorrhagic shock and acute blood loss anemia. Her coagulopathy was reversed, she was then stabilized and moved back to [facility] on 3/5 to resume weaning efforts. Marland Kitchen    SIGNIFICANT EVENTS / STUDIES:  3/2 Given Vit K, FFP x 1, PRBC x 2  3/2 Seen by GI >>> No indication for EGD at this time  3/2 CT abdomen / pelvis >>> MODERATE RETROPERITONEAL HEMORRHAGE L psoas / iliacus muscle, adrenal mass, stable cystic adnexal changes   LINES / TUBES:  Trach 2/18 >>>  LUE PICC 2/20 >>>   CULTURES:  3/2 MRSA PCR >>> neg  3/2 Clostridium difficile PCR >>> neg  3/3 Blood >>>  3/3 Urine >>> mult morph>>>recullect  3/5 urine>>>   ANTIBIOTICS:  Vancomycin 3/3 >>>  Zosyn 3/3 >>>   HISTORY OF PRESENT ILLNESS:   80 y/o F admitted to [facility] 2/2 with norovirus gastroenteritis, respiratory failure, hypovolemic and presumed septic shock -source unclear. Course complicated by ARDS, AKI. 2/8 am suffered mucus plugging with ETT obstruction with subsequent cardiac arrest. Required CPR x 9 mins, improved with re-intubation & left pneumothorax decompression. Failed extubation on 2/18 due to severe deconditioning & hypoxia & required tstomy. Tx to [facility] on 2/20. Pt found to have L [place] DVT and was rx'd with lovenox / coumadin. 3/2 noted to have hemorrhagic shock, Tx back to MICU. Found to have large retroperitoneal bleed. INR was reversed with Vit K, FFP, blood products given. L PICC was removed and R PICC placed. Also noted leukocytosis, no specific source of infection found, but improved with abx which will continue for total 7 days. She remains on full vent  support but ready to continue attempts at weaning per [facility] protocol. NO further anticoagulation at this time.    PAST MEDICAL HISTORY :  Past Medical History  Diagnosis Date  . Hypothyroid   . Complete heart block 2002    Initial PPM 2002 with upgrade by JA to CRT-P 11/21/08  . Nonischemic cardiomyopathy   . Persistent atrial fibrillation   . Hypertension   . Hyperlipidemia   . Hives 4/10  . Cataract 4/13    right  . Renal failure     3rd stage-seeing neurologist   Past Surgical History  Procedure Laterality Date  . Pacemaker insertion  2002, 2010    PPM 2002, upgrade to CRT-P by Dr Johney Frame (MDT 2010)  . Partial colectomy  1/04  . Abdominal hysterectomy  1987    TAH  . Bunionectomy Right 1996  . Breast biopsy Left 01/1997  . Cataract extraction  4/13    both eyes  . Cyst excision      on back of left ear   Prior to Admission medications   Medication Sig Start Date End Date Taking? Authorizing Provider  acetaminophen (TYLENOL) 325 MG tablet Take 650 mg by mouth every 6 (six) hours as needed for mild pain or fever.    Historical Provider, MD  acetaminophen (TYLENOL) 650 MG suppository Place 650 mg rectally every 6 (six) hours as needed for mild pain or fever.    Historical Provider, MD  albuterol (PROVENTIL) (  2.5 MG/3ML) 0.083% nebulizer solution Take 2.5 mg by nebulization every 2 (two) hours as needed for wheezing or shortness of breath.    Historical Provider, MD  Amino Acids-Protein Hydrolys (FEEDING SUPPLEMENT, PRO-STAT SUGAR FREE 64,) LIQD Place 30 mLs into feeding tube daily. 05/05/13   Bernadene PersonKathryn A Whiteheart, NP  folic acid (FOLVITE) 1 MG tablet Take 1 mg by mouth daily.      Historical Provider, MD  insulin aspart (NOVOLOG) 100 UNIT/ML injection Inject 0-15 Units into the skin every 4 (four) hours. 05/05/13   Bernadene PersonKathryn A Whiteheart, NP  ipratropium-albuterol (DUONEB) 0.5-2.5 (3) MG/3ML SOLN Take 3 mLs by nebulization every 6 (six) hours. 04/22/13   Jeanella CrazeBrandi L Ollis, NP    levothyroxine (SYNTHROID, LEVOTHROID) 75 MCG tablet Place 0.5 tablets (37.5 mcg total) into feeding tube daily before breakfast. 05/06/13   Bernadene PersonKathryn A Whiteheart, NP  methylPREDNISolone sodium succinate (SOLU-MEDROL) 40 mg/mL injection Inject 1 mL (40 mg total) into the vein daily. 05/05/13   Bernadene PersonKathryn A Whiteheart, NP  morphine 2 MG/ML injection Inject 1 mL (2 mg total) into the vein every 3 (three) hours as needed. 05/05/13   Bernadene PersonKathryn A Whiteheart, NP  Nutritional Supplements (FEEDING SUPPLEMENT, VITAL 1.5 CAL,) LIQD Place 1,000 mLs into feeding tube daily. 05/05/13   Bernadene PersonKathryn A Whiteheart, NP  pantoprazole (PROTONIX) 40 MG injection Inject 40 mg into the vein daily. 05/05/13   Bernadene PersonKathryn A Whiteheart, NP  piperacillin-tazobactam (ZOSYN) 3.375 GM/50ML IVPB Inject 50 mLs (3.375 g total) into the vein every 8 (eight) hours. 05/05/13   Bernadene PersonKathryn A Whiteheart, NP  sertraline (ZOLOFT) 50 MG tablet Take 50 mg by mouth daily.    Historical Provider, MD  Vancomycin (VANCOCIN) 750 MG/150ML SOLN Inject 150 mLs (750 mg total) into the vein every 12 (twelve) hours. 05/05/13   Bernadene PersonKathryn A Whiteheart, NP   Allergies  Allergen Reactions  . Beta Adrenergic Blockers Other (See Comments)    Not working well for pt.  Sherrie Mustache. Bystolic [Nebivolol Hcl] Shortness Of Breath  . Carvedilol Shortness Of Breath    Chest  Discomfort.  . Dapsone Other (See Comments)    Kidney  failure  . Diclofenac Sodium Other (See Comments)    Causes  Fluid  Retention.  Consuelo Pandy. Livalo [Pitavastatin Calcium] Shortness Of Breath  . Methotrexate Shortness Of Breath    Mouth sores.  . Metoprolol Succinate Hives  . Oxaprozin Diarrhea  . Other     Fragrance mixtures -  Cinol botanicals  &  Plants oil. Cocamidoproy   -   Betaine. Lipstick & other makeup & cleaning materials.   Marland Kitchen. Epinephrine     FAMILY HISTORY:  Family History  Problem Relation Age of Onset  . Diabetes Maternal Aunt   . Breast cancer Maternal Aunt   . Stroke Mother   . Stroke Father   .  Hypothyroidism Sister   . Hypothyroidism Son   . Hypothyroidism Mother   . Hypertension Father   . Kidney disease Father     renal problems   SOCIAL HISTORY:  reports that she has never smoked. She has never used smokeless tobacco. She reports that she does not drink alcohol or use illicit drugs.  REVIEW OF SYSTEMS:   Unable    SUBJECTIVE:  No acute distress  VITAL SIGNS: Reviewed   PHYSICAL EXAMINATION:  General: Appears chronically ill, no respiratory distress  Neuro: Awake, alert, gesturing  HEENT: Tracheostomy site without bleeding / inflammation  Cardiovascular: RRR, no murmurs  Lungs: Bilateral air entry  equal. Scattered rhonchi.  Abdomen: Soft, non tender, bowel sounds present  Musculoskeletal: Anasarca, ecchymoses upper extremities bilaterally, L > R BUE edema    Recent Labs Lab 05/03/13 0200 05/04/13 0450 05/05/13 0429  NA 144 147 149*  K 4.1 3.4* 3.6*  CL 104 109 114*  CO2 27 26 25   BUN 65* 56* 45*  CREATININE 1.59* 1.17* 0.98  GLUCOSE 151* 134* 185*    Recent Labs Lab 05/05/13 0050 05/05/13 0429 05/05/13 0800  HGB 7.5* 7.4* 7.6*  HCT 22.2* 22.0* 23.0*  WBC 14.5* 14.9* 14.3*  PLT 120* 135* 134*   Ir Fluoro Guide Cv Line Right  05/04/2013   CLINICAL DATA:  ARDS  EXAM: Right PICC LINE PLACEMENT WITH ULTRASOUND AND FLUOROSCOPIC GUIDANCE  FLUOROSCOPY TIME:  60 seconds.  PROCEDURE: The patient was advised of the possible risks andcomplications and agreed to undergo the procedure. The patient was then brought to the angiographic suite for the procedure.  The right arm was prepped with chlorhexidine, drapedin the usual sterile fashion using maximum barrier technique (cap and mask, sterile gown, sterile gloves, large sterile sheet, hand hygiene and cutaneous antisepsis) and infiltrated locally with 1% Lidocaine.  Ultrasound demonstrated patency of the right basilic vein, and this was documented with an image. Under real-time ultrasound guidance, this vein was  accessed with a 21 gauge micropuncture needle and image documentation was performed. A 0.018 wire was introduced in to the vein. Over this, a 5 [place] double lumen Power PICC was advanced to the lower SVC/right atrial junction. Fluoroscopy during the procedure and fluoro spot radiograph confirms appropriate catheter position. The catheter was flushed and covered with asterile dressing.  Complications: None  IMPRESSION: Successful right arm Power PICC line placement with ultrasound and fluoroscopic guidance. The catheter is ready for use.   Electronically Signed   By: Maryclare Bean M.D.   On: 05/04/2013 13:58   Ir US Guide Vasc Access Right  05/04/2013   CLINICAL DATA:  ARDS  EXAM: Right PICC LINE PLACEMENT WITH ULTRASOUND AND FLUOROSCOPIC GUIDANCE  FLUOROSCOPY TIME:  60 seconds.  PROCEDURE: The patient was advised of the possible risks andcomplications and agreed to undergo the procedure. The patient was then brought to the angiographic suite for the procedure.  The right arm was prepped with chlorhexidine, drapedin the usual sterile fashion using maximum barrier technique (cap and mask, sterile gown, sterile gloves, large sterile sheet, hand hygiene and cutaneous antisepsis) and infiltrated locally with 1% Lidocaine.  Ultrasound demonstrated patency of the right basilic vein, and this was documented with an image. Under real-time ultrasound guidance, this vein was accessed with a 21 gauge micropuncture needle and image documentation was performed. A 0.018 wire was introduced in to the vein. Over this, a 5 [place] double lumen Power PICC was advanced to the lower SVC/right atrial junction. Fluoroscopy during the procedure and fluoro spot radiograph confirms appropriate catheter position. The catheter was flushed and covered with asterile dressing.  Complications: None  IMPRESSION: Successful right arm Power PICC line placement with ultrasound and fluoroscopic guidance. The catheter is ready for use.   Electronically  Signed   By: Maryclare Bean M.D.   On: 05/04/2013 13:58   Dg Abd Portable 1v  05/05/2013   CLINICAL DATA:  Nasogastric tube placement.  EXAM: PORTABLE ABDOMEN - 1 VIEW  COMPARISON:  IR FLUORO GUIDE CV LINE*R* dated 05/04/2013; DG ABD PORTABLE 1V dated 05/02/2013; CT ABD/PELV WO CM dated 05/02/2013  FINDINGS: Nasogastric tube is present with the tip  in the stomach. The nasogastric tube is doubled back upon itself and the tip is at the gastric cardia. Monitoring leads project over the abdomen and pelvis. Partial visualization of pacemaker leads.  IMPRESSION: Nasogastric tube tip at the cardia of the stomach.   Electronically Signed   By: Andreas Newport M.D.   On: 05/05/2013 19:25  3/4 L>R airspace disease  ASSESSMENT / PLAN: Acute on chronic respiratory failure - post ARDS pulmonary fibrosis - s/p trach  ?HCAP  Recommendations  Cont attempts at vent wean per protocol  F/u CXR  Day 2/7 abx Mobilize  F/u cultures   LUE DVT  Resolved Retroperitoneal bleed, w/ resultant hemorrhagic shock in setting anticoagulation for LUE DVT  Recommendations  Monitor cbc  NO anticoagulation  F/u coags   Pain LLE Plan: LE [place]  Hypernatremia Protein calorie malnutrition  Hyperthyroid  Diabetes  Deconditioning  recommendations  Defer to primary team.    Pulmonary and Critical Care Medicine [facility] Pager: [phone]  05/05/2013, 10:27 PM

## 2013-05-05 NOTE — Plan of Care (Signed)
Problem: Phase I Progression Outcomes Goal: Patient tolerating weaning plan Outcome: Completed/Met Date Met:  05/05/13 Patient able to wean  On the ventilator today 05/05/13.

## 2013-05-05 NOTE — Plan of Care (Signed)
Problem: Phase I Progression Outcomes Goal: Voiding-avoid urinary catheter unless indicated Outcome: Not Applicable Date Met:  93/81/01 Foley cath in place

## 2013-05-05 NOTE — Plan of Care (Signed)
Problem: Phase I Progression Outcomes Goal: Initial discharge plan identified Outcome: Completed/Met Date Met:  05/05/13 Plan to discharge to [facility] today 05/05/13

## 2013-05-05 NOTE — Plan of Care (Signed)
Problem: Consults Goal: Ventilated Patients Patient Education See Patient Education Module for education specifics.  Outcome: Completed/Met Date Met:  05/05/13 Family and patient educated about the weaning process on the vent

## 2013-05-05 NOTE — Plan of Care (Signed)
Problem: Consults Goal: Diabetes Guidelines if Diabetic/Glucose > 140 If diabetic or lab glucose is > 140 mg/dl - Initiate Diabetes/Hyperglycemia Guidelines & Document Interventions  Outcome: Completed/Met Date Met:  05/05/13 Patient and family educated

## 2013-05-06 ENCOUNTER — Other Ambulatory Visit (HOSPITAL_COMMUNITY): Payer: Self-pay

## 2013-05-06 DIAGNOSIS — M79609 Pain in unspecified limb: Secondary | ICD-10-CM

## 2013-05-06 LAB — BLOOD GAS, ARTERIAL
ACID-BASE EXCESS: 1.3 mmol/L (ref 0.0–2.0)
BICARBONATE: 24.9 meq/L — AB (ref 20.0–24.0)
FIO2: 0.4 %
LHR: 22 {breaths}/min
MECHVT: 500 mL
O2 SAT: 100 %
PEEP: 5 cmH2O
PO2 ART: 125 mmHg — AB (ref 80.0–100.0)
Patient temperature: 98.6
TCO2: 26 mmol/L (ref 0–100)
pCO2 arterial: 36 mmHg (ref 35.0–45.0)
pH, Arterial: 7.454 — ABNORMAL HIGH (ref 7.350–7.450)

## 2013-05-06 LAB — BASIC METABOLIC PANEL
BUN: 35 mg/dL — ABNORMAL HIGH (ref 6–23)
CALCIUM: 7.4 mg/dL — AB (ref 8.4–10.5)
CO2: 26 mEq/L (ref 19–32)
Chloride: 119 mEq/L — ABNORMAL HIGH (ref 96–112)
Creatinine, Ser: 0.82 mg/dL (ref 0.50–1.10)
GFR, EST AFRICAN AMERICAN: 77 mL/min — AB (ref >90)
GFR, EST NON AFRICAN AMERICAN: 67 mL/min — AB (ref >90)
Glucose, Bld: 232 mg/dL — ABNORMAL HIGH (ref 70–99)
Potassium: 3.6 mEq/L — ABNORMAL LOW (ref 3.7–5.3)
SODIUM: 152 meq/L — AB (ref 137–147)

## 2013-05-06 LAB — URINE CULTURE
COLONY COUNT: NO GROWTH
Culture: NO GROWTH

## 2013-05-06 LAB — CBC
HCT: 23.4 % — ABNORMAL LOW (ref 36.0–46.0)
Hemoglobin: 7.4 g/dL — ABNORMAL LOW (ref 12.0–15.0)
MCH: 30.3 pg (ref 26.0–34.0)
MCHC: 31.6 g/dL (ref 30.0–36.0)
MCV: 95.9 fL (ref 78.0–100.0)
RBC: 2.44 MIL/uL — AB (ref 3.87–5.11)
RDW: 20.3 % — AB (ref 11.5–15.5)
WBC: 11 10*3/uL — ABNORMAL HIGH (ref 4.0–10.5)

## 2013-05-06 LAB — VANCOMYCIN, TROUGH: Vancomycin Tr: 9.6 ug/mL — ABNORMAL LOW (ref 10.0–20.0)

## 2013-05-06 NOTE — Progress Notes (Signed)
*  PRELIMINARY RESULTS* Vascular Ultrasound Lower extremity venous duplex has been completed.  Preliminary findings: Bilateral:  No evidence of DVT, superficial thrombosis, or Baker's Cyst.    Farrel Demark, RDMS, RVT  05/06/2013, 11:59 AM

## 2013-05-06 NOTE — Progress Notes (Signed)
[facility]                                                                                              Progress note     Patient Demographics  [name], is a 80 y.o. female  ZOX:[id number]  WJX:[id number]  DOB - 06/17/1932  Admit date - 05/05/2013  Admitting Physician Carron CurieAli Helmuth Recupero, MD  Outpatient Primary MD for the patient is Astrid DivineGRIFFIN,ELAINE COLLINS, MD  LOS - 1   CC : Respiratory failure          Norovirus         Retroperitoneal hematoma         DVT                                                                                                                                                                                                         Subjective:   Angela NorrisShirley Fitzgerald denies chest pains shortness of breath nausea vomiting or diarrhea, wants to go home 9T75  Objective:   Vital signs  Temperature Heart rate 70 Respiratory rate 21 Blood pressure 102/50 Pulse ox 98%    Exam Awake Alert, , No new F.N deficits,  [place].AT,PERRAL NG tube noted in place Supple Neck,No JVD, No cervical lymphadenopathy appriciated. Tracheostomy noted  Symmetrical Chest wall movement, decreased breath sounds bilaterally RRR,No Gallops,Rubs or new Murmurs, No Parasternal Heave +ve B.Sounds, Abd Soft, mild generalized tenderness, No organomegaly appriciated, No rebound - guarding or rigidity. No Cyanosis, Clubbing or edema, No new Rash or bruise     I&Os 679/725  Weight 172 pounds  Foley yes  Trach yes  Data Review   CBC  Recent Labs Lab 05/04/13 1942 05/05/13 0050 05/05/13 0429 05/05/13 0800 05/06/13 0650  WBC 14.9* 14.5* 14.9* 14.3* 11.0*  HGB 7.3* 7.5* 7.4* 7.6* 7.4*  HCT 22.3* 22.2* 22.0* 23.0* 23.4*  PLT 124* 120* 135* 134* PLATELET CLUMPING, SUGGEST RECOLLECTION OF SAMPLE IN CITRATE TUBE.  MCV 91.0 91.7 92.8 92.7 95.9  MCH 29.8 31.0 31.2 30.6 30.3  MCHC 32.7 33.8 33.6 33.0  31.6  RDW 18.8* 19.0* 19.3* 19.3* 20.3*    Chemistries   Recent Labs  Lab 05/02/13 0500 05/03/13 0200 05/04/13 0450 05/04/13 1201 05/04/13 1342 05/05/13 0048 05/05/13 0429 05/05/13 1224 05/06/13 0650  NA 140 144 147  --   --   --  149*  --  152*  K 3.1* 4.1 3.4*  --   --   --  3.6*  --  3.6*  CL 97 104 109  --   --   --  114*  --  119*  CO2 26 27 26   --   --   --  25  --  26  GLUCOSE 264* 151* 134*  --   --   --  185*  --  232*  BUN 54* 65* 56*  --   --   --  45*  --  35*  CREATININE 1.20* 1.59* 1.17*  --   --   --  0.98  --  0.82  CALCIUM 7.9* 7.6* 7.6*  --   --   --  7.3*  --  7.4*  MG  --  2.5  --  2.3  --  2.6*  --  2.5  --   AST  --  334*  --   --  104*  --   --   --   --   ALT  --  354*  --   --  230*  --   --   --   --   ALKPHOS  --  63  --   --  56  --   --   --   --   BILITOT  --  1.2  --   --  0.9  --   --   --   --    Coagulation profile  Recent Labs Lab 04/30/13 0500 05/01/13 0500 05/02/13 0500 05/03/13 0141 05/03/13 1555  INR 1.41 1.89* 2.89* 1.63* 1.05    No results found for this basename: DDIMER,  in the last 72 hours  Cardiac Enzymes No results found for this basename: CK, CKMB, TROPONINI, MYOGLOBIN,  in the last 168 hours ------------------------------------------------------------------------------------------------------------------ No components found with this basename: POCBNP,   Micro Results Recent Results (from the past 240 hour(s))  CLOSTRIDIUM DIFFICILE BY PCR     Status: None   Collection Time    05/02/13  4:00 PM      Result Value Ref Range Status   C difficile by pcr NEGATIVE  NEGATIVE Final  MRSA PCR SCREENING     Status: None   Collection Time    05/02/13  6:02 PM      Result Value Ref Range Status   MRSA by PCR NEGATIVE  NEGATIVE Final   Comment:            The GeneXpert MRSA Assay (FDA     approved for NASAL specimens     only), is one component of a     comprehensive MRSA colonization     surveillance program. It is  not     intended to diagnose MRSA     infection nor to guide or     monitor treatment for     MRSA infections.  CULTURE, BLOOD (ROUTINE X 2)     Status: None   Collection Time    05/03/13  2:40 PM      Result Value Ref Range Status   Specimen Description BLOOD RIGHT HAND   Final   Special Requests BOTTLES DRAWN AEROBIC ONLY 2CC   Final   Culture  Setup Time  Final   Value: 05/03/2013 22:32     Performed at [facility]   Culture     Final   Value:        BLOOD CULTURE RECEIVED NO GROWTH TO DATE CULTURE WILL BE HELD FOR 5 DAYS BEFORE ISSUING A FINAL NEGATIVE REPORT     Performed at [facility]   Report Status PENDING   Incomplete  CULTURE, BLOOD (ROUTINE X 2)     Status: None   Collection Time    05/03/13  3:55 PM      Result Value Ref Range Status   Specimen Description BLOOD RIGHT FOOT   Final   Special Requests BOTTLES DRAWN AEROBIC ONLY 3CC   Final   Culture  Setup Time     Final   Value: 05/04/2013 01:11     Performed at [facility]   Culture     Final   Value:        BLOOD CULTURE RECEIVED NO GROWTH TO DATE CULTURE WILL BE HELD FOR 5 DAYS BEFORE ISSUING A FINAL NEGATIVE REPORT     Performed at [facility]   Report Status PENDING   Incomplete  URINE CULTURE     Status: None   Collection Time    05/03/13  4:00 PM      Result Value Ref Range Status   Specimen Description URINE, CATHETERIZED   Final   Special Requests NONE   Final   Culture  Setup Time     Final   Value: 05/03/2013 16:11     Performed at [facility] Count     Final   Value: 30,000 COLONIES/ML     Performed at [facility]   Culture     Final   Value: Multiple bacterial morphotypes present, none predominant. Suggest appropriate recollection if clinically indicated.     Performed at [facility]   Report Status 05/04/2013 FINAL   Final       Assessment & Plan  Respiratory failure; continue with PSV trials. Hemorrhagic shock;  reversed by fresh frozen plasma vitamin K and blood transfusion last hemoglobin was 7.6 Left pneumothorax status post chest tube placement SIRS continue with IV vancomycin and Zosyn Hypernatremia; start D5 water/increase H2O per tube Hypertension A. fib controlled rate at 71 Protein calorie malnutrition; continue with Vital 1.5 the NG tube Generalized weakness; PT OT to evaluate in  [place] Retroperitoneal bleed History of norovirus Diarrhea; place rectal tube and start Benefiber and Lomotil Lower extremity pains check Dopplers  Critical care time 34 minutes  Code Status:  full  Family Communication: Discussed with family who was at bedside  Consults  PCCM. and ID  Antibiotics vancomycin and Zosyn  DVT Prophylaxis  SCDs   Carron Curie M.D on 05/06/2013 at 2:15 PM

## 2013-05-07 LAB — BASIC METABOLIC PANEL
BUN: 28 mg/dL — ABNORMAL HIGH (ref 6–23)
CO2: 26 mEq/L (ref 19–32)
Calcium: 7.8 mg/dL — ABNORMAL LOW (ref 8.4–10.5)
Chloride: 120 mEq/L — ABNORMAL HIGH (ref 96–112)
Creatinine, Ser: 0.73 mg/dL (ref 0.50–1.10)
GFR calc Af Amer: >90 mL/min (ref >90)
GFR calc non Af Amer: 80 mL/min — ABNORMAL LOW (ref >90)
Glucose, Bld: 210 mg/dL — ABNORMAL HIGH (ref 70–99)
Potassium: 3.6 mEq/L — ABNORMAL LOW (ref 3.7–5.3)
Sodium: 154 mEq/L — ABNORMAL HIGH (ref 137–147)

## 2013-05-07 LAB — HEMOGLOBIN AND HEMATOCRIT, BLOOD
HEMATOCRIT: 24.9 % — AB (ref 36.0–46.0)
HEMOGLOBIN: 7.7 g/dL — AB (ref 12.0–15.0)

## 2013-05-07 NOTE — Progress Notes (Signed)
[facility]                                                                                              Progress note     Patient Demographics  [name], is a 80 y.o. female  ZOX:[id number]  WJX:[id number]  DOB - 1932/09/21  Admit date - 05/05/2013  Admitting Physician Carron Curie, MD  Outpatient Primary MD for the patient is [name], MD  LOS - 2   CC : Respiratory failure          Norovirus         Retroperitoneal hematoma         DVT                                                                                                                                                                                                         Subjective:   [name] denies chest pains shortness of breath nausea vomiting or diarrhea, wants to go home 9T75  Objective:   Vital signs  Temperature 97 4 Heart rate 72 Respiratory rate 26 Blood pressure 156/65 Pulse ox 100%    Exam Awake Alert, , No new F.N deficits,  [place].AT,PERRAL NG tube noted in place Supple Neck,No JVD, No cervical lymphadenopathy appriciated. Tracheostomy noted  Symmetrical Chest wall movement, decreased breath sounds bilaterally RRR,No Gallops,Rubs or new Murmurs, No Parasternal Heave +ve B.Sounds, Abd Soft, mild generalized tenderness, No organomegaly appriciated, No rebound - guarding or rigidity. No Cyanosis, Clubbing or edema, No new Rash or bruise     I&Os 2135/1650  Weight 172 pounds  Foley yes  Trach yes  Data Review   CBC  Recent Labs Lab 05/04/13 1942 05/05/13 0050 05/05/13 0429 05/05/13 0800 05/06/13 0650 05/07/13 0445  WBC 14.9* 14.5* 14.9* 14.3* 11.0*  --   HGB 7.3* 7.5* 7.4* 7.6* 7.4* 7.7*  HCT 22.3* 22.2* 22.0* 23.0* 23.4* 24.9*  PLT 124* 120* 135* 134* PLATELET CLUMPING, SUGGEST RECOLLECTION OF SAMPLE IN CITRATE TUBE.  --   MCV 91.0 91.7 92.8 92.7 95.9  --   MCH  29.8 31.0 31.2 30.6 30.3  --   MCHC 32.7 33.8  33.6 33.0 31.6  --   RDW 18.8* 19.0* 19.3* 19.3* 20.3*  --     Chemistries   Recent Labs Lab 05/03/13 0200 05/04/13 0450 05/04/13 1201 05/04/13 1342 05/05/13 0048 05/05/13 0429 05/05/13 1224 05/06/13 0650 05/07/13 0445  NA 144 147  --   --   --  149*  --  152* 154*  K 4.1 3.4*  --   --   --  3.6*  --  3.6* 3.6*  CL 104 109  --   --   --  114*  --  119* 120*  CO2 27 26  --   --   --  25  --  26 26  GLUCOSE 151* 134*  --   --   --  185*  --  232* 210*  BUN 65* 56*  --   --   --  45*  --  35* 28*  CREATININE 1.59* 1.17*  --   --   --  0.98  --  0.82 0.73  CALCIUM 7.6* 7.6*  --   --   --  7.3*  --  7.4* 7.8*  MG 2.5  --  2.3  --  2.6*  --  2.5  --   --   AST 334*  --   --  104*  --   --   --   --   --   ALT 354*  --   --  230*  --   --   --   --   --   ALKPHOS 63  --   --  56  --   --   --   --   --   BILITOT 1.2  --   --  0.9  --   --   --   --   --    Coagulation profile  Recent Labs Lab 05/01/13 0500 05/02/13 0500 05/03/13 0141 05/03/13 1555  INR 1.89* 2.89* 1.63* 1.05    No results found for this basename: DDIMER,  in the last 72 hours  Cardiac Enzymes No results found for this basename: CK, CKMB, TROPONINI, MYOGLOBIN,  in the last 168 hours ------------------------------------------------------------------------------------------------------------------ No components found with this basename: POCBNP,   Micro Results Recent Results (from the past 240 hour(s))  CLOSTRIDIUM DIFFICILE BY PCR     Status: None   Collection Time    05/02/13  4:00 PM      Result Value Ref Range Status   C difficile by pcr NEGATIVE  NEGATIVE Final  MRSA PCR SCREENING     Status: None   Collection Time    05/02/13  6:02 PM      Result Value Ref Range Status   MRSA by PCR NEGATIVE  NEGATIVE Final   Comment:            The GeneXpert MRSA Assay (FDA     approved for NASAL specimens     only), is one component of a     comprehensive  MRSA colonization     surveillance program. It is not     intended to diagnose MRSA     infection nor to guide or     monitor treatment for     MRSA infections.  CULTURE, BLOOD (ROUTINE X 2)     Status: None   Collection Time    05/03/13  2:40 PM      Result Value Ref Range Status   Specimen Description BLOOD RIGHT HAND  Final   Special Requests BOTTLES DRAWN AEROBIC ONLY 2CC   Final   Culture  Setup Time     Final   Value: 05/03/2013 22:32     Performed at [facility]   Culture     Final   Value:        BLOOD CULTURE RECEIVED NO GROWTH TO DATE CULTURE WILL BE HELD FOR 5 DAYS BEFORE ISSUING A FINAL NEGATIVE REPORT     Performed at [facility]   Report Status PENDING   Incomplete  CULTURE, BLOOD (ROUTINE X 2)     Status: None   Collection Time    05/03/13  3:55 PM      Result Value Ref Range Status   Specimen Description BLOOD RIGHT FOOT   Final   Special Requests BOTTLES DRAWN AEROBIC ONLY 3CC   Final   Culture  Setup Time     Final   Value: 05/04/2013 01:11     Performed at [facility]   Culture     Final   Value:        BLOOD CULTURE RECEIVED NO GROWTH TO DATE CULTURE WILL BE HELD FOR 5 DAYS BEFORE ISSUING A FINAL NEGATIVE REPORT     Performed at [facility]   Report Status PENDING   Incomplete  URINE CULTURE     Status: None   Collection Time    05/03/13  4:00 PM      Result Value Ref Range Status   Specimen Description URINE, CATHETERIZED   Final   Special Requests NONE   Final   Culture  Setup Time     Final   Value: 05/03/2013 16:11     Performed at [facility] Count     Final   Value: 30,000 COLONIES/ML     Performed at [facility]   Culture     Final   Value: Multiple bacterial morphotypes present, none predominant. Suggest appropriate recollection if clinically indicated.     Performed at [facility]   Report Status 05/04/2013 FINAL   Final  URINE CULTURE     Status: None   Collection  Time    05/05/13  3:02 PM      Result Value Ref Range Status   Specimen Description URINE, CATHETERIZED   Final   Special Requests NONE   Final   Culture  Setup Time     Final   Value: 05/05/2013 22:41     Performed at [facility]   Colony Count     Final   Value: NO GROWTH     Performed at [facility]   Culture     Final   Value: NO GROWTH     Performed at [facility]   Report Status 05/06/2013 FINAL   Final       Assessment & Plan  Respiratory failure; continue with PSV trials. Hemorrhagic shock; reversed by fresh frozen plasma vitamin K and blood transfusion last hemoglobin was 7.6 Left pneumothorax status post chest tube placement SIRS continue with IV vancomycin and Zosyn Hypernatremia; start D5 water/increase H2O per tube Hypertension A. fib controlled rate at 71 Protein calorie malnutrition; continue with Vital 1.5 the NG tube Generalized weakness; PT OT to evaluate in  [place] Retroperitoneal bleed History of norovirus Diarrhea; place rectal tube and start Benefiber and Lomotil Lower extremity Dopplers were negative so SCDs were started Plan  pacemaker check-family concern Continue with  PSV stage I BMP in a.m.   Critical care time 34 minutes  Code Status:  full  Family Communication: Discussed with family who was at bedside  Consults  PCCM. and ID  Antibiotics vancomycin and Zosyn  DVT Prophylaxis  SCDs   Carron CurieHijazi, Joshlyn Beadle M.D on 05/07/2013 at 1:15 PM

## 2013-05-08 LAB — BASIC METABOLIC PANEL
BUN: 26 mg/dL — ABNORMAL HIGH (ref 6–23)
CHLORIDE: 115 meq/L — AB (ref 96–112)
CO2: 24 mEq/L (ref 19–32)
Calcium: 7.9 mg/dL — ABNORMAL LOW (ref 8.4–10.5)
Creatinine, Ser: 0.69 mg/dL (ref 0.50–1.10)
GFR calc non Af Amer: 81 mL/min — ABNORMAL LOW (ref >90)
Glucose, Bld: 109 mg/dL — ABNORMAL HIGH (ref 70–99)
POTASSIUM: 3.4 meq/L — AB (ref 3.7–5.3)
Sodium: 149 mEq/L — ABNORMAL HIGH (ref 137–147)

## 2013-05-08 NOTE — Progress Notes (Signed)
[facility]                                                                                              Progress note     Patient Demographics  [name], is a 80 y.o. female  ZOX:[id number]  WJX:[id number]  DOB - 03/30/1932  Admit date - 05/05/2013  Admitting Physician Carron CurieAli Kathrin Folden, MD  Outpatient Primary MD for the patient is Astrid DivineGRIFFIN,ELAINE COLLINS, MD  LOS - 3   CC : Respiratory failure          Norovirus         Retroperitoneal hematoma         DVT                                                                                                                                                                                                         Subjective:   Angela NorrisShirley Enochs denies chest pains shortness of breath nausea vomiting or diarrhea, wants to go home   Objective:   Vital signs  Temperature 99.1 Heart rate 74 Respiratory rate 27 Blood pressure 149/63 Pulse ox 100%    Exam Awake Alert, , No new F.N deficits,  [place].AT,PERRAL NG tube noted in place Supple Neck,No JVD, No cervical lymphadenopathy appriciated. Tracheostomy noted  Symmetrical Chest wall movement, decreased breath sounds bilaterally RRR,No Gallops,Rubs or new Murmurs, No Parasternal Heave +ve B.Sounds, Abd Soft, mild generalized tenderness, No organomegaly appriciated, No rebound - guarding or rigidity. No Cyanosis, Clubbing or edema, No new Rash or bruise     I&Os 1623/2450 Foley yes Trach yes  Data Review   CBC  Recent Labs Lab 05/04/13 1942 05/05/13 0050 05/05/13 0429 05/05/13 0800 05/06/13 0650 05/07/13 0445  WBC 14.9* 14.5* 14.9* 14.3* 11.0*  --   HGB 7.3* 7.5* 7.4* 7.6* 7.4* 7.7*  HCT 22.3* 22.2* 22.0* 23.0* 23.4* 24.9*  PLT 124* 120* 135* 134* PLATELET CLUMPING, SUGGEST RECOLLECTION OF SAMPLE IN CITRATE TUBE.  --   MCV 91.0 91.7 92.8 92.7 95.9  --   MCH 29.8 31.0 31.2 30.6 30.3  --    MCHC 32.7 33.8 33.6 33.0 31.6  --  RDW 18.8* 19.0* 19.3* 19.3* 20.3*  --     Chemistries   Recent Labs Lab 05/03/13 0200 05/04/13 0450 05/04/13 1201 05/04/13 1342 05/05/13 0048 05/05/13 0429 05/05/13 1224 05/06/13 0650 05/07/13 0445 05/08/13 0500  NA 144 147  --   --   --  149*  --  152* 154* 149*  K 4.1 3.4*  --   --   --  3.6*  --  3.6* 3.6* 3.4*  CL 104 109  --   --   --  114*  --  119* 120* 115*  CO2 27 26  --   --   --  25  --  26 26 24   GLUCOSE 151* 134*  --   --   --  185*  --  232* 210* 109*  BUN 65* 56*  --   --   --  45*  --  35* 28* 26*  CREATININE 1.59* 1.17*  --   --   --  0.98  --  0.82 0.73 0.69  CALCIUM 7.6* 7.6*  --   --   --  7.3*  --  7.4* 7.8* 7.9*  MG 2.5  --  2.3  --  2.6*  --  2.5  --   --   --   AST 334*  --   --  104*  --   --   --   --   --   --   ALT 354*  --   --  230*  --   --   --   --   --   --   ALKPHOS 63  --   --  56  --   --   --   --   --   --   BILITOT 1.2  --   --  0.9  --   --   --   --   --   --    Coagulation profile  Recent Labs Lab 05/02/13 0500 05/03/13 0141 05/03/13 1555  INR 2.89* 1.63* 1.05     Assessment & Plan  Respiratory failure; continue with PSV trials, tolerated 6-1/2 hours today. Hemorrhagic shock; reversed by fresh frozen plasma vitamin K and blood transfusion last hemoglobin was 7.6 Left pneumothorax status post chest tube placement SIRS continue with IV vancomycin and Zosyn Hypernatremia; start D5 water/increase H2O per tube Hypertension A. fib controlled rate at 71 Protein calorie malnutrition; continue with Vital 1.5 the NG tube Generalized weakness; PT OT to evaluate in  [place] Retroperitoneal bleed History of norovirus Diarrhea; place rectal tube and start Benefiber and Lomotil Lower extremity Dopplers were negative so SCDs were started Plan  pacemaker check-family concerned, tomorrow Continue with PSV trials Labs in a.m.   Critical care time 33 minutes  Code Status:  full  Family  Communication: Discussed with family who was at bedside  Consults  PCCM. and ID  Antibiotics vancomycin and Zosyn  DVT Prophylaxis  SCDs   Carron Curie M.D on 05/08/2013 at 3:06 PM

## 2013-05-09 ENCOUNTER — Other Ambulatory Visit (HOSPITAL_COMMUNITY): Payer: Self-pay

## 2013-05-09 DIAGNOSIS — J9620 Acute and chronic respiratory failure, unspecified whether with hypoxia or hypercapnia: Secondary | ICD-10-CM

## 2013-05-09 LAB — CULTURE, BLOOD (ROUTINE X 2): Culture: NO GROWTH

## 2013-05-09 LAB — BASIC METABOLIC PANEL
BUN: 24 mg/dL — ABNORMAL HIGH (ref 6–23)
CO2: 24 mEq/L (ref 19–32)
Calcium: 7.8 mg/dL — ABNORMAL LOW (ref 8.4–10.5)
Chloride: 117 mEq/L — ABNORMAL HIGH (ref 96–112)
Creatinine, Ser: 0.67 mg/dL (ref 0.50–1.10)
GFR, EST NON AFRICAN AMERICAN: 81 mL/min — AB (ref >90)
GLUCOSE: 146 mg/dL — AB (ref 70–99)
POTASSIUM: 3.5 meq/L — AB (ref 3.7–5.3)
SODIUM: 151 meq/L — AB (ref 137–147)

## 2013-05-09 LAB — CBC
HEMATOCRIT: 26.7 % — AB (ref 36.0–46.0)
HEMOGLOBIN: 8.1 g/dL — AB (ref 12.0–15.0)
MCH: 30.1 pg (ref 26.0–34.0)
MCHC: 30.3 g/dL (ref 30.0–36.0)
MCV: 99.3 fL (ref 78.0–100.0)
RBC: 2.69 MIL/uL — ABNORMAL LOW (ref 3.87–5.11)
RDW: 20.5 % — AB (ref 11.5–15.5)
WBC: 8.2 10*3/uL (ref 4.0–10.5)

## 2013-05-09 LAB — URINALYSIS, ROUTINE W REFLEX MICROSCOPIC
BILIRUBIN URINE: NEGATIVE
Glucose, UA: 250 mg/dL — AB
Ketones, ur: NEGATIVE mg/dL
NITRITE: NEGATIVE
Protein, ur: 30 mg/dL — AB
SPECIFIC GRAVITY, URINE: 1.027 (ref 1.005–1.030)
UROBILINOGEN UA: 0.2 mg/dL (ref 0.0–1.0)
pH: 5.5 (ref 5.0–8.0)

## 2013-05-09 LAB — URINE MICROSCOPIC-ADD ON

## 2013-05-09 LAB — VANCOMYCIN, TROUGH: Vancomycin Tr: 14.8 ug/mL (ref 10.0–20.0)

## 2013-05-09 LAB — NOROVIRUS GROUP 1 & 2 BY PCR, STOOL

## 2013-05-09 NOTE — Progress Notes (Signed)
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 Her coagulopathy was reversed, she was then stabilized and moved back to [facility] on 3/5 to resume weaning efforts. Marland Kitchen    SIGNIFICANT EVENTS / STUDIES:  3/2 Given Vit K, FFP x 1, PRBC x 2  3/2 Seen by GI >>> No indication for EGD at this time  3/2 CT abdomen / pelvis >>> MODERATE RETROPERITONEAL HEMORRHAGE L psoas / iliacus muscle, adrenal mass, stable cystic adnexal changes   LINES / TUBES:  Trach 2/18 >>>  LUE PICC 2/20 >>>   CULTURES:  3/2 MRSA PCR >>> neg  3/2 Clostridium difficile PCR >>> neg  3/3 Blood >>>  3/3 Urine >>> mult morph>>>recullect  3/9 urine>>>   ANTIBIOTICS:  Vancomycin 3/3 >>>  Zosyn 3/3 >>>     SUBJECTIVE:  No acute distress   VITAL SIGNS: Reviewed   PHYSICAL EXAMINATION:  General: Appears chronically ill, no respiratory distress  Neuro: Awake, alert, nods yes/no HEENT: Tracheostomy site without bleeding / inflammation  Cardiovascular: RRR, no murmurs  Lungs: Bilateral air entry equal. Scattered rhonchi.  Abdomen: Soft, non tender, bowel sounds present  Musculoskeletal: Anasarca, ecchymoses upper extremities bilaterally, L > R BUE edema    Recent Labs Lab 05/07/13 0445 05/08/13 0500 05/09/13 0600  NA 154* 149* 151*  K 3.6* 3.4* 3.5*  CL 120* 115* 117*  CO2 26 24 24   BUN 28* 26* 24*  CREATININE 0.73 0.69 0.67  GLUCOSE 210* 109* 146*    Recent Labs Lab 05/05/13 0800 05/06/13 0650 05/07/13 0445 05/09/13 0600  HGB 7.6* 7.4* 7.7* 8.1*  HCT 23.0* 23.4* 24.9* 26.7*  WBC 14.3* 11.0*  --  8.2  PLT 134* PLATELET CLUMPING, SUGGEST  RECOLLECTION OF SAMPLE IN CITRATE TUBE.  --  PLATELET CLUMPING, SUGGEST RECOLLECTION OF SAMPLE IN CITRATE TUBE.   Dg Chest Port 1 View  05/09/2013   CLINICAL DATA:  Respiratory failure  EXAM: PORTABLE CHEST - 1 VIEW  COMPARISON:  May 06, 2013  FINDINGS: Patchy predominantly interstitial infiltrate throughout the left lung is stable. There is mild atelectatic change in the right upper lobe. Right lung is otherwise clear. Heart size and pulmonary vascularity are normal. Tracheostomy is well seated. Central catheter tip is at the cavoatrial junction. Pacemaker leads are attached to the right and left heart, stable. No adenopathy.  IMPRESSION: No change from recent prior study. Extensive predominantly interstitial infiltrate throughout left lung. Mild atelectatic change right upper lobe. No new opacity. No pneumothorax.   Electronically Signed   By: Bretta Bang M.D.   On: 05/09/2013 08:14    ASSESSMENT / PLAN: Acute on chronic respiratory failure - post ARDS pulmonary fibrosis - s/p trach  ?HCAP  Recommendations  Cont attempts at vent wean per protocol  F/u CXR  Day 7/7 abx Mobilize  F/u cultures   LUE DVT  Resolved Retroperitoneal bleed, w/ resultant hemorrhagic shock in setting anticoagulation for LUE DVT  Recommendations  Monitor cbc  NO anticoagulation  F/u coags    Hypernatremia Protein calorie malnutrition  Hyperthyroid  Diabetes  Deconditioning  recommendations  Defer to primary team.  Danford BadWHITEHEART,KATHRYN, NP 05/09/2013  10:05 AM Pager: [phone] or [phone]  *Care during the described time interval was provided by me and/or other providers on the critical care team. I have reviewed this patient's available data, including medical history, events of note, physical examination and test results as part of my evaluation.  Levy Pupaobert Makayli Bracken, MD, PhD 05/09/2013, 11:33 AM [facility] [phone] or if no answer [phone]

## 2013-05-10 LAB — CBC
HEMATOCRIT: 25.6 % — AB (ref 36.0–46.0)
Hemoglobin: 7.8 g/dL — ABNORMAL LOW (ref 12.0–15.0)
MCH: 30.5 pg (ref 26.0–34.0)
MCHC: 30.5 g/dL (ref 30.0–36.0)
MCV: 100 fL (ref 78.0–100.0)
RBC: 2.56 MIL/uL — ABNORMAL LOW (ref 3.87–5.11)
RDW: 20.5 % — AB (ref 11.5–15.5)
WBC: 7 10*3/uL (ref 4.0–10.5)

## 2013-05-10 LAB — BASIC METABOLIC PANEL
BUN: 23 mg/dL (ref 6–23)
CHLORIDE: 114 meq/L — AB (ref 96–112)
CO2: 23 mEq/L (ref 19–32)
Calcium: 7.1 mg/dL — ABNORMAL LOW (ref 8.4–10.5)
Creatinine, Ser: 0.65 mg/dL (ref 0.50–1.10)
GFR calc non Af Amer: 82 mL/min — ABNORMAL LOW (ref >90)
Glucose, Bld: 206 mg/dL — ABNORMAL HIGH (ref 70–99)
POTASSIUM: 4.3 meq/L (ref 3.7–5.3)
SODIUM: 147 meq/L (ref 137–147)

## 2013-05-10 LAB — CULTURE, BLOOD (ROUTINE X 2): CULTURE: NO GROWTH

## 2013-05-10 LAB — URINE CULTURE
Colony Count: >=100000
Special Requests: NORMAL

## 2013-05-12 ENCOUNTER — Encounter: Payer: Self-pay | Admitting: Radiology

## 2013-05-12 ENCOUNTER — Other Ambulatory Visit (HOSPITAL_COMMUNITY): Payer: Self-pay

## 2013-05-12 LAB — BASIC METABOLIC PANEL
BUN: 19 mg/dL (ref 6–23)
BUN: 23 mg/dL (ref 6–23)
CHLORIDE: 105 meq/L (ref 96–112)
CO2: 23 mEq/L (ref 19–32)
CO2: 24 meq/L (ref 19–32)
Calcium: 7.4 mg/dL — ABNORMAL LOW (ref 8.4–10.5)
Calcium: 7.1 mg/dL — ABNORMAL LOW (ref 8.4–10.5)
Chloride: 110 mEq/L (ref 96–112)
Creatinine, Ser: 0.75 mg/dL (ref 0.50–1.10)
Creatinine, Ser: 0.74 mg/dL (ref 0.50–1.10)
GFR calc Af Amer: >90 mL/min (ref >90)
GFR calc non Af Amer: 78 mL/min — ABNORMAL LOW (ref >90)
GFR calc non Af Amer: 79 mL/min — ABNORMAL LOW (ref >90)
GLUCOSE: 93 mg/dL (ref 70–99)
Glucose, Bld: 199 mg/dL — ABNORMAL HIGH (ref 70–99)
POTASSIUM: 3.3 meq/L — AB (ref 3.7–5.3)
Potassium: 3.2 mEq/L — ABNORMAL LOW (ref 3.7–5.3)
SODIUM: 142 meq/L (ref 137–147)
Sodium: 144 mEq/L (ref 137–147)

## 2013-05-12 LAB — BLOOD GAS, ARTERIAL
ACID-BASE EXCESS: 0 mmol/L (ref 0.0–2.0)
Acid-base deficit: 1.6 mmol/L (ref 0.0–2.0)
BICARBONATE: 21.7 meq/L (ref 20.0–24.0)
Bicarbonate: 23.4 mEq/L (ref 20.0–24.0)
Drawn by: 270221
FIO2: 0.4 %
FIO2: 1 %
MECHVT: 500 mL
MECHVT: 500 mL
O2 SAT: 94.6 %
O2 Saturation: 95.8 %
PATIENT TEMPERATURE: 98.6
PCO2 ART: 32.7 mmHg — AB (ref 35.0–45.0)
PEEP/CPAP: 5 cmH2O
PEEP: 5 cmH2O
PO2 ART: 71.4 mmHg — AB (ref 80.0–100.0)
Patient temperature: 98.6
RATE: 22 resp/min
RATE: 22 resp/min
TCO2: 22.7 mmol/L (ref 0–100)
TCO2: 24.4 mmol/L (ref 0–100)
pCO2 arterial: 31.3 mmHg — ABNORMAL LOW (ref 35.0–45.0)
pH, Arterial: 7.455 — ABNORMAL HIGH (ref 7.350–7.450)
pH, Arterial: 7.467 — ABNORMAL HIGH (ref 7.350–7.450)
pO2, Arterial: 66.2 mmHg — ABNORMAL LOW (ref 80.0–100.0)

## 2013-05-12 LAB — CBC
HCT: 29.6 % — ABNORMAL LOW (ref 36.0–46.0)
HCT: 34.2 % — ABNORMAL LOW (ref 36.0–46.0)
HEMOGLOBIN: 9 g/dL — AB (ref 12.0–15.0)
HEMOGLOBIN: 10.5 g/dL — AB (ref 12.0–15.0)
MCH: 30.6 pg (ref 26.0–34.0)
MCH: 31.1 pg (ref 26.0–34.0)
MCHC: 30.4 g/dL (ref 30.0–36.0)
MCHC: 30.7 g/dL (ref 30.0–36.0)
MCV: 100.7 fL — ABNORMAL HIGH (ref 78.0–100.0)
MCV: 101.2 fL — ABNORMAL HIGH (ref 78.0–100.0)
RBC: 2.94 MIL/uL — ABNORMAL LOW (ref 3.87–5.11)
RBC: 3.38 MIL/uL — AB (ref 3.87–5.11)
RDW: 20.9 % — ABNORMAL HIGH (ref 11.5–15.5)
RDW: 21.3 % — ABNORMAL HIGH (ref 11.5–15.5)
WBC: 13.9 10*3/uL — AB (ref 4.0–10.5)
WBC: 10.7 10*3/uL — ABNORMAL HIGH (ref 4.0–10.5)

## 2013-05-12 LAB — PROCALCITONIN: PROCALCITONIN: 0.66 ng/mL

## 2013-05-12 LAB — TROPONIN I

## 2013-05-12 MED ORDER — IOHEXOL 300 MG/ML  SOLN
100.0000 mL | Freq: Once | INTRAMUSCULAR | Status: AC | PRN
  Administered 2013-05-12: 100 mL via INTRAVENOUS

## 2013-05-13 DIAGNOSIS — J9610 Chronic respiratory failure, unspecified whether with hypoxia or hypercapnia: Secondary | ICD-10-CM

## 2013-05-13 LAB — CBC
HCT: 27.2 % — ABNORMAL LOW (ref 36.0–46.0)
HEMOGLOBIN: 8.3 g/dL — AB (ref 12.0–15.0)
MCH: 30.4 pg (ref 26.0–34.0)
MCHC: 30.5 g/dL (ref 30.0–36.0)
MCV: 99.6 fL (ref 78.0–100.0)
RBC: 2.73 MIL/uL — ABNORMAL LOW (ref 3.87–5.11)
RDW: 21.2 % — AB (ref 11.5–15.5)
WBC: 11.9 10*3/uL — ABNORMAL HIGH (ref 4.0–10.5)

## 2013-05-13 LAB — BLOOD GAS, ARTERIAL
ACID-BASE EXCESS: 0.8 mmol/L (ref 0.0–2.0)
BICARBONATE: 24.1 meq/L — AB (ref 20.0–24.0)
FIO2: 0.4 %
MECHVT: 500 mL
O2 SAT: 99.1 %
PEEP: 5 cmH2O
Patient temperature: 98.9
RATE: 22 resp/min
TCO2: 25.1 mmol/L (ref 0–100)
pCO2 arterial: 33.7 mmHg — ABNORMAL LOW (ref 35.0–45.0)
pH, Arterial: 7.469 — ABNORMAL HIGH (ref 7.350–7.450)
pO2, Arterial: 108 mmHg — ABNORMAL HIGH (ref 80.0–100.0)

## 2013-05-13 LAB — BASIC METABOLIC PANEL
BUN: 21 mg/dL (ref 6–23)
CALCIUM: 7.7 mg/dL — AB (ref 8.4–10.5)
CHLORIDE: 109 meq/L (ref 96–112)
CO2: 24 mEq/L (ref 19–32)
Creatinine, Ser: 0.74 mg/dL (ref 0.50–1.10)
GFR calc non Af Amer: 79 mL/min — ABNORMAL LOW (ref >90)
Glucose, Bld: 188 mg/dL — ABNORMAL HIGH (ref 70–99)
Potassium: 3.5 mEq/L — ABNORMAL LOW (ref 3.7–5.3)
SODIUM: 144 meq/L (ref 137–147)

## 2013-05-13 NOTE — Progress Notes (Signed)
[name]   Name: [name] MRN: [id number] DOB: 10/14/1932    ADMISSION DATE:  05/05/2013 CONSULTATION DATE:  3/5   REFERRING MD :  Hijazi  PRIMARY SERVICE:  Hijazi   CHIEF COMPLAINT:  Vent management    BRIEF PATIENT DESCRIPTION:  80 yo with left subclavian and axillary vein thrombosis documented 2/24 on anticoagulation [facility] in hemorrhagic shock and acute blood loss anemia. Her coagulopathy was reversed, she was then stabilized and moved back to [facility] on 3/5 to resume weaning efforts. Marland Kitchen.    SIGNIFICANT EVENTS / STUDIES:  3/2 Given Vit K, FFP x 1, PRBC x 2  3/2 Seen by GI >>> No indication for EGD at this time  3/2 CT abdomen / pelvis >>> MODERATE RETROPERITONEAL HEMORRHAGE L psoas / iliacus muscle, adrenal mass, stable cystic adnexal changes   LINES / TUBES:  Trach 2/18 >>>  LUE PICC 2/20 >>>   CULTURES:  3/2 MRSA PCR >>> neg  3/2 Clostridium difficile PCR >>> neg  3/3 Blood >>> neg 3/3 Urine >>> mult morph>>>recullect  3/9 urine>>> yeast  ANTIBIOTICS:  Per ssh     SUBJECTIVE:  No acute distress   VITAL SIGNS: Vital signs reviewed. Abnormal values will appear under impression plan section.    PHYSICAL EXAMINATION:  General: Appears chronically ill, no respiratory distress . Pulled ngt out. Weak Neuro: Awake, alert, nods yes/no HEENT: Tracheostomy site without bleeding / inflammation  Cardiovascular: RRR, no murmurs  Lungs: Bilateral air entry equal. Scattered rhonchi.  Abdomen: Soft, non tender, bowel sounds present  Musculoskeletal: Anasarca, ecchymoses upper extremities bilaterally, L > R BUE edema    Recent Labs Lab 05/12/13 0545 05/12/13 1040 05/13/13 0705  NA 144 142 144  K 3.3* 3.2* 3.5*  CL 110 105 109  CO2 23 24 24   BUN 19 23 21   CREATININE 0.75 0.74 0.74  GLUCOSE 93 199* 188*    Recent Labs Lab 05/12/13 0545 05/12/13 1040 05/13/13 0705  HGB 9.0* 10.5* 8.3*  HCT 29.6* 34.2* 27.2*  WBC 13.9* 10.7* 11.9*   PLT PLATELET CLUMPING, SUGGEST RECOLLECTION OF SAMPLE IN CITRATE TUBE. PLATELET CLUMPING, SUGGEST RECOLLECTION OF SAMPLE IN CITRATE TUBE. PLATELET CLUMPING, SUGGEST RECOLLECTION OF SAMPLE IN CITRATE TUBE.   Ct Angio Chest Pe W/cm &/or Wo Cm  05/12/2013   CLINICAL DATA:  History of left axillary and subclavian vein thrombosis, noted 04/26/2013. Evaluate for pulmonary embolism.  EXAM: CT ANGIOGRAPHY CHEST WITH CONTRAST  TECHNIQUE: Multidetector CT imaging of the chest was performed using the standard protocol during bolus administration of intravenous contrast. Multiplanar CT image reconstructions and MIPs were obtained to evaluate the vascular anatomy.  CONTRAST:  100 cc iodinated contrast  COMPARISON:  04/13/2013  FINDINGS: THORACIC INLET/BODY WALL:  There is a tracheostomy tube which is well seated. Right upper extremity PICC, tip at the upper cavoatrial junction. No appreciable displacement of left-sided biventricular pacer leads.  MEDIASTINUM:  Mild cardiomegaly. No pericardial effusion. No concerning adenopathy. Negative esophagus.  LUNG WINDOWS:  There is extensive, patchy, bilateral ground-glass and consolidative opacities. In the lingula, there is suggestion of early bronchial beading/bronchiectasis. Small, layering bilateral pleural effusions.  UPPER ABDOMEN:  No acute findings.  OSSEOUS:  Nondisplaced anterior left third and fourth rib fractures, new 04/13/2013.  Review of the MIP images confirms the above findings.  IMPRESSION: 1. No evidence of pulmonary embolism. 2. Diffuse lung opacity which is likely related to the patient's clinical diagnosis of ARDS (early fibrotic changes are  noted). There is progressive opacification in the lingula however, cannot exclude superimposed pneumonia. 3. Small bilateral pleural effusions. 4. Due to contrast timing, cannot evaluate for the persistence of left axillary vein thrombus. 5. Nondisplaced left third and fourth rib fractures, new from 04/13/2013.    Electronically Signed   By: Tiburcio Pea M.D.   On: 05/12/2013 15:10   Ct Abdomen Pelvis W Contrast  05/12/2013   CLINICAL DATA Possible bowel rupture.  EXAM CT ABDOMEN AND PELVIS WITH CONTRAST  TECHNIQUE Multidetector CT imaging of the abdomen and pelvis was performed using the standard protocol following bolus administration of intravenous contrast.  CONTRAST OMNIPAQUE IOHEXOL 300 MG/ML  SOLN  COMPARISON Prior CT from 05/02/2013  FINDINGS Bilateral pleural effusions are present, left greater than right. There are associated bibasilar opacities, likely atelectasis and/ fibrosis, although superimposed acute infection is not excluded. Cardiac pacemaker electrodes partially visualized.  The liver demonstrates a normal contrast enhanced appearance. Gallbladder is within normal limits. No biliary ductal dilatation. The spleen, right adrenal gland, pancreas, and kidneys demonstrate a normal contrast enhanced appearance.  Left adrenal mass measuring 2.1 x 2.0 cm is unchanged (series 2, image 30).  No evidence of bowel obstruction. No free intraperitoneal air seen within the abdomen and pelvis to suggest bowel perforation. No new loculated fluid collection. Rectal tube remains in place. Suture material noted at the rectosigmoid junction. Previously seen left retroperitoneal hemorrhage is stable to slightly decreased in size relative to previous examination. No new hemorrhage identified. Again, this hematoma involves the left psoas and iliacus muscles with extension into the left hemipelvis.  Bladder is decompressed with a Foley catheter in place. Adnexal cystic lesions unchanged.  No enlarged intra-abdominal pelvic lymph nodes. Scattered aorto bi-iliac atherosclerotic calcifications noted.  No acute osseous abnormality. No worrisome lytic or blastic osseous lesions. Severe degenerative changes present at L3-4 and L4-5.  Soft tissue stranding a again seen within the left flank, similar to prior.  IMPRESSION 1.  No free intraperitoneal air or new loculated fluid collection to suggest bowel perforation identified. 2. Slightly decreased size of left retroperitoneal hematoma with no new intra-abdominal pelvic hemorrhage identified. 3. Small bilateral pleural effusions, left greater than right. 4. Patchy bibasilar airspace opacities, which may reflect atelectasis and/or fibrosis. Superimposed infection is not excluded. 5. Stable left adrenal mass. 6. Stable cystic adnexal lesions bilaterally.  SIGNATURE  Electronically Signed   By: Rise Mu M.D.   On: 05/12/2013 03:05   Dg Chest Port 1 View  05/12/2013   CLINICAL DATA:  Re-evaluate infiltrates  EXAM: PORTABLE CHEST - 1 VIEW  COMPARISON:  DG CHEST 1V PORT dated 05/12/2013  FINDINGS: The lungs are adequately inflated. The increased interstitial markings bilaterally have become less conspicuous especially on the left. The left hemidiaphragm remains obscured and partial obscuration of the left hemidiaphragm persists. The cardiac silhouette is top-normal in size. The pulmonary vascularity is indistinct but is not clearly engorged. There is no pneumothorax. There is no large pleural effusion but a small pleural effusion on the left is suspected.  The tracheostomy appliance tip lies at the level of the inferior margin of the clavicular heads. The esophagogastric tube tip and the proximal port lie in the region of the proximal gastric body. The permanent pacemaker is unchanged in appearance. The observed portions of the bony thorax exhibit no acute abnormalities.  IMPRESSION: There has been mild interval improvement in the appearance of the pulmonary interstitium with resolving infiltrates or edema suspected.   Electronically Signed  By: David  [place]   On: 05/12/2013 13:02   Dg Chest Port 1 View  05/12/2013   CLINICAL DATA Respiratory distress, fever.  EXAM PORTABLE CHEST - 1 VIEW  COMPARISON May 09, 2013.  FINDINGS Tracheostomy to is unchanged in position.  Nasogastric tube, right-sided PICC line and left-sided pacemaker are unchanged in position. Stable opacity is noted in left lung, with slightly increased opacity seen in right lung. No pneumothorax is noted. However, there does appear to be air underneath the right hemidiaphragm concerning for pneumoperitoneum  IMPRESSION Stable left lung opacity is noted concerning for pneumonia or possibly edema. Slightly increased right lung interstitial opacity is noted concerning for worsening edema or pneumonia. Stable support apparatus. Possible pneumoperitoneum is seen under right hemidiaphragm which is concerning for rupture of hollow viscus; Critical Value/emergent results were called by telephone at the time of interpretation on 05/12/2013 at 1:33 AM to Dr. Sharyon Medicus, who verbally acknowledged these results.  SIGNATURE  Electronically Signed   By: Roque Lias M.D.   On: 05/12/2013 01:33    ASSESSMENT / PLAN: Acute on chronic respiratory failure - post ARDS pulmonary fibrosis - s/p trach  ?HCAP  Recommendations  Cont attempts at vent wean per protocol  F/u CXR  Mobilize  F/u  LUE DVT  Resolved Retroperitoneal bleed, w/ resultant hemorrhagic shock in setting anticoagulation for LUE DVT  Recommendations  Monitor cbc  NO anticoagulation  F/u coags    Hypernatremia Protein calorie malnutrition  Hyperthyroid  Diabetes  Deconditioning  recommendations  Defer to primary team.  Needs Peg 3/13  [name] Minor ACNP Adolph Pollack Fitzgerald Pager [phone] till 3 pm If no answer page [phone] 05/13/2013, 10:01 AM  [name], MD, PhD 05/16/2013, 9:34 AM [facility] [phone] or if no answer [phone]

## 2013-05-14 ENCOUNTER — Other Ambulatory Visit (HOSPITAL_COMMUNITY): Payer: Self-pay

## 2013-05-14 LAB — BLOOD GAS, ARTERIAL
ACID-BASE DEFICIT: 0.2 mmol/L (ref 0.0–2.0)
ACID-BASE DEFICIT: 1 mmol/L (ref 0.0–2.0)
Acid-Base Excess: 0.8 mmol/L (ref 0.0–2.0)
BICARBONATE: 23.1 meq/L (ref 20.0–24.0)
Bicarbonate: 24 mEq/L (ref 20.0–24.0)
Bicarbonate: 23.9 mEq/L (ref 20.0–24.0)
FIO2: 0.3 %
FIO2: 0.4 %
FIO2: 0.4 %
LHR: 22 {breaths}/min
MECHVT: 530 mL
MECHVT: 410 mL
O2 SAT: 91.9 %
O2 Saturation: 92.6 %
O2 Saturation: 95.1 %
PCO2 ART: 32.9 mmHg — AB (ref 35.0–45.0)
PEEP/CPAP: 5 cmH2O
PEEP: 5 cmH2O
PEEP: 5 cmH2O
Patient temperature: 98.6
Patient temperature: 98.6
Patient temperature: 98.6
RATE: 22 resp/min
RATE: 22 resp/min
TCO2: 25 mmol/L (ref 0–100)
TCO2: 25.1 mmol/L (ref 0–100)
TCO2: 24.3 mmol/L (ref 0–100)
VT: 470 mL
pCO2 arterial: 38.7 mmHg (ref 35.0–45.0)
pCO2 arterial: 38.2 mmHg (ref 35.0–45.0)
pH, Arterial: 7.477 — ABNORMAL HIGH (ref 7.350–7.450)
pH, Arterial: 7.408 (ref 7.350–7.450)
pH, Arterial: 7.399 (ref 7.350–7.450)
pO2, Arterial: 58.6 mmHg — ABNORMAL LOW (ref 80.0–100.0)
pO2, Arterial: 70.2 mmHg — ABNORMAL LOW (ref 80.0–100.0)
pO2, Arterial: 63.4 mmHg — ABNORMAL LOW (ref 80.0–100.0)

## 2013-05-14 LAB — URINALYSIS, ROUTINE W REFLEX MICROSCOPIC
Bilirubin Urine: NEGATIVE
Glucose, UA: NEGATIVE mg/dL
KETONES UR: NEGATIVE mg/dL
NITRITE: NEGATIVE
PROTEIN: 100 mg/dL — AB
Specific Gravity, Urine: 1.026 (ref 1.005–1.030)
Urobilinogen, UA: 0.2 mg/dL (ref 0.0–1.0)
pH: 7 (ref 5.0–8.0)

## 2013-05-14 LAB — URINE MICROSCOPIC-ADD ON

## 2013-05-14 LAB — BASIC METABOLIC PANEL
BUN: 15 mg/dL (ref 6–23)
CALCIUM: 7.1 mg/dL — AB (ref 8.4–10.5)
CO2: 24 mEq/L (ref 19–32)
Chloride: 109 mEq/L (ref 96–112)
Creatinine, Ser: 0.71 mg/dL (ref 0.50–1.10)
GFR, EST NON AFRICAN AMERICAN: 80 mL/min — AB (ref >90)
GLUCOSE: 146 mg/dL — AB (ref 70–99)
Potassium: 3.5 mEq/L — ABNORMAL LOW (ref 3.7–5.3)
Sodium: 141 mEq/L (ref 137–147)

## 2013-05-14 LAB — PROCALCITONIN: PROCALCITONIN: 0.79 ng/mL

## 2013-05-14 NOTE — H&P (Signed)
[name] is an 80 y.o. female.   Chief Complaint: deconditioning; malnutrition Cardiomyopathy afib Thrombus of L subclavian vein and axillary vein--was anticoagulated in [facility] Developed massive retroperitoneal bleed Hemorrhagic shock and anemia--transferred to ICU Reversed and readmitted to [facility] 3/5 Request for percutaneous gastric tube placement  HPI: Hypothyroid; heart block; afib; HTN; HLD; 3rd stage renal failure On vent  Past Medical History  Diagnosis Date  . Hypothyroid   . Complete heart block 2002    Initial PPM 2002 with upgrade by JA to CRT-P 11/21/08  . Nonischemic cardiomyopathy   . Persistent atrial fibrillation   . Hypertension   . Hyperlipidemia   . Hives 4/10  . Cataract 4/13    right  . Renal failure     3rd stage-seeing neurologist    Past Surgical History  Procedure Laterality Date  . Pacemaker insertion  2002, 2010    PPM 2002, upgrade to CRT-P by Dr Rayann Heman (MDT 2010)  . Partial colectomy  1/04  . Abdominal hysterectomy  1987    TAH  . Bunionectomy Right 1996  . Breast biopsy Left 01/1997  . Cataract extraction  4/13    both eyes  . Cyst excision      on back of left ear    Family History  Problem Relation Age of Onset  . Diabetes Maternal Aunt   . Breast cancer Maternal Aunt   . Stroke Mother   . Stroke Father   . Hypothyroidism Sister   . Hypothyroidism Son   . Hypothyroidism Mother   . Hypertension Father   . Kidney disease Father     renal problems   Social History:  reports that she has never smoked. She has never used smokeless tobacco. She reports that she does not drink alcohol or use illicit drugs.  Allergies:  Allergies  Allergen Reactions  . Beta Adrenergic Blockers Other (See Comments)    Not working well for pt.  Thayer Jew Hcl] Shortness Of Breath  . Carvedilol Shortness Of Breath    Chest  Discomfort.  . Dapsone Other (See Comments)    Kidney  failure  . Diclofenac Sodium Other (See Comments)     Causes  Fluid  Retention.  Chanda Busing [Pitavastatin Calcium] Shortness Of Breath  . Methotrexate Shortness Of Breath    Mouth sores.  . Metoprolol Succinate Hives  . Oxaprozin Diarrhea  . Other     Fragrance mixtures -  Cinol botanicals  &  Plants oil. Cocamidoproy   -   Betaine. Lipstick & other makeup & cleaning materials.   Marland Kitchen Epinephrine     Medications Prior to Admission  Medication Sig Dispense Refill  . acetaminophen (TYLENOL) 325 MG tablet Take 650 mg by mouth every 6 (six) hours as needed for mild pain or fever.      Marland Kitchen acetaminophen (TYLENOL) 650 MG suppository Place 650 mg rectally every 6 (six) hours as needed for mild pain or fever.      Marland Kitchen albuterol (PROVENTIL) (2.5 MG/3ML) 0.083% nebulizer solution Take 2.5 mg by nebulization every 2 (two) hours as needed for wheezing or shortness of breath.      . Amino Acids-Protein Hydrolys (FEEDING SUPPLEMENT, PRO-STAT SUGAR FREE 64,) LIQD Place 30 mLs into feeding tube daily.  277 mL  0  . folic acid (FOLVITE) 1 MG tablet Take 1 mg by mouth daily.        . insulin aspart (NOVOLOG) 100 UNIT/ML injection Inject 0-15 Units into the skin  every 4 (four) hours.  10 mL  11  . ipratropium-albuterol (DUONEB) 0.5-2.5 (3) MG/3ML SOLN Take 3 mLs by nebulization every 6 (six) hours.  360 mL    . levothyroxine (SYNTHROID, LEVOTHROID) 75 MCG tablet Place 0.5 tablets (37.5 mcg total) into feeding tube daily before breakfast.      . methylPREDNISolone sodium succinate (SOLU-MEDROL) 40 mg/mL injection Inject 1 mL (40 mg total) into the vein daily.  1 each  0  . morphine 2 MG/ML injection Inject 1 mL (2 mg total) into the vein every 3 (three) hours as needed.  1 mL  0  . Nutritional Supplements (FEEDING SUPPLEMENT, VITAL 1.5 CAL,) LIQD Place 1,000 mLs into feeding tube daily.      . pantoprazole (PROTONIX) 40 MG injection Inject 40 mg into the vein daily.  1 each    . piperacillin-tazobactam (ZOSYN) 3.375 GM/50ML IVPB Inject 50 mLs (3.375 g total) into the  vein every 8 (eight) hours.  50 mL    . sertraline (ZOLOFT) 50 MG tablet Take 50 mg by mouth daily.      . Vancomycin (VANCOCIN) 750 MG/150ML SOLN Inject 150 mLs (750 mg total) into the vein every 12 (twelve) hours.        Results for orders placed during the hospital encounter of 05/05/13 (from the past 48 hour(s))  BLOOD GAS, ARTERIAL     Status: Abnormal   Collection Time    05/13/13  4:45 AM      Result Value Ref Range   FIO2 0.40     Delivery systems VENTILATOR     Mode ASSIST CONTROL     VT 500     Rate 22     Peep/cpap 5.0     pH, Arterial 7.469 (*) 7.350 - 7.450   pCO2 arterial 33.7 (*) 35.0 - 45.0 mmHg   pO2, Arterial 108.0 (*) 80.0 - 100.0 mmHg   Bicarbonate 24.1 (*) 20.0 - 24.0 mEq/L   TCO2 25.1  0 - 100 mmol/L   Acid-Base Excess 0.8  0.0 - 2.0 mmol/L   O2 Saturation 99.1     Patient temperature 98.9     Collection site RIGHT RADIAL     Drawn by COLLECTED BY RT     Sample type ARTERIAL DRAW     Allens test (pass/fail) PASS  PASS  CBC     Status: Abnormal   Collection Time    05/13/13  7:05 AM      Result Value Ref Range   WBC 11.9 (*) 4.0 - 10.5 K/uL   RBC 2.73 (*) 3.87 - 5.11 MIL/uL   Hemoglobin 8.3 (*) 12.0 - 15.0 g/dL   Comment: SPECIMEN CHECKED FOR CLOTS     REPEATED TO VERIFY     DELTA CHECK NOTED   HCT 27.2 (*) 36.0 - 46.0 %   MCV 99.6  78.0 - 100.0 fL   MCH 30.4  26.0 - 34.0 pg   MCHC 30.5  30.0 - 36.0 g/dL   RDW 21.2 (*) 11.5 - 15.5 %   Platelets    150 - 400 K/uL   Value: PLATELET CLUMPING, SUGGEST RECOLLECTION OF SAMPLE IN CITRATE TUBE.  BASIC METABOLIC PANEL     Status: Abnormal   Collection Time    05/13/13  7:05 AM      Result Value Ref Range   Sodium 144  137 - 147 mEq/L   Potassium 3.5 (*) 3.7 - 5.3 mEq/L   Chloride 109  96 -  112 mEq/L   CO2 24  19 - 32 mEq/L   Glucose, Bld 188 (*) 70 - 99 mg/dL   BUN 21  6 - 23 mg/dL   Creatinine, Ser 0.74  0.50 - 1.10 mg/dL   Calcium 7.7 (*) 8.4 - 10.5 mg/dL   GFR calc non Af Amer 79 (*) >90 mL/min    GFR calc Af Amer >90  >90 mL/min   Comment: (NOTE)     The eGFR has been calculated using the CKD EPI equation.     This calculation has not been validated in all clinical situations.     eGFR's persistently <90 mL/min signify possible Chronic Kidney     Disease.  CULTURE, RESPIRATORY (NON-EXPECTORATED)     Status: None   Collection Time    05/13/13 10:46 PM      Result Value Ref Range   Specimen Description TRACHEAL ASPIRATE     Special Requests NONE     Gram Stain       Value: ABUNDANT WBC PRESENT,BOTH PMN AND MONONUCLEAR     NO SQUAMOUS EPITHELIAL CELLS SEEN     NO ORGANISMS SEEN     Performed at [facility]   Culture PENDING     Report Status PENDING    PROCALCITONIN     Status: None   Collection Time    05/14/13  6:48 AM      Result Value Ref Range   Procalcitonin 0.79     Comment:            Interpretation:     PCT > 0.5 ng/mL and <= 2 ng/mL:     Systemic infection (sepsis) is possible,     but other conditions are known to elevate     PCT as well.     (NOTE)             ICU PCT Algorithm               Non ICU PCT Algorithm        ----------------------------     ------------------------------             PCT < 0.25 ng/mL                 PCT < 0.1 ng/mL         Stopping of antibiotics            Stopping of antibiotics           strongly encouraged.               strongly encouraged.        ----------------------------     ------------------------------           PCT level decrease by               PCT < 0.25 ng/mL           >= 80% from peak PCT           OR PCT 0.25 - 0.5 ng/mL          Stopping of antibiotics                                                 encouraged.         Stopping of antibiotics  encouraged.        ----------------------------     ------------------------------           PCT level decrease by              PCT >= 0.25 ng/mL           < 80% from peak PCT            AND PCT >= 0.5 ng/mL            Continuing antibiotics                                                   encouraged.           Continuing antibiotics                encouraged.        ----------------------------     ------------------------------         PCT level increase compared          PCT > 0.5 ng/mL             with peak PCT AND              PCT >= 0.5 ng/mL             Escalation of antibiotics                                              strongly encouraged.          Escalation of antibiotics            strongly encouraged.  BLOOD GAS, ARTERIAL     Status: Abnormal   Collection Time    05/14/13  7:45 AM      Result Value Ref Range   FIO2 0.30     Delivery systems VENTILATOR     Mode ASSIST CONTROL     VT 530.0     Rate 22.0     Peep/cpap 5.0     pH, Arterial 7.477 (*) 7.350 - 7.450   pCO2 arterial 32.9 (*) 35.0 - 45.0 mmHg   pO2, Arterial 58.6 (*) 80.0 - 100.0 mmHg   Bicarbonate 24.0  20.0 - 24.0 mEq/L   TCO2 25.0  0 - 100 mmol/L   Acid-Base Excess 0.8  0.0 - 2.0 mmol/L   O2 Saturation 92.6     Patient temperature 98.6     Collection site RIGHT RADIAL     Drawn by COLLECTED BY RT     Sample type ARTERIAL DRAW     Allens test (pass/fail) PASS  PASS  URINALYSIS, ROUTINE W REFLEX MICROSCOPIC     Status: Abnormal   Collection Time    05/14/13  9:02 AM      Result Value Ref Range   Color, Urine YELLOW  YELLOW   APPearance TURBID (*) CLEAR   Specific Gravity, Urine 1.026  1.005 - 1.030   pH 7.0  5.0 - 8.0   Glucose, UA NEGATIVE  NEGATIVE mg/dL   Hgb urine dipstick LARGE (*) NEGATIVE   Bilirubin Urine NEGATIVE  NEGATIVE   Ketones, ur NEGATIVE  NEGATIVE mg/dL   Protein, ur 100 (*) NEGATIVE mg/dL  Urobilinogen, UA 0.2  0.0 - 1.0 mg/dL   Nitrite NEGATIVE  NEGATIVE   Leukocytes, UA MODERATE (*) NEGATIVE  URINE MICROSCOPIC-ADD ON     Status: Abnormal   Collection Time    05/14/13  9:02 AM      Result Value Ref Range   Squamous Epithelial / LPF FEW (*) RARE   WBC, UA 11-20  <3 WBC/hpf   RBC / HPF 3-6  <3 RBC/hpf    Urine-Other MANY YEAST     Ct Angio Chest Pe W/cm &/or Wo Cm  05/12/2013   CLINICAL DATA:  History of left axillary and subclavian vein thrombosis, noted 04/26/2013. Evaluate for pulmonary embolism.  EXAM: CT ANGIOGRAPHY CHEST WITH CONTRAST  TECHNIQUE: Multidetector CT imaging of the chest was performed using the standard protocol during bolus administration of intravenous contrast. Multiplanar CT image reconstructions and MIPs were obtained to evaluate the vascular anatomy.  CONTRAST:  100 cc iodinated contrast  COMPARISON:  04/13/2013  FINDINGS: THORACIC INLET/BODY WALL:  There is a tracheostomy tube which is well seated. Right upper extremity PICC, tip at the upper cavoatrial junction. No appreciable displacement of left-sided biventricular pacer leads.  MEDIASTINUM:  Mild cardiomegaly. No pericardial effusion. No concerning adenopathy. Negative esophagus.  LUNG WINDOWS:  There is extensive, patchy, bilateral ground-glass and consolidative opacities. In the lingula, there is suggestion of early bronchial beading/bronchiectasis. Small, layering bilateral pleural effusions.  UPPER ABDOMEN:  No acute findings.  OSSEOUS:  Nondisplaced anterior left third and fourth rib fractures, new 04/13/2013.  Review of the MIP images confirms the above findings.  IMPRESSION: 1. No evidence of pulmonary embolism. 2. Diffuse lung opacity which is likely related to the patient's clinical diagnosis of ARDS (early fibrotic changes are noted). There is progressive opacification in the lingula however, cannot exclude superimposed pneumonia. 3. Small bilateral pleural effusions. 4. Due to contrast timing, cannot evaluate for the persistence of left axillary vein thrombus. 5. Nondisplaced left third and fourth rib fractures, new from 04/13/2013.   Electronically Signed   By: Jorje Guild M.D.   On: 05/12/2013 15:10   Dg Abd Portable 1v  05/14/2013   CLINICAL DATA:  Check gastric catheter placement  EXAM: PORTABLE ABDOMEN - 1 VIEW   COMPARISON:  None.  FINDINGS: The gastric catheter is noted with tip coiled within the fundus. Scattered large and small bowel gas is noted. No other focal abnormality is seen.   Electronically Signed   By: Inez Catalina M.D.   On: 05/14/2013 11:27    Review of Systems  Constitutional: Positive for fever and weight loss.    Last menstrual period 03/03/1985. Physical Exam  Constitutional: She appears well-nourished.  Cardiovascular: Normal rate.   No murmur heard. Respiratory: She is in respiratory distress.  On vent  GI: Soft. Bowel sounds are normal. There is no tenderness.  Musculoskeletal:  No response; no movement  Neurological:  Discussed with husband; consent in chart  Skin: Skin is warm and dry.     Assessment/Plan Deconditioning and malnutrition Has been in [facility] x 6 weeks Request for perc G tube in IR Pt has spiked temp over weekend and has new UTI Will plan for next week Husband aware of procedure benefits and risks and agreeable to proceed Consent signed andin chart  Dazaria Macneill A 05/14/2013, 12:03 PM

## 2013-05-15 ENCOUNTER — Other Ambulatory Visit (HOSPITAL_COMMUNITY): Payer: Self-pay

## 2013-05-15 LAB — BLOOD GAS, ARTERIAL
ACID-BASE DEFICIT: 2 mmol/L (ref 0.0–2.0)
Acid-base deficit: 3.3 mmol/L — ABNORMAL HIGH (ref 0.0–2.0)
Acid-base deficit: 1.2 mmol/L (ref 0.0–2.0)
Bicarbonate: 22.3 mEq/L (ref 20.0–24.0)
Bicarbonate: 23.9 mEq/L (ref 20.0–24.0)
Bicarbonate: 22.9 mEq/L (ref 20.0–24.0)
FIO2: 0.6 %
FIO2: 0.6 %
FIO2: 50 %
LHR: 22 {breaths}/min
O2 Saturation: 93.3 %
O2 Saturation: 99.7 %
O2 Saturation: 99.7 %
PATIENT TEMPERATURE: 98.9
PCO2 ART: 41.2 mmHg (ref 35.0–45.0)
PEEP: 10 cmH2O
PEEP: 5 cmH2O
PEEP: 8 cmH2O
PH ART: 7.279 — AB (ref 7.350–7.450)
PH ART: 7.329 — AB (ref 7.350–7.450)
Patient temperature: 98.6
Patient temperature: 97
RATE: 25 resp/min
RATE: 25 resp/min
TCO2: 23.8 mmol/L (ref 0–100)
TCO2: 25.3 mmol/L (ref 0–100)
TCO2: 24.2 mmol/L (ref 0–100)
VT: 410 mL
VT: 0.41 mL
VT: 410 mL
pCO2 arterial: 49.3 mmHg — ABNORMAL HIGH (ref 35.0–45.0)
pCO2 arterial: 46.7 mmHg — ABNORMAL HIGH (ref 35.0–45.0)
pH, Arterial: 7.358 (ref 7.350–7.450)
pO2, Arterial: 74 mmHg — ABNORMAL LOW (ref 80.0–100.0)
pO2, Arterial: 99.7 mmHg (ref 80.0–100.0)
pO2, Arterial: 124 mmHg — ABNORMAL HIGH (ref 80.0–100.0)

## 2013-05-15 LAB — CBC WITH DIFFERENTIAL/PLATELET
BASOS PCT: 0 % (ref 0–1)
Basophils Absolute: 0 10*3/uL (ref 0.0–0.1)
EOS ABS: 0.1 10*3/uL (ref 0.0–0.7)
Eosinophils Relative: 1 % (ref 0–5)
HCT: 28.4 % — ABNORMAL LOW (ref 36.0–46.0)
Hemoglobin: 8.8 g/dL — ABNORMAL LOW (ref 12.0–15.0)
Lymphocytes Relative: 15 % (ref 12–46)
Lymphs Abs: 1.6 10*3/uL (ref 0.7–4.0)
MCH: 31.4 pg (ref 26.0–34.0)
MCHC: 31 g/dL (ref 30.0–36.0)
MCV: 101.4 fL — ABNORMAL HIGH (ref 78.0–100.0)
Monocytes Absolute: 0.7 10*3/uL (ref 0.1–1.0)
Monocytes Relative: 7 % (ref 3–12)
NEUTROS ABS: 8.2 10*3/uL — AB (ref 1.7–7.7)
NEUTROS PCT: 78 % — AB (ref 43–77)
Platelets: 110 10*3/uL — ABNORMAL LOW (ref 150–400)
RBC: 2.8 MIL/uL — ABNORMAL LOW (ref 3.87–5.11)
RDW: 19.9 % — ABNORMAL HIGH (ref 11.5–15.5)
WBC: 10.6 10*3/uL — ABNORMAL HIGH (ref 4.0–10.5)

## 2013-05-15 LAB — URINE CULTURE: Colony Count: >=100000

## 2013-05-15 LAB — BASIC METABOLIC PANEL
BUN: 17 mg/dL (ref 6–23)
CHLORIDE: 107 meq/L (ref 96–112)
CO2: 21 meq/L (ref 19–32)
Calcium: 7.4 mg/dL — ABNORMAL LOW (ref 8.4–10.5)
Creatinine, Ser: 0.65 mg/dL (ref 0.50–1.10)
GFR calc non Af Amer: 82 mL/min — ABNORMAL LOW (ref >90)
GLUCOSE: 195 mg/dL — AB (ref 70–99)
Potassium: 3.8 mEq/L (ref 3.7–5.3)
Sodium: 141 mEq/L (ref 137–147)

## 2013-05-15 LAB — VANCOMYCIN, TROUGH: Vancomycin Tr: 24.9 ug/mL — ABNORMAL HIGH (ref 10.0–20.0)

## 2013-05-16 DIAGNOSIS — N189 Chronic kidney disease, unspecified: Secondary | ICD-10-CM

## 2013-05-16 DIAGNOSIS — Z930 Tracheostomy status: Secondary | ICD-10-CM

## 2013-05-16 DIAGNOSIS — N179 Acute kidney failure, unspecified: Secondary | ICD-10-CM

## 2013-05-16 LAB — BLOOD GAS, ARTERIAL
Acid-base deficit: 1.4 mmol/L (ref 0.0–2.0)
Bicarbonate: 23.8 mEq/L (ref 20.0–24.0)
FIO2: 0.4 %
O2 SAT: 96.3 %
PATIENT TEMPERATURE: 98.6
PEEP: 8 cmH2O
PH ART: 7.323 — AB (ref 7.350–7.450)
RATE: 25 resp/min
TCO2: 25.3 mmol/L (ref 0–100)
VT: 410 mL
pCO2 arterial: 47.3 mmHg — ABNORMAL HIGH (ref 35.0–45.0)
pO2, Arterial: 76.6 mmHg — ABNORMAL LOW (ref 80.0–100.0)

## 2013-05-16 LAB — BASIC METABOLIC PANEL
BUN: 26 mg/dL — ABNORMAL HIGH (ref 6–23)
CALCIUM: 7.5 mg/dL — AB (ref 8.4–10.5)
CO2: 24 mEq/L (ref 19–32)
Chloride: 105 mEq/L (ref 96–112)
Creatinine, Ser: 0.96 mg/dL (ref 0.50–1.10)
GFR calc Af Amer: 64 mL/min — ABNORMAL LOW (ref >90)
GFR calc non Af Amer: 55 mL/min — ABNORMAL LOW (ref >90)
Glucose, Bld: 126 mg/dL — ABNORMAL HIGH (ref 70–99)
Potassium: 3.6 mEq/L — ABNORMAL LOW (ref 3.7–5.3)
Sodium: 139 mEq/L (ref 137–147)

## 2013-05-16 LAB — CBC
HCT: 27.4 % — ABNORMAL LOW (ref 36.0–46.0)
Hemoglobin: 8.3 g/dL — ABNORMAL LOW (ref 12.0–15.0)
MCH: 30.7 pg (ref 26.0–34.0)
MCHC: 30.3 g/dL (ref 30.0–36.0)
MCV: 101.5 fL — ABNORMAL HIGH (ref 78.0–100.0)
RBC: 2.7 MIL/uL — AB (ref 3.87–5.11)
RDW: 19.6 % — ABNORMAL HIGH (ref 11.5–15.5)
WBC: 11.4 10*3/uL — AB (ref 4.0–10.5)

## 2013-05-16 LAB — CULTURE, RESPIRATORY

## 2013-05-16 LAB — PROCALCITONIN: PROCALCITONIN: 0.49 ng/mL

## 2013-05-16 LAB — CULTURE, RESPIRATORY W GRAM STAIN

## 2013-05-16 NOTE — Progress Notes (Signed)
[facility]                                                                                              Progress note     Patient Demographics  [name], is a 80 y.o. female  ZOX:[id number]  WJX:[id number]  DOB - 05/03/1932  Admit date - 05/05/2013  Admitting Physician Carron CurieAli Akshith Moncus, MD  Outpatient Primary MD for the patient is Astrid DivineGRIFFIN,ELAINE COLLINS, MD  LOS - 11   CC : Respiratory failure          Norovirus         Retroperitoneal hematoma         DVT                                                                                                                                                                                                         Subjective:   [name] obtunded and cannot give any history  Objective:   Vital signs  Temperature 97.3 Heart rate 74 Respiratory rate 23 Blood pressure 109/40 Pulse ox 100%    Exam Obtunded  [place].AT,PERRAL NG tube noted in place Supple Neck,No JVD, No cervical lymphadenopathy appriciated. Tracheostomy noted  Symmetrical Chest wall movement, decreased breath sounds bilaterally basally RRR,No Gallops,Rubs or new Murmurs, No Parasternal Heave +ve B.Sounds, Abd Soft, mild generalized tenderness, No organomegaly appriciated, No rebound - guarding or rigidity. No Cyanosis, Clubbing or edema, No new Rash or bruise     I&Os 2832.97/1150 Foley yes Trach yes  Data Review   CBC  Recent Labs Lab 05/12/13 0545 05/12/13 1040 05/13/13 0705 05/15/13 0530 05/16/13 0650  WBC 13.9* 10.7* 11.9* 10.6* 11.4*  HGB 9.0* 10.5* 8.3* 8.8* 8.3*  HCT 29.6* 34.2* 27.2* 28.4* 27.4*  PLT PLATELET CLUMPING, SUGGEST RECOLLECTION OF SAMPLE IN CITRATE TUBE. PLATELET CLUMPING, SUGGEST RECOLLECTION OF SAMPLE IN CITRATE TUBE. PLATELET CLUMPING, SUGGEST RECOLLECTION OF SAMPLE IN CITRATE TUBE. 110* PLATELET CLUMPING, SUGGEST RECOLLECTION OF SAMPLE IN  CITRATE TUBE.  MCV 100.7* 101.2* 99.6 101.4* 101.5*  MCH 30.6 31.1 30.4 31.4 30.7  MCHC 30.4 30.7 30.5 31.0 30.3  RDW 20.9* 21.3* 21.2* 19.9* 19.6*  LYMPHSABS  --   --   --  1.6  --   MONOABS  --   --   --  0.7  --   EOSABS  --   --   --  0.1  --   BASOSABS  --   --   --  0.0  --     Chemistries   Recent Labs Lab 05/12/13 1040 05/13/13 0705 05/14/13 1422 05/15/13 0530 05/16/13 0650  NA 142 144 141 141 139  K 3.2* 3.5* 3.5* 3.8 3.6*  CL 105 109 109 107 105  CO2 24 24 24 21 24   GLUCOSE 199* 188* 146* 195* 126*  BUN 23 21 15 17  26*  CREATININE 0.74 0.74 0.71 0.65 0.96  CALCIUM 7.1* 7.7* 7.1* 7.4* 7.5*   Coagulation profile No results found for this basename: INR, PROTIME,  in the last 168 hours   Assessment & Plan  Ventilator dependent respiratory failure/ARDS; increase tidal volume to 420, increase PEEP to 10, and oxygen to 60% Hemorrhagic shock; reversed by fresh frozen plasma vitamin K and blood transfusion /resolved Left pneumothorax status post chest tube placement and removal SIRS continue with IV meropenem Hypernatremia; start D5 water/increase H2O per tube, sodium 139 Hypertension A. fib controlled rate at 74 Protein calorie malnutrition; continue with Vital 1.5 there NG tube Generalized weakness; PT OT  Delerium Retroperitoneal bleed History of norovirus infection, resolved Urinary tract infection; start Diflucan for yeast  Plan DC vancomycin Start Diflucan   Critical care time 35 minutes  Code Status:  full  Family Communication: Discussed with family who was at bedside  Consults  PCCM. and ID  Antibiotics meropenem, Diflucan  DVT Prophylaxis  SCDs   Carron Curie M.D on 05/16/2013 at 5:54 PM

## 2013-05-16 NOTE — Progress Notes (Signed)
Patient with persistent fevers and increase demand on ventilator settings. RN expressed patient's family concern for gastrostomy tube at this time, I have discussed this with Dr. Sharyon Medicus today and IR order will be cancelled. [facility] to reorder gastrostomy tube if wanted.  Pattricia Boss PA-C Interventional Radiology  05/16/13  2:34 PM

## 2013-05-16 NOTE — Progress Notes (Signed)
[place] [facility]   Name: [name] MRN: [id number] DOB: Jan 20, 1933    ADMISSION DATE:  05/05/2013 CONSULTATION DATE:  3/5   REFERRING MD :  Hijazi  PRIMARY SERVICE:  Hijazi   CHIEF COMPLAINT:  Vent management    BRIEF PATIENT DESCRIPTION:  80 yo with left subclavian and axillary vein thrombosis documented 2/24 on anticoagulation transferred from [facility] on 3/2 in hemorrhagic shock and acute blood loss anemia. Her coagulopathy was reversed, she was then stabilized and moved back to [facility] on 3/5 to resume weaning efforts. Marland Kitchen    SIGNIFICANT EVENTS / STUDIES:  3/2 Given Vit K, FFP x 1, PRBC x 2  3/2 Seen by GI >>> No indication for EGD at this time  3/2 CT abdomen / pelvis >>> MODERATE RETROPERITONEAL HEMORRHAGE L psoas / iliacus muscle, adrenal mass, stable cystic adnexal changes  3/16 has refused peg. Looks worse LINES / TUBES:  Trach 2/18 >>>  LUE PICC 2/20 >>>   CULTURES:  3/2 MRSA PCR >>> neg  3/2 Clostridium difficile PCR >>> neg  3/3 Blood >>> neg 3/3 Urine >>> mult morph>>>recullect  3/9 urine>>> yeast  ANTIBIOTICS:  Per ssh     SUBJECTIVE:  No acute distress   VITAL SIGNS: Vital signs reviewed. Abnormal values will appear under impression plan section.    PHYSICAL EXAMINATION:  General: Appears chronically ill, no respiratory distress .  Weak Neuro: lethargic HEENT: Tracheostomy site without bleeding / inflammation /ogt Cardiovascular: RRR, no murmurs  Lungs: Bilateral air entry equal. Scattered rhonchi.  Abdomen: Soft, non tender, bowel sounds present  Musculoskeletal: Anasarca, ecchymoses upper extremities bilaterally, L > R BUE edema    Recent Labs Lab 05/14/13 1422 05/15/13 0530 05/16/13 0650  NA 141 141 139  K 3.5* 3.8 3.6*  CL 109 107 105  CO2 24 21 24   BUN 15 17 26*  CREATININE 0.71 0.65 0.96  GLUCOSE 146* 195* 126*    Recent Labs Lab 05/13/13 0705 05/15/13 0530 05/16/13 0650  HGB 8.3* 8.8* 8.3*  HCT 27.2* 28.4* 27.4*  WBC 11.9* 10.6*  11.4*  PLT PLATELET CLUMPING, SUGGEST RECOLLECTION OF SAMPLE IN CITRATE TUBE. 110* PLATELET CLUMPING, SUGGEST RECOLLECTION OF SAMPLE IN CITRATE TUBE.   Dg Chest Port 1 View  05/15/2013   CLINICAL DATA:  Respiratory failure  EXAM: PORTABLE CHEST - 1 VIEW  COMPARISON:  05/12/2013  FINDINGS: Bilateral infiltrates are again identified. Given some technical variations in the film, the overall appearance is stable. A pacing device, tracheostomy tube and nasogastric catheter are again seen. A right-sided PICC line is again noted in the distal superior vena cava.  IMPRESSION: No significant interval change from the prior exam.   Electronically Signed   By: Alcide Clever M.D.   On: 05/15/2013 09:49   Dg Abd Portable 1v  05/14/2013   CLINICAL DATA:  Check gastric catheter placement  EXAM: PORTABLE ABDOMEN - 1 VIEW  COMPARISON:  None.  FINDINGS: The gastric catheter is noted with tip coiled within the fundus. Scattered large and small bowel gas is noted. No other focal abnormality is seen.   Electronically Signed   By: Alcide Clever M.D.   On: 05/14/2013 11:27    ASSESSMENT / PLAN: Acute on chronic respiratory failure - post ARDS pulmonary fibrosis - s/p trach  ?HCAP  Recommendations  Cont attempts at vent wean per protocol (doubt she will ever wean) F/u CXR  Mobilize  F/u  LUE DVT  Resolved Retroperitoneal bleed, w/ resultant hemorrhagic shock in  setting anticoagulation for LUE DVT  Recommendations  Monitor cbc  NO anticoagulation  F/u coags    Hypernatremia Protein calorie malnutrition  Hyperthyroid  Diabetes  Deconditioning  recommendations  Defer to primary team.  Needs Peg 3/13, refused peg has ogt Her prognosis is poor 3/16  Brett CanalesSteve Minor ACNP [facility] Pager [phone] till 3 pm If no answer page [phone] 05/16/2013, 9:30 AM  Hypoxic respiratory failure overnight, increasing PEEP to 10 and FiO2 to 70%.  Needs diureses but ultimately, the patient is very unlikely to survive this  admission.  Recommend palliation in this patient and a discussion with family.  Patient seen and examined, agree with above note.  I dictated the care and orders written for this patient under my direction.  Alyson ReedyWesam G Niranjan Rufener, MD [phone]

## 2013-05-17 ENCOUNTER — Other Ambulatory Visit (HOSPITAL_COMMUNITY): Payer: Self-pay

## 2013-05-17 LAB — CBC
HEMATOCRIT: 24.6 % — AB (ref 36.0–46.0)
Hemoglobin: 7.5 g/dL — ABNORMAL LOW (ref 12.0–15.0)
MCH: 30.9 pg (ref 26.0–34.0)
MCHC: 30.5 g/dL (ref 30.0–36.0)
MCV: 101.2 fL — AB (ref 78.0–100.0)
RBC: 2.43 MIL/uL — ABNORMAL LOW (ref 3.87–5.11)
RDW: 19.5 % — AB (ref 11.5–15.5)
WBC: 10.5 10*3/uL (ref 4.0–10.5)

## 2013-05-17 LAB — BASIC METABOLIC PANEL
BUN: 36 mg/dL — ABNORMAL HIGH (ref 6–23)
CALCIUM: 7.5 mg/dL — AB (ref 8.4–10.5)
CO2: 23 mEq/L (ref 19–32)
CREATININE: 1.17 mg/dL — AB (ref 0.50–1.10)
Chloride: 109 mEq/L (ref 96–112)
GFR, EST AFRICAN AMERICAN: 50 mL/min — AB (ref >90)
GFR, EST NON AFRICAN AMERICAN: 43 mL/min — AB (ref >90)
GLUCOSE: 178 mg/dL — AB (ref 70–99)
POTASSIUM: 3.6 meq/L — AB (ref 3.7–5.3)
Sodium: 143 mEq/L (ref 137–147)

## 2013-05-17 NOTE — Progress Notes (Signed)
[facility]                                                                                              Progress note     Patient Demographics  [name], is a 80 y.o. female  ZOX:[id number]  WJX:[id number]  DOB - 04/30/1932  Admit date - 05/05/2013  Admitting Physician Carron CurieAli Semir Brill, MD  Outpatient Primary MD for the patient is Astrid DivineGRIFFIN,ELAINE COLLINS, MD  LOS - 12   CC : Respiratory failure          Norovirus         Retroperitoneal hematoma         DVT                                                                                                                                                                                                         Subjective:   Angela NorrisShirley Fitzgerald obtunded and cannot give any history  Objective:   Vital signs  Temperature 97.8 Heart rate 84 Respiratory rate 23 Blood pressure 11 1/88 Pulse ox 100%    Exam Obtunded  [place].AT,PERRAL NG tube noted in place Supple Neck,No JVD, No cervical lymphadenopathy appriciated. Tracheostomy noted  Symmetrical Chest wall movement, decreased breath sounds bilaterally basally RRR,No Gallops,Rubs or new Murmurs, No Parasternal Heave +ve B.Sounds, Abd Soft, mild generalized tenderness, No organomegaly appriciated, No rebound - guarding or rigidity. No Cyanosis, Clubbing or edema, No new Rash or bruise     I&Os 2107/1650 Foley yes Trach yes  Data Review   CBC  Recent Labs Lab 05/12/13 1040 05/13/13 0705 05/15/13 0530 05/16/13 0650 05/17/13 0525  WBC 10.7* 11.9* 10.6* 11.4* 10.5  HGB 10.5* 8.3* 8.8* 8.3* 7.5*  HCT 34.2* 27.2* 28.4* 27.4* 24.6*  PLT PLATELET CLUMPING, SUGGEST RECOLLECTION OF SAMPLE IN CITRATE TUBE. PLATELET CLUMPING, SUGGEST RECOLLECTION OF SAMPLE IN CITRATE TUBE. 110* PLATELET CLUMPING, SUGGEST RECOLLECTION OF SAMPLE IN CITRATE TUBE. PLATELET CLUMPING, SUGGEST RECOLLECTION OF SAMPLE IN  CITRATE TUBE.  MCV 101.2* 99.6 101.4* 101.5* 101.2*  MCH 31.1 30.4 31.4 30.7 30.9  MCHC 30.7 30.5 31.0 30.3 30.5  RDW 21.3* 21.2* 19.9* 19.6* 19.5*  LYMPHSABS  --   --  1.6  --   --   MONOABS  --   --  0.7  --   --   EOSABS  --   --  0.1  --   --   BASOSABS  --   --  0.0  --   --     Chemistries   Recent Labs Lab 05/13/13 0705 05/14/13 1422 05/15/13 0530 05/16/13 0650 05/17/13 0525  NA 144 141 141 139 143  K 3.5* 3.5* 3.8 3.6* 3.6*  CL 109 109 107 105 109  CO2 24 24 21 24 23   GLUCOSE 188* 146* 195* 126* 178*  BUN 21 15 17  26* 36*  CREATININE 0.74 0.71 0.65 0.96 1.17*  CALCIUM 7.7* 7.1* 7.4* 7.5* 7.5*   Coagulation profile No results found for this basename: INR, PROTIME,  in the last 168 hours  Chest x-ray; ARDS   Assessment & Plan  Ventilator dependent respiratory failure/ARDS;  Hemorrhagic shock; reversed by fresh frozen plasma vitamin K and blood transfusion /resolved Left pneumothorax status post chest tube placement and removal SIRS continue with IV meropenem Hypernatremia; sodium 143 Hypertension; controlled A. fib controlled rate at 74 Protein calorie malnutrition; continue with Vital 1.5 there NG tube Generalized weakness; PT OT  Delerium Retroperitoneal bleed History of norovirus infection, resolved Urinary tract infection; start Diflucan for yeast  Plan Continue same medications  check labs in a.m.    Code Status:  full  Family Communication: Discussed with family who was at bedside  Consults  PCCM. and ID  Antibiotics meropenem, Diflucan  DVT Prophylaxis  SCDs   Carron Curie M.D on 05/17/2013 at 11:37 AM

## 2013-05-18 DIAGNOSIS — I82629 Acute embolism and thrombosis of deep veins of unspecified upper extremity: Secondary | ICD-10-CM

## 2013-05-18 NOTE — Progress Notes (Signed)
[facility]                                                                                              Progress note     Patient Demographics  [name], is a 80 y.o. female  TRR:[id number]  [id number]:[id number]  DOB - 12/13/1932  Admit date - 05/05/2013  Admitting Physician Carron Curie, MD  Outpatient Primary MD for the patient is [name], MD  LOS - 13   CC : Respiratory failure          Norovirus         Retroperitoneal hematoma         DVT                                                                                                                                                                                                         Subjective:   [name] obtunded and cannot give any history  Objective:   Vital signs  Temperature 98.2 Heart rate 96 Respiratory rate 23 Blood pressure 120/35 Pulse ox 97%    Exam Obtunded  [place].AT,PERRAL NG tube noted in place Supple Neck,No JVD, No cervical lymphadenopathy appriciated. Tracheostomy noted  Symmetrical Chest wall movement, decreased breath sounds bilaterally basally RRR,No Gallops,Rubs or new Murmurs, No Parasternal Heave +ve B.Sounds, Abd Soft, mild generalized tenderness, No organomegaly appriciated, No rebound - guarding or rigidity. No Cyanosis, Clubbing or edema, No new Rash or bruise     I&Os 1576/800 Foley yes Trach yes  Data Review   CBC  Recent Labs Lab 05/12/13 1040 05/13/13 0705 05/15/13 0530 05/16/13 0650 05/17/13 0525  WBC 10.7* 11.9* 10.6* 11.4* 10.5  HGB 10.5* 8.3* 8.8* 8.3* 7.5*  HCT 34.2* 27.2* 28.4* 27.4* 24.6*  PLT PLATELET CLUMPING, SUGGEST RECOLLECTION OF SAMPLE IN CITRATE TUBE. PLATELET CLUMPING, SUGGEST RECOLLECTION OF SAMPLE IN CITRATE TUBE. 110* PLATELET CLUMPING, SUGGEST RECOLLECTION OF SAMPLE IN CITRATE TUBE. PLATELET CLUMPING, SUGGEST RECOLLECTION OF SAMPLE IN CITRATE  TUBE.  MCV 101.2* 99.6 101.4* 101.5* 101.2*  MCH 31.1 30.4 31.4 30.7 30.9  MCHC 30.7 30.5 31.0 30.3 30.5  RDW 21.3* 21.2* 19.9* 19.6* 19.5*  LYMPHSABS  --   --  1.6  --   --   MONOABS  --   --  0.7  --   --   EOSABS  --   --  0.1  --   --   BASOSABS  --   --  0.0  --   --     Chemistries   Recent Labs Lab 05/13/13 0705 05/14/13 1422 05/15/13 0530 05/16/13 0650 05/17/13 0525  NA 144 141 141 139 143  K 3.5* 3.5* 3.8 3.6* 3.6*  CL 109 109 107 105 109  CO2 24 24 21 24 23   GLUCOSE 188* 146* 195* 126* 178*  BUN 21 15 17  26* 36*  CREATININE 0.74 0.71 0.65 0.96 1.17*  CALCIUM 7.7* 7.1* 7.4* 7.5* 7.5*   Coagulation profile No results found for this basename: INR, PROTIME,  in the last 168 hours  Chest x-ray: 317- ARDS   Assessment & Plan  Ventilator dependent respiratory failure/ARDS;  Hemorrhagic shock; reversed by fresh frozen plasma vitamin K and blood transfusion /resolved Left pneumothorax status post chest tube placement and removal SIRS continue with IV meropenem/Diflucan Hypernatremia; resolved Hypertension; controlled A. fib controlled rate at 96 Protein calorie malnutrition; continue with Vital 1.5 there NG tube Generalized weakness; PT/ OT  Delerium Retroperitoneal bleed History of norovirus infection, resolved Urinary tract infection; start Diflucan for yeast  Plan Discussed with family at length grave prognosis Lasix 60 mg IV now Lasix daily   Code Status:  full  Family Communication: Discussed with family who was at bedside  Consults  PCCM. and ID  Antibiotics meropenem, Diflucan  DVT Prophylaxis  SCDs   Carron CurieHijazi, Zharia Conrow M.D on 05/18/2013 at 4:57 PM

## 2013-05-18 NOTE — Progress Notes (Signed)
[place] [facility]   Name: [name] MRN: [id number] DOB: 05/11/1932    ADMISSION DATE:  05/05/2013 CONSULTATION DATE:  3/5   REFERRING MD :  Hijazi  PRIMARY SERVICE:  Hijazi   CHIEF COMPLAINT:  Vent management   BRIEF PATIENT DESCRIPTION:  80 yo with left subclavian and axillary vein thrombosis documented 2/24 on anticoagulation [facility] and acute blood loss anemia. Her coagulopathy was reversed, [facility] was then stabilized [facility]. Marland Kitchen.   SIGNIFICANT EVENTS / STUDIES:  3/2 Given Vit K, FFP x 1, PRBC x 2  3/2 Seen by GI >>> No indication for EGD at this time  3/2 CT abdomen / pelvis >>> MODERATE RETROPERITONEAL HEMORRHAGE L psoas / iliacus muscle, adrenal mass, stable cystic adnexal changes  3/16 has refused peg. Looks worse  LINES / TUBES:  Trach 2/18 >>>  LUE PICC 2/20 >>>   CULTURES:  3/2 MRSA PCR >>> neg  3/2 Clostridium difficile PCR >>> neg  3/3 Blood >>> neg 3/3 Urine >>> mult morph>>>recullect  3/9 urine>>> yeast  ANTIBIOTICS:  Per ssh    SUBJECTIVE:  No acute distress   VITAL SIGNS: Vital signs reviewed. Abnormal values will appear under impression plan section.   PHYSICAL EXAMINATION:  General: Appears chronically ill, no respiratory distress .  Weak Neuro: lethargic HEENT: Tracheostomy site without bleeding / inflammation /ogt Cardiovascular: RRR, no murmurs  Lungs: Bilateral air entry equal. Scattered rhonchi.  Abdomen: Soft, non tender, bowel sounds present  Musculoskeletal: Anasarca, ecchymoses upper extremities bilaterally, L > R BUE edema  Recent Labs Lab 05/15/13 0530 05/16/13 0650 05/17/13 0525  NA 141 139 143  K 3.8 3.6* 3.6*  CL 107 105 109  CO2 21 24 23   BUN 17 26* 36*  CREATININE 0.65 0.96 1.17*  GLUCOSE 195* 126* 178*   Recent Labs Lab 05/15/13 0530 05/16/13 0650 05/17/13 0525  HGB 8.8* 8.3* 7.5*  HCT 28.4* 27.4* 24.6*  WBC 10.6* 11.4* 10.5   PLT 110* PLATELET CLUMPING, SUGGEST RECOLLECTION OF SAMPLE IN CITRATE TUBE. PLATELET CLUMPING, SUGGEST RECOLLECTION OF SAMPLE IN CITRATE TUBE.   Dg Chest Port 1 View  05/17/2013   CLINICAL DATA:  Respiratory failure  EXAM: PORTABLE CHEST - 1 VIEW  COMPARISON:  05/15/2013  FINDINGS: Cardiomediastinal silhouette is stable. Tracheostomy tube in place. NG tube in place. Stable right PICC line position. Three leads cardiac pacemaker unchanged in position. Diffuse bilateral airspace disease again noted probable ARDS. Bilateral small pleural effusion with bilateral basilar atelectasis or infiltrate.  IMPRESSION: Tracheostomy tube in place. NG tube in place. Stable right PICC line position. Three leads cardiac pacemaker unchanged in position. Diffuse bilateral airspace disease again noted probable ARDS. Bilateral small pleural effusion with bilateral basilar atelectasis or infiltrate.   Electronically Signed   By: Natasha MeadLiviu  Pop M.D.   On: 05/17/2013 08:26   ASSESSMENT / PLAN: Acute on chronic respiratory failure - post ARDS pulmonary fibrosis - s/p trach  ?HCAP  Recommendations  - ABG ordered and noted, decrease FiO2 to 40 and PEEP to 8, would not decrease that any further at this point. - F/U CXR  - Mobilize as able. - Minimize sedation as that is intervening with rehab which is a contributing cause to this fluctuating hypoxia requiring high PEEP.  - May decrease PEEP to 5 in AM if O2 sats remain stable.  LUE DVT  Resolved Retroperitoneal bleed, w/ resultant hemorrhagic shock in  setting anticoagulation for LUE DVT  Recommendations  - Monitor cbc  - NO anticoagulation  - F/u coags    Hypernatremia Protein calorie malnutrition  Hyperthyroid  Diabetes  Deconditioning  recommendations  - Needs Peg 3/13, refused peg has ogt  Global: Her prognosis is poor and would recommend speaking with family regarding realistic expectation as I anticipate will need vent SNF if survives this at all.  Alyson Reedy, M.D. [facility] Pulmonary/Critical Care Medicine. Pager: [phone]. After hours pager: [phone].

## 2013-05-19 LAB — BLOOD GAS, ARTERIAL
Acid-base deficit: 0.6 mmol/L (ref 0.0–2.0)
BICARBONATE: 24.9 meq/L — AB (ref 20.0–24.0)
FIO2: 50 %
MECHVT: 430 mL
O2 Saturation: 98.1 %
PCO2 ART: 51.3 mmHg — AB (ref 35.0–45.0)
PEEP/CPAP: 10 cmH2O
PH ART: 7.307 — AB (ref 7.350–7.450)
PO2 ART: 96.8 mmHg (ref 80.0–100.0)
Patient temperature: 98.6
RATE: 22 resp/min
TCO2: 26.5 mmol/L (ref 0–100)

## 2013-05-19 NOTE — Progress Notes (Addendum)
Patient ID: [name], female   DOB: 09/23/1932, 80 y.o.   MRN: [id number] Pt tent scheduled for per G tube on 3/20 pending f/u KUB/CXR/labs. VSS; AF. Pt with trach, OG in place; chest- coarse BS bilat; heart- RRR; abd-soft, obese,+BS.  Details/risks of procedure discussed again with pt's daughter with her understanding and consent. See prior H&P from 3/14 for additional details.

## 2013-05-19 NOTE — Progress Notes (Signed)
[facility]                                                                                              Progress note     Patient Demographics  [name], is a 80 y.o. female  YNW:[id number]  MVH:[id number]  DOB - 02/11/1933  Admit date - 05/05/2013  Admitting Physician Carron CurieAli Sophronia Varney, MD  Outpatient Primary MD for the patient is Astrid DivineGRIFFIN,ELAINE COLLINS, MD  LOS - 14   CC : Respiratory failure          Norovirus         Retroperitoneal hematoma         DVT                                                                                                                                                                                                         Subjective:   Angela NorrisShirley Truett obtunded and cannot give any history  Objective:   Vital signs  Temperature 97 Heart rate 90 Respiratory rate 23 Blood pressure 117/57 Pulse ox 97%    Exam Obtunded  [place].AT,PERRAL NG tube noted in place Supple Neck,No JVD, No cervical lymphadenopathy appriciated. Tracheostomy noted  Symmetrical Chest wall movement, decreased breath sounds bilaterally basally RRR,No Gallops,Rubs or new Murmurs, No Parasternal Heave +ve B.Sounds, Abd Soft, mild generalized tenderness, No organomegaly appriciated, No rebound - guarding or rigidity. No Cyanosis, Clubbing or edema, No new Rash or bruise     I&Os 1968/2275 Foley yes Trach yes  Data Review   CBC  Recent Labs Lab 05/13/13 0705 05/15/13 0530 05/16/13 0650 05/17/13 0525  WBC 11.9* 10.6* 11.4* 10.5  HGB 8.3* 8.8* 8.3* 7.5*  HCT 27.2* 28.4* 27.4* 24.6*  PLT PLATELET CLUMPING, SUGGEST RECOLLECTION OF SAMPLE IN CITRATE TUBE. 110* PLATELET CLUMPING, SUGGEST RECOLLECTION OF SAMPLE IN CITRATE TUBE. PLATELET CLUMPING, SUGGEST RECOLLECTION OF SAMPLE IN CITRATE TUBE.  MCV 99.6 101.4* 101.5* 101.2*  MCH 30.4 31.4 30.7 30.9  MCHC 30.5 31.0 30.3 30.5  RDW  21.2* 19.9* 19.6* 19.5*  LYMPHSABS  --  1.6  --   --   MONOABS  --  0.7  --   --  EOSABS  --  0.1  --   --   BASOSABS  --  0.0  --   --     Chemistries   Recent Labs Lab 05/13/13 0705 05/14/13 1422 05/15/13 0530 05/16/13 0650 05/17/13 0525  NA 144 141 141 139 143  K 3.5* 3.5* 3.8 3.6* 3.6*  CL 109 109 107 105 109  CO2 24 24 21 24 23   GLUCOSE 188* 146* 195* 126* 178*  BUN 21 15 17  26* 36*  CREATININE 0.74 0.71 0.65 0.96 1.17*  CALCIUM 7.7* 7.1* 7.4* 7.5* 7.5*   Coagulation profile No results found for this basename: INR, PROTIME,  in the last 168 hours  Chest x-ray: 3/17- ARDS ABGs noted on mechanical ventilation on 3/19   Assessment & Plan  Ventilator dependent respiratory failure/ARDS;  Hemorrhagic shock; reversed by fresh frozen plasma vitamin K and blood transfusion /resolved Left pneumothorax status post chest tube placement and removal SIRS continue with IV meropenem/Diflucan Hypernatremia; resolved Hypertension; controlled A. fib controlled rate at 96 Protein calorie malnutrition; continue with Vital 1.5 there NG tube Generalized weakness; PT/ OT  Delerium Retroperitoneal bleed History of norovirus infection, resolved Urinary tract infection; start Diflucan for yeast Diabetes mellitus; fair control  Plan Discussed with family at length again today again Start steroids place G-tube Change Lasix to by mouth/tube grave prognosis Discussed CODE STATUS, family is still undecided  Critical care time 37 minutes Code Status:  full  Family Communication: Discussed with family who was at bedside  Consults  PCCM. and ID  Antibiotics meropenem, Diflucan  DVT Prophylaxis  SCDs   Carron Curie M.D on 05/19/2013 at 11:39 AM

## 2013-05-20 ENCOUNTER — Other Ambulatory Visit (HOSPITAL_COMMUNITY): Payer: Self-pay

## 2013-05-20 DIAGNOSIS — R404 Transient alteration of awareness: Secondary | ICD-10-CM

## 2013-05-20 LAB — CULTURE, BLOOD (ROUTINE X 2)
Culture: NO GROWTH
Culture: NO GROWTH

## 2013-05-20 LAB — CBC
HEMATOCRIT: 26.6 % — AB (ref 36.0–46.0)
Hemoglobin: 8.1 g/dL — ABNORMAL LOW (ref 12.0–15.0)
MCH: 30 pg (ref 26.0–34.0)
MCHC: 30.5 g/dL (ref 30.0–36.0)
MCV: 98.5 fL (ref 78.0–100.0)
RBC: 2.7 MIL/uL — ABNORMAL LOW (ref 3.87–5.11)
RDW: 18.7 % — AB (ref 11.5–15.5)
WBC: 8.6 10*3/uL (ref 4.0–10.5)

## 2013-05-20 LAB — BASIC METABOLIC PANEL
BUN: 45 mg/dL — AB (ref 6–23)
CO2: 28 mEq/L (ref 19–32)
CREATININE: 0.97 mg/dL (ref 0.50–1.10)
Calcium: 7.5 mg/dL — ABNORMAL LOW (ref 8.4–10.5)
Chloride: 99 mEq/L (ref 96–112)
GFR calc non Af Amer: 54 mL/min — ABNORMAL LOW (ref >90)
GFR, EST AFRICAN AMERICAN: 63 mL/min — AB (ref >90)
Glucose, Bld: 208 mg/dL — ABNORMAL HIGH (ref 70–99)
Potassium: 4.9 mEq/L (ref 3.7–5.3)
Sodium: 138 mEq/L (ref 137–147)

## 2013-05-20 MED ORDER — FENTANYL CITRATE 0.05 MG/ML IJ SOLN
INTRAMUSCULAR | Status: AC
  Filled 2013-05-20: qty 2

## 2013-05-20 MED ORDER — MIDAZOLAM HCL 2 MG/2ML IJ SOLN
INTRAMUSCULAR | Status: AC | PRN
  Administered 2013-05-20 (×2): 2 mg via INTRAVENOUS

## 2013-05-20 MED ORDER — GLUCAGON HCL (RDNA) 1 MG IJ SOLR
INTRAMUSCULAR | Status: DC
Start: 2013-05-20 — End: 2013-05-20
  Filled 2013-05-20: qty 1

## 2013-05-20 MED ORDER — MIDAZOLAM HCL 2 MG/2ML IJ SOLN
INTRAMUSCULAR | Status: AC
  Filled 2013-05-20: qty 4

## 2013-05-20 MED ORDER — FENTANYL CITRATE 0.05 MG/ML IJ SOLN
INTRAMUSCULAR | Status: AC | PRN
  Administered 2013-05-20 (×2): 50 ug via INTRAVENOUS

## 2013-05-20 MED ORDER — IOHEXOL 300 MG/ML  SOLN
50.0000 mL | Freq: Once | INTRAMUSCULAR | Status: AC | PRN
Start: 2013-05-20 — End: 2013-05-20
  Administered 2013-05-20: 20 mL

## 2013-05-20 NOTE — Progress Notes (Signed)
[place] [facility]   Name: [name] MRN: [id number] DOB: 1932/11/11    ADMISSION DATE:  05/05/2013 CONSULTATION DATE:  3/5   REFERRING MD :  Hijazi  PRIMARY SERVICE:  Hijazi   CHIEF COMPLAINT:  Vent management   BRIEF PATIENT DESCRIPTION:  80 yo with left subclavian and axillary vein thrombosis documented 2/24 on anticoagulation transferred from [facility] on 3/2 in hemorrhagic shock and acute blood loss anemia. Her coagulopathy was reversed, she was then stabilized and moved back to [facility] on 3/5 to resume weaning efforts. Marland Kitchen   SIGNIFICANT EVENTS / STUDIES:  3/2 Given Vit K, FFP x 1, PRBC x 2  3/2 Seen by GI >>> No indication for EGD at this time  3/2 CT abdomen / pelvis >>> MODERATE RETROPERITONEAL HEMORRHAGE L psoas / iliacus muscle, adrenal mass, stable cystic adnexal changes  3/16 has refused peg. Looks worse  LINES / TUBES:  Trach 2/18 >>>  LUE PICC 2/20 >>>   CULTURES:  3/2 MRSA PCR >>> neg  3/2 Clostridium difficile PCR >>> neg  3/3 Blood >>> neg 3/3 Urine >>> mult morph>>>recullect  3/9 urine>>> yeast  ANTIBIOTICS:  Per ssh    SUBJECTIVE:  No events overnight, tolerating weaning protocol for now.  VITAL SIGNS: Vital signs reviewed. Abnormal values will appear under impression plan section.   PHYSICAL EXAMINATION:  General: Appears chronically ill, no respiratory distress .  Weak Neuro: lethargic HEENT: Tracheostomy site without bleeding / inflammation /ogt Cardiovascular: RRR, no murmurs  Lungs: Bilateral air entry equal. Scattered rhonchi.  Abdomen: Soft, non tender, bowel sounds present  Musculoskeletal: Anasarca, ecchymoses upper extremities bilaterally, L > R BUE edema  Recent Labs Lab 05/16/13 0650 05/17/13 0525 05/20/13 0630  NA 139 143 138  K 3.6* 3.6* 4.9  CL 105 109 99  CO2 24 23 28   BUN 26* 36* 45*  CREATININE 0.96 1.17* 0.97  GLUCOSE 126* 178* 208*    Recent Labs Lab 05/16/13 0650 05/17/13 0525 05/20/13 0630  HGB 8.3* 7.5* 8.1*  HCT  27.4* 24.6* 26.6*  WBC 11.4* 10.5 8.6  PLT PLATELET CLUMPING, SUGGEST RECOLLECTION OF SAMPLE IN CITRATE TUBE. PLATELET CLUMPING, SUGGEST RECOLLECTION OF SAMPLE IN CITRATE TUBE. PLATELET CLUMPING, SUGGEST RECOLLECTION OF SAMPLE IN CITRATE TUBE.   Dg Chest Port 1 View  05/20/2013   CLINICAL DATA:  Respiratory failure  EXAM: PORTABLE CHEST - 1 VIEW  COMPARISON:  May 17, 2013  FINDINGS: Tracheostomy tube tip is 5.2 cm above the carina. Central catheter tip is at the cavoatrial junction. Nasogastric tube tip and side port are below the stomach. No pneumothorax. There is interstitial and patchy alveolar edema bilaterally, stable. There are small effusions. Heart size and pulmonary vascularity are normal. Pacemaker leads are unchanged in position.  IMPRESSION: Tube and catheter positions as described without pneumothorax. Interstitial and alveolar edema. Question a degree of congestive heart failure. ARDS superimposed is also a consideration. Both entities may exist concurrently.   Electronically Signed   By: Bretta Bang M.D.   On: 05/20/2013 07:55   Dg Abd Portable 1v  05/20/2013   CLINICAL DATA:  Evaluate barium prior to potential gastrostomy tube placement.  EXAM: PORTABLE ABDOMEN - 1 VIEW  COMPARISON:  DG ABD PORTABLE 1V dated 05/14/2013; CT ABD/PELVIS W CM dated 05/12/2013  FINDINGS: Administered enteric contrast extends to the level of the cecum and ascending colon. There is a moderate amount of residual barium within the gastric antrum.  No evidence of enteric obstruction.  Enteric tube overlies  the gastric fundus.  Nondiagnostic evaluation for pneumoperitoneum. No definite pneumatosis or portal venous gas.  No acute osseus abnormalities.  IMPRESSION: 1. Enteric contrast extends the level of the cecum and ascending colon. 2. Moderate amount of retained barium within the gastric antrum.   Electronically Signed   By: Simonne ComeJohn  Watts M.D.   On: 05/20/2013 07:55   ASSESSMENT / PLAN: Acute on chronic  respiratory failure - post ARDS pulmonary fibrosis - s/p trach  ?HCAP  Recommendations  - Decrease PEEP to 5 and FiO2 to 40% and begin weaning, anticipate 12/5 for two hours today. - F/U CXR  - Mobilize as able. - Minimize sedation as that is intervening with rehab which is a contributing cause to this fluctuating hypoxia requiring high PEEP.   LUE DVT  Resolved Retroperitoneal bleed, w/ resultant hemorrhagic shock in setting anticoagulation for LUE DVT  Recommendations  - Monitor cbc  - NO anticoagulation  - F/u coags   Hypernatremia Protein calorie malnutrition  Hyperthyroid  Diabetes  Deconditioning  recommendations  - Needs Peg 3/13, refused peg has ogt  Global: Her prognosis remains poor and would recommend speaking with family regarding realistic expectation as I anticipate will need vent SNF if survives this at all.  Alyson ReedyWesam G. Tsutomu Barfoot, M.D. [facility]. Pager: [phone]. After hours pager: [phone].

## 2013-05-20 NOTE — Progress Notes (Signed)
[facility]                                                                                              Progress note     Patient Demographics  [name], is a 80 y.o. female  KAJ:[id number]  MBT:[id number]  DOB - 13-Dec-1932  Admit date - 05/05/2013  Admitting Physician Carron Curie, MD  Outpatient Primary MD for the patient is [name], MD  LOS - 15   CC : Respiratory failure          Norovirus         Retroperitoneal hematoma         DVT                                                                                                                                                                                                         Subjective:   [name] obtunded and cannot give any history  Objective:   Vital signs  Temperature 97.0 Heart rate 75 Respiratory rate 24 Blood pressure 108/89 Pulse ox 99%    Exam Obtunded  [place].AT,PERRAL NG tube noted in place Supple Neck,No JVD, No cervical lymphadenopathy appriciated. Tracheostomy noted  Symmetrical Chest wall movement, decreased breath sounds bilaterally basally RRR,No Gallops,Rubs or new Murmurs, No Parasternal Heave +ve B.Sounds, Abd Soft, mild generalized tenderness, No organomegaly appriciated, No rebound - guarding or rigidity. No Cyanosis, Clubbing or edema, No new Rash or bruise     I&Os 2150/3000 Foley yes Trach yes  Data Review   CBC  Recent Labs Lab 05/15/13 0530 05/16/13 0650 05/17/13 0525 05/20/13 0630  WBC 10.6* 11.4* 10.5 8.6  HGB 8.8* 8.3* 7.5* 8.1*  HCT 28.4* 27.4* 24.6* 26.6*  PLT 110* PLATELET CLUMPING, SUGGEST RECOLLECTION OF SAMPLE IN CITRATE TUBE. PLATELET CLUMPING, SUGGEST RECOLLECTION OF SAMPLE IN CITRATE TUBE. PLATELET CLUMPING, SUGGEST RECOLLECTION OF SAMPLE IN CITRATE TUBE.  MCV 101.4* 101.5* 101.2* 98.5  MCH 31.4 30.7 30.9 30.0  MCHC 31.0 30.3 30.5 30.5  RDW  19.9* 19.6* 19.5* 18.7*  LYMPHSABS 1.6  --   --   --   MONOABS 0.7  --   --   --  EOSABS 0.1  --   --   --   BASOSABS 0.0  --   --   --     Chemistries   Recent Labs Lab 05/14/13 1422 05/15/13 0530 05/16/13 0650 05/17/13 0525 05/20/13 0630  NA 141 141 139 143 138  K 3.5* 3.8 3.6* 3.6* 4.9  CL 109 107 105 109 99  CO2 24 21 24 23 28   GLUCOSE 146* 195* 126* 178* 208*  BUN 15 17 26* 36* 45*  CREATININE 0.71 0.65 0.96 1.17* 0.97  CALCIUM 7.1* 7.4* 7.5* 7.5* 7.5*   Coagulation profile No results found for this basename: INR, PROTIME,  in the last 168 hours  Chest x-ray: 3/20-ARDS/congestive heart failure    Assessment & Plan  Ventilator dependent respiratory failure/ARDS/congestive heart failure  Hemorrhagic shock; reversed by fresh frozen plasma vitamin K and blood transfusion /resolved Left pneumothorax status post chest tube placement and removal SIRS with yeast in the urine continue with IV meropenem/Diflucan Hypernatremia; resolved Hypertension; controlled A. fib controlled rate  Protein calorie malnutrition; continue with Vital 1.5 there NG tube Generalized weakness; PT/ OT  Delerium Retroperitoneal bleed History of norovirus infection, resolved Urinary tract infection; start Diflucan for yeast Diabetes mellitus; fair control  Plan Lasix 40 mg IV now Critical care time 36 minutes Code Status:  full  Family Communication: Discussed with family who was at bedside  Consults  PCCM. and ID  Antibiotics meropenem, Diflucan  DVT Prophylaxis  SCDs   Carron CurieHijazi, Adrine Hayworth M.D on 05/20/2013 at 12:12 PM

## 2013-05-20 NOTE — Procedures (Signed)
Procedure:  Gastrostomy  Findings:  20 Fr bumper retention gastrostomy tube placed with tip in body of stomach.  OK to use for feeds tomorrow.

## 2013-05-21 NOTE — Progress Notes (Addendum)
[facility]                                                                                              Progress note     Patient Demographics  Vincie Theroux, is a 80 y.o. female  GEZ:[id number]  [id number]:[id number]  DOB - 1932-10-19  Admit date - 05/05/2013  Admitting Physician Carron Curie, MD  Outpatient Primary MD for the patient is [name], MD  LOS - 16   CC : Respiratory failure          Norovirus         Retroperitoneal hematoma         DVT                                                                                                                                                                                                         Subjective:   [name] obtunded and cannot give any history  Objective:   Vital signs  Temperature 95 Heart rate 82 Respiratory rate 22 Blood pressure 125/40 Pulse ox 97%    Exam Obtunded  [place].AT,PERRAL NG tube noted in place Supple Neck,No JVD, No cervical lymphadenopathy appriciated. Tracheostomy noted  Symmetrical Chest wall movement, decreased breath sounds bilaterally basally RRR,No Gallops,Rubs or new Murmurs, No Parasternal Heave +ve B.Sounds, Abd Soft, mild generalized tenderness, No organomegaly appriciated, No rebound - guarding or rigidity. No Cyanosis, Clubbing or edema, No new Rash or bruise     I&Os 1640/1550 Foley yes Trach yes  Data Review   CBC  Recent Labs Lab 05/15/13 0530 05/16/13 0650 05/17/13 0525 05/20/13 0630  WBC 10.6* 11.4* 10.5 8.6  HGB 8.8* 8.3* 7.5* 8.1*  HCT 28.4* 27.4* 24.6* 26.6*  PLT 110* PLATELET CLUMPING, SUGGEST RECOLLECTION OF SAMPLE IN CITRATE TUBE. PLATELET CLUMPING, SUGGEST RECOLLECTION OF SAMPLE IN CITRATE TUBE. PLATELET CLUMPING, SUGGEST RECOLLECTION OF SAMPLE IN CITRATE TUBE.  MCV 101.4* 101.5* 101.2* 98.5  MCH 31.4 30.7 30.9 30.0  MCHC 31.0 30.3 30.5 30.5  RDW 19.9*  19.6* 19.5* 18.7*  LYMPHSABS 1.6  --   --   --   MONOABS 0.7  --   --   --  EOSABS 0.1  --   --   --   BASOSABS 0.0  --   --   --     Chemistries   Recent Labs Lab 05/14/13 1422 05/15/13 0530 05/16/13 0650 05/17/13 0525 05/20/13 0630  NA 141 141 139 143 138  K 3.5* 3.8 3.6* 3.6* 4.9  CL 109 107 105 109 99  CO2 24 21 24 23 28   GLUCOSE 146* 195* 126* 178* 208*  BUN 15 17 26* 36* 45*  CREATININE 0.71 0.65 0.96 1.17* 0.97  CALCIUM 7.1* 7.4* 7.5* 7.5* 7.5*   Coagulation profile No results found for this basename: INR, PROTIME,  in the last 168 hours  Chest x-ray: 3/20-ARDS/congestive heart failure    Assessment & Plan  Ventilator dependent respiratory failure/ARDS/congestive heart failure continue with weaning trials Hemorrhagic shock; reversed by fresh frozen plasma vitamin K and blood transfusion  Left pneumothorax status post chest tube placement and removal SIRS with yeast in the urine continue with IV meropenem/Diflucan Hypernatremia; resolved Hypertension; controlled A. fib controlled rate  Protein calorie malnutrition; continue with Vital 1.5 there NG tube Generalized weakness; PT/ OT  Delerium Retroperitoneal bleed History of norovirus infection, resolved Urinary tract infection; start Diflucan for yeast Diabetes mellitus; fair control  Plan Labs and portable chest x-ray in a.m.  Code Status:  full Family Communication: Discussed with family who was at bedside Consults  PCCM. and ID Antibiotics meropenem, Diflucan DVT Prophylaxis  SCDs   Carron CurieHijazi, Ashyr Hedgepath M.D on 05/21/2013 at 1:11 PM

## 2013-05-22 ENCOUNTER — Other Ambulatory Visit (HOSPITAL_COMMUNITY): Payer: Self-pay

## 2013-05-22 LAB — CBC
HEMATOCRIT: 27.7 % — AB (ref 36.0–46.0)
Hemoglobin: 8.5 g/dL — ABNORMAL LOW (ref 12.0–15.0)
MCH: 29.8 pg (ref 26.0–34.0)
MCHC: 30.7 g/dL (ref 30.0–36.0)
MCV: 97.2 fL (ref 78.0–100.0)
Platelets: 146 10*3/uL — ABNORMAL LOW (ref 150–400)
RBC: 2.85 MIL/uL — ABNORMAL LOW (ref 3.87–5.11)
RDW: 18.5 % — AB (ref 11.5–15.5)
WBC: 13.8 10*3/uL — ABNORMAL HIGH (ref 4.0–10.5)

## 2013-05-22 LAB — URINALYSIS, ROUTINE W REFLEX MICROSCOPIC
Bilirubin Urine: NEGATIVE
Glucose, UA: NEGATIVE mg/dL
Hgb urine dipstick: NEGATIVE
KETONES UR: NEGATIVE mg/dL
NITRITE: NEGATIVE
PH: 6 (ref 5.0–8.0)
Protein, ur: 30 mg/dL — AB
Specific Gravity, Urine: 1.016 (ref 1.005–1.030)
Urobilinogen, UA: 0.2 mg/dL (ref 0.0–1.0)

## 2013-05-22 LAB — BASIC METABOLIC PANEL
BUN: 62 mg/dL — AB (ref 6–23)
CO2: 32 mEq/L (ref 19–32)
Calcium: 7.9 mg/dL — ABNORMAL LOW (ref 8.4–10.5)
Chloride: 94 mEq/L — ABNORMAL LOW (ref 96–112)
Creatinine, Ser: 0.9 mg/dL (ref 0.50–1.10)
GFR calc Af Amer: 69 mL/min — ABNORMAL LOW (ref >90)
GFR, EST NON AFRICAN AMERICAN: 59 mL/min — AB (ref >90)
Glucose, Bld: 245 mg/dL — ABNORMAL HIGH (ref 70–99)
Potassium: 3.4 mEq/L — ABNORMAL LOW (ref 3.7–5.3)
Sodium: 136 mEq/L — ABNORMAL LOW (ref 137–147)

## 2013-05-22 LAB — URINE MICROSCOPIC-ADD ON

## 2013-05-22 NOTE — Progress Notes (Signed)
[facility]                                                                                              Progress note     Patient Demographics  [name], is a 80 y.o. female  JKQ:[id number]  [id number]  DOB - 05-08-1932  Admit date - 05/05/2013  Admitting Physician Carron Curie, MD  Outpatient Primary MD for the patient is [name], MD  LOS - 17   CC : Respiratory failure          Norovirus         Retroperitoneal hematoma         DVT                                                                                                                                                                                                         Subjective:   [name] obtunded and cannot give any history  Objective:   Vital signs  Temperature 96.7 Heart rate 70  Respiratory rate 21 Blood pressure 165/45 Pulse ox 100%    Exam Obtunded  [place].AT,PERRAL NG tube noted in place Supple Neck,No JVD, No cervical lymphadenopathy appriciated. Tracheostomy noted  Symmetrical Chest wall movement, decreased breath sounds bilaterally basally RRR,No Gallops,Rubs or new Murmurs, No Parasternal Heave +ve B.Sounds, Abd Soft, mild generalized tenderness, No organomegaly appriciated, No rebound - guarding or rigidity. No Cyanosis, Clubbing or edema, No new Rash or bruise     I&Os 1825/3600 Foley yes Trach 6 SH placed on 2/28  Data Review   CBC  Recent Labs Lab 05/16/13 0650 05/17/13 0525 05/20/13 0630 05/22/13 0528  WBC 11.4* 10.5 8.6 13.8*  HGB 8.3* 7.5* 8.1* 8.5*  HCT 27.4* 24.6* 26.6* 27.7*  PLT PLATELET CLUMPING, SUGGEST RECOLLECTION OF SAMPLE IN CITRATE TUBE. PLATELET CLUMPING, SUGGEST RECOLLECTION OF SAMPLE IN CITRATE TUBE. PLATELET CLUMPING, SUGGEST RECOLLECTION OF SAMPLE IN CITRATE TUBE. 146*  MCV 101.5* 101.2* 98.5 97.2  MCH 30.7 30.9 30.0 29.8  MCHC 30.3 30.5  30.5 30.7  RDW 19.6* 19.5* 18.7* 18.5*    Chemistries   Recent Labs Lab 05/16/13 0650 05/17/13 0525 05/20/13 0630 05/22/13  0528  NA 139 143 138 136*  K 3.6* 3.6* 4.9 3.4*  CL 105 109 99 94*  CO2 24 23 28  32  GLUCOSE 126* 178* 208* 245*  BUN 26* 36* 45* 62*  CREATININE 0.96 1.17* 0.97 0.90  CALCIUM 7.5* 7.5* 7.5* 7.9*   Coagulation profile No results found for this basename: INR, PROTIME,  in the last 168 hours  Chest x-ray: 3/20-ARDS/congestive heart failure    Assessment & Plan  Ventilator dependent respiratory failure/ARDS/congestive heart failure continue with weaning trials Hemorrhagic shock; reversed by fresh frozen plasma vitamin K and blood transfusion  Left pneumothorax status post chest tube placement and removal SIRS with yeast in the urine continue with IV meropenem/Diflucan, hypothermia Hypernatremia; resolved Hypertension; controlled A. fib controlled rate  Protein calorie malnutrition; continue with Vital 1.5 there NG tube Generalized weakness; PT/ OT  Delerium Retroperitoneal bleed History of norovirus infection, resolved Urinary tract infection; start Diflucan for yeast Diabetes mellitus; fair control Hypokalemia  Plan Tracheal aspirate deep culture Check pacemaker in a.m. Replace potassium  Code Status:  full Family Communication: Discussed with family who was at bedside Consults  [name]. and ID Antibiotics meropenem, Diflucan DVT Prophylaxis  SCDs   Carron CurieHijazi, Imran Nuon M.D on 05/22/2013 at 12:49 PM

## 2013-05-23 LAB — URINE CULTURE: Colony Count: 70000

## 2013-05-23 NOTE — Progress Notes (Signed)
[place] [facility]   Name: [name] MRN: [id number] DOB: [birth year]    ADMISSION DATE:  05/05/2013 CONSULTATION DATE:  3/5   REFERRING MD :  Hijazi  PRIMARY SERVICE:  Hijazi   CHIEF COMPLAINT:  Vent management   BRIEF PATIENT DESCRIPTION:  80 yo with left subclavian and axillary vein thrombosis documented 2/24 on anticoagulation transferred from [facility] on 3/2 in hemorrhagic shock and acute blood loss anemia. Her coagulopathy was reversed, she was then stabilized and moved back to [facility] on 3/5 to resume weaning efforts. Marland Kitchen   SIGNIFICANT EVENTS / STUDIES:  3/2 Given Vit K, FFP x 1, PRBC x 2  3/2 Seen by GI >>> No indication for EGD at this time  3/2 CT abdomen / pelvis >>> MODERATE RETROPERITONEAL HEMORRHAGE L psoas / iliacus muscle, adrenal mass, stable cystic adnexal changes  3/16 has refused peg. Looks worse  LINES / TUBES:  Trach 2/18 >>>  LUE PICC 2/20 >>>   CULTURES:  3/2 MRSA PCR >>> neg  3/2 Clostridium difficile PCR >>> neg  3/3 Blood >>> neg 3/3 Urine >>> mult morph>>>recullect  3/9 urine>>> yeast  ANTIBIOTICS:  Per ssh    SUBJECTIVE:  No events overnight, tolerating weaning protocol for now.  VITAL SIGNS: Vital signs reviewed. Abnormal values will appear under impression plan section.   PHYSICAL EXAMINATION:  General: Appears chronically ill, no respiratory distress .  Weak Neuro: lethargic HEENT: Tracheostomy site without bleeding / inflammation /ogt Cardiovascular: RRR, no murmurs  Lungs: Bilateral air entry equal. Scattered rhonchi.  Abdomen: Soft, non tender, bowel sounds present  Musculoskeletal: Anasarca, ecchymoses upper extremities bilaterally, L > R BUE edema  Recent Labs Lab 05/17/13 0525 05/20/13 0630 05/22/13 0528  NA 143 138 136*  K 3.6* 4.9 3.4*  CL 109 99 94*  CO2 23 28 32  BUN 36* 45* 62*  CREATININE 1.17* 0.97 0.90  GLUCOSE 178* 208* 245*    Recent Labs Lab 05/17/13 0525 05/20/13 0630 05/22/13 0528  HGB 7.5* 8.1* 8.5*  HCT  24.6* 26.6* 27.7*  WBC 10.5 8.6 13.8*  PLT PLATELET CLUMPING, SUGGEST RECOLLECTION OF SAMPLE IN CITRATE TUBE. PLATELET CLUMPING, SUGGEST RECOLLECTION OF SAMPLE IN CITRATE TUBE. 146*   Dg Chest Port 1 View  05/22/2013   CLINICAL DATA:  Respiratory failure  EXAM: PORTABLE CHEST - 1 VIEW  COMPARISON:  Portable exam 0648 hr compared to 05/20/2013  FINDINGS: Tracheostomy tube projects over tracheal air column.  Right arm PICC line tip projects over SVC.  Left subclavian transvenous pacemaker leads project over right atrium, right ventricle and coronary sinus.  Upper normal heart size.  Bilateral airspace infiltrates little changed.  Probable small right pleural effusion.  No pneumothorax.  IMPRESSION: Persistent bilateral airspace infiltrates M small probable right pleural effusion.   Electronically Signed   By: Ulyses Southward M.D.   On: 05/22/2013 11:03   ASSESSMENT / PLAN: Acute on chronic respiratory failure - post ARDS pulmonary fibrosis - s/p trach  ?HCAP  Recommendations  - Maintain PEEP at 5 and decrease FiO2 to 35%, will begin PS trials today, goal is 4 hours of PS with FiO2 of 35%. - F/U CXR  - Mobilize as able. - Minimize sedation as that is intervening with rehab which is a contributing cause to this fluctuating hypoxia requiring high PEEP.   LUE DVT  Resolved Retroperitoneal bleed, w/ resultant hemorrhagic shock in setting anticoagulation for LUE DVT  Recommendations  - Monitor cbc  - NO anticoagulation  - F/u  coags   Hypernatremia Protein calorie malnutrition  Hyperthyroid  Diabetes  Deconditioning  recommendations  - Needs Peg 3/13, refused peg has ogt  Global: Her prognosis remains poor and would recommend speaking with family regarding realistic expectation as I anticipate will need vent SNF if survives this at all.  Alyson ReedyWesam G. Yacoub, M.D. [facility]. Pager: [phone]. After hours pager: [phone].

## 2013-05-24 LAB — CBC
HCT: 32 % — ABNORMAL LOW (ref 36.0–46.0)
Hemoglobin: 9.6 g/dL — ABNORMAL LOW (ref 12.0–15.0)
MCH: 29.3 pg (ref 26.0–34.0)
MCHC: 30 g/dL (ref 30.0–36.0)
MCV: 97.6 fL (ref 78.0–100.0)
PLATELETS: 210 10*3/uL (ref 150–400)
RBC: 3.28 MIL/uL — ABNORMAL LOW (ref 3.87–5.11)
RDW: 18.4 % — ABNORMAL HIGH (ref 11.5–15.5)
WBC: 17.4 10*3/uL — ABNORMAL HIGH (ref 4.0–10.5)

## 2013-05-24 LAB — BASIC METABOLIC PANEL
BUN: 73 mg/dL — AB (ref 6–23)
CO2: 37 mEq/L — ABNORMAL HIGH (ref 19–32)
Calcium: 8.3 mg/dL — ABNORMAL LOW (ref 8.4–10.5)
Chloride: 99 mEq/L (ref 96–112)
Creatinine, Ser: 0.98 mg/dL (ref 0.50–1.10)
GFR calc Af Amer: 62 mL/min — ABNORMAL LOW (ref >90)
GFR calc non Af Amer: 53 mL/min — ABNORMAL LOW (ref >90)
GLUCOSE: 170 mg/dL — AB (ref 70–99)
POTASSIUM: 3.6 meq/L — AB (ref 3.7–5.3)
Sodium: 147 mEq/L (ref 137–147)

## 2013-05-25 DIAGNOSIS — I4891 Unspecified atrial fibrillation: Secondary | ICD-10-CM

## 2013-05-25 LAB — T4, FREE: Free T4: 1.57 ng/dL (ref 0.80–1.80)

## 2013-05-25 LAB — T3, FREE: T3, Free: 1.6 pg/mL — ABNORMAL LOW (ref 2.3–4.2)

## 2013-05-25 LAB — TSH: TSH: 0.549 u[IU]/mL (ref 0.350–4.500)

## 2013-05-25 NOTE — Progress Notes (Signed)
[place] PCCM   Name: JAMIRIA HANNOLD MRN: [id number] DOB: [birth year]    ADMISSION DATE:  05/05/2013 CONSULTATION DATE:  3/5   REFERRING MD :  Hijazi  PRIMARY SERVICE:  Hijazi   CHIEF COMPLAINT:  Vent management   BRIEF PATIENT DESCRIPTION:  80 yo with left subclavian and axillary vein thrombosis documented 2/24 on anticoagulation transferred from [facility] on 3/2 in hemorrhagic shock and acute blood loss anemia. Her coagulopathy was reversed, she was then stabilized and moved back to [facility] on 3/5 to resume weaning efforts. Marland Kitchen   SIGNIFICANT EVENTS / STUDIES:  3/2 Given Vit K, FFP x 1, PRBC x 2  3/2 Seen by GI >>> No indication for EGD at this time  3/2 CT abdomen / pelvis >>> MODERATE RETROPERITONEAL HEMORRHAGE L psoas / iliacus muscle, adrenal mass, stable cystic adnexal changes  3/16 has refused peg. Looks worse  LINES / TUBES:  Trach 2/18 >>>  LUE PICC 2/20 >>>   CULTURES:  3/2 MRSA PCR >>> neg  3/2 Clostridium difficile PCR >>> neg  3/3 Blood >>> neg 3/3 Urine >>> mult morph>>>recullect  3/9 urine>>> yeast 3/22 resp culture: Pseudomonas (resist cipro, intermediate cefepime)  ANTIBIOTICS:  Per ssh    SUBJECTIVE:  No events overnight, tolerating weaning protocol for now, but not really advancing   VITAL SIGNS: Vital signs reviewed. Abnormal values will appear under impression plan section.   PHYSICAL EXAMINATION:  General: Appears chronically ill, no respiratory distress .  Weak Neuro: lethargic HEENT: Tracheostomy site without bleeding / inflammation /ogt Cardiovascular: RRR, no murmurs  Lungs: Bilateral air entry equal. Scattered rhonchi.  Abdomen: Soft, non tender, bowel sounds present  Musculoskeletal: Anasarca, ecchymoses upper extremities bilaterally, L > R BUE edema  Recent Labs Lab 05/20/13 0630 05/22/13 0528 05/24/13 0500  NA 138 136* 147  K 4.9 3.4* 3.6*  CL 99 94* 99  CO2 28 32 37*  BUN 45* 62* 73*  CREATININE 0.97 0.90 0.98  GLUCOSE 208* 245* 170*     Recent Labs Lab 05/20/13 0630 05/22/13 0528 05/24/13 0500  HGB 8.1* 8.5* 9.6*  HCT 26.6* 27.7* 32.0*  WBC 8.6 13.8* 17.4*  PLT PLATELET CLUMPING, SUGGEST RECOLLECTION OF SAMPLE IN CITRATE TUBE. 146* 210   No results found. ASSESSMENT / PLAN:  Acute on chronic respiratory failure - post ARDS pulmonary fibrosis - s/p trach  Pseudomonas HCAP >efforts acceptable on PSV of 12 but she is very debilitated.  Recommendations  - Cont weaning trials, not done well, today again PS 12 best - Mobilize as able. - Minimize sedation -wean steroids to off. Not sure of efficacy in this setting  -have maxed efforts for lasix -needs vent SNF  Yeast UTI Plan Per primary svc Diflucan (3/17-3/24) UA repeat neg  LUE DVT  Resolved Retroperitoneal bleed, w/ resultant hemorrhagic shock in setting anticoagulation for LUE DVT  Recommendations  - Monitor cbc  - NO anticoagulation  - F/u coags   Hypernatremia Protein calorie malnutrition  Hyperthyroid  Diabetes  Deconditioning  recommendations  - Needs Peg 3/13, refused peg has ogt  Global: Her prognosis remains poor and would recommend speaking with family regarding realistic expectation as I anticipate will need vent SNF if survives this at all.  Mcarthur Rossetti. Tyson Alias, MD, FACP Pgr: [phone] [place] [facility]

## 2013-05-26 DIAGNOSIS — I442 Atrioventricular block, complete: Secondary | ICD-10-CM

## 2013-05-26 LAB — BASIC METABOLIC PANEL
BUN: 80 mg/dL — ABNORMAL HIGH (ref 6–23)
CALCIUM: 7.9 mg/dL — AB (ref 8.4–10.5)
CO2: 39 mEq/L — ABNORMAL HIGH (ref 19–32)
CREATININE: 1 mg/dL (ref 0.50–1.10)
Chloride: 99 mEq/L (ref 96–112)
GFR calc non Af Amer: 52 mL/min — ABNORMAL LOW (ref >90)
GFR, EST AFRICAN AMERICAN: 60 mL/min — AB (ref >90)
Glucose, Bld: 193 mg/dL — ABNORMAL HIGH (ref 70–99)
Potassium: 3.5 mEq/L — ABNORMAL LOW (ref 3.7–5.3)
Sodium: 149 mEq/L — ABNORMAL HIGH (ref 137–147)

## 2013-05-26 LAB — CBC
HCT: 32.7 % — ABNORMAL LOW (ref 36.0–46.0)
Hemoglobin: 9.8 g/dL — ABNORMAL LOW (ref 12.0–15.0)
MCH: 29.6 pg (ref 26.0–34.0)
MCHC: 30 g/dL (ref 30.0–36.0)
MCV: 98.8 fL (ref 78.0–100.0)
PLATELETS: 203 10*3/uL (ref 150–400)
RBC: 3.31 MIL/uL — ABNORMAL LOW (ref 3.87–5.11)
RDW: 17.8 % — AB (ref 11.5–15.5)
WBC: 17.4 10*3/uL — ABNORMAL HIGH (ref 4.0–10.5)

## 2013-05-26 NOTE — Progress Notes (Signed)
[place] P[facility]   Name: JAIMI LAESSIG MRN: [id number] DOB: 21-Sep-1932    ADMISSION DATE:  05/05/2013 CONSULTATION DATE:  3/5   REFERRING MD :  Hijazi  PRIMARY SERVICE:  Hijazi   CHIEF COMPLAINT:  Vent management   BRIEF PATIENT DESCRIPTION:  80 yo with left subclavian and axillary vein thrombosis documented 2/24 on anticoagulation transferred from [facility] on 3/2 in hemorrhagic shock and acute blood loss anemia. Her coagulopathy was reversed, she was then stabilized and moved back to [facility] on 3/5 to resume weaning efforts. Marland Kitchen   SIGNIFICANT EVENTS / STUDIES:  3/2 Given Vit K, FFP x 1, PRBC x 2  3/2 Seen by GI >>> No indication for EGD at this time  3/2 CT abdomen / pelvis >>> MODERATE RETROPERITONEAL HEMORRHAGE L psoas / iliacus muscle, adrenal mass, stable cystic adnexal changes  3/16 has refused peg. Looks worse  LINES / TUBES:  Trach 2/18 >>>  LUE PICC 2/20 >>>   CULTURES:  3/2 MRSA PCR >>> neg  3/2 Clostridium difficile PCR >>> neg  3/3 Blood >>> neg 3/3 Urine >>> mult morph>>>recullect  3/9 urine>>> yeast 3/22 resp culture: Pseudomonas (resist cipro, intermediate cefepime)  ANTIBIOTICS:  Per ssh    SUBJECTIVE:  Poor weaning continues  VITAL SIGNS: Vital signs reviewed.   PHYSICAL EXAMINATION:  General: Appears chronically ill, no respiratory distress .  Weak Neuro: lethargic but opens eyes HEENT: Tracheostomy site without bleeding / inflammation /ogt Cardiovascular: RRR, no murmurs  Lungs: ronchi Abdomen: Soft, non tender, bowel sounds present  Musculoskeletal: Anasarca, ecchymoses upper extremities bilaterally, L > R BUE edema  Recent Labs Lab 05/22/13 0528 05/24/13 0500 05/26/13 0600  NA 136* 147 149*  K 3.4* 3.6* 3.5*  CL 94* 99 99  CO2 32 37* 39*  BUN 62* 73* 80*  CREATININE 0.90 0.98 1.00  GLUCOSE 245* 170* 193*    Recent Labs Lab 05/22/13 0528 05/24/13 0500 05/26/13 0600  HGB 8.5* 9.6* 9.8*  HCT 27.7* 32.0* 32.7*  WBC 13.8* 17.4* 17.4*  PLT  146* 210 203   No results found. ASSESSMENT / PLAN:  Acute on chronic respiratory failure - post ARDS pulmonary fibrosis - s/p trach  Pseudomonas HCAP  Recommendations  -continued failed weaning, requires PS 12, continued ARDS, infiltrates -I had a long d/w husband, recommend comfort care as at her age unlikely to liberate from vent and lend to poor quality -other option is Vent SNF -her pseudomonas is a concern for successful treatment as well, consider ID Residual infiltrates with this hospital course lends to poor prognosis -continue to PS goal 2-4 hrs -neg balance when able  LUE DVT  Resolved Retroperitoneal bleed, w/ resultant hemorrhagic shock in setting anticoagulation for LUE DVT  Recommendations  - unable to anticoagulate with RP bleed   Mcarthur Rossetti. Tyson Alias, MD, FACP Pgr: [phone] [place] [facility]

## 2013-05-27 LAB — CULTURE, RESPIRATORY

## 2013-05-27 LAB — BASIC METABOLIC PANEL
BUN: 80 mg/dL — ABNORMAL HIGH (ref 6–23)
CALCIUM: 8.2 mg/dL — AB (ref 8.4–10.5)
CO2: 41 mEq/L (ref 19–32)
Chloride: 100 mEq/L (ref 96–112)
Creatinine, Ser: 0.93 mg/dL (ref 0.50–1.10)
GFR, EST AFRICAN AMERICAN: 66 mL/min — AB (ref >90)
GFR, EST NON AFRICAN AMERICAN: 57 mL/min — AB (ref >90)
Glucose, Bld: 169 mg/dL — ABNORMAL HIGH (ref 70–99)
POTASSIUM: 3.9 meq/L (ref 3.7–5.3)
SODIUM: 149 meq/L — AB (ref 137–147)

## 2013-05-27 LAB — CULTURE, RESPIRATORY W GRAM STAIN

## 2013-05-28 LAB — BASIC METABOLIC PANEL
BUN: 71 mg/dL — ABNORMAL HIGH (ref 6–23)
CHLORIDE: 98 meq/L (ref 96–112)
CO2: 38 mEq/L — ABNORMAL HIGH (ref 19–32)
Calcium: 8.1 mg/dL — ABNORMAL LOW (ref 8.4–10.5)
Creatinine, Ser: 0.9 mg/dL (ref 0.50–1.10)
GFR calc Af Amer: 69 mL/min — ABNORMAL LOW (ref >90)
GFR, EST NON AFRICAN AMERICAN: 59 mL/min — AB (ref >90)
Glucose, Bld: 187 mg/dL — ABNORMAL HIGH (ref 70–99)
POTASSIUM: 3.9 meq/L (ref 3.7–5.3)
SODIUM: 147 meq/L (ref 137–147)

## 2013-05-28 LAB — TOBRAMYCIN LEVEL, RANDOM: TOBRAMYCIN RM: 6.3 ug/mL

## 2013-05-29 LAB — BASIC METABOLIC PANEL
BUN: 67 mg/dL — AB (ref 6–23)
CHLORIDE: 97 meq/L (ref 96–112)
CO2: 38 mEq/L — ABNORMAL HIGH (ref 19–32)
Calcium: 8.1 mg/dL — ABNORMAL LOW (ref 8.4–10.5)
Creatinine, Ser: 0.92 mg/dL (ref 0.50–1.10)
GFR calc Af Amer: 67 mL/min — ABNORMAL LOW (ref >90)
GFR calc non Af Amer: 58 mL/min — ABNORMAL LOW (ref >90)
Glucose, Bld: 215 mg/dL — ABNORMAL HIGH (ref 70–99)
POTASSIUM: 3.6 meq/L — AB (ref 3.7–5.3)
Sodium: 145 mEq/L (ref 137–147)

## 2013-05-29 LAB — BLOOD GAS, ARTERIAL
ACID-BASE EXCESS: 13.3 mmol/L — AB (ref 0.0–2.0)
Bicarbonate: 38.3 mEq/L — ABNORMAL HIGH (ref 20.0–24.0)
FIO2: 0.35 %
MODE: POSITIVE
O2 Saturation: 98.9 %
PEEP: 5 cmH2O
PO2 ART: 111 mmHg — AB (ref 80.0–100.0)
Patient temperature: 98.6
Pressure support: 12 cmH2O
TCO2: 40.1 mmol/L (ref 0–100)
pCO2 arterial: 58.2 mmHg (ref 35.0–45.0)
pH, Arterial: 7.434 (ref 7.350–7.450)

## 2013-05-29 LAB — TOBRAMYCIN LEVEL, TROUGH: TOBRAMYCIN TR: 0.9 ug/mL (ref 0.5–2.0)

## 2013-05-30 LAB — BASIC METABOLIC PANEL
BUN: 65 mg/dL — ABNORMAL HIGH (ref 6–23)
CHLORIDE: 98 meq/L (ref 96–112)
CO2: 35 meq/L — AB (ref 19–32)
Calcium: 8.2 mg/dL — ABNORMAL LOW (ref 8.4–10.5)
Creatinine, Ser: 0.85 mg/dL (ref 0.50–1.10)
GFR calc Af Amer: 74 mL/min — ABNORMAL LOW (ref >90)
GFR calc non Af Amer: 63 mL/min — ABNORMAL LOW (ref >90)
Glucose, Bld: 142 mg/dL — ABNORMAL HIGH (ref 70–99)
Potassium: 5.2 mEq/L (ref 3.7–5.3)
SODIUM: 142 meq/L (ref 137–147)

## 2013-05-30 LAB — TOBRAMYCIN LEVEL, PEAK: Tobramycin Pk: 3 ug/mL — ABNORMAL LOW (ref 5.0–10.0)

## 2013-05-30 LAB — TOBRAMYCIN LEVEL, TROUGH: TOBRAMYCIN TR: 3.1 ug/mL — AB (ref 0.5–2.0)

## 2013-05-30 NOTE — Progress Notes (Signed)
[facility]                                                                                              Progress note     Patient Demographics  [name], is a 80 y.o. female  ZOX:[id number]  WJX:[id number]  DOB - 01/30/1933  Admit date - 05/05/2013  Admitting Physician Carron CurieAli Kataleah Bejar, MD  Outpatient Primary MD for the patient is Astrid DivineGRIFFIN,ELAINE COLLINS, MD  LOS - 25   CC : Respiratory failure          Norovirus         Retroperitoneal hematoma         DVT                                                                                                                                                                                                         Subjective:   Angela NorrisShirley Kyler obtunded and cannot give any history  Objective:   Vital signs  Temperature 97.4 Heart rate 71 Respiratory rate 22 Blood pressure 133/46 Pulse ox 100%    Exam Obtunded  [place].AT,PERRAL NG tube noted in place Supple Neck,No JVD, No cervical lymphadenopathy appriciated. Tracheostomy noted  Symmetrical Chest wall movement, decreased breath sounds bilaterally basally RRR,No Gallops,Rubs or new Murmurs, No Parasternal Heave +ve B.Sounds, Abd Soft, mild generalized tenderness, No organomegaly appriciated, No rebound - guarding or rigidity. PEG tube noted No Cyanosis, Clubbing or edema, No new Rash or bruise     I&Os 2850/1800 Foley yes Trach 6 SH placed on 2/28 PEG tube yes  Data Review   CBC  Recent Labs Lab 05/24/13 0500 05/26/13 0600  WBC 17.4* 17.4*  HGB 9.6* 9.8*  HCT 32.0* 32.7*  PLT 210 203  MCV 97.6 98.8  MCH 29.3 29.6  MCHC 30.0 30.0  RDW 18.4* 17.8*    Chemistries   Recent Labs Lab 05/24/13 0500 05/26/13 0600 05/27/13 0555 05/28/13 0615 05/29/13 0620  NA 147 149* 149* 147 145  K 3.6* 3.5* 3.9 3.9 3.6*  CL 99 99 100 98 97  CO2 37* 39* 41* 38* 38*  GLUCOSE 170* 193* 169*  187* 215*  BUN 73* 80* 80* 71* 67*  CREATININE 0.98 1.00 0.93 0.90 0.92  CALCIUM 8.3* 7.9* 8.2* 8.1* 8.1*   Coagulation profile No results found for this basename: INR, PROTIME,  in the last 168 hours      Assessment & Plan  Ventilator dependent respiratory failure/ARDS/congestive heart failure . Started today on ATC trials Pseudomonas tracheal bronchitis on ceftazidime and tobramycin IV Hemorrhagic shock; reversed by fresh frozen plasma vitamin K and blood transfusion resolved Left pneumothorax status post chest tube placement and removal result, SIRS with yeast in the urine continue with IV meropenem/Diflucan, hypothermia Hypernatremia; resolved Hypertension; controlled A. fib controlled rate  Protein calorie malnutrition; continue with  Glucerna 1.5   Generalized weakness; PT/ OT  Delerium Retroperitoneal bleed History of norovirus infection, resolved Urinary tract infection; start Diflucan for yeast Diabetes mellitus; fair control Hypokalemia  Plan Check labs in a.m.  Code Status:  full  Consults  PCCM. and ID DVT Prophylaxis  SCDs   Carron Curie M.D on 05/30/2013 at 10:52 AM

## 2013-05-30 NOTE — Progress Notes (Signed)
PULMONARY / CRITICAL CARE MEDICINE  Name: [name] MRN: [id number] DOB: 1932-08-06    ADMISSION DATE:  05/05/2013 CONSULTATION DATE:  05/05/2013  REFERRING MD :  Sharyon Medicus  PRIMARY SERVICE:  [facility]  CHIEF COMPLAINT:  Vent management   BRIEF PATIENT DESCRIPTION:  80 yo with left subclavian and axillary vein thrombosis documented 2/24 on anticoagulation transferred from [facility] on 3/2 in hemorrhagic shock and acute blood loss anemia. Her coagulopathy was reversed, she was then stabilized and moved back to [facility] on 3/5 to resume weaning efforts. Marland Kitchen   SIGNIFICANT EVENTS / STUDIES:  3/2 Given Vit K, FFP x 1, PRBC x 2  3/2 Seen by GI >>> No indication for EGD at this time  3/2 CT abdomen / pelvis >>> MODERATE RETROPERITONEAL HEMORRHAGE L psoas / iliacus muscle, adrenal mass, stable cystic adnexal changes  3/16 has refused peg. Looks worse  LINES / TUBES:  Trach 2/18 >>>  LUE PICC 2/20 >>>   CULTURES:  3/2 MRSA PCR >>> neg  3/2 Clostridium difficile PCR >>> neg  3/3 Blood >>> neg 3/3 Urine >>> mult morph>>>recullect  3/9 urine>>> yeast 3/22 resp culture: Pseudomonas (resist cipro, intermediate cefepime)    SUBJECTIVE:  Poor weaning continues  VITAL SIGNS: Vital signs reviewed.   PHYSICAL EXAMINATION:  General: Appears chronically ill, no respiratory distress .  Weak Neuro: lethargic but opens eyes HEENT: Tracheostomy site without bleeding / inflammation /ogt Cardiovascular: RRR, no murmurs  Lungs: ronchi Abdomen: Soft, non tender, bowel sounds present  Musculoskeletal: Anasarca, ecchymoses upper extremities bilaterally, L > R BUE edema  LABS:  Recent Labs Lab 05/28/13 0615 05/29/13 0620 05/30/13 1010  NA 147 145 142  K 3.9 3.6* 5.2  CL 98 97 98  CO2 38* 38* 35*  BUN 71* 67* 65*  CREATININE 0.90 0.92 0.85  GLUCOSE 187* 215* 142*    Recent Labs Lab 05/24/13 0500 05/26/13 0600  HGB 9.6* 9.8*  HCT 32.0* 32.7*  WBC 17.4* 17.4*  PLT 210 203   IMAGING: No results  found.  ASSESSMENT / PLAN:  Acute on chronic respiratory failure Post ARDS pulmonary fibrosis Pseudomonas HCAP  Tracheostomy status DVT s/p IVC filter Retroperitoneal hemorrhage, resolved   Full vent support  Weaning per protocol  May need ventilator facility as poor weaning progress and severe deconditioning  Not a candidate for anticoagulation  I have personally obtained history, examined patient, evaluated and interpreted laboratory and imaging results, reviewed medical records, formulated assessment / plan and placed orders.  Lonia Farber, MD Pulmonary and Critical Care Medicine [facility] Pager: [phone]  05/30/2013, 4:55 PM

## 2013-05-31 ENCOUNTER — Other Ambulatory Visit (HOSPITAL_COMMUNITY): Payer: Self-pay

## 2013-05-31 LAB — BASIC METABOLIC PANEL
BUN: 63 mg/dL — ABNORMAL HIGH (ref 6–23)
CHLORIDE: 93 meq/L — AB (ref 96–112)
CO2: 32 mEq/L (ref 19–32)
Calcium: 8.3 mg/dL — ABNORMAL LOW (ref 8.4–10.5)
Creatinine, Ser: 0.84 mg/dL (ref 0.50–1.10)
GFR calc Af Amer: 75 mL/min — ABNORMAL LOW (ref >90)
GFR calc non Af Amer: 64 mL/min — ABNORMAL LOW (ref >90)
Glucose, Bld: 146 mg/dL — ABNORMAL HIGH (ref 70–99)
POTASSIUM: 4 meq/L (ref 3.7–5.3)
SODIUM: 139 meq/L (ref 137–147)

## 2013-05-31 LAB — CBC
HEMATOCRIT: 31.8 % — AB (ref 36.0–46.0)
HEMOGLOBIN: 10 g/dL — AB (ref 12.0–15.0)
MCH: 30.2 pg (ref 26.0–34.0)
MCHC: 31.4 g/dL (ref 30.0–36.0)
MCV: 96.1 fL (ref 78.0–100.0)
PLATELETS: ADEQUATE 10*3/uL (ref 150–400)
RBC: 3.31 MIL/uL — ABNORMAL LOW (ref 3.87–5.11)
RDW: 16.7 % — ABNORMAL HIGH (ref 11.5–15.5)
WBC: 24.8 10*3/uL — AB (ref 4.0–10.5)

## 2013-05-31 LAB — CLOSTRIDIUM DIFFICILE BY PCR: CDIFFPCR: NEGATIVE

## 2013-05-31 LAB — TOBRAMYCIN LEVEL, RANDOM: Tobramycin Rm: 1.2 ug/mL

## 2013-05-31 NOTE — Progress Notes (Addendum)
[facility]                                                                                              Progress note     Patient Demographics  [name], is a 80 y.o. female  [id number]  [id number]  DOB - 04/08/1932  Admit date - 05/05/2013  Admitting Physician Carron CurieAli Owais Pruett, MD  Outpatient Primary MD for the patient is Astrid DivineGRIFFIN,ELAINE COLLINS, MD  LOS - 26   CC : Respiratory failure          Norovirus         Retroperitoneal hematoma         DVT                                                                                                                                                                                                         Subjective:   Angela NorrisShirley Fitzgerald obtunded and cannot give any history  Objective:   Vital signs  Temperature 96.2 Heart rate 70 Respiratory rate 22 Blood pressure 125/45 Pulse ox 100%    Exam Obtunded  [place].AT,PERRAL NG tube noted in place Supple Neck,No JVD, No cervical lymphadenopathy appriciated. Tracheostomy noted  Symmetrical Chest wall movement, decreased breath sounds bilaterally basally RRR,No Gallops,Rubs or new Murmurs, No Parasternal Heave +ve B.Sounds, Abd Soft, mild generalized tenderness, No organomegaly appriciated, No rebound - guarding or rigidity. PEG tube noted No Cyanosis, Clubbing or edema, No new Rash or bruise     I&Os 3224/2885 Foley yes Trach 6 SH placed on 2/28 PEG tube yes  Data Review   CBC  Recent Labs Lab 05/26/13 0600 05/31/13 0610  WBC 17.4* 24.8*  HGB 9.8* 10.0*  HCT 32.7* 31.8*  PLT 203 PLATELET CLUMPS NOTED ON SMEAR, COUNT APPEARS ADEQUATE  MCV 98.8 96.1  MCH 29.6 30.2  MCHC 30.0 31.4  RDW 17.8* 16.7*    Chemistries   Recent Labs Lab 05/27/13 0555 05/28/13 0615 05/29/13 0620 05/30/13 1010 05/31/13 0610  NA 149* 147 145 142 139  K 3.9 3.9 3.6* 5.2 4.0  CL 100 98 97 98 93*   CO2 41* 38* 38* 35* 32  GLUCOSE 169* 187* 215* 142* 146*  BUN 80* 71* 67* 65* 63*  CREATININE 0.93 0.90 0.92 0.85 0.84  CALCIUM 8.2* 8.1* 8.1* 8.2* 8.3*   Coagulation profile No results found for this basename: INR, PROTIME,  in the last 168 hours      Assessment & Plan  Ventilator dependent respiratory failure/ARDS/congestive heart failure . Continue AtC trials x7 hours today Pseudomonas tracheal bronchitis on ceftazidime and tobramycin IV Hemorrhagic shock; reversed by fresh frozen plasma vitamin K and blood transfusion resolved Left pneumothorax status post chest tube placement and removal result, SIRS with yeast in the urine continue with IV meropenem/Diflucan, hypothermia Hypernatremia; resolved Hypertension; controlled A. fib controlled rate  Protein calorie malnutrition; continue with  Glucerna 1.5   Generalized weakness; PT/ OT  Delerium Retroperitoneal bleed History of norovirus infection, resolved Urinary tract infection; start Diflucan for yeast Diabetes mellitus; fair control Hypokalemia Leukocytosis  Plan Check stool for C. Difficile Start Flagyl IV. Critical care time 32 minutes  Code Status:  full  Consults  PCCM. and ID DVT Prophylaxis  SCDs   Carron Curie M.D on 05/31/2013 at 12:05 PM

## 2013-06-01 ENCOUNTER — Other Ambulatory Visit (HOSPITAL_COMMUNITY): Payer: Self-pay

## 2013-06-01 LAB — BASIC METABOLIC PANEL
BUN: 54 mg/dL — ABNORMAL HIGH (ref 6–23)
CALCIUM: 7.8 mg/dL — AB (ref 8.4–10.5)
CO2: 33 mEq/L — ABNORMAL HIGH (ref 19–32)
Chloride: 94 mEq/L — ABNORMAL LOW (ref 96–112)
Creatinine, Ser: 0.83 mg/dL (ref 0.50–1.10)
GFR, EST AFRICAN AMERICAN: 76 mL/min — AB (ref >90)
GFR, EST NON AFRICAN AMERICAN: 65 mL/min — AB (ref >90)
Glucose, Bld: 97 mg/dL (ref 70–99)
POTASSIUM: 3.4 meq/L — AB (ref 3.7–5.3)
Sodium: 139 mEq/L (ref 137–147)

## 2013-06-01 LAB — CBC
HCT: 29.5 % — ABNORMAL LOW (ref 36.0–46.0)
HEMOGLOBIN: 9.3 g/dL — AB (ref 12.0–15.0)
MCH: 29.8 pg (ref 26.0–34.0)
MCHC: 31.5 g/dL (ref 30.0–36.0)
MCV: 94.6 fL (ref 78.0–100.0)
RBC: 3.12 MIL/uL — ABNORMAL LOW (ref 3.87–5.11)
RDW: 16.7 % — ABNORMAL HIGH (ref 11.5–15.5)
WBC: 18.2 10*3/uL — ABNORMAL HIGH (ref 4.0–10.5)

## 2013-06-01 LAB — TOBRAMYCIN LEVEL, PEAK: Tobramycin Pk: 3.6 ug/mL — ABNORMAL LOW (ref 5.0–10.0)

## 2013-06-01 NOTE — Progress Notes (Signed)
PULMONARY / CRITICAL CARE MEDICINE  Name: [name] MRN: [id number] DOB: 1933-02-04    CONSULTATION DATE:  05/05/2013  CHIEF COMPLAINT:  Vent management   BRIEF PATIENT DESCRIPTION: 80 yo with left subclavian and axillary vein thrombosis documented 2/24 on anticoagulation transferred from [facility] on 3/2 in hemorrhagic shock and acute blood loss anemia. Her coagulopathy was reversed, she was then stabilized and moved back to [facility] on 3/5 to resume weaning efforts. .   INTERVAL HISTORY:  Unable to tolerate PT off vent ( hypertension, tachycardia, tachypnea, respiratory distress )  PHYSICAL EXAMINATION:  Vital signs:  Reviewed General: Appears chronically ill, no respiratory distress .  Weak Neuro: lethargic but opens eyes HEENT: Tracheostomy site without bleeding / inflammation /ogt Cardiovascular: RRR, no murmurs  Lungs: ronchi Abdomen: Soft, non tender, bowel sounds present  Musculoskeletal: Anasarca, ecchymoses upper extremities bilaterally, L > R BUE edema  LABS:  Recent Labs Lab 05/30/13 1010 05/31/13 0610 06/01/13 0620  NA 142 139 139  K 5.2 4.0 3.4*  CL 98 93* 94*  CO2 35* 32 33*  BUN 65* 63* 54*  CREATININE 0.85 0.84 0.83  GLUCOSE 142* 146* 97    Recent Labs Lab 05/26/13 0600 05/31/13 0610 06/01/13 0620  HGB 9.8* 10.0* 9.3*  HCT 32.7* 31.8* 29.5*  WBC 17.4* 24.8* 18.2*  PLT 203 PLATELET CLUMPS NOTED ON SMEAR, COUNT APPEARS ADEQUATE PLATELET CLUMPING, SUGGEST RECOLLECTION OF SAMPLE IN CITRATE TUBE.   IMAGING: Dg Chest Port 1 View  06/01/2013   CLINICAL DATA:  Respiratory failure.  EXAM: PORTABLE CHEST - 1 VIEW  COMPARISON:  DG CHEST 1V PORT dated 05/31/2013  FINDINGS: Tracheostomy tube, right PICC line, cardiac pacer in stable position. Mediastinum and hilar structures are normal. Heart size normal. Stable bilateral pulmonary alveolar infiltrates are present. No pleural effusion or pneumothorax.  IMPRESSION: 1. Stable line and tube positions. 2. Persistent  unchanged bilateral pulmonary alveolar infiltrates.   Electronically Signed   By: Maisie Fus  Register   On: 06/01/2013 07:22   Dg Chest Port 1 View  05/31/2013   CLINICAL DATA:  Respiratory failure  EXAM: PORTABLE CHEST - 1 VIEW  COMPARISON:  DG CHEST 1V PORT dated 05/22/2013  FINDINGS: AP and tracheostomy tube are stable. Pacemaker device and leads are stable and intact. Bilateral airspace disease has improved. Borderline cardiomegaly. No pneumothorax.  IMPRESSION: Improved bilateral airspace disease.   Electronically Signed   By: Maryclare Bean M.D.   On: 05/31/2013 07:54   ASSESSMENT: Acute on chronic respiratory failure Post ARDS pulmonary fibrosis Pseudomonas HCAP  Tracheostomy status DVT s/p IVC filter Retroperitoneal hemorrhage, resolved Severe deconditioning  PLAN: Full vent support Weaning per protocol May need ventilator facility as poor weaning progress and severe deconditioning Not a candidate for anticoagulation  BABCOCK,PETE,   06/01/2013, 10:53 AM  I have personally obtained history, examined patient, evaluated and interpreted laboratory and imaging results, reviewed medical records, formulated assessment / plan and placed orders.  Lonia Farber, MD Pulmonary and Critical Care Medicine [facility] Pager: [phone]  06/01/2013, 1:25 PM

## 2013-06-01 NOTE — Progress Notes (Signed)
[facility]                                                                                              Progress note     Patient Demographics  [name], is a 80 y.o. female  [id number]  [id number]  DOB - 09/30/1932  Admit date - 05/05/2013  Admitting Physician Carron CurieAli Merve Hotard, MD  Outpatient Primary MD for the patient is Astrid DivineGRIFFIN,Angela COLLINS, MD  LOS - 27   CC : Respiratory failure          Norovirus         Retroperitoneal hematoma         DVT                                                                                                                                                                                                         Subjective:   [name] obtunded and cannot give any history  Objective:   Vital signs  Temperature 96 4 Heart rate 70 Respiratory rate 22 Blood pressure 115/67 Pulse ox 100%    Exam Obtunded  [place].AT,PERRAL NG tube noted in place Supple Neck,No JVD, No cervical lymphadenopathy appriciated. Tracheostomy noted  Symmetrical Chest wall movement, decreased breath sounds bilaterally basally RRR,No Gallops,Rubs or new Murmurs, No Parasternal Heave +ve B.Sounds, Abd Soft, mild generalized tenderness, No organomegaly appriciated, No rebound - guarding or rigidity. PEG tube noted No Cyanosis, Clubbing or edema, No new Rash or bruise     I&Os 3037/3200 Foley yes Trach 6 SH placed on 2/28 PEG tube yes  Data Review   CBC  Recent Labs Lab 05/26/13 0600 05/31/13 0610 06/01/13 0620  WBC 17.4* 24.8* 18.2*  HGB 9.8* 10.0* 9.3*  HCT 32.7* 31.8* 29.5*  PLT 203 PLATELET CLUMPS NOTED ON SMEAR, COUNT APPEARS ADEQUATE PLATELET CLUMPING, SUGGEST RECOLLECTION OF SAMPLE IN CITRATE TUBE.  MCV 98.8 96.1 94.6  MCH 29.6 30.2 29.8  MCHC 30.0 31.4 31.5  RDW 17.8* 16.7* 16.7*    Chemistries   Recent Labs Lab 05/28/13 0615  05/29/13 0620 05/30/13 1010 05/31/13 0610 06/01/13 0620  NA 147 145 142 139 139  K 3.9  3.6* 5.2 4.0 3.4*  CL 98 97 98 93* 94*  CO2 38* 38* 35* 32 33*  GLUCOSE 187* 215* 142* 146* 97  BUN 71* 67* 65* 63* 54*  CREATININE 0.90 0.92 0.85 0.84 0.83  CALCIUM 8.1* 8.1* 8.2* 8.3* 7.8*   Coagulation profile No results found for this basename: INR, PROTIME,  in the last 168 hours  Portable chest x-ray on 4/1 shows bilateral infiltrates C. difficile negative    Assessment & Plan  Ventilator dependent respiratory failure/ARDS/congestive heart failure . Continue AtC trials x 12 hours today Pseudomonas tracheal bronchitis on ceftazidime and tobramycin IV Hemorrhagic shock; reversed by fresh frozen plasma vitamin K and blood transfusion resolved Left pneumothorax status post chest tube placement and removal result, SIRS with yeast in the urine continue with IV meropenem/Diflucan, hypothermia Hypernatremia; resolved Hypertension; controlled A. fib controlled rate  Protein calorie malnutrition; continue with  Glucerna 1.5   Generalized weakness; PT/ OT  Delerium Retroperitoneal bleed History of norovirus infection, resolved Urinary tract infection; start Diflucan for yeast Diabetes mellitus; fair control Hypokalemia repleted Leukocytosis improving , Flagyl started IV  Plan  continue same medications Discussed with PCCM  Code Status:  full  Consults  PCCM. and ID DVT Prophylaxis  SCDs   Carron Curie M.D on 06/01/2013 at 12:52 PM

## 2013-06-02 LAB — TOBRAMYCIN LEVEL, TROUGH: TOBRAMYCIN TR: 1.5 ug/mL (ref 0.5–2.0)

## 2013-06-02 LAB — TOBRAMYCIN LEVEL, PEAK: Tobramycin Pk: 4.8 ug/mL — ABNORMAL LOW (ref 5.0–10.0)

## 2013-06-02 NOTE — Progress Notes (Signed)
[facility]                                                                                              Progress note     Patient Demographics  [name], is a 80 y.o. female  NUU:[id number]  HKV:[id number]  DOB - 06/01/1932  Admit date - 05/05/2013  Admitting Physician Carron Curie, MD  Outpatient Primary MD for the patient is [name], MD  LOS - 28   CC : Respiratory failure          Norovirus         Retroperitoneal hematoma         DVT                                                                                                                                                                                                         Subjective:   [name] confused but awake  Objective:   Vital signs  Temperature 97.5 Heart rate 70 Respiratory rate 23 Blood pressure 132/62 Pulse ox 100%    Exam Alert awake, confused  [place].AT,PERRAL NG tube noted in place Supple Neck,No JVD, No cervical lymphadenopathy appriciated. Tracheostomy noted  Symmetrical Chest wall movement, decreased breath sounds bilaterally basally RRR,No Gallops,Rubs or new Murmurs, No Parasternal Heave +ve B.Sounds, Abd Soft, mild generalized tenderness, No organomegaly appriciated, No rebound - guarding or rigidity. PEG tube noted No Cyanosis, Clubbing or edema, No new Rash or bruise     I&Os 1275/1754 Foley yes Trach 6 SH placed on 2/28 PEG tube yes  Data Review   CBC  Recent Labs Lab 05/31/13 0610 06/01/13 0620  WBC 24.8* 18.2*  HGB 10.0* 9.3*  HCT 31.8* 29.5*  PLT PLATELET CLUMPS NOTED ON SMEAR, COUNT APPEARS ADEQUATE PLATELET CLUMPING, SUGGEST RECOLLECTION OF SAMPLE IN CITRATE TUBE.  MCV 96.1 94.6  MCH 30.2 29.8  MCHC 31.4 31.5  RDW 16.7* 16.7*    Chemistries   Recent Labs Lab 05/28/13 0615 05/29/13 0620 05/30/13 1010 05/31/13 0610 06/01/13 0620  NA 147 145  142 139 139  K 3.9 3.6* 5.2 4.0 3.4*  CL 98 97 98 93* 94*  CO2 38* 38* 35* 32 33*  GLUCOSE 187* 215* 142* 146* 97  BUN 71* 67* 65* 63* 54*  CREATININE 0.90 0.92 0.85 0.84 0.83  CALCIUM 8.1* 8.1* 8.2* 8.3* 7.8*   Coagulation profile No results found for this basename: INR, PROTIME,  in the last 168 hours  Portable chest x-ray on 4/1 shows bilateral infiltrates C. difficile negative    Assessment & Plan  Ventilator dependent respiratory failure/ARDS/congestive heart failure . Continue AtC trials x 12 hours during the day      and vent at night Pseudomonas tracheal bronchitis on ceftazidime and tobramycin IV Hemorrhagic shock; reversed by fresh frozen plasma vitamin K and blood transfusion resolved Left pneumothorax status post chest tube placement and removal resolved. Yeast UTI, treated   hypothermia Hypernatremia; resolved Hypertension; controlled A. fib controlled rate  Protein calorie malnutrition; continue with  Glucerna 1.5   Generalized weakness; PT/ OT  Delerium improving Retroperitoneal bleed History of norovirus infection, resolved Diabetes mellitus; fair control Hypokalemia repleted Leukocytosis improving , Flagyl started IV  Plan  Check CBC BMP in a.m. Hold weaning at 12 hours ATC for now in a.m. and vent at night Code Status:  full  Consults  PCCM. and ID DVT Prophylaxis  SCDs   Carron CurieHijazi, Bryttany Tortorelli M.D on 06/02/2013 at 1:14 PM

## 2013-06-03 LAB — CBC
HCT: 29 % — ABNORMAL LOW (ref 36.0–46.0)
HEMOGLOBIN: 9.3 g/dL — AB (ref 12.0–15.0)
MCH: 30.4 pg (ref 26.0–34.0)
MCHC: 32.1 g/dL (ref 30.0–36.0)
MCV: 94.8 fL (ref 78.0–100.0)
Platelets: ADEQUATE 10*3/uL (ref 150–400)
RBC: 3.06 MIL/uL — AB (ref 3.87–5.11)
RDW: 16.6 % — ABNORMAL HIGH (ref 11.5–15.5)
WBC: 15.6 10*3/uL — ABNORMAL HIGH (ref 4.0–10.5)

## 2013-06-03 LAB — BASIC METABOLIC PANEL
BUN: 44 mg/dL — ABNORMAL HIGH (ref 6–23)
CALCIUM: 8.2 mg/dL — AB (ref 8.4–10.5)
CO2: 30 meq/L (ref 19–32)
Chloride: 96 mEq/L (ref 96–112)
Creatinine, Ser: 0.73 mg/dL (ref 0.50–1.10)
GFR calc Af Amer: >90 mL/min (ref >90)
GFR, EST NON AFRICAN AMERICAN: 79 mL/min — AB (ref >90)
GLUCOSE: 153 mg/dL — AB (ref 70–99)
POTASSIUM: 3.3 meq/L — AB (ref 3.7–5.3)
Sodium: 138 mEq/L (ref 137–147)

## 2013-06-03 NOTE — Progress Notes (Addendum)
[facility]                                                                                              Progress note     Patient Demographics  [name], is a 80 y.o. female  OFB:[id number]  [id number]:[id number]  DOB - Jul 10, 1932  Admit date - 05/05/2013  Admitting Physician Carron Curie, MD  Outpatient Primary MD for the patient is [name], MD  LOS - 29   CC : Respiratory failure          Norovirus         Retroperitoneal hematoma         DVT                                                                                                                                                                                                         Subjective:   [name] confused but awake  Objective:   Vital signs  Temperature 97.9 Heart rate 70 Respiratory rate 24 Blood pressure 118/65 Pulse ox 100%    Exam Alert awake, confused  [place].AT,PERRAL NG tube noted in place Supple Neck,No JVD, No cervical lymphadenopathy appriciated. Tracheostomy noted  Symmetrical Chest wall movement, decreased breath sounds bilaterally basally RRR,No Gallops,Rubs or new Murmurs, No Parasternal Heave +ve B.Sounds, Abd Soft, mild generalized tenderness, No organomegaly appriciated, No rebound - guarding or rigidity. PEG tube noted No Cyanosis, Clubbing or edema, No new Rash or bruise     I&Os 1275/1754 Foley yes Trach 6 SH placed on 2/28 PEG tube yes  Data Review   CBC  Recent Labs Lab 05/31/13 0610 06/01/13 0620 06/03/13 0630  WBC 24.8* 18.2* 15.6*  HGB 10.0* 9.3* 9.3*  HCT 31.8* 29.5* 29.0*  PLT PLATELET CLUMPS NOTED ON SMEAR, COUNT APPEARS ADEQUATE PLATELET CLUMPING, SUGGEST RECOLLECTION OF SAMPLE IN CITRATE TUBE. PLATELET CLUMPS NOTED ON SMEAR, COUNT APPEARS ADEQUATE  MCV 96.1 94.6 94.8  MCH 30.2 29.8 30.4  MCHC 31.4 31.5 32.1  RDW 16.7* 16.7* 16.6*    Chemistries    Recent Labs Lab 05/29/13 0620 05/30/13 1010 05/31/13 0610 06/01/13 0620 06/03/13 0630  NA 145 142 139  139 138  K 3.6* 5.2 4.0 3.4* 3.3*  CL 97 98 93* 94* 96  CO2 38* 35* 32 33* 30  GLUCOSE 215* 142* 146* 97 153*  BUN 67* 65* 63* 54* 44*  CREATININE 0.92 0.85 0.84 0.83 0.73  CALCIUM 8.1* 8.2* 8.3* 7.8* 8.2*   Coagulation profile No results found for this basename: INR, PROTIME,  in the last 168 hours  Portable chest x-ray on 4/1 shows bilateral infiltrates C. difficile negative    Assessment & Plan  Ventilator dependent respiratory failure/ARDS/congestive heart failure . Continue AtC 30% x 12 hours during the day          and vent at night. Will not increase the ATC time since the patient is severely deconditioned and not tolerating it at     this time Pseudomonas tracheal bronchitis on ceftazidime and tobramycin IV until Monday Hemorrhagic shock; reversed by fresh frozen plasma vitamin K and blood transfusion resolved Left pneumothorax status post chest tube placement and removal resolved. Yeast UTI, treated   hypothermia Hypernatremia; resolved Hypertension; controlled A. fib controlled rate  Protein calorie malnutrition; continue with  Glucerna 1.5   Generalized weakness; PT/ OT  Delerium improving Retroperitoneal bleed History of norovirus infection, resolved Diabetes mellitus; fair control Hypokalemia repleted Leukocytosis improving , Flagyl started IV  Plan  Continue IV antibiotics until Monday then DC Continue with Flagyl and switched to by mouth on discharge Code Status:  full  Consults  PCCM. and ID DVT Prophylaxis  SCDs   Carron CurieHijazi, Shanee Batch M.D on 06/03/2013 at 12:16 PM

## 2013-06-04 LAB — POTASSIUM: POTASSIUM: 3.7 meq/L (ref 3.7–5.3)

## 2013-06-04 NOTE — Progress Notes (Signed)
[facility]                                                                                              Progress note     Patient Demographics  Arnette NorrisShirley Guilfoil, is a 80 y.o. female  RUE:[id number]  JYN:[id number]  DOB - 01/13/1933  Admit date - 05/05/2013  Admitting Physician Carron CurieAli Judieth Mckown, MD  Outpatient Primary MD for the patient is Astrid DivineGRIFFIN,ELAINE COLLINS, MD  LOS - 30   CC : Respiratory failure          Norovirus         Retroperitoneal hematoma         DVT                                                                                                                                                                                                         Subjective:   Arnette NorrisShirley Krus confused but awake  Objective:   Vital signs  Temperature 96.9 Heart rate 70 Respiratory rate 20 Blood pressure 106/64 Pulse ox 100%    Exam Alert awake, confused  [place].AT,PERRAL NG tube noted in place Supple Neck,No JVD, No cervical lymphadenopathy appriciated. Tracheostomy noted  Symmetrical Chest wall movement, decreased breath sounds bilaterally basally RRR,No Gallops,Rubs or new Murmurs, No Parasternal Heave +ve B.Sounds, Abd Soft, mild generalized tenderness, No organomegaly appriciated, No rebound - guarding or rigidity. PEG tube noted No Cyanosis, Clubbing or edema, No new Rash or bruise     I&Os 1940/2610 Foley yes Trach 6 SH placed on 2/28 PEG tube yes  Data Review   CBC  Recent Labs Lab 05/31/13 0610 06/01/13 0620 06/03/13 0630  WBC 24.8* 18.2* 15.6*  HGB 10.0* 9.3* 9.3*  HCT 31.8* 29.5* 29.0*  PLT PLATELET CLUMPS NOTED ON SMEAR, COUNT APPEARS ADEQUATE PLATELET CLUMPING, SUGGEST RECOLLECTION OF SAMPLE IN CITRATE TUBE. PLATELET CLUMPS NOTED ON SMEAR, COUNT APPEARS ADEQUATE  MCV 96.1 94.6 94.8  MCH 30.2 29.8 30.4  MCHC 31.4 31.5 32.1  RDW 16.7* 16.7* 16.6*    Chemistries    Recent Labs Lab 05/29/13 0620 05/30/13 1010 05/31/13 0610 06/01/13 0620 06/03/13 0630 06/04/13 0943  NA 145  142 139 139 138  --   K 3.6* 5.2 4.0 3.4* 3.3* 3.7  CL 97 98 93* 94* 96  --   CO2 38* 35* 32 33* 30  --   GLUCOSE 215* 142* 146* 97 153*  --   BUN 67* 65* 63* 54* 44*  --   CREATININE 0.92 0.85 0.84 0.83 0.73  --   CALCIUM 8.1* 8.2* 8.3* 7.8* 8.2*  --    Coagulation profile No results found for this basename: INR, PROTIME,  in the last 168 hours      Assessment & Plan  Ventilator dependent respiratory failure/ARDS/congestive heart failure . Continue AtC 28% x 12 hours during the day          and vent at night. Will not increase the ATC time since the patient is severely deconditioned and not tolerating it at     this time Pseudomonas tracheal bronchitis on ceftazidime and tobramycin IV until Monday Hemorrhagic shock; reversed by fresh frozen plasma vitamin K and blood transfusion resolved Left pneumothorax status post chest tube placement and removal resolved. Yeast UTI, treated   hypothermia Hypernatremia; resolved Hypertension; controlled A. fib controlled rate  Protein calorie malnutrition; continue with  Glucerna 1.5   Generalized weakness; PT/ OT  Delerium improving Retroperitoneal bleed History of norovirus infection, resolved Diabetes mellitus; fair control Hypokalemia repleted Leukocytosis improving , Flagyl started IV  Plan  Continue IV antibiotics until Monday then DC Continue with Flagyl and switched to by mouth on discharge Check BMP in a.m. Code Status:  full  Consults  PCCM. and ID DVT Prophylaxis  SCDs   Carron Curie M.D on 06/04/2013 at 1:08 PM

## 2013-06-05 LAB — BASIC METABOLIC PANEL
BUN: 35 mg/dL — ABNORMAL HIGH (ref 6–23)
CALCIUM: 8 mg/dL — AB (ref 8.4–10.5)
CO2: 30 meq/L (ref 19–32)
CREATININE: 0.75 mg/dL (ref 0.50–1.10)
Chloride: 95 mEq/L — ABNORMAL LOW (ref 96–112)
GFR calc non Af Amer: 78 mL/min — ABNORMAL LOW (ref >90)
Glucose, Bld: 132 mg/dL — ABNORMAL HIGH (ref 70–99)
Potassium: 3.5 mEq/L — ABNORMAL LOW (ref 3.7–5.3)
Sodium: 137 mEq/L (ref 137–147)

## 2013-06-05 NOTE — Progress Notes (Signed)
[facility]                                                                                              Progress note     Patient Demographics  [name], is a 80 y.o. female  XKG:[id number]  FWY:[id number]  DOB - 24-Oct-1932  Admit date - 05/05/2013  Admitting Physician Carron Curie, MD  Outpatient Primary MD for the patient is [name], MD  LOS - 31   CC : Respiratory failure          Norovirus         Retroperitoneal hematoma         DVT                                                                                                                                                                                                         Subjective:   [name] confused but awake  Objective:   Vital signs  Temperature 96.5 Heart rate 70 Respiratory rate 20 Blood pressure 110/56 Pulse ox 100%    Exam Alert awake, confused  [place].AT,PERRAL NG tube noted in place Supple Neck,No JVD, No cervical lymphadenopathy appriciated. Tracheostomy noted  Symmetrical Chest wall movement, decreased breath sounds bilaterally basally RRR,No Gallops,Rubs or new Murmurs, No Parasternal Heave +ve B.Sounds, Abd Soft, mild generalized tenderness, No organomegaly appriciated, No rebound - guarding or rigidity. PEG tube noted No Cyanosis, Clubbing or edema, No new Rash or bruise     I&Os 2515/2250 Foley yes Trach 6 SH placed on 2/28 PEG tube yes  Data Review   CBC  Recent Labs Lab 05/31/13 0610 06/01/13 0620 06/03/13 0630  WBC 24.8* 18.2* 15.6*  HGB 10.0* 9.3* 9.3*  HCT 31.8* 29.5* 29.0*  PLT PLATELET CLUMPS NOTED ON SMEAR, COUNT APPEARS ADEQUATE PLATELET CLUMPING, SUGGEST RECOLLECTION OF SAMPLE IN CITRATE TUBE. PLATELET CLUMPS NOTED ON SMEAR, COUNT APPEARS ADEQUATE  MCV 96.1 94.6 94.8  MCH 30.2 29.8 30.4  MCHC 31.4 31.5 32.1  RDW 16.7* 16.7* 16.6*    Chemistries    Recent Labs Lab 05/30/13 1010 05/31/13 0610 06/01/13 0620 06/03/13 0630 06/04/13 0943 06/05/13 0700  NA 142  139 139 138  --  137  K 5.2 4.0 3.4* 3.3* 3.7 3.5*  CL 98 93* 94* 96  --  95*  CO2 35* 32 33* 30  --  30  GLUCOSE 142* 146* 97 153*  --  132*  BUN 65* 63* 54* 44*  --  35*  CREATININE 0.85 0.84 0.83 0.73  --  0.75  CALCIUM 8.2* 8.3* 7.8* 8.2*  --  8.0*   Coagulation profile No results found for this basename: INR, PROTIME,  in the last 168 hours      Assessment & Plan  Ventilator dependent respiratory failure/ARDS/congestive heart failure . Continue AtC 28% x 12 hours during the day          and vent at night. Will not increase the ATC time since the patient is severely deconditioned and not tolerating it at     this time Pseudomonas tracheal bronchitis on ceftazidime and tobramycin IV until Monday Hemorrhagic shock; reversed by fresh frozen plasma vitamin K and blood transfusion resolved Left pneumothorax status post chest tube placement and removal resolved. Yeast UTI, treated   hypothermia Hypernatremia; resolved Hypertension; controlled A. fib controlled rate  Protein calorie malnutrition; continue with  Glucerna 1.5   Generalized weakness; PT/ OT  Delerium improving Retroperitoneal bleed History of norovirus infection, resolved Diabetes mellitus; fair control Hypokalemia repleted Leukocytosis improving , Flagyl started IV  Plan  DC IV antibiotics in a.m.  Chest x-ray and CBC in a.m.  ATC 24 hours today ABGs in a.m. Potassium 40 mEq today Code Status:  full  Consults  PCCM. and ID DVT Prophylaxis  SCDs   Carron CurieHijazi, Joan Avetisyan M.D on 06/05/2013 at 11:32 AM

## 2013-06-06 LAB — POTASSIUM: POTASSIUM: 3.5 meq/L — AB (ref 3.7–5.3)

## 2013-06-06 LAB — CBC
HCT: 32.5 % — ABNORMAL LOW (ref 36.0–46.0)
Hemoglobin: 10.3 g/dL — ABNORMAL LOW (ref 12.0–15.0)
MCH: 29.6 pg (ref 26.0–34.0)
MCHC: 31.7 g/dL (ref 30.0–36.0)
MCV: 93.4 fL (ref 78.0–100.0)
PLATELETS: ADEQUATE 10*3/uL (ref 150–400)
RBC: 3.48 MIL/uL — ABNORMAL LOW (ref 3.87–5.11)
RDW: 16.6 % — AB (ref 11.5–15.5)
WBC: 14.3 10*3/uL — AB (ref 4.0–10.5)

## 2013-06-06 NOTE — Progress Notes (Signed)
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 Her coagulopathy was reversed, she was then stabilized and moved back to [facility] on 3/5 to resume weaning efforts. .   INTERVAL HISTORY:  No acute events.     PHYSICAL EXAMINATION:  Vital signs:  Reviewed  General: Appears chronically ill, no respiratory distress .  Weak Neuro: lethargic but opens eyes HEENT: Tracheostomy site without bleeding / inflammation /ogt Cardiovascular: RRR, no murmurs  Lungs: coarse bilateral Abdomen: Soft, non tender, bowel sounds present  Musculoskeletal: edema, ecchymoses upper extremities bilaterally, L > R BUE edema  LABS:  Recent Labs Lab 06/01/13 0620 06/03/13 0630 06/04/13 0943 06/05/13 0700  NA 139 138  --  137  K 3.4* 3.3* 3.7 3.5*  CL 94* 96  --  95*  CO2 33* 30  --  30  BUN 54* 44*  --  35*  CREATININE 0.83 0.73  --  0.75  GLUCOSE 97 153*  --  132*    Recent Labs Lab 05/31/13 0610 06/01/13 0620 06/03/13 0630  HGB 10.0* 9.3* 9.3*  HCT 31.8* 29.5* 29.0*  WBC 24.8* 18.2* 15.6*  PLT PLATELET CLUMPS NOTED ON SMEAR, COUNT APPEARS ADEQUATE PLATELET CLUMPING, SUGGEST RECOLLECTION OF SAMPLE IN CITRATE TUBE. PLATELET CLUMPS NOTED ON SMEAR, COUNT APPEARS ADEQUATE   IMAGING: No results found.  ASSESSMENT: Acute on chronic respiratory failure Post ARDS pulmonary fibrosis Pseudomonas HCAP  Tracheostomy status DVT s/p IVC filter Retroperitoneal hemorrhage, resolved Severe deconditioning  PLAN: Vent support PRN Increase PMV usage, important as now on TC Goal 24 hours on ATC 4/6 severe deconditioning - PT Not a candidate for anticoagulation as of now  I have personally obtained  history, examined patient, evaluated and interpreted laboratory and imaging results, reviewed medical records, formulated assessment / plan and placed orders.  06/06/2013, 11:25 AM  Mcarthur Rossetti. Tyson Alias, MD, FACP Pgr: [phone] [place]

## 2013-06-07 LAB — BASIC METABOLIC PANEL
BUN: 39 mg/dL — ABNORMAL HIGH (ref 6–23)
CHLORIDE: 96 meq/L (ref 96–112)
CO2: 29 mEq/L (ref 19–32)
Calcium: 8.6 mg/dL (ref 8.4–10.5)
Creatinine, Ser: 0.73 mg/dL (ref 0.50–1.10)
GFR, EST NON AFRICAN AMERICAN: 79 mL/min — AB (ref >90)
Glucose, Bld: 137 mg/dL — ABNORMAL HIGH (ref 70–99)
POTASSIUM: 4.1 meq/L (ref 3.7–5.3)
SODIUM: 139 meq/L (ref 137–147)

## 2013-06-08 ENCOUNTER — Other Ambulatory Visit (HOSPITAL_COMMUNITY): Payer: Self-pay

## 2013-06-10 NOTE — Progress Notes (Signed)
PULMONARY / CRITICAL CARE MEDICINE  Name: [name] MRN: [id number] DOB: Apr 23, 1932    CONSULTATION DATE:  05/05/2013  CHIEF COMPLAINT:  Vent management   BRIEF PATIENT DESCRIPTION: 80 y/o with left subclavian and axillary vein thrombosis documented 2/24 on anticoagulation transferred from [facility] on 3/2 in hemorrhagic shock and acute blood loss anemia. Her coagulopathy was reversed, she was then stabilized and moved back to [facility] on 3/5 to resume weaning efforts. .   INTERVAL HISTORY:  6 days on ATC, no distress with 28%    PHYSICAL EXAMINATION:  Vital signs:  Reviewed  General: Appears chronically ill, no respiratory distress .   Neuro: awake, alert, generalized weakness HEENT: Trach midline, c/d/i, tol PMV Cardiovascular: RRR, no murmurs  Lungs: resp's even/non-labored, lungs bilaterally diminished but clear Abdomen: Soft, non tender, bowel sounds present  Musculoskeletal: edema, ecchymoses upper extremities bilaterally, L > R BUE edema  LABS:  Recent Labs Lab 06/05/13 0700 06/06/13 1125 06/07/13 0914  NA 137  --  139  K 3.5* 3.5* 4.1  CL 95*  --  96  CO2 30  --  29  BUN 35*  --  39*  CREATININE 0.75  --  0.73  GLUCOSE 132*  --  137*    Recent Labs Lab 06/06/13 1125  HGB 10.3*  HCT 32.5*  WBC 14.3*  PLT PLATELET CLUMPS NOTED ON SMEAR, COUNT APPEARS ADEQUATE   IMAGING: No results found.  ASSESSMENT: Acute on chronic respiratory failure Post ARDS pulmonary fibrosis Pseudomonas HCAP  Tracheostomy status DVT s/p IVC filter Retroperitoneal hemorrhage, resolved Severe deconditioning  PLAN: Vent support PRN Increase PMV usage, important as now on TC.  No PMV while sleeping Severe deconditioning, needs aggressive PT Not a candidate for anticoagulation as of now   PCCM will see Q Monday.  Please call sooner if needs arise.   I have personally obtained history, examined patient, evaluated and interpreted laboratory and imaging results, reviewed medical  records, formulated assessment / plan and placed orders.  06/10/2013, 1:15 PM  [name], MD, PhD 06/11/2013, 2:48 PM [facility] [phone] or if no answer [phone]

## 2013-06-11 LAB — CBC
HEMATOCRIT: 34.5 % — AB (ref 36.0–46.0)
Hemoglobin: 10.9 g/dL — ABNORMAL LOW (ref 12.0–15.0)
MCH: 30 pg (ref 26.0–34.0)
MCHC: 31.6 g/dL (ref 30.0–36.0)
MCV: 95 fL (ref 78.0–100.0)
Platelets: ADEQUATE 10*3/uL (ref 150–400)
RBC: 3.63 MIL/uL — AB (ref 3.87–5.11)
RDW: 17.3 % — ABNORMAL HIGH (ref 11.5–15.5)
WBC: 15.6 10*3/uL — AB (ref 4.0–10.5)

## 2013-06-11 LAB — BASIC METABOLIC PANEL
BUN: 36 mg/dL — ABNORMAL HIGH (ref 6–23)
CHLORIDE: 93 meq/L — AB (ref 96–112)
CO2: 33 meq/L — AB (ref 19–32)
Calcium: 8.8 mg/dL (ref 8.4–10.5)
Creatinine, Ser: 0.83 mg/dL (ref 0.50–1.10)
GFR calc Af Amer: 76 mL/min — ABNORMAL LOW (ref >90)
GFR calc non Af Amer: 65 mL/min — ABNORMAL LOW (ref >90)
Glucose, Bld: 123 mg/dL — ABNORMAL HIGH (ref 70–99)
POTASSIUM: 3 meq/L — AB (ref 3.7–5.3)
SODIUM: 139 meq/L (ref 137–147)

## 2013-06-12 ENCOUNTER — Other Ambulatory Visit (HOSPITAL_COMMUNITY): Payer: Medicare Other

## 2013-06-12 LAB — BASIC METABOLIC PANEL
BUN: 36 mg/dL — AB (ref 6–23)
CHLORIDE: 93 meq/L — AB (ref 96–112)
CO2: 29 meq/L (ref 19–32)
Calcium: 8.9 mg/dL (ref 8.4–10.5)
Creatinine, Ser: 0.78 mg/dL (ref 0.50–1.10)
GFR calc Af Amer: 90 mL/min — ABNORMAL LOW (ref >90)
GFR calc non Af Amer: 77 mL/min — ABNORMAL LOW (ref >90)
Glucose, Bld: 104 mg/dL — ABNORMAL HIGH (ref 70–99)
Potassium: 4.3 mEq/L (ref 3.7–5.3)
Sodium: 137 mEq/L (ref 137–147)

## 2013-06-13 ENCOUNTER — Encounter: Payer: Self-pay | Admitting: Internal Medicine

## 2013-06-13 NOTE — Progress Notes (Signed)
[facility]                                                                                              Progress note     Patient Demographics  [name], is a 80 y.o. female  ZOX:[id number]  WJX:[id number]  DOB - 05/29/1932  Admit date - 05/05/2013  Admitting Physician Carron CurieAli Malaiyah Achorn, MD  Outpatient Primary MD for the patient is Astrid DivineGRIFFIN,ELAINE COLLINS, MD  LOS - 39   CC : Respiratory failure          Norovirus         Retroperitoneal hematoma         DVT                                                                                                                                                                                                         Subjective:   Angela NorrisShirley Fitzgerald confused but awake. Denies any chest pains or shortness of breath  Objective:   Vital signs  Temperature 98.9 Heart rate 80 Respiratory rate 18 Blood pressure 142/66 Pulse ox 95%    Exam Alert awake, confused  [place].AT,PERRAL NG tube noted in place Supple Neck,No JVD, No cervical lymphadenopathy appriciated. Tracheostomy noted  Symmetrical Chest wall movement, decreased breath sounds bilaterally basally RRR,No Gallops,Rubs or new Murmurs, No Parasternal Heave +ve B.Sounds, Abd Soft, mild generalized tenderness, No organomegaly appriciated, No rebound - guarding or rigidity. PEG tube noted No Cyanosis, Clubbing or edema, No new Rash or bruise     I&Os 1728/1625 Foley yes Trach 4 Shiley cuff less PEG tube yes  Data Review   CBC  Recent Labs Lab 06/11/13 0730  WBC 15.6*  HGB 10.9*  HCT 34.5*  PLT PLATELET CLUMPS NOTED ON SMEAR, COUNT APPEARS ADEQUATE  MCV 95.0  MCH 30.0  MCHC 31.6  RDW 17.3*    Chemistries   Recent Labs Lab 06/07/13 0914 06/11/13 0730 06/12/13 0634  NA 139 139 137  K 4.1 3.0* 4.3  CL 96 93* 93*  CO2 29 33* 29  GLUCOSE 137* 123* 104*  BUN 39* 36* 36*   CREATININE 0.73 0.83 0.78  CALCIUM  8.6 8.8 8.9   Coagulation profile No results found for this basename: INR, PROTIME,  in the last 168 hours      Assessment & Plan  Ventilator dependent respiratory failure/ARDS/congestive heart failure . Resolved, patient is on 28% ATC Pseudomonas tracheal bronchitis treated Hemorrhagic shock; reversed by fresh frozen plasma vitamin K and blood transfusion resolved Left pneumothorax status post chest tube placement and removal resolved. Yeast UTI, treated   hypothermia Hypernatremia; resolved Hypertension; controlled A. fib controlled rate  Protein calorie malnutrition; continue with  Glucerna 1.5   Generalized weakness; PT/ OT  Delerium improving Retroperitoneal bleed History of norovirus infection, resolved Diabetes mellitus; fair control Hypokalemia repleted Leukocytosis improving ,   Plan   Probable discharge in a.m.   Code Status:  full  Consults  PCCM. and ID DVT Prophylaxis  SCDs   Carron Curie M.D on 06/13/2013 at 6:01 PM

## 2013-06-13 NOTE — Progress Notes (Signed)
Pt seen and pacemaker interrogated Functioning normally Longevity est 6-9 months Will need to arrange follow up after transfer

## 2013-06-15 ENCOUNTER — Non-Acute Institutional Stay (SKILLED_NURSING_FACILITY): Payer: Medicare Other | Admitting: Internal Medicine

## 2013-06-15 ENCOUNTER — Encounter: Payer: Self-pay | Admitting: Internal Medicine

## 2013-06-15 ENCOUNTER — Other Ambulatory Visit: Payer: Self-pay | Admitting: *Deleted

## 2013-06-15 DIAGNOSIS — Z930 Tracheostomy status: Secondary | ICD-10-CM

## 2013-06-15 DIAGNOSIS — Z931 Gastrostomy status: Secondary | ICD-10-CM

## 2013-06-15 DIAGNOSIS — A0811 Acute gastroenteropathy due to Norwalk agent: Secondary | ICD-10-CM

## 2013-06-15 DIAGNOSIS — J9690 Respiratory failure, unspecified, unspecified whether with hypoxia or hypercapnia: Secondary | ICD-10-CM

## 2013-06-15 DIAGNOSIS — J151 Pneumonia due to Pseudomonas: Secondary | ICD-10-CM

## 2013-06-15 DIAGNOSIS — I4891 Unspecified atrial fibrillation: Secondary | ICD-10-CM

## 2013-06-15 DIAGNOSIS — E039 Hypothyroidism, unspecified: Secondary | ICD-10-CM

## 2013-06-15 DIAGNOSIS — J9600 Acute respiratory failure, unspecified whether with hypoxia or hypercapnia: Secondary | ICD-10-CM

## 2013-06-15 DIAGNOSIS — I82629 Acute embolism and thrombosis of deep veins of unspecified upper extremity: Secondary | ICD-10-CM

## 2013-06-15 DIAGNOSIS — Z79899 Other long term (current) drug therapy: Secondary | ICD-10-CM

## 2013-06-15 DIAGNOSIS — Z950 Presence of cardiac pacemaker: Secondary | ICD-10-CM

## 2013-06-15 NOTE — Assessment & Plan Note (Signed)
Placed while intubated and used after weaning because of poor appetite

## 2013-06-15 NOTE — Assessment & Plan Note (Signed)
Pt had diarrhea and despite hydration CODED and was intubated.

## 2013-06-15 NOTE — Assessment & Plan Note (Signed)
Lovenox then coumadin which became supratheraputic and pt found to have retroperitoneal hematoma

## 2013-06-15 NOTE — Assessment & Plan Note (Signed)
NO LONGER BEING USED

## 2013-06-15 NOTE — Progress Notes (Signed)
MRN: [id number] Name: [name]  Sex: female Age: 80 y.o. DOB: 1932/08/07  PSC #: Sonny Dandy Facility/Room: 212 Level Of Care: SNF Provider: Margit Hanks Emergency Contacts: Extended Emergency Contact Information Primary Emergency Contact: Clover Mealy Address: [address]          Jacky Kindle [place] Home Phone: [phone] Mobile Phone: [phone] Relation: Spouse  Code Status: FULL - has been seen by pallative care  Allergies: Beta adrenergic blockers; Bystolic; Carvedilol; Dapsone; Diclofenac sodium; Livalo; Methotrexate; Metoprolol succinate; Oxaprozin; Other; and Epinephrine  Chief Complaint  Patient presents with  . nursing home admission    HPI: Patient is 80 y.o. female who has survived cardiac arrest, respiratory failure, drug resistant pseudomonas PNA, hypotension from retroperitoneal bleed from upper ext DVT with super coagulation, who has trach, PEG, pacer, is FULL code and admitted to SNF for OT/PT.  Past Medical History  Diagnosis Date  . Hypothyroid   . Complete heart block 2002    Initial PPM 2002 with upgrade by JA to CRT-P 11/21/08  . Nonischemic cardiomyopathy   . Persistent atrial fibrillation   . Hypertension   . Hyperlipidemia   . Hives 4/10  . Cataract 4/13    right  . Renal failure     3rd stage-seeing neurologist    Past Surgical History  Procedure Laterality Date  . Pacemaker insertion  2002, 2010    PPM 2002, upgrade to CRT-P by Dr Johney Frame (MDT 2010)  . Partial colectomy  1/04  . Abdominal hysterectomy  1987    TAH  . Bunionectomy Right 1996  . Breast biopsy Left 01/1997  . Cataract extraction  4/13    both eyes  . Cyst excision      on back of left ear      Medication List       This list is accurate as of: 06/15/13  8:28 PM.  Always use your most recent med list.               clonazePAM 0.5 MG tablet  Commonly known as:  KLONOPIN  Take 0.5 mg by mouth every 8 (eight) hours.     diphenoxylate-atropine 2.5-0.025 MG per tablet  Commonly known as:  LOMOTIL  Take by mouth 4 (four) times daily as needed for diarrhea or loose stools.     famotidine 20 MG tablet  Commonly known as:  PEPCID  Take 20 mg by mouth 2 (two) times daily.     feeding supplement (PRO-STAT SUGAR FREE 64) Liqd  Take 30 mLs by mouth 3 (three) times daily with meals.     feeding supplement (VITAL 1.5 CAL) Liqd  Place 1,000 mLs into feeding tube daily.     fentaNYL 50 MCG/HR  Commonly known as:  DURAGESIC - dosed mcg/hr  Place 50 mcg onto the skin every 3 (three) days.     furosemide 40 MG tablet  Commonly known as:  LASIX  Take 40 mg by mouth 2 (two) times daily.     hydrocortisone cream 1 %  Apply 1 application topically daily.     insulin aspart 100 UNIT/ML injection  Commonly known as:  novoLOG  Inject 0-15 Units into the skin every 4 (four) hours.     insulin detemir 100 UNIT/ML injection  Commonly known as:  LEVEMIR  Inject 12 Units into the skin at bedtime.     lactobacillus Pack  Take 1 g by mouth 2 (two) times daily.  levothyroxine 75 MCG tablet  Commonly known as:  SYNTHROID, LEVOTHROID  Place 100 mcg into feeding tube daily before breakfast.     morphine 2 MG/ML injection  Inject 1 mL (2 mg total) into the vein every 3 (three) hours as needed.     potassium chloride SA 20 MEQ tablet  Commonly known as:  K-DUR,KLOR-CON  Take 20 mEq by mouth daily.     sertraline 50 MG tablet  Commonly known as:  ZOLOFT  Take 75 mg by mouth daily.        Meds ordered this encounter  Medications  . clonazePAM (KLONOPIN) 0.5 MG tablet    Sig: Take 0.5 mg by mouth every 8 (eight) hours.  . diphenoxylate-atropine (LOMOTIL) 2.5-0.025 MG per tablet    Sig: Take by mouth 4 (four) times daily as needed for diarrhea or loose stools.  . famotidine (PEPCID) 20 MG tablet    Sig: Take 20 mg by mouth 2 (two) times daily.  . fentaNYL (DURAGESIC - DOSED MCG/HR) 50 MCG/HR    Sig:  Place 50 mcg onto the skin every 3 (three) days.  Marland Kitchen. lactobacillus (FLORANEX/LACTINEX) PACK    Sig: Take 1 g by mouth 2 (two) times daily.  . furosemide (LASIX) 40 MG tablet    Sig: Take 40 mg by mouth 2 (two) times daily.  . hydrocortisone cream 1 %    Sig: Apply 1 application topically daily.  . insulin detemir (LEVEMIR) 100 UNIT/ML injection    Sig: Inject 12 Units into the skin at bedtime.  . potassium chloride SA (K-DUR,KLOR-CON) 20 MEQ tablet    Sig: Take 20 mEq by mouth daily.  . Amino Acids-Protein Hydrolys (FEEDING SUPPLEMENT, PRO-STAT SUGAR FREE 64,) LIQD    Sig: Take 30 mLs by mouth 3 (three) times daily with meals.  . sertraline (ZOLOFT) 50 MG tablet    Sig: Take 75 mg by mouth daily.  Marland Kitchen. levothyroxine (SYNTHROID, LEVOTHROID) 75 MCG tablet    Sig: Place 100 mcg into feeding tube daily before breakfast.     There is no immunization history on file for this patient.  History  Substance Use Topics  . Smoking status: Never Smoker   . Smokeless tobacco: Never Used  . Alcohol Use: No    Family history is noncontributory    Review of Systems  DATA OBTAINED: from patient, husband GENERAL:  no fevers, +fatigue, -appetite  SKIN: No itching, rash  EYES: No eye pain, redness, discharge EARS: No earache, tinnitus, change in hearing NOSE: No congestion, drainage or bleeding  MOUTH/THROAT: No mouth or tooth pain, No sore throat RESPIRATORY: No cough, wheezing,+ SOB CARDIAC: No chest pain, palpitations, lower extremity edema  GI: No abdominal pain, No N/V/D or constipation, No heartburn or reflux  GU: No dysuria, frequency or urgency, or incontinence  MUSCULOSKELETAL: No unrelieved bone/joint pain NEUROLOGIC: No headache, dizziness or focal weakness PSYCHIATRIC:  No behavior issue.   Filed Vitals:   06/15/13 1951  BP: 147/76  Pulse: 80  Temp: 98 F (36.7 C)  Resp: 18    Physical Exam  GENERAL APPEARANCE: weakly alert, min conversant, NAD  SKIN: No diaphoresis  rash; + pallor HEAD: Normocephalic, atraumatic  EYES: Conjunctiva/lids clear. Pupils round, reactive. EOMs intact.  EARS: External exam WNL, canals clear. Hearing grossly normal.  NOSE: No deformity or discharge.  MOUTH/THROAT: Lips w/o lesions RESPIRATORY: Breathing is even, unlabored. Lung sounds are diffusely decreased , trach with O2 CARDIOVASCULAR: Heart RRR no murmurs, rubs or gallops. No  peripheral edema.  GASTROINTESTINAL: Abdomen is soft, non-tender, not distended w/ normal bowel sounds; PEG GENITOURINARY: Bladder non tender, not distended  MUSCULOSKELETAL: No abnormal joints or musculature NEUROLOGIC: . Cranial nerves 2-12 grossly intact. Moves all extremities  PSYCHIATRIC: flat affect, no behavioral issues  Patient Active Problem List   Diagnosis Date Noted  . PEG (percutaneous endoscopic gastrostomy) status 06/15/2013  . Norovirus 06/15/2013  . Pneumonia due to Pseudomonas 06/15/2013  . Cardiac pacemaker in situ 06/15/2013  . DVT of upper extremity (deep vein thrombosis) 05/05/2013  . Hemorrhagic shock 05/02/2013  . Tracheostomy status 04/27/2013  . Acute on chronic respiratory failure 04/25/2013  . Acute delirium-ICU 04/13/2013  . Palliative care encounter 04/13/2013  . Long term current use of amiodarone 04/13/2013  . Respiratory failure 04/05/2013  . CAP (community acquired pneumonia) 04/05/2013  . Sepsis 04/05/2013  . Acute on chronic renal failure 04/05/2013  . Viral pneumonia 04/05/2013  . Hypotension 04/05/2013  . Combined systolic and diastolic heart failure 04/05/2013  . Essential hypertension, benign 03/23/2009  . ATRIOVENTRICULAR BLOCK, COMPLETE 11/09/2008  . Atrial fibrillation 11/09/2008  . Unspecified hypothyroidism 11/08/2008  . OTHER PRIMARY CARDIOMYOPATHIES 11/08/2008  . UNSPECIFIED SYSTOLIC HEART FAILURE 11/08/2008  . SYNCOPE 11/08/2008    CBC    Component Value Date/Time   WBC 15.6* 06/11/2013 0730   WBC 7.9 02/11/2011 1144   RBC 3.63*  06/11/2013 0730   RBC 4.80 02/11/2011 1144   HGB 10.9* 06/11/2013 0730   HGB 12.7 02/11/2011 1144   HCT 34.5* 06/11/2013 0730   HCT 39.4 02/11/2011 1144   PLT PLATELET CLUMPS NOTED ON SMEAR, COUNT APPEARS ADEQUATE 06/11/2013 0730   PLT 308 Platelet count by citrate method 02/11/2011 1144   MCV 95.0 06/11/2013 0730   MCV 82.1 02/11/2011 1144   LYMPHSABS 1.6 05/15/2013 0530   LYMPHSABS 2.0 02/11/2011 1144   MONOABS 0.7 05/15/2013 0530   MONOABS 0.7 02/11/2011 1144   EOSABS 0.1 05/15/2013 0530   EOSABS 0.2 02/11/2011 1144   BASOSABS 0.0 05/15/2013 0530   BASOSABS 0.0 02/11/2011 1144    CMP     Component Value Date/Time   NA 137 06/12/2013 0634   K 4.3 06/12/2013 0634   CL 93* 06/12/2013 0634   CO2 29 06/12/2013 0634   GLUCOSE 104* 06/12/2013 0634   BUN 36* 06/12/2013 0634   CREATININE 0.78 06/12/2013 0634   CALCIUM 8.9 06/12/2013 0634   PROT 4.2* 05/04/2013 1342   ALBUMIN 1.6* 05/04/2013 1342   AST 104* 05/04/2013 1342   ALT 230* 05/04/2013 1342   ALKPHOS 56 05/04/2013 1342   BILITOT 0.9 05/04/2013 1342   GFRNONAA 77* 06/12/2013 0634   GFRAA 90* 06/12/2013 0634    Assessment and Plan  Norovirus Pt had diarrhea and despite hydration CODED and was intubated.  DVT of upper extremity (deep vein thrombosis) Lovenox then coumadin which became supratheraputic and pt found to have retroperitoneal hematoma  Respiratory failure Difficulty weaning so trach placed;CT showed ARDS and rib fx 3 and 4 from CPR most likely  Pneumonia due to Pseudomonas Pt was then found to have multidrug resisitant pseudomonas with intermediate sensitivity to cefepime and tobra and she was treated for 10 days.  PEG (percutaneous endoscopic gastrostomy) status Placed while intubated and used after weaning because of poor appetite  Cardiac pacemaker in situ For atrial fib-was checked in hospital-working  Atrial fibrillation S/p pacemaker  Unspecified hypothyroidism Synthroid 100 mcg daily  Long term current use of  amiodarone NO LONGER BEING USED  Hennie Duos, MD

## 2013-06-15 NOTE — Assessment & Plan Note (Signed)
S/p pacemaker

## 2013-06-15 NOTE — Addendum Note (Signed)
Addended by: Merrilee Seashore D on: 06/15/2013 08:42 PM   Modules accepted: Orders

## 2013-06-15 NOTE — Assessment & Plan Note (Signed)
For atrial fib-was checked in hospital-working

## 2013-06-15 NOTE — Assessment & Plan Note (Signed)
Difficulty weaning so trach placed;CT showed ARDS and rib fx 3 and 4 from CPR most likely

## 2013-06-15 NOTE — Progress Notes (Signed)
This encounter was created in error - please disregard.

## 2013-06-15 NOTE — Assessment & Plan Note (Signed)
Pt was then found to have multidrug resisitant pseudomonas with intermediate sensitivity to cefepime and tobra and she was treated for 10 days.

## 2013-06-15 NOTE — Assessment & Plan Note (Signed)
Synthroid 100 mcg daily  

## 2013-06-17 ENCOUNTER — Other Ambulatory Visit: Payer: Self-pay | Admitting: *Deleted

## 2013-06-17 MED ORDER — CLONAZEPAM 0.5 MG PO TABS: 0.5000 mg | ORAL_TABLET | Freq: Three times a day (TID) | ORAL | Status: DC

## 2013-06-17 NOTE — Telephone Encounter (Signed)
Rx faxed to [facility] [place] @ [phone]

## 2013-06-17 NOTE — Telephone Encounter (Signed)
rx faxed to [facility] [place] @ [phone]. 

## 2013-06-20 ENCOUNTER — Other Ambulatory Visit: Payer: Self-pay | Admitting: *Deleted

## 2013-06-20 MED ORDER — FENTANYL 50 MCG/HR TD PT72: MEDICATED_PATCH | TRANSDERMAL | Status: DC

## 2013-06-20 NOTE — Telephone Encounter (Signed)
[facility] [place] 

## 2013-06-22 ENCOUNTER — Other Ambulatory Visit: Payer: Self-pay | Admitting: *Deleted

## 2013-06-22 MED ORDER — CLONAZEPAM 0.5 MG PO TABS: ORAL_TABLET | ORAL | Status: DC

## 2013-06-22 MED ORDER — DIPHENOXYLATE-ATROPINE 2.5-0.025 MG PO TABS: ORAL_TABLET | ORAL | Status: DC

## 2013-06-22 NOTE — Telephone Encounter (Signed)
[facility] [place] 

## 2013-06-22 NOTE — Telephone Encounter (Signed)
[facility] [place] 

## 2013-06-23 ENCOUNTER — Encounter: Payer: Self-pay | Admitting: Internal Medicine

## 2013-06-23 ENCOUNTER — Non-Acute Institutional Stay (SKILLED_NURSING_FACILITY): Payer: Medicare Other | Admitting: Internal Medicine

## 2013-06-23 DIAGNOSIS — Z930 Tracheostomy status: Secondary | ICD-10-CM

## 2013-06-23 DIAGNOSIS — Z950 Presence of cardiac pacemaker: Secondary | ICD-10-CM

## 2013-06-23 DIAGNOSIS — Z710 Person encountering health services to consult on behalf of another person: Secondary | ICD-10-CM

## 2013-06-23 DIAGNOSIS — Z931 Gastrostomy status: Secondary | ICD-10-CM

## 2013-06-24 ENCOUNTER — Encounter (HOSPITAL_COMMUNITY): Payer: Self-pay | Admitting: Emergency Medicine

## 2013-06-24 ENCOUNTER — Emergency Department (HOSPITAL_COMMUNITY)
Admission: EM | Admit: 2013-06-24 | Discharge: 2013-06-25 | Disposition: A | Discharge status: Home / Self Care | Payer: Medicare Other | Attending: Emergency Medicine | Admitting: Emergency Medicine

## 2013-06-24 DIAGNOSIS — J9500 Unspecified tracheostomy complication: Secondary | ICD-10-CM

## 2013-06-24 DIAGNOSIS — Z79899 Other long term (current) drug therapy: Secondary | ICD-10-CM | POA: Insufficient documentation

## 2013-06-24 DIAGNOSIS — Z87448 Personal history of other diseases of urinary system: Secondary | ICD-10-CM | POA: Insufficient documentation

## 2013-06-24 DIAGNOSIS — Z794 Long term (current) use of insulin: Secondary | ICD-10-CM | POA: Insufficient documentation

## 2013-06-24 DIAGNOSIS — Z8669 Personal history of other diseases of the nervous system and sense organs: Secondary | ICD-10-CM | POA: Insufficient documentation

## 2013-06-24 DIAGNOSIS — I10 Essential (primary) hypertension: Secondary | ICD-10-CM | POA: Insufficient documentation

## 2013-06-24 DIAGNOSIS — Z950 Presence of cardiac pacemaker: Secondary | ICD-10-CM | POA: Insufficient documentation

## 2013-06-24 DIAGNOSIS — J9503 Malfunction of tracheostomy stoma: Secondary | ICD-10-CM | POA: Insufficient documentation

## 2013-06-24 DIAGNOSIS — Y833 Surgical operation with formation of external stoma as the cause of abnormal reaction of the patient, or of later complication, without mention of misadventure at the time of the procedure: Secondary | ICD-10-CM | POA: Insufficient documentation

## 2013-06-24 DIAGNOSIS — IMO0002 Reserved for concepts with insufficient information to code with codable children: Secondary | ICD-10-CM | POA: Insufficient documentation

## 2013-06-24 DIAGNOSIS — E039 Hypothyroidism, unspecified: Secondary | ICD-10-CM | POA: Insufficient documentation

## 2013-06-24 DIAGNOSIS — Z872 Personal history of diseases of the skin and subcutaneous tissue: Secondary | ICD-10-CM | POA: Insufficient documentation

## 2013-06-24 HISTORY — DX: Cardiac arrest, cause unspecified: I46.9

## 2013-06-24 NOTE — ED Notes (Signed)
Presents from [place] post trach dislodgement. sats 94% RA. Pt is able to speak. Alert and has no other complaints.

## 2013-06-24 NOTE — ED Provider Notes (Signed)
CSN: [id number]     Arrival date & time 06/24/13  2322 History   First MD Initiated Contact with Patient 06/24/13 2333     Chief Complaint  Patient presents with  . Tracheostomy Tube Change     (Consider location/radiation/quality/duration/timing/severity/associated sxs/prior Treatment) The history is provided by the patient.   80 year old female is a resident at [facility] nursing home and has a tracheostomy. Tracheostomy apparently came out this evening. She is not in any distress whatsoever. Tracheostomy had been placed about 2-1/2 months earlier.  Past Medical History  Diagnosis Date  . Hypothyroid   . Complete heart block 2002    Initial PPM 2002 with upgrade by JA to CRT-P 11/21/08  . Nonischemic cardiomyopathy   . Persistent atrial fibrillation   . Hypertension   . Hyperlipidemia   . Hives 4/10  . Cataract 4/13    right  . Renal failure     3rd stage-seeing neurologist  . Cardiac arrest    Past Surgical History  Procedure Laterality Date  . Pacemaker insertion  2002, 2010    PPM 2002, upgrade to CRT-P by Dr Johney FrameAllred (MDT 2010)  . Partial colectomy  1/04  . Abdominal hysterectomy  1987    TAH  . Bunionectomy Right 1996  . Breast biopsy Left 01/1997  . Cataract extraction  4/13    both eyes  . Cyst excision      on back of left ear   Family History  Problem Relation Age of Onset  . Diabetes Maternal Aunt   . Breast cancer Maternal Aunt   . Stroke Mother   . Stroke Father   . Hypothyroidism Sister   . Hypothyroidism Son   . Hypothyroidism Mother   . Hypertension Father   . Kidney disease Father     renal problems   History  Substance Use Topics  . Smoking status: Never Smoker   . Smokeless tobacco: Never Used  . Alcohol Use: No   OB History   Grav Para Term Preterm Abortions TAB SAB Ect Mult Living   2 2 2       2      Review of Systems  All other systems reviewed and are negative.     Allergies  Beta adrenergic blockers; Bystolic; Carvedilol;  Dapsone; Diclofenac sodium; Livalo; Methotrexate; Metoprolol succinate; Oxaprozin; Other; and Epinephrine  Home Medications   Prior to Admission medications   Medication Sig Start Date End Date Taking? Authorizing Provider  Amino Acids-Protein Hydrolys (FEEDING SUPPLEMENT, PRO-STAT SUGAR FREE 64,) LIQD Take 30 mLs by mouth 3 (three) times daily with meals.    Historical Provider, MD  clonazePAM (KLONOPIN) 0.5 MG tablet Take one tablet by mouth every 8 hours for anxiety 06/22/13   Kimber RelicArthur G Green, MD  diphenoxylate-atropine (LOMOTIL) 2.5-0.025 MG per tablet Take one tablet by mouth four times daily as needed for diarrhea 06/22/13   Kimber RelicArthur G Green, MD  famotidine (PEPCID) 20 MG tablet Take 20 mg by mouth 2 (two) times daily.    Historical Provider, MD  fentaNYL (DURAGESIC - DOSED MCG/HR) 50 MCG/HR Remove old patch and then apply one patch topically every 72 hours. Rotate sites 06/20/13   Tiffany L Reed, DO  furosemide (LASIX) 40 MG tablet Take 40 mg by mouth 2 (two) times daily.    Historical Provider, MD  hydrocortisone cream 1 % Apply 1 application topically daily.    Historical Provider, MD  insulin aspart (NOVOLOG) 100 UNIT/ML injection Inject 0-15 Units into the skin  every 4 (four) hours. 05/05/13   Bernadene Person, NP  insulin detemir (LEVEMIR) 100 UNIT/ML injection Inject 12 Units into the skin at bedtime.    Historical Provider, MD  lactobacillus (FLORANEX/LACTINEX) PACK Take 1 g by mouth 2 (two) times daily.    Historical Provider, MD  levothyroxine (SYNTHROID, LEVOTHROID) 75 MCG tablet Place 100 mcg into feeding tube daily before breakfast. 05/06/13   Bernadene Person, NP  morphine 2 MG/ML injection Inject 1 mL (2 mg total) into the vein every 3 (three) hours as needed. 05/05/13   Bernadene Person, NP  Nutritional Supplements (FEEDING SUPPLEMENT, VITAL 1.5 CAL,) LIQD Place 1,000 mLs into feeding tube daily. 05/05/13   Bernadene Person, NP  potassium chloride SA (K-DUR,KLOR-CON) 20 MEQ  tablet Take 20 mEq by mouth daily.    Historical Provider, MD  sertraline (ZOLOFT) 50 MG tablet Take 75 mg by mouth daily.    Historical Provider, MD   BP 131/72  Pulse 95  Temp(Src) 98.8 F (37.1 C) (Oral)  Resp 22  SpO2 94%  LMP 03/03/1985 Physical Exam  Nursing note and vitals reviewed.  80 year old female, resting comfortably and in no acute distress. Vital signs are significant for tachypnea with respiratory rate of 22. Oxygen saturation is 94%, which is normal. Head is normocephalic and atraumatic. PERRLA, EOMI. Oropharynx is clear. Neck is nontender and supple without adenopathy or JVD. Tracheostomy site is clean and without any signs of inflammation. Back is nontender and there is no CVA tenderness. Lungs are clear without rales, wheezes, or rhonchi. Chest is nontender. Heart has regular rate and rhythm without murmur. Abdomen is soft, flat, nontender without masses or hepatosplenomegaly and peristalsis is normoactive. Extremities have no cyanosis or edema, full range of motion is present. Skin is warm and dry without rash. Neurologic: Mental status is normal, cranial nerves are intact, there are no motor or sensory deficits.  ED Course  Procedures (including critical care time) Procedure note:  Tracheostomy tube was inserted through the existing tracheostomy stoma. Indications for procedure: Dislodgment of previous tracheostomy tube. After patient was identified by verbal and with hospital supplied arm band, a number for cuffless Shiley tracheostomy tube was inserted without difficulty. Patient tolerated procedure well. MDM   Final diagnoses:  Complication of tracheostomy    Tracheostomy tube dislodged. She is tolerating this well. The tube was a #4 Shiley but the lumen is occluded and the attachment to place the catheter is not present so a new tube will be placed.    Dione Booze, MD 06/25/13 [phone]

## 2013-06-25 ENCOUNTER — Encounter: Payer: Self-pay | Admitting: Internal Medicine

## 2013-06-25 NOTE — Progress Notes (Signed)
MRN: [id number] Name: [name]  Sex: female Age: 80 y.o. DOB: 06/18/1932  PSC #:  Facility/Room: Level Of Care: SNF Provider: Margit HanksAnne D Laporcha Marchesi Emergency Contacts: Extended Emergency Contact Information Primary Emergency Contact: Clover MealyGlisson,[name] M Address: [address]          [place] Home Phone: [phone] Mobile Phone: [phone] Relation: Spouse  Code Status:   Allergies: Beta adrenergic blockers; Bystolic; Carvedilol; Dapsone; Diclofenac sodium; Livalo; Methotrexate; Metoprolol succinate; Oxaprozin; Other; and Epinephrine  Chief Complaint  Patient presents with  . family conference    HPI: Patient is 80 y.o. female who   Past Medical History  Diagnosis Date  . Hypothyroid   . Complete heart block 2002    Initial PPM 2002 with upgrade by JA to CRT-P 11/21/08  . Nonischemic cardiomyopathy   . Persistent atrial fibrillation   . Hypertension   . Hyperlipidemia   . Hives 4/10  . Cataract 4/13    right  . Renal failure     3rd stage-seeing neurologist  . Cardiac arrest     Past Surgical History  Procedure Laterality Date  . Pacemaker insertion  2002, 2010    PPM 2002, upgrade to CRT-P by Dr Johney FrameAllred (MDT 2010)  . Partial colectomy  1/04  . Abdominal hysterectomy  1987    TAH  . Bunionectomy Right 1996  . Breast biopsy Left 01/1997  . Cataract extraction  4/13    both eyes  . Cyst excision      on back of left ear      Medication List       This list is accurate as of: 06/23/13 11:59 PM.  Always use your most recent med list.               clonazePAM 0.5 MG tablet  Commonly known as:  KLONOPIN  Take one tablet by mouth every 8 hours for anxiety     diphenoxylate-atropine 2.5-0.025 MG per tablet  Commonly known as:  LOMOTIL  Take one tablet by mouth four times daily as needed for diarrhea     famotidine 20 MG tablet  Commonly known as:  PEPCID  Take 20 mg by mouth 2 (two) times daily.     feeding  supplement (PRO-STAT SUGAR FREE 64) Liqd  Take 30 mLs by mouth 3 (three) times daily with meals.     feeding supplement (VITAL 1.5 CAL) Liqd  Place 1,000 mLs into feeding tube daily.     fentaNYL 50 MCG/HR  Commonly known as:  DURAGESIC - dosed mcg/hr  Remove old patch and then apply one patch topically every 72 hours. Rotate sites     furosemide 40 MG tablet  Commonly known as:  LASIX  Take 40 mg by mouth 2 (two) times daily.     hydrocortisone cream 1 %  Apply 1 application topically daily.     insulin aspart 100 UNIT/ML injection  Commonly known as:  novoLOG  Inject 0-15 Units into the skin every 4 (four) hours.     insulin detemir 100 UNIT/ML injection  Commonly known as:  LEVEMIR  Inject 12 Units into the skin at bedtime.     lactobacillus Pack  Take 1 g by mouth 2 (two) times daily.     levothyroxine 75 MCG tablet  Commonly known as:  SYNTHROID, LEVOTHROID  Place 100 mcg into feeding tube daily before breakfast.     morphine 2 MG/ML injection  Inject 1 mL (2  mg total) into the vein every 3 (three) hours as needed.     potassium chloride SA 20 MEQ tablet  Commonly known as:  K-DUR,KLOR-CON  Take 20 mEq by mouth daily.     sertraline 50 MG tablet  Commonly known as:  ZOLOFT  Take 75 mg by mouth daily.        No orders of the defined types were placed in this encounter.     There is no immunization history on file for this patient.  History  Substance Use Topics  . Smoking status: Never Smoker   . Smokeless tobacco: Never Used  . Alcohol Use: No    Review of Systems  DATA OBTAINED: from patient, nurse, family member GENERAL:  no fevers,+ fatigue,;per husband, no appetite, per nursing pt is doing better SKIN: No itching, rash HEENT: No complaint RESPIRATORY: No cough, wheezing, SOB CARDIAC: No chest pain, palpitations, lower extremity edema  GI: No abdominal pain, No N/V/D or constipation, No heartburn or reflux  GU: No dysuria, frequency or  urgency, or incontinence  MUSCULOSKELETAL: No unrelieved bone/joint pain NEUROLOGIC: No headache, dizziness or focal weakness PSYCHIATRIC: No overt anxiety or sadness. Sleeps well.   Filed Vitals:   06/23/13 1734  BP: 110/58  Pulse: 72  Temp: 98.1 F (36.7 C)  Resp: 20    Physical Exam  GENERAL APPEARANCE: Alert, conversant. Appropriately groomed. No acute distress, appearing much stronger than last visit  SKIN: No diaphoresis rash HEENT: Unremarkable RESPIRATORY: Breathing is even, unlabored. Lung sounds are clear   CARDIOVASCULAR: Heart RRR no murmurs, rubs or gallops. No peripheral edema  GASTROINTESTINAL: Abdomen is soft, non-tender, not distended w/ normal bowel sounds.\;TUBE  GENITOURINARY: Bladder non tender, not distended  MUSCULOSKELETAL: No abnormal joints or musculature NEUROLOGIC: Cranial nerves 2-12 grossly intact. Moves all extremities no tremor. PSYCHIATRIC: Mood and affect appropriate to situation, flat, no behavioral issues  Patient Active Problem List   Diagnosis Date Noted  . PEG (percutaneous endoscopic gastrostomy) status 06/15/2013  . Norovirus 06/15/2013  . Pneumonia due to Pseudomonas 06/15/2013  . Cardiac pacemaker in situ 06/15/2013  . DVT of upper extremity (deep vein thrombosis) 05/05/2013  . Hemorrhagic shock 05/02/2013  . Tracheostomy status 04/27/2013  . Acute on chronic respiratory failure 04/25/2013  . Acute delirium-ICU 04/13/2013  . Palliative care encounter 04/13/2013  . Long term current use of amiodarone 04/13/2013  . Respiratory failure 04/05/2013  . CAP (community acquired pneumonia) 04/05/2013  . Sepsis 04/05/2013  . Acute on chronic renal failure 04/05/2013  . Viral pneumonia 04/05/2013  . Hypotension 04/05/2013  . Combined systolic and diastolic heart failure 04/05/2013  . Essential hypertension, benign 03/23/2009  . ATRIOVENTRICULAR BLOCK, COMPLETE 11/09/2008  . Atrial fibrillation 11/09/2008  . Unspecified hypothyroidism  11/08/2008  . OTHER PRIMARY CARDIOMYOPATHIES 11/08/2008  . UNSPECIFIED SYSTOLIC HEART FAILURE 11/08/2008  . SYNCOPE 11/08/2008    CBC    Component Value Date/Time   WBC 15.6* 06/11/2013 0730   WBC 7.9 02/11/2011 1144   RBC 3.63* 06/11/2013 0730   RBC 4.80 02/11/2011 1144   HGB 10.9* 06/11/2013 0730   HGB 12.7 02/11/2011 1144   HCT 34.5* 06/11/2013 0730   HCT 39.4 02/11/2011 1144   PLT PLATELET CLUMPS NOTED ON SMEAR, COUNT APPEARS ADEQUATE 06/11/2013 0730   PLT 308 Platelet count by citrate method 02/11/2011 1144   MCV 95.0 06/11/2013 0730   MCV 82.1 02/11/2011 1144   LYMPHSABS 1.6 05/15/2013 0530   LYMPHSABS 2.0 02/11/2011 1144   MONOABS 0.7  05/15/2013 0530   MONOABS 0.7 02/11/2011 1144   EOSABS 0.1 05/15/2013 0530   EOSABS 0.2 02/11/2011 1144   BASOSABS 0.0 05/15/2013 0530   BASOSABS 0.0 02/11/2011 1144    CMP     Component Value Date/Time   NA 137 06/12/2013 0634   K 4.3 06/12/2013 0634   CL 93* 06/12/2013 0634   CO2 29 06/12/2013 0634   GLUCOSE 104* 06/12/2013 0634   BUN 36* 06/12/2013 0634   CREATININE 0.78 06/12/2013 0634   CALCIUM 8.9 06/12/2013 0634   PROT 4.2* 05/04/2013 1342   ALBUMIN 1.6* 05/04/2013 1342   AST 104* 05/04/2013 1342   ALT 230* 05/04/2013 1342   ALKPHOS 56 05/04/2013 1342   BILITOT 0.9 05/04/2013 1342   GFRNONAA 77* 06/12/2013 0634   GFRAA 90* 06/12/2013 0634    Assessment and Plan  FAMILY CONFERENCE WITHOUT PT PRESENT- pt's husband has multiple concerns and questions, too numerous to recount all here-mostly when will she get better, why isn't she getting better faster, why does she not want to eat and what can he do to help. He's a business man, doesn't have much patience and the slow pace of recovery from her horrific illness is trying on him and we discussed all this. I also spoke with nursing and these are the same questions and answers that they have been fielding. After our conference I spoke with the pt with husband present about some of our conversation.  Time  spent > 45 min  Margit Hanks, MD

## 2013-06-25 NOTE — Discharge Instructions (Signed)
Continue tracheostomy care. Discuss with your doctor when the tracheostomy can be safely left out.  Care of a Tracheostomy Tube Keeping the tracheostomy tube clean helps prevent infections and keeps certain trach tubing from plugging. A tracheostomy tube is commonly known as a trach tube. You may have one tube (an outer cannula), or you may have two tubes (an outer and inner cannula). The inner cannula that fits inside the outer cannula is removed for cleaning or replacement. Follow your caregiver's directions as to how often you should change and clean your trach tube.  SUPPLIES NEEDED  Towel.  Suction supplies.  Sterile trach care kit.  4x4 inch (10x10 cm) gauze pads.  Sterile cotton-tipped swabs.  Sterile trach bandage (dressing).  Sterile container.  0.9% saline solution.  Small sterile brush (or disposable inner cannula).  Roll of twill tape, trach ties, or trach holder.  Scissors.  Clean gloves.  Sterile gloves. TRACHEOSTOMY CARE  1. Have all supplies ready and available. 2. Wash hands well. 3. Put on clean gloves. 4. Suction the trach tube as needed. 5. When suctioning is complete, remove soiled trach dressings, gloves, and suction catheter. Throw away the dressings and coiled catheter in the glove. To coil the catheter, roll the catheter around the fingers. Then, pull the glove off inside out so that catheter remains coiled in glove. 6. Wash hands well. 7. Put on sterile gloves. 8. Fill a container 0.9% saline solution. 9. Give oxygen as needed. 10. Clean the inner cannula. Nondisposable inner cannula.  While only touching the outer part of the trach tube, unlock and remove the inner cannula.  Drop the inner cannula into 0.9% saline solution. The saline will loosen secretions.  Replace the trach collar, trach tube, or ventilator oxygen source over the outer cannula. Do not attach the trach tube and ventilator oxygen devices to all outer cannulas when the inner  cannula is removed.  Quickly pick up the inner cannula out of the saline solution. Use a small brush to remove the secretions on the inside and outside of the inner cannula.  Hold the inner cannula over the container. Rinse the cannula with 0.9% saline solution.  Replace the inner cannula. Secure the locking mechanism.  Give oxygen as needed. Disposable inner cannula.  Remove the new cannula from the packaging.  Take out the inner cannula while touching only the outer part of the trach tube.  Replace the old cannula with the new cannula. Lock it into position.  Throw away the old cannula.  Give oxygen as needed. 11. Clean the outer cannula surfaces with gauze or cotton swabs, extending 2 4 inches (5 10 cm) in all directions under the neck plate. Using a cotton swab, clean the stoma site in a circular motion from the stoma site outward. 12. Dry the skin and the outer cannula by patting the area gently with a dry gauze pad. 13. Secure the trach tube with trach ties or a trach tube holder. 14. Place a sterile dressing around the trach site. 15. Give oxygen as needed. 16. Throw away any used supplies. 17. Remove gloves. 18. Wash hands well. Document Released: 04/01/2006 Document Revised: 02/04/2012 Document Reviewed: 09/26/2011 [facility] Patient Information 2014 [place].

## 2013-06-25 NOTE — ED Notes (Signed)
Report given to [place] at [facility]

## 2013-06-27 ENCOUNTER — Other Ambulatory Visit: Payer: Self-pay | Admitting: *Deleted

## 2013-06-27 MED ORDER — LORAZEPAM 1 MG PO TABS: ORAL_TABLET | ORAL | Status: DC

## 2013-06-27 NOTE — Telephone Encounter (Signed)
[facility] 

## 2013-06-28 ENCOUNTER — Encounter: Payer: Self-pay | Admitting: Nurse Practitioner

## 2013-06-28 ENCOUNTER — Non-Acute Institutional Stay (SKILLED_NURSING_FACILITY): Payer: Medicare Other | Admitting: Nurse Practitioner

## 2013-06-28 DIAGNOSIS — J9600 Acute respiratory failure, unspecified whether with hypoxia or hypercapnia: Secondary | ICD-10-CM

## 2013-06-28 DIAGNOSIS — F32A Depression, unspecified: Secondary | ICD-10-CM

## 2013-06-28 DIAGNOSIS — F341 Dysthymic disorder: Secondary | ICD-10-CM

## 2013-06-28 DIAGNOSIS — F329 Major depressive disorder, single episode, unspecified: Secondary | ICD-10-CM

## 2013-06-28 DIAGNOSIS — F419 Anxiety disorder, unspecified: Secondary | ICD-10-CM

## 2013-06-28 DIAGNOSIS — J9690 Respiratory failure, unspecified, unspecified whether with hypoxia or hypercapnia: Secondary | ICD-10-CM

## 2013-06-28 NOTE — Progress Notes (Signed)
Patient ID: Gwenlyn Found, female   DOB: Dec 14, 1932, 80 y.o.   MRN: [id number]    Nursing Home Location:  [facility]   Place of Service: SNF (31)  PCP: [name], MD  Allergies  Allergen Reactions  . Beta Adrenergic Blockers Other (See Comments)    Not working well for pt.  Sherrie Mustache Hcl] Shortness Of Breath  . Carvedilol Shortness Of Breath    Chest  Discomfort.  . Dapsone Other (See Comments)    Kidney  failure  . Diclofenac Sodium Other (See Comments)    Causes  Fluid  Retention.  Consuelo Pandy [Pitavastatin Calcium] Shortness Of Breath  . Methotrexate Shortness Of Breath    Mouth sores.  . Metoprolol Succinate Hives  . Oxaprozin Diarrhea  . Other     Fragrance mixtures -  Cinol botanicals  &  Plants oil. Cocamidoproy   -   Betaine. Lipstick & other makeup & cleaning materials.   Marland Kitchen Epinephrine     Chief Complaint  Patient presents with  . Acute Visit    HPI:  Patient is 80 y.o. female who has survived cardiac arrest, respiratory failure, drug resistant pseudomonas PNA, hypotension from retroperitoneal bleed from upper ext DVT with super coagulation, who has trach, PEG, pacer, being seen today at the request of nursing, husband with question ands concerns due to trach   Review of Systems:   Provided by staff, pt and husband Review of Systems  Unable to perform ROS: mental acuity  Constitutional: Negative for fever and chills.  Respiratory: Negative for cough, shortness of breath and wheezing.   Cardiovascular: Negative for chest pain.  Gastrointestinal: Negative for abdominal pain, diarrhea and constipation.  Genitourinary: Negative for dysuria, urgency and frequency.  Musculoskeletal: Negative for myalgias.  Skin: Negative for itching and rash.  Neurological: Negative for dizziness and headaches.  Psychiatric/Behavioral: Positive for depression and memory loss. The patient is nervous/anxious.      Past Medical History    Diagnosis Date  . Hypothyroid   . Complete heart block 2002    Initial PPM 2002 with upgrade by JA to CRT-P 11/21/08  . Nonischemic cardiomyopathy   . Persistent atrial fibrillation   . Hypertension   . Hyperlipidemia   . Hives 4/10  . Cataract 4/13    right  . Renal failure     3rd stage-seeing neurologist  . Cardiac arrest    Past Surgical History  Procedure Laterality Date  . Pacemaker insertion  2002, 2010    PPM 2002, upgrade to CRT-P by Dr Johney Frame (MDT 2010)  . Partial colectomy  1/04  . Abdominal hysterectomy  1987    TAH  . Bunionectomy Right 1996  . Breast biopsy Left 01/1997  . Cataract extraction  4/13    both eyes  . Cyst excision      on back of left ear   Social History:   reports that she has never smoked. She has never used smokeless tobacco. She reports that she does not drink alcohol or use illicit drugs.  Family History  Problem Relation Age of Onset  . Diabetes Maternal Aunt   . Breast cancer Maternal Aunt   . Stroke Mother   . Stroke Father   . Hypothyroidism Sister   . Hypothyroidism Son   . Hypothyroidism Mother   . Hypertension Father   . Kidney disease Father     renal problems    Medications: Patient's Medications  New Prescriptions  No medications on file  Previous Medications   AMINO ACIDS-PROTEIN HYDROLYS (FEEDING SUPPLEMENT, PRO-STAT SUGAR FREE 64,) LIQD    Take 30 mLs by mouth 3 (three) times daily with meals.   CLONAZEPAM (KLONOPIN) 0.5 MG TABLET    Take one tablet by mouth every 8 hours for anxiety   DIPHENOXYLATE-ATROPINE (LOMOTIL) 2.5-0.025 MG PER TABLET    Take one tablet by mouth four times daily as needed for diarrhea   FAMOTIDINE (PEPCID) 20 MG TABLET    Take 20 mg by mouth 2 (two) times daily.   FENTANYL (DURAGESIC - DOSED MCG/HR) 50 MCG/HR    Remove old patch and then apply one patch topically every 72 hours. Rotate sites   FUROSEMIDE (LASIX) 40 MG TABLET    Take 40 mg by mouth 2 (two) times daily.   HYDROCORTISONE  CREAM 1 %    Apply 1 application topically daily.   INSULIN ASPART (NOVOLOG) 100 UNIT/ML INJECTION    Inject 0-15 Units into the skin every 4 (four) hours.   INSULIN DETEMIR (LEVEMIR) 100 UNIT/ML INJECTION    Inject 12 Units into the skin at bedtime.   LACTOBACILLUS (FLORANEX/LACTINEX) PACK    Take 1 g by mouth 2 (two) times daily.   LEVOTHYROXINE (SYNTHROID, LEVOTHROID) 75 MCG TABLET    Place 100 mcg into feeding tube daily before breakfast.   LORAZEPAM (ATIVAN) 1 MG TABLET    Take one tablet by mouth or per tube every 6 hours as needed for anxiety   MORPHINE 2 MG/ML INJECTION    Inject 1 mL (2 mg total) into the vein every 3 (three) hours as needed.   NUTRITIONAL SUPPLEMENTS (FEEDING SUPPLEMENT, VITAL 1.5 CAL,) LIQD    Place 1,000 mLs into feeding tube daily.   POTASSIUM CHLORIDE SA (K-DUR,KLOR-CON) 20 MEQ TABLET    Take 20 mEq by mouth daily.   SERTRALINE (ZOLOFT) 50 MG TABLET    Take 75 mg by mouth daily.  Modified Medications   No medications on file  Discontinued Medications   No medications on file     Physical Exam:  Filed Vitals:   06/28/13 1205  BP: 147/81  Pulse: 95  Temp: 97.1 F (36.2 C)  Resp: 20    Physical Exam  Constitutional: She is well-developed, well-nourished, and in no distress.  HENT:  Nose: Nose normal.  Mouth/Throat: Oropharynx is clear and moist. No oropharyngeal exudate.  Eyes: Conjunctivae and EOM are normal. Pupils are equal, round, and reactive to light.  Cardiovascular: Normal rate, regular rhythm and normal heart sounds.   Pulmonary/Chest: Effort normal and breath sounds normal. No respiratory distress.  Abdominal: Soft. Bowel sounds are normal. She exhibits no distension.  Musculoskeletal: She exhibits no edema and no tenderness.  Neurological: She is alert.  Skin: Skin is warm and dry.     Labs reviewed: Basic Metabolic Panel:  Recent Labs  96/06/5401/04/15 1201 05/05/13 0048  05/05/13 1224  06/07/13 0914 06/11/13 0730 06/12/13 0634  NA   --   --   < >  --   < > 139 139 137  K  --   --   < >  --   < > 4.1 3.0* 4.3  CL  --   --   < >  --   < > 96 93* 93*  CO2  --   --   < >  --   < > 29 33* 29  GLUCOSE  --   --   < >  --   < >  137* 123* 104*  BUN  --   --   < >  --   < > 39* 36* 36*  CREATININE  --   --   < >  --   < > 0.73 0.83 0.78  CALCIUM  --   --   < >  --   < > 8.6 8.8 8.9  MG 2.3 2.6*  --  2.5  --   --   --   --   PHOS 2.9 2.5  --  2.4  --   --   --   --   < > = values in this interval not displayed. Liver Function Tests:  Recent Labs  04/16/13 0955 04/30/13 1041 05/03/13 0200 05/04/13 1342  AST 20  --  334* 104*  ALT 22  --  354* 230*  ALKPHOS 53  --  63 56  BILITOT 0.2*  --  1.2 0.9  PROT 5.6*  --  5.0* 4.2*  ALBUMIN 1.6* 1.8* 2.1* 1.6*    Recent Labs  04/13/13 1800 04/16/13 0955  LIPASE 110*  --   AMYLASE 99 50   No results found for this basename: AMMONIA,  in the last 8760 hours CBC:  Recent Labs  04/20/13 1159 04/22/13 0415  05/15/13 0530  06/03/13 0630 06/06/13 1125 06/11/13 0730  WBC 20.9* 22.7*  < > 10.6*  < > 15.6* 14.3* 15.6*  NEUTROABS 20.0* 21.4*  --  8.2*  --   --   --   --   HGB 10.5* 9.1*  < > 8.8*  < > 9.3* 10.3* 10.9*  HCT 33.2* 28.7*  < > 28.4*  < > 29.0* 32.5* 34.5*  MCV 94.9 94.1  < > 101.4*  < > 94.8 93.4 95.0  PLT PLATELET CLUMPS NOTED ON SMEAR, COUNT APPEARS ADEQUATE PLATELET CLUMPS NOTED ON SMEAR, UNABLE TO ESTIMATE  < > 110*  < > PLATELET CLUMPS NOTED ON SMEAR, COUNT APPEARS ADEQUATE PLATELET CLUMPS NOTED ON SMEAR, COUNT APPEARS ADEQUATE PLATELET CLUMPS NOTED ON SMEAR, COUNT APPEARS ADEQUATE  < > = values in this interval not displayed. Cardiac Enzymes:  Recent Labs  04/13/13 1800 04/16/13 0955 04/16/13 1638 04/17/13 0025 05/12/13 1040  CKTOTAL 23 34  --   --   --   CKMB 0.8 1.3  --   --   --   TROPONINI  --  <0.30 <0.30 <0.30 <0.30   BNP: No components found with this basename: POCBNP,  CBG:  Recent Labs  05/05/13 0734 05/05/13 1133  05/05/13 1550  GLUCAP 184* 171* 166*   TSH:  Recent Labs  04/13/13 1800 04/26/13 0500 05/25/13 0612  TSH 2.192 3.239 0.549   A1C: No results found for this basename: HGBA1C   Lipid Panel:  Recent Labs  04/20/13 1159  TRIG 186*      Assessment/Plan 1. Respiratory failure -with trach; pt pulled trach out and went to ed to have re-inserted however did well without trach and ED doctor mentioned to pt and husband about having trach removed, pt using PMV  without difficulty, will consult pulmonary for evaluation of trach removal at this time  2. Anxiety and depression -ativan was added due to agitation and anxiety, will get psych consult at this time.

## 2013-07-01 ENCOUNTER — Inpatient Hospital Stay (HOSPITAL_COMMUNITY)
Admission: EM | Admit: 2013-07-01 | Discharge: 2013-07-08 | DRG: 393 | Disposition: A | Discharge status: Other Acute Inpatient Hospital | Payer: Medicare Other | Attending: Emergency Medicine | Admitting: Emergency Medicine

## 2013-07-01 ENCOUNTER — Inpatient Hospital Stay (HOSPITAL_COMMUNITY): Payer: Medicare Other

## 2013-07-01 ENCOUNTER — Inpatient Hospital Stay (HOSPITAL_COMMUNITY): Payer: Medicare Other | Admitting: Anesthesiology

## 2013-07-01 ENCOUNTER — Encounter (HOSPITAL_COMMUNITY): Payer: Self-pay | Admitting: Emergency Medicine

## 2013-07-01 ENCOUNTER — Encounter (HOSPITAL_COMMUNITY)
Admission: EM | Disposition: A | Discharge status: Nursing Home (Includes ICF) | Payer: Medicare Other | Source: Home / Self Care | Attending: Emergency Medicine

## 2013-07-01 ENCOUNTER — Encounter (HOSPITAL_COMMUNITY): Payer: Medicare Other | Admitting: Anesthesiology

## 2013-07-01 ENCOUNTER — Inpatient Hospital Stay (HOSPITAL_COMMUNITY): Admission: RE | Admit: 2013-07-01 | Payer: Medicare Other | Source: Ambulatory Visit

## 2013-07-01 ENCOUNTER — Emergency Department (HOSPITAL_COMMUNITY): Payer: Medicare Other

## 2013-07-01 DIAGNOSIS — Z823 Family history of stroke: Secondary | ICD-10-CM

## 2013-07-01 DIAGNOSIS — E1169 Type 2 diabetes mellitus with other specified complication: Secondary | ICD-10-CM | POA: Diagnosis present

## 2013-07-01 DIAGNOSIS — Z803 Family history of malignant neoplasm of breast: Secondary | ICD-10-CM

## 2013-07-01 DIAGNOSIS — R6520 Severe sepsis without septic shock: Secondary | ICD-10-CM | POA: Diagnosis present

## 2013-07-01 DIAGNOSIS — I4510 Unspecified right bundle-branch block: Secondary | ICD-10-CM | POA: Diagnosis present

## 2013-07-01 DIAGNOSIS — L03319 Cellulitis of trunk, unspecified: Secondary | ICD-10-CM

## 2013-07-01 DIAGNOSIS — Z79899 Other long term (current) drug therapy: Secondary | ICD-10-CM

## 2013-07-01 DIAGNOSIS — Z794 Long term (current) use of insulin: Secondary | ICD-10-CM

## 2013-07-01 DIAGNOSIS — R5381 Other malaise: Secondary | ICD-10-CM | POA: Diagnosis present

## 2013-07-01 DIAGNOSIS — T797XXA Traumatic subcutaneous emphysema, initial encounter: Secondary | ICD-10-CM | POA: Diagnosis present

## 2013-07-01 DIAGNOSIS — E8779 Other fluid overload: Secondary | ICD-10-CM | POA: Diagnosis not present

## 2013-07-01 DIAGNOSIS — K9420 Gastrostomy complication, unspecified: Secondary | ICD-10-CM

## 2013-07-01 DIAGNOSIS — G931 Anoxic brain damage, not elsewhere classified: Secondary | ICD-10-CM | POA: Diagnosis present

## 2013-07-01 DIAGNOSIS — A419 Sepsis, unspecified organism: Secondary | ICD-10-CM | POA: Diagnosis present

## 2013-07-01 DIAGNOSIS — E872 Acidosis, unspecified: Secondary | ICD-10-CM | POA: Diagnosis present

## 2013-07-01 DIAGNOSIS — E039 Hypothyroidism, unspecified: Secondary | ICD-10-CM | POA: Diagnosis present

## 2013-07-01 DIAGNOSIS — Z950 Presence of cardiac pacemaker: Secondary | ICD-10-CM

## 2013-07-01 DIAGNOSIS — N183 Chronic kidney disease, stage 3 unspecified: Secondary | ICD-10-CM | POA: Diagnosis present

## 2013-07-01 DIAGNOSIS — Z8249 Family history of ischemic heart disease and other diseases of the circulatory system: Secondary | ICD-10-CM | POA: Diagnosis present

## 2013-07-01 DIAGNOSIS — Z930 Tracheostomy status: Secondary | ICD-10-CM

## 2013-07-01 DIAGNOSIS — Z931 Gastrostomy status: Secondary | ICD-10-CM

## 2013-07-01 DIAGNOSIS — K9423 Gastrostomy malfunction: Principal | ICD-10-CM | POA: Diagnosis present

## 2013-07-01 DIAGNOSIS — IMO0002 Reserved for concepts with insufficient information to code with codable children: Secondary | ICD-10-CM | POA: Diagnosis present

## 2013-07-01 DIAGNOSIS — Z9849 Cataract extraction status, unspecified eye: Secondary | ICD-10-CM

## 2013-07-01 DIAGNOSIS — Z888 Allergy status to other drugs, medicaments and biological substances status: Secondary | ICD-10-CM

## 2013-07-01 DIAGNOSIS — F430 Acute stress reaction: Secondary | ICD-10-CM | POA: Diagnosis not present

## 2013-07-01 DIAGNOSIS — G8918 Other acute postprocedural pain: Secondary | ICD-10-CM | POA: Diagnosis not present

## 2013-07-01 DIAGNOSIS — L02219 Cutaneous abscess of trunk, unspecified: Secondary | ICD-10-CM | POA: Diagnosis present

## 2013-07-01 DIAGNOSIS — E46 Unspecified protein-calorie malnutrition: Secondary | ICD-10-CM | POA: Diagnosis present

## 2013-07-01 DIAGNOSIS — L89609 Pressure ulcer of unspecified heel, unspecified stage: Secondary | ICD-10-CM | POA: Diagnosis present

## 2013-07-01 DIAGNOSIS — R410 Disorientation, unspecified: Secondary | ICD-10-CM

## 2013-07-01 DIAGNOSIS — Z833 Family history of diabetes mellitus: Secondary | ICD-10-CM

## 2013-07-01 DIAGNOSIS — I129 Hypertensive chronic kidney disease with stage 1 through stage 4 chronic kidney disease, or unspecified chronic kidney disease: Secondary | ICD-10-CM | POA: Diagnosis present

## 2013-07-01 DIAGNOSIS — R404 Transient alteration of awareness: Secondary | ICD-10-CM

## 2013-07-01 DIAGNOSIS — Y833 Surgical operation with formation of external stoma as the cause of abnormal reaction of the patient, or of later complication, without mention of misadventure at the time of the procedure: Secondary | ICD-10-CM | POA: Diagnosis present

## 2013-07-01 DIAGNOSIS — N289 Disorder of kidney and ureter, unspecified: Secondary | ICD-10-CM

## 2013-07-01 DIAGNOSIS — L8990 Pressure ulcer of unspecified site, unspecified stage: Secondary | ICD-10-CM | POA: Diagnosis present

## 2013-07-01 DIAGNOSIS — D649 Anemia, unspecified: Secondary | ICD-10-CM | POA: Diagnosis present

## 2013-07-01 DIAGNOSIS — F29 Unspecified psychosis not due to a substance or known physiological condition: Secondary | ICD-10-CM | POA: Diagnosis not present

## 2013-07-01 DIAGNOSIS — Z841 Family history of disorders of kidney and ureter: Secondary | ICD-10-CM

## 2013-07-01 DIAGNOSIS — N390 Urinary tract infection, site not specified: Secondary | ICD-10-CM

## 2013-07-01 DIAGNOSIS — J9620 Acute and chronic respiratory failure, unspecified whether with hypoxia or hypercapnia: Secondary | ICD-10-CM | POA: Diagnosis present

## 2013-07-01 DIAGNOSIS — I4891 Unspecified atrial fibrillation: Secondary | ICD-10-CM | POA: Diagnosis present

## 2013-07-01 DIAGNOSIS — Z1635 Resistance to multiple antimicrobial drugs: Secondary | ICD-10-CM

## 2013-07-01 DIAGNOSIS — I428 Other cardiomyopathies: Secondary | ICD-10-CM | POA: Diagnosis present

## 2013-07-01 DIAGNOSIS — Z8674 Personal history of sudden cardiac arrest: Secondary | ICD-10-CM

## 2013-07-01 DIAGNOSIS — F05 Delirium due to known physiological condition: Secondary | ICD-10-CM | POA: Diagnosis not present

## 2013-07-01 DIAGNOSIS — L03311 Cellulitis of abdominal wall: Secondary | ICD-10-CM | POA: Diagnosis present

## 2013-07-01 DIAGNOSIS — N189 Chronic kidney disease, unspecified: Secondary | ICD-10-CM

## 2013-07-01 DIAGNOSIS — E785 Hyperlipidemia, unspecified: Secondary | ICD-10-CM | POA: Diagnosis present

## 2013-07-01 DIAGNOSIS — K9422 Gastrostomy infection: Secondary | ICD-10-CM | POA: Diagnosis present

## 2013-07-01 DIAGNOSIS — N179 Acute kidney failure, unspecified: Secondary | ICD-10-CM

## 2013-07-01 DIAGNOSIS — R6521 Severe sepsis with septic shock: Secondary | ICD-10-CM

## 2013-07-01 HISTORY — PX: DEBRIDEMENT OF ABDOMINAL WALL ABSCESS: SHX6396

## 2013-07-01 LAB — PROTIME-INR
INR: 1.45 (ref 0.00–1.49)
PROTHROMBIN TIME: 17.3 s — AB (ref 11.6–15.2)

## 2013-07-01 LAB — BLOOD GAS, ARTERIAL
Acid-Base Excess: 4.8 mmol/L — ABNORMAL HIGH (ref 0.0–2.0)
BICARBONATE: 28.8 meq/L — AB (ref 20.0–24.0)
Drawn by: 252031
FIO2: 0.4 %
O2 Saturation: 95.5 %
PCO2 ART: 41.5 mmHg (ref 35.0–45.0)
PEEP: 5 cmH2O
PH ART: 7.453 — AB (ref 7.350–7.450)
Patient temperature: 97.4
RATE: 14 resp/min
TCO2: 30.1 mmol/L (ref 0–100)
VT: 500 mL
pO2, Arterial: 77.8 mmHg — ABNORMAL LOW (ref 80.0–100.0)

## 2013-07-01 LAB — CBC WITH DIFFERENTIAL/PLATELET
Basophils Absolute: 0 10*3/uL (ref 0.0–0.1)
Basophils Relative: 0 % (ref 0–1)
EOS ABS: 0 10*3/uL (ref 0.0–0.7)
Eosinophils Relative: 0 % (ref 0–5)
HCT: 41.5 % (ref 36.0–46.0)
HEMOGLOBIN: 13 g/dL (ref 12.0–15.0)
Lymphocytes Relative: 9 % — ABNORMAL LOW (ref 12–46)
Lymphs Abs: 1.8 10*3/uL (ref 0.7–4.0)
MCH: 29.7 pg (ref 26.0–34.0)
MCHC: 31.3 g/dL (ref 30.0–36.0)
MCV: 95 fL (ref 78.0–100.0)
MONOS PCT: 5 % (ref 3–12)
Monocytes Absolute: 1 10*3/uL (ref 0.1–1.0)
NEUTROS ABS: 16.1 10*3/uL — AB (ref 1.7–7.7)
NEUTROS PCT: 86 % — AB (ref 43–77)
Platelets: 270 10*3/uL (ref 150–400)
RBC: 4.37 MIL/uL (ref 3.87–5.11)
RDW: 16.6 % — ABNORMAL HIGH (ref 11.5–15.5)
WBC: 18.9 10*3/uL — ABNORMAL HIGH (ref 4.0–10.5)

## 2013-07-01 LAB — BASIC METABOLIC PANEL
BUN: 33 mg/dL — ABNORMAL HIGH (ref 6–23)
CHLORIDE: 102 meq/L (ref 96–112)
CO2: 29 mEq/L (ref 19–32)
Calcium: 8.4 mg/dL (ref 8.4–10.5)
Creatinine, Ser: 1.01 mg/dL (ref 0.50–1.10)
GFR calc non Af Amer: 52 mL/min — ABNORMAL LOW (ref >90)
GFR, EST AFRICAN AMERICAN: 60 mL/min — AB (ref >90)
Glucose, Bld: 104 mg/dL — ABNORMAL HIGH (ref 70–99)
POTASSIUM: 3.7 meq/L (ref 3.7–5.3)
Sodium: 143 mEq/L (ref 137–147)

## 2013-07-01 LAB — I-STAT CHEM 8, ED
BUN: 45 mg/dL — ABNORMAL HIGH (ref 6–23)
CALCIUM ION: 1.07 mmol/L — AB (ref 1.13–1.30)
CHLORIDE: 99 meq/L (ref 96–112)
Creatinine, Ser: 1.3 mg/dL — ABNORMAL HIGH (ref 0.50–1.10)
Glucose, Bld: 192 mg/dL — ABNORMAL HIGH (ref 70–99)
HEMATOCRIT: 47 % — AB (ref 36.0–46.0)
HEMOGLOBIN: 16 g/dL — AB (ref 12.0–15.0)
POTASSIUM: 4.2 meq/L (ref 3.7–5.3)
Sodium: 141 mEq/L (ref 137–147)
TCO2: 34 mmol/L (ref 0–100)

## 2013-07-01 LAB — GLUCOSE, CAPILLARY
GLUCOSE-CAPILLARY: 109 mg/dL — AB (ref 70–99)
GLUCOSE-CAPILLARY: 109 mg/dL — AB (ref 70–99)
Glucose-Capillary: 151 mg/dL — ABNORMAL HIGH (ref 70–99)

## 2013-07-01 LAB — CBC
HCT: 33.1 % — ABNORMAL LOW (ref 36.0–46.0)
Hemoglobin: 10.2 g/dL — ABNORMAL LOW (ref 12.0–15.0)
MCH: 29.3 pg (ref 26.0–34.0)
MCHC: 30.8 g/dL (ref 30.0–36.0)
MCV: 95.1 fL (ref 78.0–100.0)
PLATELETS: 152 10*3/uL (ref 150–400)
RBC: 3.48 MIL/uL — AB (ref 3.87–5.11)
RDW: 16.7 % — AB (ref 11.5–15.5)
WBC: 14.5 10*3/uL — AB (ref 4.0–10.5)

## 2013-07-01 LAB — MRSA PCR SCREENING
MRSA BY PCR: NEGATIVE
MRSA by PCR: NEGATIVE

## 2013-07-01 LAB — COMPREHENSIVE METABOLIC PANEL
ALK PHOS: 89 U/L (ref 39–117)
ALT: 20 U/L (ref 0–35)
AST: 24 U/L (ref 0–37)
Albumin: 2.4 g/dL — ABNORMAL LOW (ref 3.5–5.2)
BILIRUBIN TOTAL: 0.5 mg/dL (ref 0.3–1.2)
BUN: 41 mg/dL — AB (ref 6–23)
CHLORIDE: 97 meq/L (ref 96–112)
CO2: 27 mEq/L (ref 19–32)
Calcium: 9.6 mg/dL (ref 8.4–10.5)
Creatinine, Ser: 1.13 mg/dL — ABNORMAL HIGH (ref 0.50–1.10)
GFR calc non Af Amer: 45 mL/min — ABNORMAL LOW (ref >90)
GFR, EST AFRICAN AMERICAN: 52 mL/min — AB (ref >90)
GLUCOSE: 194 mg/dL — AB (ref 70–99)
POTASSIUM: 4.1 meq/L (ref 3.7–5.3)
Sodium: 142 mEq/L (ref 137–147)
Total Protein: 7 g/dL (ref 6.0–8.3)

## 2013-07-01 LAB — URINALYSIS, ROUTINE W REFLEX MICROSCOPIC
BILIRUBIN URINE: NEGATIVE
BILIRUBIN URINE: NEGATIVE
Glucose, UA: NEGATIVE mg/dL
Glucose, UA: NEGATIVE mg/dL
KETONES UR: NEGATIVE mg/dL
Ketones, ur: NEGATIVE mg/dL
NITRITE: NEGATIVE
Nitrite: NEGATIVE
PH: 6 (ref 5.0–8.0)
Protein, ur: 100 mg/dL — AB
Protein, ur: 100 mg/dL — AB
Specific Gravity, Urine: 1.021 (ref 1.005–1.030)
Specific Gravity, Urine: 1.015 (ref 1.005–1.030)
UROBILINOGEN UA: 1 mg/dL (ref 0.0–1.0)
Urobilinogen, UA: 0.2 mg/dL (ref 0.0–1.0)
pH: 6.5 (ref 5.0–8.0)

## 2013-07-01 LAB — URINE MICROSCOPIC-ADD ON

## 2013-07-01 LAB — LACTIC ACID, PLASMA
LACTIC ACID, VENOUS: 2.1 mmol/L (ref 0.5–2.2)
Lactic Acid, Venous: 2.9 mmol/L — ABNORMAL HIGH (ref 0.5–2.2)

## 2013-07-01 LAB — I-STAT CG4 LACTIC ACID, ED: LACTIC ACID, VENOUS: 4.27 mmol/L — AB (ref 0.5–2.2)

## 2013-07-01 LAB — CBG MONITORING, ED: Glucose-Capillary: 160 mg/dL — ABNORMAL HIGH (ref 70–99)

## 2013-07-01 LAB — PHOSPHORUS: PHOSPHORUS: 4.1 mg/dL (ref 2.3–4.6)

## 2013-07-01 LAB — TYPE AND SCREEN
ABO/RH(D): A POS
Antibody Screen: NEGATIVE

## 2013-07-01 LAB — MAGNESIUM: Magnesium: 1.9 mg/dL (ref 1.5–2.5)

## 2013-07-01 SURGERY — DEBRIDEMENT OF ABDOMINAL WALL ABSCESS
Anesthesia: General | Site: Abdomen

## 2013-07-01 MED ORDER — SODIUM CHLORIDE 0.9 % IJ SOLN
3.0000 mL | Freq: Two times a day (BID) | INTRAMUSCULAR | Status: DC
  Administered 2013-07-01 – 2013-07-03 (×4): 3 mL via INTRAVENOUS

## 2013-07-01 MED ORDER — SODIUM CHLORIDE 0.9 % IV SOLN
INTRAVENOUS | Status: DC
Start: 2013-07-01 — End: 2013-07-02
  Administered 2013-07-01: 19:00:00 via INTRAVENOUS

## 2013-07-01 MED ORDER — SODIUM CHLORIDE 0.9 % IV SOLN
0.0000 ug/h | INTRAVENOUS | Status: DC
  Administered 2013-07-01: 50 ug/h via INTRAVENOUS
  Filled 2013-07-01: qty 50

## 2013-07-01 MED ORDER — PIPERACILLIN-TAZOBACTAM 3.375 G IVPB 30 MIN
3.3750 g | Freq: Once | INTRAVENOUS | Status: AC
  Administered 2013-07-01: 3.375 g via INTRAVENOUS
  Filled 2013-07-01: qty 50

## 2013-07-01 MED ORDER — INSULIN ASPART 100 UNIT/ML ~~LOC~~ SOLN
0.0000 [IU] | SUBCUTANEOUS | Status: DC
  Administered 2013-07-01: 2 [IU] via SUBCUTANEOUS

## 2013-07-01 MED ORDER — ROCURONIUM BROMIDE 100 MG/10ML IV SOLN
INTRAVENOUS | Status: DC | PRN
  Administered 2013-07-01: 30 mg via INTRAVENOUS
  Administered 2013-07-01: 20 mg via INTRAVENOUS

## 2013-07-01 MED ORDER — LORAZEPAM 2 MG/ML IJ SOLN
0.5000 mg | Freq: Once | INTRAMUSCULAR | Status: AC
  Administered 2013-07-01: 0.5 mg via INTRAVENOUS
  Filled 2013-07-01: qty 1

## 2013-07-01 MED ORDER — MORPHINE SULFATE 2 MG/ML IJ SOLN
2.0000 mg | INTRAMUSCULAR | Status: DC | PRN
  Administered 2013-07-01: 2 mg via INTRAVENOUS
  Filled 2013-07-01: qty 1

## 2013-07-01 MED ORDER — ONDANSETRON HCL 4 MG/2ML IJ SOLN: 4.0000 mg | Freq: Three times a day (TID) | INTRAMUSCULAR | Status: AC | PRN

## 2013-07-01 MED ORDER — IOHEXOL 300 MG/ML  SOLN
80.0000 mL | Freq: Once | INTRAMUSCULAR | Status: AC | PRN
  Administered 2013-07-01: 80 mL via INTRAVENOUS

## 2013-07-01 MED ORDER — SODIUM CHLORIDE 0.9 % IV SOLN
1000.0000 mL | Freq: Once | INTRAVENOUS | Status: AC
  Administered 2013-07-01: 1000 mL via INTRAVENOUS

## 2013-07-01 MED ORDER — PROPOFOL 10 MG/ML IV BOLUS
INTRAVENOUS | Status: DC | PRN
  Administered 2013-07-01: 20 mg via INTRAVENOUS

## 2013-07-01 MED ORDER — PROPOFOL 10 MG/ML IV BOLUS
INTRAVENOUS | Status: AC
  Filled 2013-07-01: qty 20

## 2013-07-01 MED ORDER — INSULIN ASPART 100 UNIT/ML ~~LOC~~ SOLN
2.0000 [IU] | SUBCUTANEOUS | Status: DC
  Administered 2013-07-02: 2 [IU] via SUBCUTANEOUS

## 2013-07-01 MED ORDER — ACETAMINOPHEN 650 MG RE SUPP: 650.0000 mg | Freq: Four times a day (QID) | RECTAL | Status: DC | PRN

## 2013-07-01 MED ORDER — SODIUM CHLORIDE 0.9 % IV SOLN: INTRAVENOUS | Status: AC

## 2013-07-01 MED ORDER — SODIUM BICARBONATE 4.2 % IV SOLN
INTRAVENOUS | Status: DC | PRN
  Administered 2013-07-01 (×2): 25 mL via INTRAVENOUS

## 2013-07-01 MED ORDER — LACTATED RINGERS IV SOLN
INTRAVENOUS | Status: DC | PRN
  Administered 2013-07-01: 15:00:00 via INTRAVENOUS

## 2013-07-01 MED ORDER — FENTANYL BOLUS VIA INFUSION
25.0000 ug | INTRAVENOUS | Status: DC | PRN
  Administered 2013-07-01 – 2013-07-02 (×2): 50 ug via INTRAVENOUS
  Filled 2013-07-01: qty 50

## 2013-07-01 MED ORDER — BIOTENE DRY MOUTH MT LIQD
15.0000 mL | Freq: Four times a day (QID) | OROMUCOSAL | Status: DC
  Administered 2013-07-01 – 2013-07-08 (×26): 15 mL via OROMUCOSAL

## 2013-07-01 MED ORDER — IOHEXOL 300 MG/ML  SOLN
INTRAMUSCULAR | Status: DC | PRN
  Administered 2013-07-01: 17:00:00

## 2013-07-01 MED ORDER — LEVOTHYROXINE SODIUM 100 MCG IV SOLR
50.0000 ug | Freq: Every day | INTRAVENOUS | Status: AC
  Administered 2013-07-01 – 2013-07-02 (×2): 50 ug via INTRAVENOUS
  Filled 2013-07-01 (×2): qty 5

## 2013-07-01 MED ORDER — MIDAZOLAM HCL 2 MG/2ML IJ SOLN
INTRAMUSCULAR | Status: AC
  Filled 2013-07-01: qty 2

## 2013-07-01 MED ORDER — PANTOPRAZOLE SODIUM 40 MG IV SOLR
40.0000 mg | Freq: Every day | INTRAVENOUS | Status: DC
  Administered 2013-07-01: 40 mg via INTRAVENOUS
  Filled 2013-07-01 (×2): qty 40

## 2013-07-01 MED ORDER — FENTANYL CITRATE 0.05 MG/ML IJ SOLN
INTRAMUSCULAR | Status: DC | PRN
  Administered 2013-07-01: 25 ug via INTRAVENOUS
  Administered 2013-07-01: 75 ug via INTRAVENOUS

## 2013-07-01 MED ORDER — FENTANYL CITRATE 0.05 MG/ML IJ SOLN
INTRAMUSCULAR | Status: AC
  Filled 2013-07-01: qty 5

## 2013-07-01 MED ORDER — LORAZEPAM 2 MG/ML IJ SOLN
1.0000 mg | INTRAMUSCULAR | Status: DC | PRN
  Administered 2013-07-02: 1 mg via INTRAVENOUS
  Filled 2013-07-01: qty 1

## 2013-07-01 MED ORDER — 0.9 % SODIUM CHLORIDE (POUR BTL) OPTIME
TOPICAL | Status: DC | PRN
  Administered 2013-07-01: 1000 mL

## 2013-07-01 MED ORDER — IOHEXOL 300 MG/ML  SOLN
25.0000 mL | Freq: Once | INTRAMUSCULAR | Status: AC | PRN
  Administered 2013-07-01: 25 mL via ORAL

## 2013-07-01 MED ORDER — FENTANYL 50 MCG/HR TD PT72
50.0000 ug | MEDICATED_PATCH | TRANSDERMAL | Status: DC
  Administered 2013-07-01 – 2013-07-07 (×3): 50 ug via TRANSDERMAL
  Filled 2013-07-01: qty 2
  Filled 2013-07-01 (×2): qty 1

## 2013-07-01 MED ORDER — HEPARIN SODIUM (PORCINE) 5000 UNIT/ML IJ SOLN
5000.0000 [IU] | Freq: Three times a day (TID) | INTRAMUSCULAR | Status: DC
  Administered 2013-07-01 – 2013-07-04 (×8): 5000 [IU] via SUBCUTANEOUS
  Filled 2013-07-01 (×13): qty 1

## 2013-07-01 MED ORDER — VANCOMYCIN HCL IN DEXTROSE 750-5 MG/150ML-% IV SOLN
750.0000 mg | Freq: Two times a day (BID) | INTRAVENOUS | Status: DC
Start: 2013-07-01 — End: 2013-07-03
  Administered 2013-07-01 – 2013-07-03 (×4): 750 mg via INTRAVENOUS
  Filled 2013-07-01 (×6): qty 150

## 2013-07-01 MED ORDER — PIPERACILLIN-TAZOBACTAM 3.375 G IVPB
3.3750 g | Freq: Three times a day (TID) | INTRAVENOUS | Status: DC
  Administered 2013-07-01 – 2013-07-08 (×21): 3.375 g via INTRAVENOUS
  Filled 2013-07-01 (×24): qty 50

## 2013-07-01 MED ORDER — LORAZEPAM 2 MG/ML IJ SOLN
1.0000 mg | Freq: Once | INTRAMUSCULAR | Status: AC
Start: 2013-07-01 — End: 2013-07-01
  Administered 2013-07-01: 1 mg via INTRAVENOUS
  Filled 2013-07-01: qty 1

## 2013-07-01 MED ORDER — CHLORHEXIDINE GLUCONATE 0.12 % MT SOLN
15.0000 mL | Freq: Two times a day (BID) | OROMUCOSAL | Status: DC
  Administered 2013-07-01 – 2013-07-08 (×14): 15 mL via OROMUCOSAL
  Filled 2013-07-01 (×13): qty 15

## 2013-07-01 MED ORDER — FENTANYL CITRATE 0.05 MG/ML IJ SOLN
50.0000 ug | Freq: Once | INTRAMUSCULAR | Status: AC
  Administered 2013-07-01: 50 ug via INTRAVENOUS
  Filled 2013-07-01: qty 2

## 2013-07-01 MED ORDER — SODIUM CHLORIDE 0.9 % IV SOLN: 1000.0000 mL | INTRAVENOUS | Status: DC

## 2013-07-01 MED ORDER — VANCOMYCIN HCL IN DEXTROSE 1-5 GM/200ML-% IV SOLN
1000.0000 mg | Freq: Once | INTRAVENOUS | Status: AC
  Administered 2013-07-01: 1000 mg via INTRAVENOUS
  Filled 2013-07-01: qty 200

## 2013-07-01 SURGICAL SUPPLY — 61 items
BANDAGE GAUZE ELAST BULKY 4 IN (GAUZE/BANDAGES/DRESSINGS) ×2 IMPLANT
BLADE SURG ROTATE 9660 (MISCELLANEOUS) IMPLANT
BNDG GAUZE ELAST 4 BULKY (GAUZE/BANDAGES/DRESSINGS) ×2 IMPLANT
CANISTER SUCTION 2500CC (MISCELLANEOUS) ×3 IMPLANT
CATH MALECOT BARD  24FR (CATHETERS) ×2
CATH MALECOT BARD 24FR (CATHETERS) IMPLANT
CHLORAPREP W/TINT 26ML (MISCELLANEOUS) ×1 IMPLANT
COVER MAYO STAND STRL (DRAPES) ×2 IMPLANT
COVER SURGICAL LIGHT HANDLE (MISCELLANEOUS) ×3 IMPLANT
DRAIN PENROSE 1/2X12 LTX STRL (WOUND CARE) ×12 IMPLANT
DRAPE C-ARM 42X72 X-RAY (DRAPES) ×2 IMPLANT
DRAPE LAPAROSCOPIC ABDOMINAL (DRAPES) ×3 IMPLANT
DRAPE PROXIMA HALF (DRAPES) IMPLANT
DRAPE UTILITY 15X26 W/TAPE STR (DRAPE) ×6 IMPLANT
DRAPE WARM FLUID 44X44 (DRAPE) ×3 IMPLANT
DRSG OPSITE POSTOP 4X10 (GAUZE/BANDAGES/DRESSINGS) IMPLANT
DRSG OPSITE POSTOP 4X8 (GAUZE/BANDAGES/DRESSINGS) IMPLANT
DRSG PAD ABDOMINAL 8X10 ST (GAUZE/BANDAGES/DRESSINGS) ×2 IMPLANT
ELECT BLADE 6.5 EXT (BLADE) IMPLANT
ELECT CAUTERY BLADE 6.4 (BLADE) ×4 IMPLANT
ELECT REM PT RETURN 9FT ADLT (ELECTROSURGICAL) ×3
ELECTRODE REM PT RTRN 9FT ADLT (ELECTROSURGICAL) ×1 IMPLANT
EVACUATOR SILICONE 100CC (DRAIN) ×4 IMPLANT
GLOVE BIO SURGEON STRL SZ7.5 (GLOVE) ×5 IMPLANT
GLOVE BIOGEL PI IND STRL 7.0 (GLOVE) IMPLANT
GLOVE BIOGEL PI IND STRL 8 (GLOVE) ×1 IMPLANT
GLOVE BIOGEL PI INDICATOR 7.0 (GLOVE) ×2
GLOVE BIOGEL PI INDICATOR 8 (GLOVE) ×2
GLOVE SURG SS PI 7.0 STRL IVOR (GLOVE) ×2 IMPLANT
GOWN STRL REUS W/ TWL LRG LVL3 (GOWN DISPOSABLE) ×2 IMPLANT
GOWN STRL REUS W/ TWL XL LVL3 (GOWN DISPOSABLE) ×1 IMPLANT
GOWN STRL REUS W/TWL LRG LVL3 (GOWN DISPOSABLE) ×6
GOWN STRL REUS W/TWL XL LVL3 (GOWN DISPOSABLE) ×3
KIT BASIN OR (CUSTOM PROCEDURE TRAY) ×3 IMPLANT
KIT ROOM TURNOVER OR (KITS) ×3 IMPLANT
LIGASURE IMPACT 36 18CM CVD LR (INSTRUMENTS) IMPLANT
NS IRRIG 1000ML POUR BTL (IV SOLUTION) ×10 IMPLANT
PACK GENERAL/GYN (CUSTOM PROCEDURE TRAY) ×3 IMPLANT
PAD ARMBOARD 7.5X6 YLW CONV (MISCELLANEOUS) ×6 IMPLANT
PAD DEFIB R2 (MISCELLANEOUS) ×2 IMPLANT
PENCIL BUTTON HOLSTER BLD 10FT (ELECTRODE) IMPLANT
PLUG CATH AND CAP STER (CATHETERS) ×2 IMPLANT
SPECIMEN JAR LARGE (MISCELLANEOUS) IMPLANT
SPONGE GAUZE 4X4 12PLY (GAUZE/BANDAGES/DRESSINGS) ×2 IMPLANT
SPONGE LAP 18X18 X RAY DECT (DISPOSABLE) IMPLANT
STAPLER VISISTAT 35W (STAPLE) ×1 IMPLANT
SUCTION POOLE TIP (SUCTIONS) ×3 IMPLANT
SUT ETHILON 2 0 FS 18 (SUTURE) ×6 IMPLANT
SUT PDS AB 1 TP1 96 (SUTURE) ×6 IMPLANT
SUT SILK 2 0 SH CR/8 (SUTURE) ×3 IMPLANT
SUT SILK 2 0 TIES 10X30 (SUTURE) ×3 IMPLANT
SUT SILK 3 0 SH CR/8 (SUTURE) ×3 IMPLANT
SUT SILK 3 0 TIES 10X30 (SUTURE) ×3 IMPLANT
SYR TOOMEY 50ML (SYRINGE) ×2 IMPLANT
SYRINGE IRR TOOMEY STRL 70CC (SYRINGE) ×2 IMPLANT
TAPE CLOTH SURG 6X10 WHT LF (GAUZE/BANDAGES/DRESSINGS) ×2 IMPLANT
TOWEL OR 17X26 10 PK STRL BLUE (TOWEL DISPOSABLE) ×3 IMPLANT
TRAY FOLEY CATH 16FRSI W/METER (SET/KITS/TRAYS/PACK) IMPLANT
TUBE CONNECTING 12'X1/4 (SUCTIONS)
TUBE CONNECTING 12X1/4 (SUCTIONS) IMPLANT
YANKAUER SUCT BULB TIP NO VENT (SUCTIONS) IMPLANT

## 2013-07-01 NOTE — Progress Notes (Signed)
eLink Physician-Brief Progress Note Patient Name: [name] DOB: [birth year] MRN: [id number]  Date of Service  07/01/2013   HPI/Events of Note   Agitated post op on vent. In Pain  eICU Interventions  fentanuyl gtt and prn   Intervention Category Major Interventions: Delirium, psychosis, severe agitation - evaluation and management  Ambika Zettlemoyer 07/01/2013, 6:21 PM

## 2013-07-01 NOTE — Anesthesia Preprocedure Evaluation (Signed)
Anesthesia Evaluation  Patient identified by MRN, date of birth, ID band  Reviewed: Unable to perform ROS - Chart review only  Airway       Dental   Pulmonary pneumonia -,  Trach         Cardiovascular hypertension, + dysrhythmias + pacemaker     Neuro/Psych    GI/Hepatic Abdominal catastrophe,free aire   Endo/Other  Hypothyroidism   Renal/GU ARFRenal disease     Musculoskeletal   Abdominal   Peds  Hematology   Anesthesia Other Findings   Reproductive/Obstetrics                           Anesthesia Physical Anesthesia Plan  ASA: IV and emergent  Anesthesia Plan: General   Post-op Pain Management:    Induction: Inhalational and Intravenous  Airway Management Planned: Tracheostomy  Additional Equipment:   Intra-op Plan:   Post-operative Plan: Post-operative intubation/ventilation  Informed Consent: I have reviewed the patients History and Physical, chart, labs and discussed the procedure including the risks, benefits and alternatives for the proposed anesthesia with the patient or authorized representative who has indicated his/her understanding and acceptance.     Plan Discussed with: CRNA and Surgeon  Anesthesia Plan Comments:         Anesthesia Quick Evaluation

## 2013-07-01 NOTE — ED Provider Notes (Signed)
CSN: [id number]     Arrival date & time 07/01/13  0223 History   First MD Initiated Contact with Patient 07/01/13 0240     Chief Complaint  Patient presents with  . Abdominal Pain  . Fever     (Consider location/radiation/quality/duration/timing/severity/associated sxs/prior Treatment) HPI Comments: 80 year old female with a history of a pacemaker secondary to complete heart block, hypertension and chronic altered mental status. She presents to the hospital with a fever and the tachycardia. He was noted this evening that the patient had redness extending across her abdomen surrounding the gastric feeding tube. This has been persistent over the evening, nothing since made better or worse, there has been no associated vomiting or diarrhea and the patient has not been coughing.  Patient is a 80 y.o. female presenting with abdominal pain and fever. The history is provided by the EMS personnel and the nursing home.  Abdominal Pain Associated symptoms: fever   Fever   Past Medical History  Diagnosis Date  . Hypothyroid   . Complete heart block 2002    Initial PPM 2002 with upgrade by JA to CRT-P 11/21/08  . Nonischemic cardiomyopathy   . Persistent atrial fibrillation   . Hypertension   . Hyperlipidemia   . Hives 4/10  . Cataract 4/13    right  . Renal failure     3rd stage-seeing neurologist  . Cardiac arrest    Past Surgical History  Procedure Laterality Date  . Pacemaker insertion  2002, 2010    PPM 2002, upgrade to CRT-P by Dr Johney FrameAllred (MDT 2010)  . Partial colectomy  1/04  . Abdominal hysterectomy  1987    TAH  . Bunionectomy Right 1996  . Breast biopsy Left 01/1997  . Cataract extraction  4/13    both eyes  . Cyst excision      on back of left ear   Family History  Problem Relation Age of Onset  . Diabetes Maternal Aunt   . Breast cancer Maternal Aunt   . Stroke Mother   . Stroke Father   . Hypothyroidism Sister   . Hypothyroidism Son   . Hypothyroidism Mother    . Hypertension Father   . Kidney disease Father     renal problems   History  Substance Use Topics  . Smoking status: Never Smoker   . Smokeless tobacco: Never Used  . Alcohol Use: No   OB History   Grav Para Term Preterm Abortions TAB SAB Ect Mult Living   2 2 2       2      Review of Systems  Unable to perform ROS: Mental status change  Constitutional: Positive for fever.  Gastrointestinal: Positive for abdominal pain.      Allergies  Beta adrenergic blockers; Bystolic; Carvedilol; Dapsone; Diclofenac sodium; Livalo; Methotrexate; Metoprolol succinate; Oxaprozin; Other; and Epinephrine  Home Medications   Prior to Admission medications   Medication Sig Start Date End Date Taking? Authorizing Provider  Amino Acids-Protein Hydrolys (FEEDING SUPPLEMENT, PRO-STAT SUGAR FREE 64,) LIQD Take 30 mLs by mouth 3 (three) times daily with meals.    Historical Provider, MD  clonazePAM (KLONOPIN) 0.5 MG tablet Take one tablet by mouth every 8 hours for anxiety 06/22/13   Kimber RelicArthur G Green, MD  diphenoxylate-atropine (LOMOTIL) 2.5-0.025 MG per tablet Take one tablet by mouth four times daily as needed for diarrhea 06/22/13   Kimber RelicArthur G Green, MD  famotidine (PEPCID) 20 MG tablet Take 20 mg by mouth 2 (two) times daily.  Historical Provider, MD  fentaNYL (DURAGESIC - DOSED MCG/HR) 50 MCG/HR Remove old patch and then apply one patch topically every 72 hours. Rotate sites 06/20/13   Tiffany L Reed, DO  furosemide (LASIX) 40 MG tablet Take 40 mg by mouth 2 (two) times daily.    Historical Provider, MD  hydrocortisone cream 1 % Apply 1 application topically daily.    Historical Provider, MD  insulin aspart (NOVOLOG) 100 UNIT/ML injection Inject 0-15 Units into the skin every 4 (four) hours. 05/05/13   Bernadene Person, NP  insulin detemir (LEVEMIR) 100 UNIT/ML injection Inject 12 Units into the skin at bedtime.    Historical Provider, MD  lactobacillus (FLORANEX/LACTINEX) PACK Take 1 g by mouth 2  (two) times daily.    Historical Provider, MD  levothyroxine (SYNTHROID, LEVOTHROID) 75 MCG tablet Place 100 mcg into feeding tube daily before breakfast. 05/06/13   Bernadene Person, NP  LORazepam (ATIVAN) 1 MG tablet Take one tablet by mouth or per tube every 6 hours as needed for anxiety 06/27/13   Tiffany L Reed, DO  morphine 2 MG/ML injection Inject 1 mL (2 mg total) into the vein every 3 (three) hours as needed. 05/05/13   Bernadene Person, NP  Nutritional Supplements (FEEDING SUPPLEMENT, VITAL 1.5 CAL,) LIQD Place 1,000 mLs into feeding tube daily. 05/05/13   Bernadene Person, NP  potassium chloride SA (K-DUR,KLOR-CON) 20 MEQ tablet Take 20 mEq by mouth daily.    Historical Provider, MD  sertraline (ZOLOFT) 50 MG tablet Take 75 mg by mouth daily.    Historical Provider, MD   BP 123/43  Pulse 93  Resp 27  SpO2 97%  LMP 03/03/1985 Physical Exam  Nursing note and vitals reviewed. Constitutional: She appears well-developed and well-nourished. No distress.  HENT:  Head: Normocephalic and atraumatic.  Mouth/Throat: No oropharyngeal exudate.  MM dry  Eyes: Conjunctivae and EOM are normal. Pupils are equal, round, and reactive to light. Right eye exhibits no discharge. Left eye exhibits no discharge. No scleral icterus.  Neck: Normal range of motion. Neck supple. No JVD present. No thyromegaly present.  Cardiovascular: Regular rhythm and intact distal pulses.  Exam reveals no gallop and no friction rub.   Murmur heard. Tachycardia, murmur  Pulmonary/Chest: Effort normal and breath sounds normal. No respiratory distress. She has no wheezes. She has no rales.  Abdominal: Soft. Bowel sounds are normal. She exhibits no distension and no mass. There is tenderness.  Tenderness across the mid abdomen associated with erythema and induration surrounding the gastric tube. No drainage from gastric tube site  Musculoskeletal: Normal range of motion. She exhibits no edema and no tenderness.   Lymphadenopathy:    She has no cervical adenopathy.  Neurological: She is alert.  Mumbles incoherently, moving all extremities x4  Skin: Skin is warm and dry. Rash noted. There is erythema.  Psychiatric: She has a normal mood and affect. Her behavior is normal.    ED Course  Procedures (including critical care time) Labs Review Labs Reviewed  CBC WITH DIFFERENTIAL - Abnormal; Notable for the following:    WBC 18.9 (*)    RDW 16.6 (*)    Neutrophils Relative % 86 (*)    Neutro Abs 16.1 (*)    Lymphocytes Relative 9 (*)    All other components within normal limits  COMPREHENSIVE METABOLIC PANEL - Abnormal; Notable for the following:    Glucose, Bld 194 (*)    BUN 41 (*)    Creatinine, Ser 1.13 (*)  Albumin 2.4 (*)    GFR calc non Af Amer 45 (*)    GFR calc Af Amer 52 (*)    All other components within normal limits  URINALYSIS, ROUTINE W REFLEX MICROSCOPIC - Abnormal; Notable for the following:    APPearance TURBID (*)    Hgb urine dipstick LARGE (*)    Protein, ur 100 (*)    Leukocytes, UA LARGE (*)    All other components within normal limits  URINE MICROSCOPIC-ADD ON - Abnormal; Notable for the following:    Bacteria, UA MANY (*)    Crystals CA OXALATE CRYSTALS (*)    All other components within normal limits  I-STAT CG4 LACTIC ACID, ED - Abnormal; Notable for the following:    Lactic Acid, Venous 4.27 (*)    All other components within normal limits  I-STAT CHEM 8, ED - Abnormal; Notable for the following:    BUN 45 (*)    Creatinine, Ser 1.30 (*)    Glucose, Bld 192 (*)    Calcium, Ion 1.07 (*)    Hemoglobin 16.0 (*)    HCT 47.0 (*)    All other components within normal limits  CULTURE, BLOOD (ROUTINE X 2)  CULTURE, BLOOD (ROUTINE X 2)  URINE CULTURE  LACTIC ACID, PLASMA    Imaging Review No results found.    MDM   Final diagnoses:  Sepsis  Cellulitis of abdominal wall  Renal insufficiency  UTI (lower urinary tract infection)    The patient  is febrile tachycardic, definitely has sepsis related to an abdominal infection secondary to the skin infection cellulitis and induration surrounding the gastric tube site. She will be labs, blood cultures, antibiotics and admission to the hospital. She does appear acutely ill.  101.2 rectal temp.  The pt has elevated WBC, elevated > 4 lactic acid - septic, fluids, abx broad spectrum.  Transiently hypotensive, responded to fluids.  Full Code\ already has trach as needed  Will d/w hospitalist.  CRITICAL CARE Performed by: Vida Roller Total critical care time: 35 Critical care time was exclusive of separately billable procedures and treating other patients. Critical care was necessary to treat or prevent imminent or life-threatening deterioration. Critical care was time spent personally by me on the following activities: development of treatment plan with patient and/or surrogate as well as nursing, discussions with consultants, evaluation of patient's response to treatment, examination of patient, obtaining history from patient or surrogate, ordering and performing treatments and interventions, ordering and review of laboratory studies, ordering and review of radiographic studies, pulse oximetry and re-evaluation of patient's condition.   Meds given in ED:  Medications  0.9 %  sodium chloride infusion (0 mLs Intravenous Stopped 07/01/13 0440)    Followed by  0.9 %  sodium chloride infusion (0 mLs Intravenous Stopped 07/01/13 0425)    Followed by  0.9 %  sodium chloride infusion (not administered)  fentaNYL (DURAGESIC - dosed mcg/hr) patch 50 mcg (not administered)  morphine 2 MG/ML injection 2 mg (not administered)  levothyroxine (SYNTHROID, LEVOTHROID) injection 50 mcg (not administered)  acetaminophen (TYLENOL) suppository 650 mg (not administered)  heparin injection 5,000 Units (not administered)  sodium chloride 0.9 % injection 3 mL (not administered)  insulin aspart (novoLOG)  injection 0-9 Units (not administered)  vancomycin (VANCOCIN) IVPB 750 mg/150 ml premix (not administered)  piperacillin-tazobactam (ZOSYN) IVPB 3.375 g (not administered)  piperacillin-tazobactam (ZOSYN) IVPB 3.375 g (0 g Intravenous Stopped 07/01/13 0355)  vancomycin (VANCOCIN) IVPB 1000 mg/200 mL premix (0  mg Intravenous Stopped 07/01/13 0413)  LORazepam (ATIVAN) injection 0.5 mg (0.5 mg Intravenous Given 07/01/13 0342)  iohexol (OMNIPAQUE) 300 MG/ML solution 25 mL (25 mLs Oral Contrast Given 07/01/13 0507)    New Prescriptions   No medications on file      Vida Roller, MD 07/01/13 [phone]

## 2013-07-01 NOTE — H&P (Addendum)
[facility] History and Physical  [name] Tibbett XBJ:[id number] DOB: 06/15/1932 DOA: 07/01/2013  Referring physician: EDP PCP: Astrid DivineGRIFFIN,ELAINE COLLINS, MD   Chief Complaint: Abdominal pain, fever   HPI: [name] Sedgwick is a 80 y.o. female with h/o pacemaker due to heart block, chronic AMS (currently at her poor baseline, presumably this is due to her history of cardiac arrest), in nursing home, chronic trach and peg.  She presents to the ED with fever and tachycardia.  It was noted by her SNF this evening that patient had redness extending across her abdomen surrounding the gastric feeding tube.  This has been persistent this evening.  Nothing has made it better or worse.  No vomiting.  Further history not available at this time.  Review of Systems: Unable to perform due to poor baseline mental status.  Past Medical History  Diagnosis Date  . Hypothyroid   . Complete heart block 2002    Initial PPM 2002 with upgrade by JA to CRT-P 11/21/08  . Nonischemic cardiomyopathy   . Persistent atrial fibrillation   . Hypertension   . Hyperlipidemia   . Hives 4/10  . Cataract 4/13    right  . Renal failure     3rd stage-seeing neurologist  . Cardiac arrest    Past Surgical History  Procedure Laterality Date  . Pacemaker insertion  2002, 2010    PPM 2002, upgrade to CRT-P by Dr Johney FrameAllred (MDT 2010)  . Partial colectomy  1/04  . Abdominal hysterectomy  1987    TAH  . Bunionectomy Right 1996  . Breast biopsy Left 01/1997  . Cataract extraction  4/13    both eyes  . Cyst excision      on back of left ear   Social History:  reports that she has never smoked. She has never used smokeless tobacco. She reports that she does not drink alcohol or use illicit drugs.  Allergies  Allergen Reactions  . Beta Adrenergic Blockers Other (See Comments)    Not working well for pt.  Sherrie Mustache. Bystolic [Nebivolol Hcl] Shortness Of Breath  . Carvedilol Shortness Of Breath    Chest  Discomfort.  .  Dapsone Other (See Comments)    Kidney  failure  . Diclofenac Sodium Other (See Comments)    Causes  Fluid  Retention.  Consuelo Pandy. Livalo [Pitavastatin Calcium] Shortness Of Breath  . Methotrexate Shortness Of Breath    Mouth sores.  . Metoprolol Succinate Hives  . Oxaprozin Diarrhea  . Other     Fragrance mixtures -  Cinol botanicals  &  Plants oil. Cocamidoproy   -   Betaine. Lipstick & other makeup & cleaning materials.   Marland Kitchen. Epinephrine     Family History  Problem Relation Age of Onset  . Diabetes Maternal Aunt   . Breast cancer Maternal Aunt   . Stroke Mother   . Stroke Father   . Hypothyroidism Sister   . Hypothyroidism Son   . Hypothyroidism Mother   . Hypertension Father   . Kidney disease Father     renal problems     Prior to Admission medications   Medication Sig Start Date End Date Taking? Authorizing Provider  Amino Acids-Protein Hydrolys (FEEDING SUPPLEMENT, PRO-STAT SUGAR FREE 64,) LIQD Take 30 mLs by mouth 3 (three) times daily with meals.   Yes Historical Provider, MD  clonazePAM (KLONOPIN) 0.5 MG tablet Take one tablet by mouth every 8 hours for anxiety 06/22/13  Yes Kimber RelicArthur Fitzgerald Green, MD  famotidine (  PEPCID) 20 MG tablet Take 20 mg by mouth 2 (two) times daily.   Yes Historical Provider, MD  furosemide (LASIX) 40 MG tablet Take 40 mg by mouth 2 (two) times daily.   Yes Historical Provider, MD  insulin aspart (NOVOLOG) 100 UNIT/ML injection Inject 3 Units into the skin once. For CBG over 150   Yes Historical Provider, MD  insulin detemir (LEVEMIR) 100 UNIT/ML injection Inject 12 Units into the skin at bedtime.   Yes Historical Provider, MD  lactobacillus (FLORANEX/LACTINEX) PACK Take 1 Fitzgerald by mouth 2 (two) times daily.   Yes Historical Provider, MD  levothyroxine (SYNTHROID, LEVOTHROID) 100 MCG tablet Take 100 mcg by mouth daily before breakfast.   Yes Historical Provider, MD  Nutritional Supplements (FEEDING SUPPLEMENT, VITAL 1.5 CAL,) LIQD Place 1,000 mLs into feeding tube  daily. 05/05/13  Yes Bernadene Person, NP  Nutritional Supplements (GLUCERNA 1.5 CAL PO) Take by mouth.   Yes Historical Provider, MD  potassium chloride SA (K-DUR,KLOR-CON) 20 MEQ tablet Take 20 mEq by mouth daily.   Yes Historical Provider, MD  sertraline (ZOLOFT) 25 MG tablet Take 25 mg by mouth daily. Take with 50mg  tablet. Total 75mg    Yes Historical Provider, MD  sertraline (ZOLOFT) 50 MG tablet Take 50 mg by mouth daily. Take with 25mg  tablet. Total 75mg .   Yes Historical Provider, MD  diphenoxylate-atropine (LOMOTIL) 2.5-0.025 MG per tablet Take one tablet by mouth four times daily as needed for diarrhea 06/22/13   Kimber Relic, MD  fentaNYL (DURAGESIC - DOSED MCG/HR) 50 MCG/HR Remove old patch and then apply one patch topically every 72 hours. Rotate sites 06/20/13   Tiffany L Reed, DO  hydrocortisone cream 1 % Apply 1 application topically daily.    Historical Provider, MD  LORazepam (ATIVAN) 1 MG tablet Take one tablet by mouth or per tube every 6 hours as needed for anxiety 06/27/13   Tiffany L Reed, DO  morphine 2 MG/ML injection Inject 1 mL (2 mg total) into the vein every 3 (three) hours as needed. 05/05/13   Bernadene Person, NP   Physical Exam: Filed Vitals:   07/01/13 0430  BP: 123/43  Pulse: 93  Resp: 27    BP 123/43  Pulse 93  Resp 27  SpO2 97%  LMP 03/03/1985  General Appearance:    no distress, appears stated age  Head:    Normocephalic, atraumatic  Eyes:    PERRL, EOMI, sclera non-icteric        Nose:   Nares without drainage or epistaxis. Mucosa, turbinates normal  Throat:   Moist mucous membranes. Oropharynx without erythema or exudate.  Neck:   Supple. No carotid bruits.  No thyromegaly.  No lymphadenopathy.   Back:     No CVA tenderness, no spinal tenderness  Lungs:     Clear to auscultation bilaterally, without wheezes, rhonchi or rales  Chest wall:    No tenderness to palpitation  Heart:    Regular rate and rhythm without murmurs, gallops, rubs   Abdomen:     Tender across mid abdomen with erythema and induration surrounding the gastric tube and going off the left side of the abdomen.  No drainage from the gastric tube site.  Genitalia:    deferred  Rectal:    deferred  Extremities:   No clubbing, cyanosis or edema.  Pulses:   2+ and symmetric all extremities  Skin:   Skin color, texture, turgor normal, no rashes or lesions  Lymph nodes:  Cervical, supraclavicular, and axillary nodes normal  Neurologic:   MAE, mumbles incoherently    Labs on Admission:  Basic Metabolic Panel:  Recent Labs Lab 07/01/13 0257 07/01/13 0314  NA 142 141  K 4.1 4.2  CL 97 99  CO2 27  --   GLUCOSE 194* 192*  BUN 41* 45*  CREATININE 1.13* 1.30*  CALCIUM 9.6  --    Liver Function Tests:  Recent Labs Lab 07/01/13 0257  AST 24  ALT 20  ALKPHOS 89  BILITOT 0.5  PROT 7.0  ALBUMIN 2.4*   No results found for this basename: LIPASE, AMYLASE,  in the last 168 hours No results found for this basename: AMMONIA,  in the last 168 hours CBC:  Recent Labs Lab 07/01/13 0257 07/01/13 0314  WBC 18.9*  --   NEUTROABS 16.1*  --   HGB 13.0 16.0*  HCT 41.5 47.0*  MCV 95.0  --   PLT 270  --    Cardiac Enzymes: No results found for this basename: CKTOTAL, CKMB, CKMBINDEX, TROPONINI,  in the last 168 hours  BNP (last 3 results)  Recent Labs  04/13/13 0447 04/15/13 0410 04/16/13 0430  PROBNP 2694.0* 2271.0* 3345.0*   CBG: No results found for this basename: GLUCAP,  in the last 168 hours  Radiological Exams on Admission: No results found.  EKG: Independently reviewed.  Assessment/Plan Principal Problem:   Severe sepsis Active Problems:   Unspecified hypothyroidism   Tracheostomy status   Abdominal wall cellulitis   1. Severe sepsis - Source is abdomen, CT scan ordered to help determine if there is an abscess present and wether the infection is limited to abdominal wall or extends deeper.  No crepitus felt on exam at this  time.  Tenderness does not seem to extend beyond the area of redness on exam.  Zosyn and vanc ordered, admitting to SDU, transiently hypotensive in ED and appears pre-renal on lab work, thankfully BP responded to IVF.  Will continue IVF. 2. Possible UTI - UA suspicious for UTI, culture pending, patient already on zosyn and vanc for #1 above, given the obvious abdominal exam findings and erythema, doubt that UTI is the primary source of sepsis. 3. DM - holding lantus and will instead put patient on Q4H SSI low dose, she is NPO at this time as I do not want to use the feeding tube (since this is clearly the site of the infection). 4. Hypothyroid - converted synthroid to IV given her NPO status 5. NPO - CT abd/pelvis pending to help clarify extent and depth of infection, then will defer decision to day team wether to use her feeding tube for nutrition (despite surrounding infection) or more likely to put in a KO feeding tube temporarily. 6. Lactic acidosis - repeat lactate at noon ordered  Very ill patient, prognosis is guarded at best at this point, requires close monitoring in the SDU, could easily decompensate and require ICU status.  Has tracheostomy in place chronically so this can be used for rapid ventilator support if she decompensates.  Code Status: Full Code (despite poor baseline status and functioning)  Family Communication: No family present Disposition Plan: Admit to inpatient   Time spent: 37 min  Hillary Bow [facility] Pager [phone]  If 7AM-7PM, please contact the day team taking care of the patient [email or web address] Password [facility] 07/01/2013, 4:48 AM

## 2013-07-01 NOTE — ED Notes (Signed)
Notified RN of istat lactic acid of 4.27

## 2013-07-01 NOTE — Consult Note (Signed)
I have seen and examined the pt and agree with PA-Osborne's note Pt with large amount of anterior abdominal wall subcutaneous gas. Pt with PEG tube that is out of stomach laying in subcutaneous tissue. Will proceed emergently to OR for abd wall debridement, possible ex lap. I d/w the patient's husband that the patient may require  Multiple trips to the OR for debridement.   I also d/w her husband that she may not survive a surgery or this hospital stay.  He voiced understanding and wants everything done for her that is possible.

## 2013-07-01 NOTE — ED Notes (Signed)
MD Hyacinth Meeker informed of Lactic Acid level.

## 2013-07-01 NOTE — Consult Note (Signed)
[name] Dec 24, 1932  [id number].   Requesting MD: Dr. Verlee Monte Chief Complaint/Reason for Consult: abdominal wall infection HPI: This is a 80 yo white female with multiple medical problems who just had an extended hospital course due to CAP that resulted in a tach and PEG.  She also has complete heart block and is fully paced every beat.    She has been at [facility] and became more agitated over the last couple of days.  She may have unknowingly pulled her PEG out from her stomach into her subcutaneous space.  She has continued to get tube feeds into this space.  She has started to complain of abdominal pain and appeared flushed per her husband. Because of this, she was brought to the [facility] today where she was found to have a UTI and abdominal wall cellulitis.  She had a CT scan that revealed extensive air in her subcutaneous space along with a dislodged PEG tube.  She is becoming more and more lethargic as the day progresses.  We have been asked to see her for further recommendations  ROS : Patient unable to provide a ROS due to lethargy.  Family History  Problem Relation Age of Onset  . Diabetes Maternal Aunt   . Breast cancer Maternal Aunt   . Stroke Mother   . Stroke Father   . Hypothyroidism Sister   . Hypothyroidism Son   . Hypothyroidism Mother   . Hypertension Father   . Kidney disease Father     renal problems    Past Medical History  Diagnosis Date  . Hypothyroid   . Complete heart block 2002    Initial PPM 2002 with upgrade by JA to CRT-P 11/21/08  . Nonischemic cardiomyopathy   . Persistent atrial fibrillation   . Hypertension   . Hyperlipidemia   . Hives 4/10  . Cataract 4/13    right  . Renal failure     3rd stage-seeing neurologist  . Cardiac arrest     Past Surgical History  Procedure Laterality Date  . Pacemaker insertion  2002, 2010    PPM 2002, upgrade to CRT-P by Dr Rayann Heman (MDT 2010)  . Partial colectomy  1/04  . Abdominal hysterectomy   1987    TAH  . Bunionectomy Right 1996  . Breast biopsy Left 01/1997  . Cataract extraction  4/13    both eyes  . Cyst excision      on back of left ear    Social History:  reports that she has never smoked. She has never used smokeless tobacco. She reports that she does not drink alcohol or use illicit drugs.  Allergies:  Allergies  Allergen Reactions  . Beta Adrenergic Blockers Other (See Comments)    Not working well for pt.  Thayer Jew Hcl] Shortness Of Breath  . Carvedilol Shortness Of Breath    Chest  Discomfort.  . Dapsone Other (See Comments)    Kidney  failure  . Diclofenac Sodium Other (See Comments)    Causes  Fluid  Retention.  Chanda Busing [Pitavastatin Calcium] Shortness Of Breath  . Methotrexate Shortness Of Breath    Mouth sores.  . Metoprolol Succinate Hives  . Oxaprozin Diarrhea  . Other     Fragrance mixtures -  Cinol botanicals  &  Plants oil. Cocamidoproy   -   Betaine. Lipstick & other makeup & cleaning materials.   Marland Kitchen Epinephrine     Medications Prior to Admission  Medication Sig Dispense  Refill  . Amino Acids-Protein Hydrolys (FEEDING SUPPLEMENT, PRO-STAT SUGAR FREE 64,) LIQD Take 30 mLs by mouth 3 (three) times daily with meals.      . clonazePAM (KLONOPIN) 0.5 MG tablet Take one tablet by mouth every 8 hours for anxiety  90 tablet  5  . diphenoxylate-atropine (LOMOTIL) 2.5-0.025 MG per tablet Take one tablet by mouth four times daily as needed for diarrhea  120 tablet  0  . famotidine (PEPCID) 20 MG tablet Take 20 mg by mouth 2 (two) times daily.      . fentaNYL (DURAGESIC - DOSED MCG/HR) 30 MCG/HR Remove old patch and then apply one patch topically every 72 hours. Rotate sites  10 patch  0  . furosemide (LASIX) 40 MG tablet Take 40 mg by mouth 2 (two) times daily.      . insulin aspart (NOVOLOG) 100 UNIT/ML injection Inject 3 Units into the skin once. For CBG over 150      . insulin detemir (LEVEMIR) 100 UNIT/ML injection Inject 12 Units  into the skin at bedtime.      . lactobacillus (FLORANEX/LACTINEX) PACK Take 1 g by mouth 2 (two) times daily.      Marland Kitchen levothyroxine (SYNTHROID, LEVOTHROID) 100 MCG tablet Take 100 mcg by mouth daily before breakfast.      . LORazepam (ATIVAN) 1 MG tablet Take one tablet by mouth or per tube every 6 hours as needed for anxiety  120 tablet  5  . Nutritional Supplements (GLUCERNA 1.5 CAL PO) Take 1 Can by mouth 3 (three) times daily. If patient consumes less than 50% of meals      . potassium chloride SA (K-DUR,KLOR-CON) 20 MEQ tablet Take 20 mEq by mouth daily.      . sertraline (ZOLOFT) 25 MG tablet Take 75 mg by mouth daily.         Blood pressure 109/40, pulse 97, temperature 100.2 F (37.9 C), temperature source Oral, resp. rate 21, weight 144 lb 6.4 oz (65.5 kg), last menstrual period 03/03/1985, SpO2 95.00%. Physical Exam: General: lethargy ill-appearing white female who is laying in bed in NAD HEENT: head is normocephalic, atraumatic.  Eyes are closed, only opens to name.  Ears and nose without any masses or lesions.  Mouth is pink.  Neck reveals a tracheostomy Heart: completely paced.  Normal s1,s2. No obvious murmurs, gallops, or rubs noted.  Palpable radial and pedal pulses bilaterally Lungs: diffuse rhonchi.  Respiratory effort nonlabored on 28% O2 on trach collar Abd: diffuse upper abdominal cellulitis with significant induration and very tender, PEG tube present.  No erythema on inferior portion of abdomen.  No crepitus. +BS no masses, hernias, or organomegaly MS: all 4 extremities are symmetrical with no cyanosis, clubbing, or edema. Skin: warm and dry with no masses, lesions, or rashes Psych: lethargic, arouses to voice and name.    Results for orders placed during the hospital encounter of 07/01/13 (from the past 48 hour(s))  CBC WITH DIFFERENTIAL     Status: Abnormal   Collection Time    07/01/13  2:57 AM      Result Value Ref Range   WBC 18.9 (*) 4.0 - 10.5 K/uL   RBC  4.37  3.87 - 5.11 MIL/uL   Hemoglobin 13.0  12.0 - 15.0 g/dL   HCT 41.5  36.0 - 46.0 %   MCV 95.0  78.0 - 100.0 fL   MCH 29.7  26.0 - 34.0 pg   MCHC 31.3  30.0 - 36.0 g/dL  RDW 16.6 (*) 11.5 - 15.5 %   Platelets 270  150 - 400 K/uL   Neutrophils Relative % 86 (*) 43 - 77 %   Neutro Abs 16.1 (*) 1.7 - 7.7 K/uL   Lymphocytes Relative 9 (*) 12 - 46 %   Lymphs Abs 1.8  0.7 - 4.0 K/uL   Monocytes Relative 5  3 - 12 %   Monocytes Absolute 1.0  0.1 - 1.0 K/uL   Eosinophils Relative 0  0 - 5 %   Eosinophils Absolute 0.0  0.0 - 0.7 K/uL   Basophils Relative 0  0 - 1 %   Basophils Absolute 0.0  0.0 - 0.1 K/uL  COMPREHENSIVE METABOLIC PANEL     Status: Abnormal   Collection Time    07/01/13  2:57 AM      Result Value Ref Range   Sodium 142  137 - 147 mEq/L   Potassium 4.1  3.7 - 5.3 mEq/L   Chloride 97  96 - 112 mEq/L   CO2 27  19 - 32 mEq/L   Glucose, Bld 194 (*) 70 - 99 mg/dL   BUN 41 (*) 6 - 23 mg/dL   Creatinine, Ser 1.13 (*) 0.50 - 1.10 mg/dL   Calcium 9.6  8.4 - 10.5 mg/dL   Total Protein 7.0  6.0 - 8.3 g/dL   Albumin 2.4 (*) 3.5 - 5.2 g/dL   AST 24  0 - 37 U/L   ALT 20  0 - 35 U/L   Alkaline Phosphatase 89  39 - 117 U/L   Total Bilirubin 0.5  0.3 - 1.2 mg/dL   GFR calc non Af Amer 45 (*) >90 mL/min   GFR calc Af Amer 52 (*) >90 mL/min   Comment: (NOTE)     The eGFR has been calculated using the CKD EPI equation.     This calculation has not been validated in all clinical situations.     eGFR's persistently <90 mL/min signify possible Chronic Kidney     Disease.  I-STAT CHEM 8, ED     Status: Abnormal   Collection Time    07/01/13  3:14 AM      Result Value Ref Range   Sodium 141  137 - 147 mEq/L   Potassium 4.2  3.7 - 5.3 mEq/L   Chloride 99  96 - 112 mEq/L   BUN 45 (*) 6 - 23 mg/dL   Creatinine, Ser 1.30 (*) 0.50 - 1.10 mg/dL   Glucose, Bld 192 (*) 70 - 99 mg/dL   Calcium, Ion 1.07 (*) 1.13 - 1.30 mmol/L   TCO2 34  0 - 100 mmol/L   Hemoglobin 16.0 (*) 12.0 - 15.0  g/dL   HCT 47.0 (*) 36.0 - 46.0 %  I-STAT CG4 LACTIC ACID, ED     Status: Abnormal   Collection Time    07/01/13  3:15 AM      Result Value Ref Range   Lactic Acid, Venous 4.27 (*) 0.5 - 2.2 mmol/L  URINALYSIS, ROUTINE W REFLEX MICROSCOPIC     Status: Abnormal   Collection Time    07/01/13  3:28 AM      Result Value Ref Range   Color, Urine YELLOW  YELLOW   APPearance TURBID (*) CLEAR   Specific Gravity, Urine 1.021  1.005 - 1.030   pH 6.5  5.0 - 8.0   Glucose, UA NEGATIVE  NEGATIVE mg/dL   Hgb urine dipstick LARGE (*) NEGATIVE   Bilirubin Urine NEGATIVE  NEGATIVE   Ketones, ur NEGATIVE  NEGATIVE mg/dL   Protein, ur 100 (*) NEGATIVE mg/dL   Urobilinogen, UA 1.0  0.0 - 1.0 mg/dL   Nitrite NEGATIVE  NEGATIVE   Leukocytes, UA LARGE (*) NEGATIVE  URINE MICROSCOPIC-ADD ON     Status: Abnormal   Collection Time    07/01/13  3:28 AM      Result Value Ref Range   Squamous Epithelial / LPF RARE  RARE   WBC, UA 11-20  <3 WBC/hpf   RBC / HPF 7-10  <3 RBC/hpf   Bacteria, UA MANY (*) RARE   Crystals CA OXALATE CRYSTALS (*) NEGATIVE  LACTIC ACID, PLASMA     Status: Abnormal   Collection Time    07/01/13  5:41 AM      Result Value Ref Range   Lactic Acid, Venous 2.9 (*) 0.5 - 2.2 mmol/L  CBG MONITORING, ED     Status: Abnormal   Collection Time    07/01/13  6:10 AM      Result Value Ref Range   Glucose-Capillary 160 (*) 70 - 99 mg/dL  MRSA PCR SCREENING     Status: None   Collection Time    07/01/13  6:39 AM      Result Value Ref Range   MRSA by PCR NEGATIVE  NEGATIVE   Comment:            The GeneXpert MRSA Assay (FDA     approved for NASAL specimens     only), is one component of a     comprehensive MRSA colonization     surveillance program. It is not     intended to diagnose MRSA     infection nor to guide or     monitor treatment for     MRSA infections.  GLUCOSE, CAPILLARY     Status: Abnormal   Collection Time    07/01/13  6:44 AM      Result Value Ref Range    Glucose-Capillary 151 (*) 70 - 99 mg/dL  MRSA PCR SCREENING     Status: None   Collection Time    07/01/13  8:48 AM      Result Value Ref Range   MRSA by PCR NEGATIVE  NEGATIVE   Comment:            The GeneXpert MRSA Assay (FDA     approved for NASAL specimens     only), is one component of a     comprehensive MRSA colonization     surveillance program. It is not     intended to diagnose MRSA     infection nor to guide or     monitor treatment for     MRSA infections.  GLUCOSE, CAPILLARY     Status: Abnormal   Collection Time    07/01/13 11:51 AM      Result Value Ref Range   Glucose-Capillary 109 (*) 70 - 99 mg/dL   Ct Abdomen Pelvis W Contrast  07/01/2013   CLINICAL DATA:  Abdominal wall infection question deep tissue infection, abdominal pain, fever, history non ischemic cardiomyopathy, atrial fibrillation, hypertension ; had a retroperitoneal hematoma in March 2015  EXAM: CT ABDOMEN AND PELVIS WITH CONTRAST  TECHNIQUE: Multidetector CT imaging of the abdomen and pelvis was performed using the standard protocol following bolus administration of intravenous contrast. Sagittal and coronal MPR images reconstructed from axial data set.  CONTRAST:  55mL OMNIPAQUE IOHEXOL 300 MG/ML SOLN IV. Dilute oral  contrast.  COMPARISON:  05/12/2013  FINDINGS: Small bibasilar pleural effusions and atelectasis.  Focus of infiltrate centrally in RIGHT lower lobe.  Pacemaker leads RIGHT atrium, RIGHT ventricle and coronary sinus.  Enlargement of cardiac chambers.  Distended gallbladder with dependent density which could represent milk-of-calcium or tiny layering stones.  No definite gallbladder wall thickening or pericholecystic edema by CT.  Liver, spleen, and pancreas normal appearance.  Beam hardening artifacts from patient's RIGHT arm.  No definite focal renal abnormalities within limitations of artifacts.  LEFT adrenal mass with indeterminate attenuation 2.3 x 2.1 cm image 26 previously 2.2 x 1.8 cm by my  re-measurement.  Extensive subcutaneous gas and edema throughout the anterior abdominal wall from the subxiphoid region to the umbilicus compatible with soft tissue infection by a gas-forming organism.  Bumper of a gastrostomy tube is located external to the anterior abdominal wall fascia external to the stomach and abdominal cavity.  Observed anterior abdominal wall gas extends down to the external oblique fascia without definite extension into the abdominal wall muscular planes.  No intra-abdominal extension of gas is identified.  Significant interval decrease in size of previously seen LEFT iliopsoas hematoma.  Area of low attenuation within LEFT psoas 2.2 x 1.9 x ~4.9 cm may represent a liquified hematoma.  No foci of gas are seen within the question to suggest infection, though infection cannot be excluded by CT.  Foley catheter decompresses urinary bladder.  Small LEFT ovarian cysts.  Stomach and bowel loops normal appearance.  No additional mass, adenopathy, free fluid, or free air.  Atherosclerotic calcifications without aneurysm.  Degenerative disc disease changes at L4-L5 and L3-L4 with osseous demineralization noted.  IMPRESSION: Extensive gas in subcutaneous edema within the anterior abdominal wall compatible with infection by gas-forming organism.  Bumper of gastrostomy tube is located external to the stomach, superficial to the anterior abdominal wall fascia within the subcutaneous fat, recommend removal and replacement.  Interval decrease in size of LEFT iliopsoas hematoma since previous exam, now containing an area of central low attenuation likely a liquified hematoma though infection is never excluded by CT.  Cholelithiasis questioned.  LEFT adrenal mass again identified.  Minimal RIGHT lower lobe infiltrate.  Findings called to Dr. Marolyn Haller on 07/01/2013 at 1234 hours.   Electronically Signed   By: Ulyses Southward M.D.   On: 07/01/2013 12:46   Dg Chest Port 1 View  07/01/2013   CLINICAL DATA:  Fever  and left upper quadrant abdominal pain.  EXAM: PORTABLE CHEST - 1 VIEW  COMPARISON:  Chest radiograph performed 06/12/2013  FINDINGS: The patient's tracheostomy tube is seen ending 5-6 cm above the carina.  The lungs are relatively well expanded. Mild basilar airspace opacities may reflect atelectasis or mild pneumonia. This is similar in appearance to the prior study. No pleural effusion or pneumothorax is seen.  The cardiomediastinal silhouette is borderline normal in size. The pacemaker is seen overlying the left chest wall, with leads ending overlying the right atrium, right ventricle and coronary sinus. No acute osseous abnormalities are seen.  IMPRESSION: 1. Tracheostomy tube seen ending 5-6 cm above the carina. 2. Mild bibasilar airspace opacities may reflect atelectasis or mild pneumonia.   Electronically Signed   By: Roanna Raider M.D.   On: 07/01/2013 06:33       Assessment/Plan 1. Extensive abdominal wall cellulitis secondary to leaked tube feeds Patient Active Problem List   Diagnosis Date Noted  . Severe sepsis 07/01/2013  . Abdominal wall cellulitis 07/01/2013  . Lactic  acidosis 07/01/2013  . PEG (percutaneous endoscopic gastrostomy) status 06/15/2013  . Pneumonia due to Pseudomonas 06/15/2013  . Cardiac pacemaker in situ 06/15/2013  . DVT of upper extremity (deep vein thrombosis) 05/05/2013  . Hemorrhagic shock 05/02/2013  . Tracheostomy status 04/27/2013  . Acute on chronic respiratory failure 04/25/2013  . Acute delirium-ICU 04/13/2013  . Palliative care encounter 04/13/2013  . Long term current use of amiodarone 04/13/2013  . Respiratory failure 04/05/2013  . CAP (community acquired pneumonia) 04/05/2013  . Acute on chronic renal failure 04/05/2013  . Viral pneumonia 04/05/2013  . Hypotension 04/05/2013  . Combined systolic and diastolic heart failure 23/34/3568  . Essential hypertension, benign 03/23/2009  . ATRIOVENTRICULAR BLOCK, COMPLETE 11/09/2008  . Atrial  fibrillation 11/09/2008  . Unspecified hypothyroidism 11/08/2008  . OTHER PRIMARY CARDIOMYOPATHIES 11/08/2008  . UNSPECIFIED SYSTOLIC HEART FAILURE 61/68/3729  . SYNCOPE 11/08/2008   Plan: 1. We will plan emergent operative intervention for debridement of her abdominal wall.  I have contact Dr. Hartford Poli as well as CCM and let them know that the patient will come back from the operating room on the ventilator.  The patient will likely be critically ill and given her multitude of medical problems and recent hospitalization, i doubt her reserve to handle much will be great. I anticipate her outcome may be poor.  She may require multiple trips to the OR.  We have extensively discussed all of this with her husband.  He understands and wishes for [place] to do everything we can to help his wife.    Henreitta Cea 07/01/2013, 2:26 PM Pager: [phone]

## 2013-07-01 NOTE — ED Notes (Addendum)
Pt to ED via EMS from [facility] with c/o abdominal pain and fever, pt warm to touch, redness noted at LUQ. EMS reports temp.-100.3, BP-96/60. Pt given ativan at the facility.

## 2013-07-01 NOTE — Consult Note (Signed)
PULMONARY / CRITICAL CARE MEDICINE   Name: [name] MRN: [id number] DOB: 07/29/1932    ADMISSION DATE:  07/01/2013 CONSULTATION DATE:  5./1/15  REFERRING MD :  Dr Derrell Lollingamirez PRIMARY SERVICE: [facility] >> PCCM  CHIEF COMPLAINT:  Abdominal pain REASON FOR CONSULTATION: Vent Management, Severe Sepsis  BRIEF PATIENT DESCRIPTION:  80 yo chronically ill woman with complicated [facility], [facility] and [facility]  She is s/p Trach and PEG in aftermath of gastroenteritis + ARDS, retroperitoneal bleed, pneumothorax; then PNA (with MDR pseudomonas), septic and hemorrhagic shock. She was weaned to ATC at [facility] and was discharged to SNF for further care. Her other chronic medical problems > Heart block s/p pacer placement (100% paced), hypothyroidism, DM, A Fib, CKD. She was admitted with fever, abdominal pain and erythema of her abdominal wall 5/1. Was found to have subcutaneous tissue infection due to malpositioned PEG requiring significant debridement 5/1. She returned to ICU on MV, hemodynamically stable.   SIGNIFICANT EVENTS / STUDIES:  CT abd/pelvis 5/1 >> extensive gas in sq tissue consistent with infection, PEG tube out of stomach and into the subcutaneous fat, improved iliopsoas hematoma, possible cholelithiasis, L adrenal mass, small RLL infiltrate  LINES / TUBES: #4 cuffed Shiley 5/1 >>  New foley 5/1 >>  Penrose drains x 3, 5/1 >>   CULTURES: Prior admission resp cx >> MDR pseudomonas 5/1 resp cx >>  5/1 blood cx >>  5/1 urine cx >>  5/1 MRSA screen >> negative  ANTIBIOTICS: Vancomycin 5/1 >>  Pip/tazo 5/1 >>   PAST MEDICAL HISTORY :  Past Medical History  Diagnosis Date  . Hypothyroid   . Complete heart block 2002    Initial PPM 2002 with upgrade by JA to CRT-P 11/21/08  . Nonischemic cardiomyopathy   . Persistent atrial fibrillation   . Hypertension   . Hyperlipidemia   . Hives 4/10  . Cataract 4/13    right  . Renal  failure     3rd stage-seeing neurologist  . Cardiac arrest    Past Surgical History  Procedure Laterality Date  . Pacemaker insertion  2002, 2010    PPM 2002, upgrade to CRT-P by Dr Johney FrameAllred (MDT 2010)  . Partial colectomy  1/04  . Abdominal hysterectomy  1987    TAH  . Bunionectomy Right 1996  . Breast biopsy Left 01/1997  . Cataract extraction  4/13    both eyes  . Cyst excision      on back of left ear   Prior to Admission medications   Medication Sig Start Date End Date Taking? Authorizing Provider  Amino Acids-Protein Hydrolys (FEEDING SUPPLEMENT, PRO-STAT SUGAR FREE 64,) LIQD Take 30 mLs by mouth 3 (three) times daily with meals.   Yes Historical Provider, MD  clonazePAM (KLONOPIN) 0.5 MG tablet Take one tablet by mouth every 8 hours for anxiety 06/22/13  Yes Kimber RelicArthur G Green, MD  diphenoxylate-atropine (LOMOTIL) 2.5-0.025 MG per tablet Take one tablet by mouth four times daily as needed for diarrhea 06/22/13  Yes Kimber RelicArthur G Green, MD  famotidine (PEPCID) 20 MG tablet Take 20 mg by mouth 2 (two) times daily.   Yes Historical Provider, MD  fentaNYL (DURAGESIC - DOSED MCG/HR) 50 MCG/HR Remove old patch and then apply one patch topically every 72 hours. Rotate sites 06/20/13  Yes Tiffany L Reed, DO  furosemide (LASIX) 40 MG tablet Take 40 mg by mouth 2 (two) times daily.   Yes  Historical Provider, MD  insulin aspart (NOVOLOG) 100 UNIT/ML injection Inject 3 Units into the skin once. For CBG over 150   Yes Historical Provider, MD  insulin detemir (LEVEMIR) 100 UNIT/ML injection Inject 12 Units into the skin at bedtime.   Yes Historical Provider, MD  lactobacillus (FLORANEX/LACTINEX) PACK Take 1 g by mouth 2 (two) times daily.   Yes Historical Provider, MD  levothyroxine (SYNTHROID, LEVOTHROID) 100 MCG tablet Take 100 mcg by mouth daily before breakfast.   Yes Historical Provider, MD  LORazepam (ATIVAN) 1 MG tablet Take one tablet by mouth or per tube every 6 hours as needed for anxiety 06/27/13   Yes Tiffany L Reed, DO  Nutritional Supplements (GLUCERNA 1.5 CAL PO) Take 1 Can by mouth 3 (three) times daily. If patient consumes less than 50% of meals   Yes Historical Provider, MD  potassium chloride SA (K-DUR,KLOR-CON) 20 MEQ tablet Take 20 mEq by mouth daily.   Yes Historical Provider, MD  sertraline (ZOLOFT) 25 MG tablet Take 75 mg by mouth daily.    Yes Historical Provider, MD   Allergies  Allergen Reactions  . Beta Adrenergic Blockers Other (See Comments)    Not working well for pt.  Sherrie Mustache Hcl] Shortness Of Breath  . Carvedilol Shortness Of Breath    Chest  Discomfort.  . Dapsone Other (See Comments)    Kidney  failure  . Diclofenac Sodium Other (See Comments)    Causes  Fluid  Retention.  Consuelo Pandy [Pitavastatin Calcium] Shortness Of Breath  . Methotrexate Shortness Of Breath    Mouth sores.  . Metoprolol Succinate Hives  . Oxaprozin Diarrhea  . Other     Fragrance mixtures -  Cinol botanicals  &  Plants oil. Cocamidoproy   -   Betaine. Lipstick & other makeup & cleaning materials.   Marland Kitchen Epinephrine     FAMILY HISTORY:  Family History  Problem Relation Age of Onset  . Diabetes Maternal Aunt   . Breast cancer Maternal Aunt   . Stroke Mother   . Stroke Father   . Hypothyroidism Sister   . Hypothyroidism Son   . Hypothyroidism Mother   . Hypertension Father   . Kidney disease Father     renal problems   SOCIAL HISTORY:  reports that she has never smoked. She has never used smokeless tobacco. She reports that she does not drink alcohol or use illicit drugs.  REVIEW OF SYSTEMS:   Unable to obtain full ROS as pt cannot speak with me  SUBJECTIVE:  C/o abd pain, especially with light palpation Currently stable on MV, normotensive  VITAL SIGNS: Temp:  [99.3 F (37.4 C)-100.2 F (37.9 C)] 100.2 F (37.9 C) (05/01 1108) Pulse Rate:  [85-109] 94 (05/01 1800) Resp:  [17-29] 21 (05/01 1730) BP: (99-160)/(35-102) 143/41 mmHg (05/01 1800) SpO2:   [86 %-100 %] 100 % (05/01 1800) FiO2 (%):  [28 %-40 %] 40 % (05/01 1720) Weight:  [65.5 kg (144 lb 6.4 oz)] 65.5 kg (144 lb 6.4 oz) (05/01 1108) HEMODYNAMICS:   VENTILATOR SETTINGS: Vent Mode:  [-] PRVC FiO2 (%):  [28 %-40 %] 40 % Set Rate:  [14 bmp] 14 bmp Vt Set:  [420 mL] 420 mL PEEP:  [5 cmH20] 5 cmH20 INTAKE / OUTPUT: Intake/Output     04/30 0701 - 05/01 0700 05/01 0701 - 05/02 0700   I.V. (mL/kg) 125 1125 (17.2)   Other  330   IV Piggyback  200   Total Intake(mL/kg)  125 1655 (25.3)   Urine (mL/kg/hr)  700 (0.9)   Total Output   700   Net +125 +955        Stool Occurrence  1 x     PHYSICAL EXAMINATION: General:  Debilitated woman, awake but poorly interactive, on MV Neuro:  Eyes open, tries to mouth words, moves her UE's, follows some commands HEENT:  OP clear, pupils 5-46mm and sluggish, trach site clean  Cardiovascular:  Regular, distant, 100's, paced Lungs:  Clear bilaterally Abdomen:  Large ventral dressing with some serous drainage onto gauze and sheet, no BS heard, tender to touch Musculoskeletal:  No deformities No rash  LABS:  CBC  Recent Labs Lab 07/01/13 0257 07/01/13 0314  WBC 18.9*  --   HGB 13.0 16.0*  HCT 41.5 47.0*  PLT 270  --    Coag's  Recent Labs Lab 07/01/13 1424  INR 1.45   BMET  Recent Labs Lab 07/01/13 0257 07/01/13 0314  NA 142 141  K 4.1 4.2  CL 97 99  CO2 27  --   BUN 41* 45*  CREATININE 1.13* 1.30*  GLUCOSE 194* 192*   Electrolytes  Recent Labs Lab 07/01/13 0257  CALCIUM 9.6   Sepsis Markers  Recent Labs Lab 07/01/13 0315 07/01/13 0541  LATICACIDVEN 4.27* 2.9*   ABG No results found for this basename: PHART, PCO2ART, PO2ART,  in the last 168 hours Liver Enzymes  Recent Labs Lab 07/01/13 0257  AST 24  ALT 20  ALKPHOS 89  BILITOT 0.5  ALBUMIN 2.4*   Cardiac Enzymes No results found for this basename: TROPONINI, PROBNP,  in the last 168 hours Glucose  Recent Labs Lab 07/01/13 0610  07/01/13 0644 07/01/13 1151  GLUCAP 160* 151* 109*    Imaging Dg Abd 1 View  07/01/2013   CLINICAL DATA:  G-tube placement.  EXAM: ABDOMEN - 1 VIEW; DG C-ARM 1-60 MIN  COMPARISON:  None.  FINDINGS: Multiple C arm views demonstrate contrast injection through the gastrostomy tube with the contrast in the stomach and extending into the duodenum.  IMPRESSION: G-tube in the stomach.   Electronically Signed   By: Gordan Payment M.D.   On: 07/01/2013 17:20   Ct Abdomen Pelvis W Contrast  07/01/2013   CLINICAL DATA:  Abdominal wall infection question deep tissue infection, abdominal pain, fever, history non ischemic cardiomyopathy, atrial fibrillation, hypertension ; had a retroperitoneal hematoma in March 2015  EXAM: CT ABDOMEN AND PELVIS WITH CONTRAST  TECHNIQUE: Multidetector CT imaging of the abdomen and pelvis was performed using the standard protocol following bolus administration of intravenous contrast. Sagittal and coronal MPR images reconstructed from axial data set.  CONTRAST:  58mL OMNIPAQUE IOHEXOL 300 MG/ML SOLN IV. Dilute oral contrast.  COMPARISON:  05/12/2013  FINDINGS: Small bibasilar pleural effusions and atelectasis.  Focus of infiltrate centrally in RIGHT lower lobe.  Pacemaker leads RIGHT atrium, RIGHT ventricle and coronary sinus.  Enlargement of cardiac chambers.  Distended gallbladder with dependent density which could represent milk-of-calcium or tiny layering stones.  No definite gallbladder wall thickening or pericholecystic edema by CT.  Liver, spleen, and pancreas normal appearance.  Beam hardening artifacts from patient's RIGHT arm.  No definite focal renal abnormalities within limitations of artifacts.  LEFT adrenal mass with indeterminate attenuation 2.3 x 2.1 cm image 26 previously 2.2 x 1.8 cm by my re-measurement.  Extensive subcutaneous gas and edema throughout the anterior abdominal wall from the subxiphoid region to the umbilicus compatible with soft tissue infection by  a  gas-forming organism.  Bumper of a gastrostomy tube is located external to the anterior abdominal wall fascia external to the stomach and abdominal cavity.  Observed anterior abdominal wall gas extends down to the external oblique fascia without definite extension into the abdominal wall muscular planes.  No intra-abdominal extension of gas is identified.  Significant interval decrease in size of previously seen LEFT iliopsoas hematoma.  Area of low attenuation within LEFT psoas 2.2 x 1.9 x ~4.9 cm may represent a liquified hematoma.  No foci of gas are seen within the question to suggest infection, though infection cannot be excluded by CT.  Foley catheter decompresses urinary bladder.  Small LEFT ovarian cysts.  Stomach and bowel loops normal appearance.  No additional mass, adenopathy, free fluid, or free air.  Atherosclerotic calcifications without aneurysm.  Degenerative disc disease changes at L4-L5 and L3-L4 with osseous demineralization noted.  IMPRESSION: Extensive gas in subcutaneous edema within the anterior abdominal wall compatible with infection by gas-forming organism.  Bumper of gastrostomy tube is located external to the stomach, superficial to the anterior abdominal wall fascia within the subcutaneous fat, recommend removal and replacement.  Interval decrease in size of LEFT iliopsoas hematoma since previous exam, now containing an area of central low attenuation likely a liquified hematoma though infection is never excluded by CT.  Cholelithiasis questioned.  LEFT adrenal mass again identified.  Minimal RIGHT lower lobe infiltrate.  Findings called to Dr. Marolyn Haller on 07/01/2013 at 1234 hours.   Electronically Signed   By: Ulyses Southward M.D.   On: 07/01/2013 12:46   Dg Chest Port 1 View  07/01/2013   CLINICAL DATA:  Fever and left upper quadrant abdominal pain.  EXAM: PORTABLE CHEST - 1 VIEW  COMPARISON:  Chest radiograph performed 06/12/2013  FINDINGS: The patient's tracheostomy tube is seen ending  5-6 cm above the carina.  The lungs are relatively well expanded. Mild basilar airspace opacities may reflect atelectasis or mild pneumonia. This is similar in appearance to the prior study. No pleural effusion or pneumothorax is seen.  The cardiomediastinal silhouette is borderline normal in size. The pacemaker is seen overlying the left chest wall, with leads ending overlying the right atrium, right ventricle and coronary sinus. No acute osseous abnormalities are seen.  IMPRESSION: 1. Tracheostomy tube seen ending 5-6 cm above the carina. 2. Mild bibasilar airspace opacities may reflect atelectasis or mild pneumonia.   Electronically Signed   By: Roanna Raider M.D.   On: 07/01/2013 06:33   Dg C-arm 1-60 Min  07/01/2013   CLINICAL DATA:  G-tube placement.  EXAM: ABDOMEN - 1 VIEW; DG C-ARM 1-60 MIN  COMPARISON:  None.  FINDINGS: Multiple C arm views demonstrate contrast injection through the gastrostomy tube with the contrast in the stomach and extending into the duodenum.  IMPRESSION: G-tube in the stomach.   Electronically Signed   By: Gordan Payment M.D.   On: 07/01/2013 17:20     ASSESSMENT / PLAN:  PULMONARY A: Acute on chronic respiratory failure, now VDRF post-operatively Tracheostomy  Hx HCAP 2-05/2013 Hx pneumothorax 2-05/2013 P:   - PRVC 500 x 14, 0.40 + 5 PEEP - check ABG at 20:00 and then adjust vent settings - plan for MV through the night, then reassess for ATC on 5/2. Her pain, narcotics requirement and any need to return to the OR will determine how long she needs to be ventilated.   CARDIOVASCULAR A: Atrial Fib Complete heart block s/p pacer Hx cardiac arrest 2-05/2013  due to mucous plugging P:  - no anticoagulation at this time (this had been deferred after her retroperitoneal bleed)  - follow telemetry - IVF at 75cc/h to keep up with wound losses  RENAL A:  Acute renal insufficiency (S Cr 0.78 on 06/12/13), likely due to sepsis P:   - follow BMP with resuscitation    GASTROINTESTINAL A:  Malpositioned PEG Severe ventral subcutaneous infection s/p debridement Acid prophylaxis Malnutrition  P:   - may require further debridement in OR - significant drainage from penrose drain - start protonix - PEG was replaced; will not use until OK with CCS - ? Whether she will be able to have enteral feeds vs TPN  HEMATOLOGIC A:  Hx of retroperitoneal bleed 2-05/2013 - CT scan 5/1 shows the clot is smaller, has an area of hypodensity P:  - follow CBC  INFECTIOUS A:  Severe Sepsis, at risk for septic shock Subcutaneous abdominal tissue infection, debrided 5/1 UTI with chronic foley P:   - Cultures pending - empiric zosyn and vanco started 5/1 - foley changed on admission - follow lactate for clearance  ENDOCRINE A:  DM hypothyroidism   P:   - synthroid IV - start ICU HG protocol  NEUROLOGIC A:  Hx anoxic injury with decreased neuro capacity  Pain management Hx chronic clonazepam use P:   - agree with fentanyl gtt post-op, will try to transition to intermittent dosing on 5/2 - will leave existing fentanyl patch in place - prn ativan available   I have personally obtained a history, examined the patient, evaluated laboratory and imaging results, formulated the assessment and plan and placed orders. CRITICAL CARE: The patient is critically ill with multiple organ systems failure and requires high complexity decision making for assessment and support, frequent evaluation and titration of therapies, application of advanced monitoring technologies and extensive interpretation of multiple databases. Critical Care Time devoted to patient care services described in this note is 60 minutes.    [name], MD, PhD 07/01/2013, 7:23 PM [facility] [phone] or if no answer [phone]

## 2013-07-01 NOTE — Op Note (Signed)
07/01/2013  4:42 PM  PATIENT:  [name]  80 y.o. female  PRE-OPERATIVE DIAGNOSIS:  Subcutaneous air, dislodged feeding tube POST-OPERATIVE DIAGNOSIS:  same  PROCEDURE:  Procedure(s): DEBRIDEMENT AND IRRIGATION OF ABDOMINAL WALL  PLACEMENT OF GASTRIC FEEDING TUBE SUBCUTANEOUS DRAIN PLACEMENT (N/A)  SURGEON:  Surgeon(s) and Role:    * Axel Filler, MD - Primary   ASSISTANTS: none   ANESTHESIA:   general  EBL:  Total I/O In: 992.5 [I.V.:625; Other:330; IV Piggyback:37.5] Out: 400 [Urine:400]  BLOOD ADMINISTERED:none  DRAINS: Penrose drain in the SUBCUTANEOUS FAT X3- BILATERALLY   LOCAL MEDICATIONS USED:  NONE  SPECIMEN:  No Specimen  DISPOSITION OF SPECIMEN:  N/A  COUNTS:  YES  TOURNIQUET:  * No tourniquets in log *  DICTATION: .Dragon Dictation The patient was taken back to the OR in critical condition with bilateral SCDs in place.  After appropriate antibiotics were confirmed a time out was called and all facts were verified.    An incision was made over the area of greatest erytheyma which was just cephalad to the PEG tube.  Electrocautery was used to maintain hemostasis.  Blunt dissection was taken down to the anterior fascia.  I did encounter some tube feeds within the sub-q space.  The sub-q fat was easily dissection under the area of the skin erythema.  This extended to the right abdominal wall and left abdominal wall.  I dissected down to the mushroom of the gastric tube which lay within the sub-q fat.  It lay just over the previous exit site.  A kelly clamp easily cannulated the tract into the stomach.  At this time a 24Fr malencott tube was placed into the tract.  Gastric contents were aspirated.  This was confirmed to be in the stomach with contrast under fluoroscopy.    The sub-q space was washed out copiuously with sterile saline. 3/4 inch Penrose drains x 3 were placed into the sub-q fat bilaterally.  The malencott tube was brought out a separate stab  incision.  All drain and feeding tube was secured using a 2-0 nylon.  The wound was packed with iodoform soaked kerlix x 1.  The paitent tolerated the procedure well and was taken to the ICU in critical condition.  PLAN OF CARE: Admit to inpatient   PATIENT DISPOSITION:  ICU - intubated and critically ill.   Delay start of Pharmacological VTE agent (>24hrs) due to surgical blood loss or risk of bleeding: not applicable

## 2013-07-01 NOTE — Progress Notes (Signed)
[facility] PROGRESS NOTE   [name] WUJ:[id number] DOB: Aug 20, 1932 DOA: 07/01/2013 PCP: [name], MD  HPI/Subjective: Seen with husband at bedside, confirm that she is full code. Patient follows simple commands although she is lethargic.  Assessment/Plan: Principal Problem:   Severe sepsis Active Problems:   Unspecified hypothyroidism   Tracheostomy status   PEG (percutaneous endoscopic gastrostomy) status   Abdominal wall cellulitis   Lactic acidosis    Septic shock -Patient presented with a respiratory rate of 29, heart rate of 125 and pressure of 99/40. -This is likely secondary to abdominal wall cellulitis. -Blood pressure responded to IV fluids. Spoke to CCM, they will jump in if she decompensates.  Abdominal wall cellulitis -CT showed no evidence of abscess but subcutaneous emphysema consistent with gas forming bacterial infection. -Patient is on Zosyn and vancomycin. -The abdominal wall cellulitis likely related to her PEG tube which is mid way out. -General surgery consulted, continue antibiotics.  UTI -Urinalysis consistent with UTI, patient is on Zosyn. -Adjust antibiotics according to culture results.  Status post PEG tube -CT scan showed displaced PEG tube, confirmed with radiology placement was okay on the 3/20. -Likely accidentally displaced, general surgery to advise about leaving and are taken out. -No tube feeding for now.  Lactic acidosis -Likely secondary to the septic shock, antibiotics be continued as well as IV fluids. -Check lactate and a.m.  History of MDR Pseudomonas -Patient has history of MDR Pseudomonas pneumonia, currently has bibasilar infiltrates. -Patient is on Zosyn, he developed fever she might needs double coverage for Pseudomonas.  Code Status: Full Code Family Communication: Plan discussed with the patient. Disposition Plan: Remains inpatient   Consultants:  Gen  Surgery  Procedures:  None  Antibiotics: Anti-infectives   Start     Dose/Rate Route Frequency Ordered Stop   07/01/13 1500  vancomycin (VANCOCIN) IVPB 750 mg/150 ml premix     750 mg 150 mL/hr over 60 Minutes Intravenous Every 12 hours 07/01/13 0508     07/01/13 1000  piperacillin-tazobactam (ZOSYN) IVPB 3.375 g     3.375 g 12.5 mL/hr over 240 Minutes Intravenous 3 times per day 07/01/13 0508     07/01/13 0300  piperacillin-tazobactam (ZOSYN) IVPB 3.375 g     3.375 g 100 mL/hr over 30 Minutes Intravenous  Once 07/01/13 0258 07/01/13 0355   07/01/13 0300  vancomycin (VANCOCIN) IVPB 1000 mg/200 mL premix     1,000 mg 200 mL/hr over 60 Minutes Intravenous  Once 07/01/13 0258 07/01/13 0413       Objective: Filed Vitals:   07/01/13 1230  BP: 109/40  Pulse: 97  Temp:   Resp: 21    Intake/Output Summary (Last 24 hours) at 07/01/13 1319 Last data filed at 07/01/13 1200  Gross per 24 hour  Intake 1117.5 ml  Output    400 ml  Net  717.5 ml   Filed Weights   07/01/13 1108  Weight: 65.5 kg (144 lb 6.4 oz)    Exam: General: Alert and awake, oriented x3, not in any acute distress. HEENT: anicteric sclera, pupils reactive to light and accommodation, EOMI CVS: S1-S2 clear, no murmur rubs or gallops Chest: clear to auscultation bilaterally, no wheezing, rales or rhonchi Abdomen: soft nontender, nondistended, normal bowel sounds, no organomegaly Extremities: no cyanosis, clubbing or edema noted bilaterally Neuro: Cranial nerves II-XII intact, no focal neurological deficits  Data Reviewed: Basic Metabolic Panel:  Recent Labs Lab 07/01/13 0257 07/01/13 0314  NA 142 141  K 4.1 4.2  CL 97 99  CO2 27  --   GLUCOSE 194* 192*  BUN 41* 45*  CREATININE 1.13* 1.30*  CALCIUM 9.6  --    Liver Function Tests:  Recent Labs Lab 07/01/13 0257  AST 24  ALT 20  ALKPHOS 89  BILITOT 0.5  PROT 7.0  ALBUMIN 2.4*   No results found for this basename: LIPASE, AMYLASE,  in  the last 168 hours No results found for this basename: AMMONIA,  in the last 168 hours CBC:  Recent Labs Lab 07/01/13 0257 07/01/13 0314  WBC 18.9*  --   NEUTROABS 16.1*  --   HGB 13.0 16.0*  HCT 41.5 47.0*  MCV 95.0  --   PLT 270  --    Cardiac Enzymes: No results found for this basename: CKTOTAL, CKMB, CKMBINDEX, TROPONINI,  in the last 168 hours BNP (last 3 results)  Recent Labs  04/13/13 0447 04/15/13 0410 04/16/13 0430  PROBNP 2694.0* 2271.0* 3345.0*   CBG:  Recent Labs Lab 07/01/13 0610 07/01/13 0644 07/01/13 1151  GLUCAP 160* 151* 109*    Micro Recent Results (from the past 240 hour(s))  MRSA PCR SCREENING     Status: None   Collection Time    07/01/13  6:39 AM      Result Value Ref Range Status   MRSA by PCR NEGATIVE  NEGATIVE Final   Comment:            The GeneXpert MRSA Assay (FDA     approved for NASAL specimens     only), is one component of a     comprehensive MRSA colonization     surveillance program. It is not     intended to diagnose MRSA     infection nor to guide or     monitor treatment for     MRSA infections.  MRSA PCR SCREENING     Status: None   Collection Time    07/01/13  8:48 AM      Result Value Ref Range Status   MRSA by PCR NEGATIVE  NEGATIVE Final   Comment:            The GeneXpert MRSA Assay (FDA     approved for NASAL specimens     only), is one component of a     comprehensive MRSA colonization     surveillance program. It is not     intended to diagnose MRSA     infection nor to guide or     monitor treatment for     MRSA infections.     Studies: Ct Abdomen Pelvis W Contrast  07/01/2013   CLINICAL DATA:  Abdominal wall infection question deep tissue infection, abdominal pain, fever, history non ischemic cardiomyopathy, atrial fibrillation, hypertension ; had a retroperitoneal hematoma in March 2015  EXAM: CT ABDOMEN AND PELVIS WITH CONTRAST  TECHNIQUE: Multidetector CT imaging of the abdomen and pelvis was  performed using the standard protocol following bolus administration of intravenous contrast. Sagittal and coronal MPR images reconstructed from axial data set.  CONTRAST:  80mL OMNIPAQUE IOHEXOL 300 MG/ML SOLN IV. Dilute oral contrast.  COMPARISON:  05/12/2013  FINDINGS: Small bibasilar pleural effusions and atelectasis.  Focus of infiltrate centrally in RIGHT lower lobe.  Pacemaker leads RIGHT atrium, RIGHT ventricle and coronary sinus.  Enlargement of cardiac chambers.  Distended gallbladder with dependent density which could represent milk-of-calcium or tiny layering stones.  No definite gallbladder wall thickening or pericholecystic edema by CT.  Liver,  spleen, and pancreas normal appearance.  Beam hardening artifacts from patient's RIGHT arm.  No definite focal renal abnormalities within limitations of artifacts.  LEFT adrenal mass with indeterminate attenuation 2.3 x 2.1 cm image 26 previously 2.2 x 1.8 cm by my re-measurement.  Extensive subcutaneous gas and edema throughout the anterior abdominal wall from the subxiphoid region to the umbilicus compatible with soft tissue infection by a gas-forming organism.  Bumper of a gastrostomy tube is located external to the anterior abdominal wall fascia external to the stomach and abdominal cavity.  Observed anterior abdominal wall gas extends down to the external oblique fascia without definite extension into the abdominal wall muscular planes.  No intra-abdominal extension of gas is identified.  Significant interval decrease in size of previously seen LEFT iliopsoas hematoma.  Area of low attenuation within LEFT psoas 2.2 x 1.9 x ~4.9 cm may represent a liquified hematoma.  No foci of gas are seen within the question to suggest infection, though infection cannot be excluded by CT.  Foley catheter decompresses urinary bladder.  Small LEFT ovarian cysts.  Stomach and bowel loops normal appearance.  No additional mass, adenopathy, free fluid, or free air.   Atherosclerotic calcifications without aneurysm.  Degenerative disc disease changes at L4-L5 and L3-L4 with osseous demineralization noted.  IMPRESSION: Extensive gas in subcutaneous edema within the anterior abdominal wall compatible with infection by gas-forming organism.  Bumper of gastrostomy tube is located external to the stomach, superficial to the anterior abdominal wall fascia within the subcutaneous fat, recommend removal and replacement.  Interval decrease in size of LEFT iliopsoas hematoma since previous exam, now containing an area of central low attenuation likely a liquified hematoma though infection is never excluded by CT.  Cholelithiasis questioned.  LEFT adrenal mass again identified.  Minimal RIGHT lower lobe infiltrate.  Findings called to Dr. Marolyn Haller on 07/01/2013 at 1234 hours.   Electronically Signed   By: Ulyses Southward M.D.   On: 07/01/2013 12:46   Dg Chest Port 1 View  07/01/2013   CLINICAL DATA:  Fever and left upper quadrant abdominal pain.  EXAM: PORTABLE CHEST - 1 VIEW  COMPARISON:  Chest radiograph performed 06/12/2013  FINDINGS: The patient's tracheostomy tube is seen ending 5-6 cm above the carina.  The lungs are relatively well expanded. Mild basilar airspace opacities may reflect atelectasis or mild pneumonia. This is similar in appearance to the prior study. No pleural effusion or pneumothorax is seen.  The cardiomediastinal silhouette is borderline normal in size. The pacemaker is seen overlying the left chest wall, with leads ending overlying the right atrium, right ventricle and coronary sinus. No acute osseous abnormalities are seen.  IMPRESSION: 1. Tracheostomy tube seen ending 5-6 cm above the carina. 2. Mild bibasilar airspace opacities may reflect atelectasis or mild pneumonia.   Electronically Signed   By: Roanna Raider M.D.   On: 07/01/2013 06:33    Scheduled Meds: . fentaNYL  50 mcg Transdermal Q72H  . heparin  5,000 Units Subcutaneous 3 times per day  . insulin  aspart  0-9 Units Subcutaneous 6 times per day  . levothyroxine  50 mcg Intravenous Daily  . LORazepam  1 mg Intravenous Once  . piperacillin-tazobactam (ZOSYN)  IV  3.375 g Intravenous 3 times per day  . sodium chloride  3 mL Intravenous Q12H  . vancomycin  750 mg Intravenous Q12H   Continuous Infusions: . sodium chloride         Time spent: 35 minutes  Clydia Llano  [facility] Pager [phone] If 7PM-7AM, please contact night-coverage at [email or web address], password [facility] 07/01/2013, 1:19 PM  LOS: 0 days

## 2013-07-01 NOTE — Progress Notes (Signed)
INITIAL NUTRITION ASSESSMENT  DOCUMENTATION CODES Per approved criteria  -Not Applicable   INTERVENTION: If pt remains NPO > 24 hours, recommend initiation of nutrition support. Utlize 36M PEPuP Protocol: initiate TF via NGT with Vital 1.5 at 25 ml/hr and Prostat 30ml BID on day 1: on day 2, increase to goal rate of 40 ml/hr (960 ml per day) to provide 1640 kcals, 95 grams of protein, and 734 ml free water.  TF regimen providing 100% of estimated calorie and protein needs.   RD to continue to follow nutrition care plan.   NUTRITION DIAGNOSIS: Inadequate oral intake related to inability to eat as evidenced by NPO status.   Goal: Meet >/=90% of estimated nutrition needs   Monitor:  Diet advancement, TF, weight trends, labs   Reason for Assessment: Positive Malnutrition Screening Tool Score   80 y.o. female  Admitting Dx: Severe sepsis  ASSESSMENT: Patient is a 80 y.o. female with h/o pacemaker due to heart block, chronic AMS (currently at her poor baseline, presumably this is due to her history of cardiac arrest), in nursing home, chronic trach and peg. She presents to the ED with fever and tachycardia. It was noted by her SNF this evening that patient had redness extending across her abdomen surrounding the gastric feeding tube.  Patient had recent hospitalization (05/2013) with norovirus gastroenteritis, respiratory failure, septic shock. Course complicated by ARDS, AKI, and cardiac arrest. Tracheostomy and PEG tube placed.   Pt is currently not receiving nutrition support as the PEG is the site of infection. Pts husband in room to provide diet history. Pt was on nocturnal tube feedings at SNF from 8pm - 8am via PEG and eating a pureed diet during the day. States that pt has lost some weight since the PEG placement in March 2015. He states that her usual body weight is 160-166 lbs.    Pt shows a severe for time frame weight loss (17%) since previous admission 1.5 months ago. This  could be due to fluid because charts report anasarca at last admission.  Nutrition Focused Physical Exam did not reveal any signs of depletion of muscle mass and body fat.   Cannot diagnose malnutrition at this time because of lack of detailed information on PO intake and given that most of nutrition is coming from PEG. No edema present.   CBG's: 160, 151, 109  Height: Ht Readings from Last 1 Encounters:  05/03/13 5' 2.99" (1.6 m)    Weight: Wt Readings from Last 1 Encounters:  07/01/13 144 lb 6.4 oz (65.5 kg)    Ideal Body Weight: 52.3 kg   % Ideal Body Weight: 150%   Wt Readings from Last 10 Encounters:  07/01/13 144 lb 6.4 oz (65.5 kg)  05/05/13 173 lb 4.5 oz (78.6 kg)  04/22/13 192 lb 7.4 oz (87.3 kg)  11/04/12 161 lb 12.8 oz (73.392 kg)  04/10/12 154 lb (69.854 kg)  08/27/11 166 lb 6.4 oz (75.479 kg)  03/05/11 169 lb (76.658 kg)  02/11/11 168 lb 3.2 oz (76.295 kg)  05/23/09 169 lb (76.658 kg)  04/25/09 169 lb (76.658 kg)    Usual Body Weight: 160 lbs  % Usual Body Weight: 90%  BMI:  Body mass index is 25.59 kg/(m^2).   Estimated Nutritional Needs: Kcal: 1450 - 1650  Protein: 85 - 100 g  Fluid: >/1.5 L /day  Skin: WNL   Diet Order: NPO  EDUCATION NEEDS: -Education not appropriate at this time   Intake/Output Summary (Last 24 hours) at  07/01/13 1214 Last data filed at 07/01/13 0900  Gross per 24 hour  Intake    375 ml  Output      0 ml  Net    375 ml    Last BM: PTA   Labs:   Recent Labs Lab 07/01/13 0257 07/01/13 0314  NA 142 141  K 4.1 4.2  CL 97 99  CO2 27  --   BUN 41* 45*  CREATININE 1.13* 1.30*  CALCIUM 9.6  --   GLUCOSE 194* 192*    CBG (last 3)   Recent Labs  07/01/13 0610 07/01/13 0644  GLUCAP 160* 151*   No results found for this basename: HGBA1C     Scheduled Meds: . fentaNYL  50 mcg Transdermal Q72H  . heparin  5,000 Units Subcutaneous 3 times per day  . insulin aspart  0-9 Units Subcutaneous 6 times per  day  . levothyroxine  50 mcg Intravenous Daily  . LORazepam  1 mg Intravenous Once  . piperacillin-tazobactam (ZOSYN)  IV  3.375 g Intravenous 3 times per day  . sodium chloride  3 mL Intravenous Q12H  . vancomycin  750 mg Intravenous Q12H    Continuous Infusions: . sodium chloride      Past Medical History  Diagnosis Date  . Hypothyroid   . Complete heart block 2002    Initial PPM 2002 with upgrade by JA to CRT-P 11/21/08  . Nonischemic cardiomyopathy   . Persistent atrial fibrillation   . Hypertension   . Hyperlipidemia   . Hives 4/10  . Cataract 4/13    right  . Renal failure     3rd stage-seeing neurologist  . Cardiac arrest     Past Surgical History  Procedure Laterality Date  . Pacemaker insertion  2002, 2010    PPM 2002, upgrade to CRT-P by Dr Johney Frame (MDT 2010)  . Partial colectomy  1/04  . Abdominal hysterectomy  1987    TAH  . Bunionectomy Right 1996  . Breast biopsy Left 01/1997  . Cataract extraction  4/13    both eyes  . Cyst excision      on back of left ear    Eppie Gibson, BS Dietetic Intern Pager: [phone]   I agree with student dietitian note; appropriate revisions have been made.  Joaquin Courts, RD, LDN, CNSC Pager# [phone] After Hours Pager# [phone]

## 2013-07-01 NOTE — Progress Notes (Signed)
Sputum sample collected w/ sterile technique, sample labeled w/ pt label and walked down to lab w/ approp. Requisition.  Lab personal received sample.

## 2013-07-01 NOTE — Transfer of Care (Signed)
Immediate Anesthesia Transfer of Care Note  Patient: [name]  Procedure(s) Performed: Procedure(s): DEBRIDEMENT OF ABDOMINAL WALL ABSCESS, FEEDING TUBE PLACEMENT (N/A)  Patient Location: ICU  Anesthesia Type:General  Level of Consciousness: awake  Airway & Oxygen Therapy: Patient remains intubated per anesthesia plan  Post-op Assessment: Report given to PACU RN and Post -op Vital signs reviewed and stable  Post vital signs: Reviewed and stable  Complications: No apparent anesthesia complications

## 2013-07-01 NOTE — Progress Notes (Signed)
ANTIBIOTIC CONSULT NOTE - INITIAL  Pharmacy Consult for Vancomycin/Zosyn Indication: rule out sepsis  Allergies  Allergen Reactions  . Beta Adrenergic Blockers Other (See Comments)    Not working well for pt.  Sherrie Mustache Hcl] Shortness Of Breath  . Carvedilol Shortness Of Breath    Chest  Discomfort.  . Dapsone Other (See Comments)    Kidney  failure  . Diclofenac Sodium Other (See Comments)    Causes  Fluid  Retention.  Consuelo Pandy [Pitavastatin Calcium] Shortness Of Breath  . Methotrexate Shortness Of Breath    Mouth sores.  . Metoprolol Succinate Hives  . Oxaprozin Diarrhea  . Other     Fragrance mixtures -  Cinol botanicals  &  Plants oil. Cocamidoproy   -   Betaine. Lipstick & other makeup & cleaning materials.   Marland Kitchen Epinephrine     Patient Measurements: 78.6 kg  Vital Signs: BP: 123/43 mmHg (05/01 0430) Pulse Rate: 93 (05/01 0430)  Labs:  Recent Labs  07/01/13 0257 07/01/13 0314  WBC 18.9*  --   HGB 13.0 16.0*  PLT 270  --   CREATININE 1.13* 1.30*    Medical History: Past Medical History  Diagnosis Date  . Hypothyroid   . Complete heart block 2002    Initial PPM 2002 with upgrade by JA to CRT-P 11/21/08  . Nonischemic cardiomyopathy   . Persistent atrial fibrillation   . Hypertension   . Hyperlipidemia   . Hives 4/10  . Cataract 4/13    right  . Renal failure     3rd stage-seeing neurologist  . Cardiac arrest    Assessment: 80 y/o F from [place] with fever, WBC 18.9, lactic acid 4.27, slight bump in Scr to 1.30, other labs as above. To start broad spectrum antibiotics for r/o sepsis.   Goal of Therapy:  Vancomycin trough level 15-20 mcg/ml  Plan:  -Vancomycin 750 mg IV q12h -Zosyn 3.375G IV q8h to be infused over 4 hours -Trend WBC, temp, renal function  -Drug levels as indicated   Abran Duke 07/01/2013,5:04 AM

## 2013-07-01 NOTE — ED Notes (Addendum)
Respiratory tech called to inform of patient O2 saturations. RT indicated he will come to bedside.

## 2013-07-01 NOTE — Progress Notes (Signed)
25 ML Omnipaque 300 given in 325 ML of water at 9:45 am. Awaiting CT scan.

## 2013-07-02 DIAGNOSIS — N179 Acute kidney failure, unspecified: Secondary | ICD-10-CM

## 2013-07-02 DIAGNOSIS — N189 Chronic kidney disease, unspecified: Secondary | ICD-10-CM

## 2013-07-02 LAB — LACTIC ACID, PLASMA: Lactic Acid, Venous: 1.6 mmol/L (ref 0.5–2.2)

## 2013-07-02 LAB — COMPREHENSIVE METABOLIC PANEL
ALBUMIN: 1.7 g/dL — AB (ref 3.5–5.2)
ALK PHOS: 67 U/L (ref 39–117)
ALT: 13 U/L (ref 0–35)
AST: 15 U/L (ref 0–37)
BILIRUBIN TOTAL: 0.6 mg/dL (ref 0.3–1.2)
BUN: 34 mg/dL — AB (ref 6–23)
CO2: 27 meq/L (ref 19–32)
CREATININE: 1.05 mg/dL (ref 0.50–1.10)
Calcium: 8.1 mg/dL — ABNORMAL LOW (ref 8.4–10.5)
Chloride: 105 mEq/L (ref 96–112)
GFR calc Af Amer: 57 mL/min — ABNORMAL LOW (ref >90)
GFR calc non Af Amer: 49 mL/min — ABNORMAL LOW (ref >90)
Glucose, Bld: 120 mg/dL — ABNORMAL HIGH (ref 70–99)
POTASSIUM: 3.7 meq/L (ref 3.7–5.3)
Sodium: 144 mEq/L (ref 137–147)
Total Protein: 5.4 g/dL — ABNORMAL LOW (ref 6.0–8.3)

## 2013-07-02 LAB — PHOSPHORUS: PHOSPHORUS: 3.9 mg/dL (ref 2.3–4.6)

## 2013-07-02 LAB — CBC
HCT: 30.2 % — ABNORMAL LOW (ref 36.0–46.0)
Hemoglobin: 9.4 g/dL — ABNORMAL LOW (ref 12.0–15.0)
MCH: 29.6 pg (ref 26.0–34.0)
MCHC: 31.1 g/dL (ref 30.0–36.0)
MCV: 95 fL (ref 78.0–100.0)
Platelets: 214 10*3/uL (ref 150–400)
RBC: 3.18 MIL/uL — AB (ref 3.87–5.11)
RDW: 16.5 % — AB (ref 11.5–15.5)
WBC: 14.3 10*3/uL — AB (ref 4.0–10.5)

## 2013-07-02 LAB — GLUCOSE, CAPILLARY
GLUCOSE-CAPILLARY: 108 mg/dL — AB (ref 70–99)
GLUCOSE-CAPILLARY: 121 mg/dL — AB (ref 70–99)
Glucose-Capillary: 133 mg/dL — ABNORMAL HIGH (ref 70–99)
Glucose-Capillary: 104 mg/dL — ABNORMAL HIGH (ref 70–99)
Glucose-Capillary: 100 mg/dL — ABNORMAL HIGH (ref 70–99)
Glucose-Capillary: 119 mg/dL — ABNORMAL HIGH (ref 70–99)

## 2013-07-02 LAB — MAGNESIUM: Magnesium: 2 mg/dL (ref 1.5–2.5)

## 2013-07-02 MED ORDER — PRO-STAT SUGAR FREE PO LIQD
30.0000 mL | Freq: Two times a day (BID) | ORAL | Status: DC
  Administered 2013-07-02: 30 mL
  Filled 2013-07-02 (×3): qty 30

## 2013-07-02 MED ORDER — NOREPINEPHRINE BITARTRATE 1 MG/ML IV SOLN
2.0000 ug/min | INTRAVENOUS | Status: DC
  Administered 2013-07-02: 5 ug/min via INTRAVENOUS
  Filled 2013-07-02: qty 4

## 2013-07-02 MED ORDER — LEVOTHYROXINE SODIUM 100 MCG PO TABS
100.0000 ug | ORAL_TABLET | Freq: Every day | ORAL | Status: DC
  Filled 2013-07-02 (×2): qty 1

## 2013-07-02 MED ORDER — CLONAZEPAM 0.1 MG/ML ORAL SUSPENSION
0.5000 mg | Freq: Two times a day (BID) | ORAL | Status: DC
Start: 2013-07-02 — End: 2013-07-02

## 2013-07-02 MED ORDER — FAMOTIDINE 40 MG/5ML PO SUSR
20.0000 mg | Freq: Two times a day (BID) | ORAL | Status: DC
  Administered 2013-07-02 (×2): 20 mg
  Filled 2013-07-02 (×4): qty 2.5

## 2013-07-02 MED ORDER — DEXMEDETOMIDINE HCL IN NACL 200 MCG/50ML IV SOLN
0.4000 ug/kg/h | INTRAVENOUS | Status: DC
  Administered 2013-07-02: 0.4 ug/kg/h via INTRAVENOUS
  Administered 2013-07-02: 1.2 ug/kg/h via INTRAVENOUS
  Administered 2013-07-03: 0.4 ug/kg/h via INTRAVENOUS
  Filled 2013-07-02 (×3): qty 50

## 2013-07-02 MED ORDER — CLONAZEPAM 0.5 MG PO TABS
0.5000 mg | ORAL_TABLET | Freq: Two times a day (BID) | ORAL | Status: DC
  Administered 2013-07-02 (×2): 0.5 mg via ORAL
  Filled 2013-07-02 (×2): qty 1

## 2013-07-02 MED ORDER — INSULIN ASPART 100 UNIT/ML ~~LOC~~ SOLN
0.0000 [IU] | SUBCUTANEOUS | Status: DC
  Administered 2013-07-02: 20:00:00 via SUBCUTANEOUS
  Administered 2013-07-03: 2 [IU] via SUBCUTANEOUS
  Administered 2013-07-03: 1 [IU] via SUBCUTANEOUS
  Administered 2013-07-03: 2 [IU] via SUBCUTANEOUS
  Administered 2013-07-05 – 2013-07-06 (×5): 1 [IU] via SUBCUTANEOUS
  Administered 2013-07-06 – 2013-07-07 (×4): 2 [IU] via SUBCUTANEOUS

## 2013-07-02 MED ORDER — VITAL 1.5 CAL PO LIQD
1000.0000 mL | ORAL | Status: DC
  Administered 2013-07-02: 1000 mL
  Filled 2013-07-02: qty 1000

## 2013-07-02 MED ORDER — SODIUM CHLORIDE 0.9 % IV BOLUS (SEPSIS)
1000.0000 mL | Freq: Once | INTRAVENOUS | Status: AC
  Administered 2013-07-02: 1000 mL via INTRAVENOUS

## 2013-07-02 MED ORDER — FENTANYL CITRATE 0.05 MG/ML IJ SOLN
25.0000 ug/h | INTRAMUSCULAR | Status: DC
  Filled 2013-07-02: qty 50

## 2013-07-02 MED ORDER — POTASSIUM CHLORIDE IN NACL 20-0.45 MEQ/L-% IV SOLN
INTRAVENOUS | Status: DC
  Administered 2013-07-02 – 2013-07-03 (×3): via INTRAVENOUS
  Filled 2013-07-02 (×2): qty 1000

## 2013-07-02 MED ORDER — VITAL HIGH PROTEIN PO LIQD
1000.0000 mL | ORAL | Status: DC
  Administered 2013-07-02: 1000 mL
  Filled 2013-07-02 (×2): qty 1000

## 2013-07-02 MED ORDER — SODIUM CHLORIDE 0.9 % IV BOLUS (SEPSIS)
500.0000 mL | Freq: Once | INTRAVENOUS | Status: AC
  Administered 2013-07-02: 500 mL via INTRAVENOUS

## 2013-07-02 MED ORDER — ACETAMINOPHEN 160 MG/5ML PO SOLN: 650.0000 mg | ORAL | Status: DC | PRN

## 2013-07-02 NOTE — Progress Notes (Signed)
PULMONARY / CRITICAL CARE MEDICINE   Name: [name] MRN: [id number] DOB: 28-May-1932    ADMISSION DATE:  07/01/2013 CONSULTATION DATE:  5./1/15  REFERRING MD :  Dr Derrell Lolling PRIMARY SERVICE: [facility]  CHIEF COMPLAINT:  Abdominal pain REASON FOR CONSULTATION: Vent Management, Severe Sepsis  BRIEF PATIENT DESCRIPTION:  80 yo chronically ill woman with complicated hospitalization 2-05/2013 to [facility], [facility], [facility] and then back to [facility] before discharge to SNF.  She is s/p Trach and PEG in aftermath of gastroenteritis + ARDS, retroperitoneal bleed, pneumothorax; then PNA (with MDR pseudomonas), septic and hemorrhagic shock. She was weaned to ATC at [facility] and was discharged to SNF for further care. Her other chronic medical problems > Heart block s/p pacer placement (100% paced), hypothyroidism, DM, A Fib, CKD. She was admitted with fever, abdominal pain and erythema of her abdominal wall 5/1. Was found to have subcutaneous tissue infection due to malpositioned PEG requiring significant debridement 5/1. She returned to ICU on MV, hemodynamically stable.   SIGNIFICANT EVENTS / STUDIES:  5/01 CT abd/pelvis: extensive gas in sq tissue consistent with infection, PEG tube out of stomach and into the subcutaneous fat, improved iliopsoas hematoma, possible cholelithiasis, L adrenal mass, small RLL infiltrate 5/01 OR for debridement and irrigation of abd wall, placement of G tube, sub Q drain placement  LINES / TUBES: Trach 2/18  >>  R femoral CVL 5/01 >>   CULTURES: MRSA PCR 5/01 >> NEG Resp 5/01 >>  Urine 5/01 >>  Blood 5/01  >>    ANTIBIOTICS: Vancomycin 5/1 >>  Pip/tazo 5/1 >>    SUBJECTIVE:  Left on vent after surgery. Intermittently agitated and receiving boluses of lorazepam  VITAL SIGNS: Temp:  [98.2 F (36.8 C)-100.2 F (37.9 C)] 99 F (37.2 C) (05/02 0731) Pulse Rate:  [77-109] 77 (05/02 0700) Resp:  [14-29] 14 (05/02 0350) BP: (77-160)/(24-117) 87/41 mmHg (05/02  0700) SpO2:  [90 %-100 %] 100 % (05/02 0700) FiO2 (%):  [28 %-40 %] 40 % (05/02 0732) Weight:  [65.5 kg (144 lb 6.4 oz)-67 kg (147 lb 11.3 oz)] 67 kg (147 lb 11.3 oz) (05/02 0515) HEMODYNAMICS:   VENTILATOR SETTINGS: Vent Mode:  [-] PRVC FiO2 (%):  [28 %-40 %] 40 % Set Rate:  [14 bmp] 14 bmp Vt Set:  [420 mL-500 mL] 500 mL PEEP:  [5 cmH20] 5 cmH20 Plateau Pressure:  [21 cmH20-25 cmH20] 21 cmH20 INTAKE / OUTPUT: Intake/Output     05/01 0701 - 05/02 0700 05/02 0701 - 05/03 0700   I.V. (mL/kg) 2085 (31.1)    Other 330    IV Piggyback 1450    Total Intake(mL/kg) 3865 (57.7)    Urine (mL/kg/hr) 1060 (0.7)    Total Output 1060     Net +2805          Stool Occurrence 2 x      PHYSICAL EXAMINATION: General:  RASS -2 to +1, intermittently F/C Neuro:  No focal deficits HEENT:  WNL Cardiovascular:  Regular, paced, no M Lungs:  Clear bilaterally Abdomen:  Large ventral dressing with some serous drainage onto gauze and sheet, no BS heard, tender to touch Ext:  No edema, warm No rash  LABS: I have reviewed all of today's lab results. Relevant abnormalities are discussed in the A/P section  CXR: NNF  ASSESSMENT / PLAN:  PULMONARY A: Acute on chronic respiratory failure, now VDRF post-operatively Tracheostomy status Hx HCAP 2-05/2013 Hx pneumothorax 2-05/2013 P:   Vent settings reviewed and/or  adjusted Cont vent bundle Wean in PSV > ATC as tolerated  CARDIOVASCULAR A: CAF Complete heart block s/p pacer Hx cardiac arrest 2-05/2013 due to mucous plugging P:  Monitor rhythm and BP  RENAL A:  Acute renal insufficiency (baseline Cr 0.78), improving P:   Monitor BMET intermittently Monitor I/Os Correct electrolytes as indicated IVFs adjusted  GASTROINTESTINAL A:  Malpositioned PEG, s/p replacement 5/01 Severe ventral subcutaneous infection, s/p debridement 5/01 Protein-cal malnutrition  P:   SUP: enteral famotidine Begin TFs 5/02  HEMATOLOGIC A:   Mild anemia  without acute blood loss Hx of retroperitoneal bleed 2-05/2013 P:  DVT px: SQ heparin Monitor CBC intermittently Transfuse per usual ICU guidelines  INFECTIOUS A:   Severe Sepsis Subcutaneous abdominal tissue infection, debrided 5/1 UTI with chronic foley P:   Micro and abx as above  ENDOCRINE A:   DM hypothyroidism   P:   Change levothyroxine to enteral Cont SSI Holding Levemir  NEUROLOGIC A:   Hx anoxic brain injury  Post op pain Hx chronic clonazepam use ICU associated agitation P:   Begin dex infusion 5/02 - hopefully short term Resume prior dose of clonazepam 5/02 Cont fentanyl patch Cont PRN fentanyl   I have personally obtained a history, examined the patient, evaluated laboratory and imaging results, formulated the assessment and plan and placed orders. CRITICAL CARE: The patient is critically ill with multiple organ systems failure and requires high complexity decision making for assessment and support, frequent evaluation and titration of therapies, application of advanced monitoring technologies and extensive interpretation of multiple databases. Critical Care Time devoted to patient care services described in this note is 40 minutes.    Billy Fischeravid Amaziah Raisanen, MD ; [facility] Mobile [phone].  After 5:30 PM or weekends, call [phone]

## 2013-07-02 NOTE — Progress Notes (Signed)
Name: [name] MRN: [id number] DOB: November 06, 1932  ELECTRONIC ICU PHYSICIAN NOTE  Problem:  Agitation despite max precedex  Intervention:  Add back fentanyl drip   Nyoka Cowden 07/02/2013, 4:01 PM

## 2013-07-02 NOTE — Progress Notes (Addendum)
NUTRITION FOLLOW UP  Intervention:   If pt remains NPO > 24 hours, recommend initiation of nutrition support. [place] 81M PEPuP Protocol: initiate TF via NGT with Vital 1.5 at 25 ml/hr and Prostat 30ml BID on day 1: on day 2, increase to goal rate of 40 ml/hr (960 ml per day) to provide 1640 kcals, 95 grams of protein, and 734 ml free water.  TF regimen providing 100% of estimated calorie and protein needs.  RD to continue to follow nutrition care plan.   NUTRITION DIAGNOSIS:  Inadequate oral intake related to inability to eat as evidenced by NPO status.   Goal:  Meet >/=90% of estimated nutrition needs   Monitor:  Diet advancement, TF, weight trends, labs   Assessment:   Patient is a 80 y.o. female with h/o pacemaker due to heart block, chronic AMS (currently at her poor baseline, presumably this is due to her history of cardiac arrest), in nursing home, chronic trach and peg. She presents to the ED with fever and tachycardia. It was noted by her SNF this evening that patient had redness extending across her abdomen surrounding the gastric feeding tube.  Patient had recent hospitalization (05/2013) with norovirus gastroenteritis, respiratory failure, septic shock. Course complicated by ARDS, AKI, and cardiac arrest. Tracheostomy and PEG tube placed.  RD consulted for TFs.   Vital High Protein is currently infusing at 20 mL/hr.  Patient is currently intubated on ventilator support MV: 7.5 L/min Temp (24hrs), Avg:98.8 F (37.1 C), Min:97.8 F (36.6 C), Max:99.4 F (37.4 C)  Propofol: none  Height: Ht Readings from Last 1 Encounters:  05/03/13 5' 2.99" (1.6 m)    Weight Status:   Wt Readings from Last 1 Encounters:  07/02/13 147 lb 11.3 oz (67 kg)    Re-estimated needs:  Kcal: 1244 Protein: 85-100g Fluid: >1.5 L/day  Skin: generalized edema  Diet Order: NPO   Intake/Output Summary (Last 24 hours) at 07/02/13 1636 Last data filed at 07/02/13 1600  Gross per 24 hour   Intake 2764.54 ml  Output    835 ml  Net 1929.54 ml    Last BM: 5/1   Labs:   Recent Labs Lab 07/01/13 0257 07/01/13 0314 07/01/13 2037 07/02/13 0400  NA 142 141 143 144  K 4.1 4.2 3.7 3.7  CL 97 99 102 105  CO2 27  --  29 27  BUN 41* 45* 33* 34*  CREATININE 1.13* 1.30* 1.01 1.05  CALCIUM 9.6  --  8.4 8.1*  MG  --   --  1.9  --   PHOS  --   --  4.1  --   GLUCOSE 194* 192* 104* 120*    CBG (last 3)   Recent Labs  07/02/13 0723 07/02/13 1106 07/02/13 1530  GLUCAP 104* 100* 119*    Scheduled Meds: . antiseptic oral rinse  15 mL Mouth Rinse QID  . chlorhexidine  15 mL Mouth Rinse BID  . clonazePAM  0.5 mg Oral BID  . famotidine  20 mg Per Tube BID  . feeding supplement (VITAL HIGH PROTEIN)  1,000 mL Per Tube Q24H  . fentaNYL  50 mcg Transdermal Q72H  . heparin  5,000 Units Subcutaneous 3 times per day  . insulin aspart  0-9 Units Subcutaneous 6 times per day  . [START ON 07/03/2013] levothyroxine  100 mcg Per Tube QAC breakfast  . piperacillin-tazobactam (ZOSYN)  IV  3.375 g Intravenous 3 times per day  . sodium chloride  3 mL Intravenous  Q12H  . vancomycin  750 mg Intravenous Q12H    Continuous Infusions: . 0.45 % NaCl with KCl 20 mEq / L 50 mL/hr at 07/02/13 1155  . dexmedetomidine 1.2 mcg/kg/hr (07/02/13 1556)  . fentaNYL infusion INTRAVENOUS    . norepinephrine (LEVOPHED) Adult infusion 5 mcg/min (07/02/13 1610)    Loyce Dys, MS RD LDN Clinical Inpatient Dietitian Pager: [phone] Weekend/After hours pager: [phone]

## 2013-07-02 NOTE — Progress Notes (Signed)
1 Day Post-Op  Subjective: Pt with no new issues overnight.   Objective: Vital signs in last 24 hours: Temp:  [98.2 F (36.8 C)-100.2 F (37.9 C)] 99 F (37.2 C) (05/02 0731) Pulse Rate:  [77-109] 77 (05/02 0700) Resp:  [14-29] 14 (05/02 0350) BP: (77-160)/(24-117) 87/41 mmHg (05/02 0700) SpO2:  [90 %-100 %] 100 % (05/02 0700) FiO2 (%):  [28 %-40 %] 40 % (05/02 0732) Weight:  [144 lb 6.4 oz (65.5 kg)-147 lb 11.3 oz (67 kg)] 147 lb 11.3 oz (67 kg) (05/02 0515) Last BM Date: 07/01/13  Intake/Output from previous day: 05/01 0701 - 05/02 0700 In: 3865 [I.V.:2085; IV Piggyback:1450] Out: 1060 [Urine:1060] Intake/Output this shift:    General appearance: alert and cooperative Cardio: stable GI: abd wall with dressing in place, G-tube in place  Lab Results:   Recent Labs  07/01/13 2037 07/02/13 0400  WBC 14.5* 14.3*  HGB 10.2* 9.4*  HCT 33.1* 30.2*  PLT 152 214   BMET  Recent Labs  07/01/13 2037 07/02/13 0400  NA 143 144  K 3.7 3.7  CL 102 105  CO2 29 27  GLUCOSE 104* 120*  BUN 33* 34*  CREATININE 1.01 1.05  CALCIUM 8.4 8.1*   PT/INR  Recent Labs  07/01/13 1424  LABPROT 17.3*  INR 1.45   ABG  Recent Labs  07/01/13 2050  PHART 7.453*  HCO3 28.8*    Studies/Results: Dg Abd 1 View  07/01/2013   CLINICAL DATA:  G-tube placement.  EXAM: ABDOMEN - 1 VIEW; DG C-ARM 1-60 MIN  COMPARISON:  None.  FINDINGS: Multiple C arm views demonstrate contrast injection through the gastrostomy tube with the contrast in the stomach and extending into the duodenum.  IMPRESSION: G-tube in the stomach.   Electronically Signed   By: Gordan Payment M.D.   On: 07/01/2013 17:20   Ct Abdomen Pelvis W Contrast  07/01/2013   CLINICAL DATA:  Abdominal wall infection question deep tissue infection, abdominal pain, fever, history non ischemic cardiomyopathy, atrial fibrillation, hypertension ; had a retroperitoneal hematoma in March 2015  EXAM: CT ABDOMEN AND PELVIS WITH CONTRAST   TECHNIQUE: Multidetector CT imaging of the abdomen and pelvis was performed using the standard protocol following bolus administration of intravenous contrast. Sagittal and coronal MPR images reconstructed from axial data set.  CONTRAST:  75mL OMNIPAQUE IOHEXOL 300 MG/ML SOLN IV. Dilute oral contrast.  COMPARISON:  05/12/2013  FINDINGS: Small bibasilar pleural effusions and atelectasis.  Focus of infiltrate centrally in RIGHT lower lobe.  Pacemaker leads RIGHT atrium, RIGHT ventricle and coronary sinus.  Enlargement of cardiac chambers.  Distended gallbladder with dependent density which could represent milk-of-calcium or tiny layering stones.  No definite gallbladder wall thickening or pericholecystic edema by CT.  Liver, spleen, and pancreas normal appearance.  Beam hardening artifacts from patient's RIGHT arm.  No definite focal renal abnormalities within limitations of artifacts.  LEFT adrenal mass with indeterminate attenuation 2.3 x 2.1 cm image 26 previously 2.2 x 1.8 cm by my re-measurement.  Extensive subcutaneous gas and edema throughout the anterior abdominal wall from the subxiphoid region to the umbilicus compatible with soft tissue infection by a gas-forming organism.  Bumper of a gastrostomy tube is located external to the anterior abdominal wall fascia external to the stomach and abdominal cavity.  Observed anterior abdominal wall gas extends down to the external oblique fascia without definite extension into the abdominal wall muscular planes.  No intra-abdominal extension of gas is identified.  Significant interval decrease  in size of previously seen LEFT iliopsoas hematoma.  Area of low attenuation within LEFT psoas 2.2 x 1.9 x ~4.9 cm may represent a liquified hematoma.  No foci of gas are seen within the question to suggest infection, though infection cannot be excluded by CT.  Foley catheter decompresses urinary bladder.  Small LEFT ovarian cysts.  Stomach and bowel loops normal appearance.  No  additional mass, adenopathy, free fluid, or free air.  Atherosclerotic calcifications without aneurysm.  Degenerative disc disease changes at L4-L5 and L3-L4 with osseous demineralization noted.  IMPRESSION: Extensive gas in subcutaneous edema within the anterior abdominal wall compatible with infection by gas-forming organism.  Bumper of gastrostomy tube is located external to the stomach, superficial to the anterior abdominal wall fascia within the subcutaneous fat, recommend removal and replacement.  Interval decrease in size of LEFT iliopsoas hematoma since previous exam, now containing an area of central low attenuation likely a liquified hematoma though infection is never excluded by CT.  Cholelithiasis questioned.  LEFT adrenal mass again identified.  Minimal RIGHT lower lobe infiltrate.  Findings called to Dr. Marolyn Haller on 07/01/2013 at 1234 hours.   Electronically Signed   By: Ulyses Southward M.D.   On: 07/01/2013 12:46   Dg Chest Port 1 View  07/01/2013   CLINICAL DATA:  Postop  EXAM: PORTABLE CHEST - 1 VIEW  COMPARISON:  07/01/2013 at 0307 hr  FINDINGS: Mild interstitial edema, new. Suspected layering small left pleural effusion. No pneumothorax.  Tracheostomy in satisfactory position at the thoracic inlet.  The heart is top-normal in size.  Left subclavian pacemaker.  IMPRESSION: Mild interstitial edema with suspected small left pleural effusion, new.   Electronically Signed   By: Charline Bills M.D.   On: 07/01/2013 19:08   Dg Chest Port 1 View  07/01/2013   CLINICAL DATA:  Fever and left upper quadrant abdominal pain.  EXAM: PORTABLE CHEST - 1 VIEW  COMPARISON:  Chest radiograph performed 06/12/2013  FINDINGS: The patient's tracheostomy tube is seen ending 5-6 cm above the carina.  The lungs are relatively well expanded. Mild basilar airspace opacities may reflect atelectasis or mild pneumonia. This is similar in appearance to the prior study. No pleural effusion or pneumothorax is seen.  The  cardiomediastinal silhouette is borderline normal in size. The pacemaker is seen overlying the left chest wall, with leads ending overlying the right atrium, right ventricle and coronary sinus. No acute osseous abnormalities are seen.  IMPRESSION: 1. Tracheostomy tube seen ending 5-6 cm above the carina. 2. Mild bibasilar airspace opacities may reflect atelectasis or mild pneumonia.   Electronically Signed   By: Roanna Raider M.D.   On: 07/01/2013 06:33   Dg C-arm 1-60 Min  07/01/2013   CLINICAL DATA:  G-tube placement.  EXAM: ABDOMEN - 1 VIEW; DG C-ARM 1-60 MIN  COMPARISON:  None.  FINDINGS: Multiple C arm views demonstrate contrast injection through the gastrostomy tube with the contrast in the stomach and extending into the duodenum.  IMPRESSION: G-tube in the stomach.   Electronically Signed   By: Gordan Payment M.D.   On: 07/01/2013 17:20    Anti-infectives: Anti-infectives   Start     Dose/Rate Route Frequency Ordered Stop   07/01/13 1500  vancomycin (VANCOCIN) IVPB 750 mg/150 ml premix     750 mg 150 mL/hr over 60 Minutes Intravenous Every 12 hours 07/01/13 0508     07/01/13 1000  piperacillin-tazobactam (ZOSYN) IVPB 3.375 g     3.375 g 12.5  mL/hr over 240 Minutes Intravenous 3 times per day 07/01/13 0508     07/01/13 0300  piperacillin-tazobactam (ZOSYN) IVPB 3.375 g     3.375 g 100 mL/hr over 30 Minutes Intravenous  Once 07/01/13 0258 07/01/13 0355   07/01/13 0300  vancomycin (VANCOCIN) IVPB 1000 mg/200 mL premix     1,000 mg 200 mL/hr over 60 Minutes Intravenous  Once 07/01/13 0258 07/01/13 0413      Assessment/Plan: s/p Procedure(s): DEBRIDEMENT OF ABDOMINAL WALL ABSCESS, FEEDING TUBE PLACEMENT (N/A) Con't abx for cellulitis Penrose drains to drainage G-tube in place and OK to feed pt via tube Con't wound dressing changes Will follow   LOS: 1 day    Axel FillerArmando Johnell Landowski 07/02/2013

## 2013-07-03 ENCOUNTER — Inpatient Hospital Stay (HOSPITAL_COMMUNITY): Payer: Medicare Other

## 2013-07-03 LAB — GLUCOSE, CAPILLARY
GLUCOSE-CAPILLARY: 170 mg/dL — AB (ref 70–99)
GLUCOSE-CAPILLARY: 88 mg/dL (ref 70–99)
Glucose-Capillary: 103 mg/dL — ABNORMAL HIGH (ref 70–99)
Glucose-Capillary: 118 mg/dL — ABNORMAL HIGH (ref 70–99)
Glucose-Capillary: 128 mg/dL — ABNORMAL HIGH (ref 70–99)
Glucose-Capillary: 177 mg/dL — ABNORMAL HIGH (ref 70–99)

## 2013-07-03 LAB — MAGNESIUM: Magnesium: 2 mg/dL (ref 1.5–2.5)

## 2013-07-03 LAB — CBC
HCT: 29.8 % — ABNORMAL LOW (ref 36.0–46.0)
HEMOGLOBIN: 9.1 g/dL — AB (ref 12.0–15.0)
MCH: 29.3 pg (ref 26.0–34.0)
MCHC: 30.5 g/dL (ref 30.0–36.0)
MCV: 95.8 fL (ref 78.0–100.0)
Platelets: 183 10*3/uL (ref 150–400)
RBC: 3.11 MIL/uL — AB (ref 3.87–5.11)
RDW: 16.5 % — ABNORMAL HIGH (ref 11.5–15.5)
WBC: 14.2 10*3/uL — ABNORMAL HIGH (ref 4.0–10.5)

## 2013-07-03 LAB — COMPREHENSIVE METABOLIC PANEL
ALK PHOS: 74 U/L (ref 39–117)
ALT: 12 U/L (ref 0–35)
AST: 12 U/L (ref 0–37)
Albumin: 1.6 g/dL — ABNORMAL LOW (ref 3.5–5.2)
BILIRUBIN TOTAL: 0.3 mg/dL (ref 0.3–1.2)
BUN: 30 mg/dL — ABNORMAL HIGH (ref 6–23)
CHLORIDE: 107 meq/L (ref 96–112)
CO2: 25 meq/L (ref 19–32)
CREATININE: 0.9 mg/dL (ref 0.50–1.10)
Calcium: 8.3 mg/dL — ABNORMAL LOW (ref 8.4–10.5)
GFR calc Af Amer: 69 mL/min — ABNORMAL LOW (ref >90)
GFR calc non Af Amer: 59 mL/min — ABNORMAL LOW (ref >90)
Glucose, Bld: 117 mg/dL — ABNORMAL HIGH (ref 70–99)
Potassium: 3.3 mEq/L — ABNORMAL LOW (ref 3.7–5.3)
Sodium: 146 mEq/L (ref 137–147)
Total Protein: 5.4 g/dL — ABNORMAL LOW (ref 6.0–8.3)

## 2013-07-03 LAB — VANCOMYCIN, TROUGH: Vancomycin Tr: 29.4 ug/mL (ref 10.0–20.0)

## 2013-07-03 LAB — PHOSPHORUS: PHOSPHORUS: 3.1 mg/dL (ref 2.3–4.6)

## 2013-07-03 MED ORDER — M.V.I. ADULT IV INJ
INTRAVENOUS | Status: DC
  Filled 2013-07-03: qty 1000

## 2013-07-03 MED ORDER — LEVOTHYROXINE SODIUM 100 MCG IV SOLR
50.0000 ug | Freq: Every day | INTRAVENOUS | Status: AC
  Administered 2013-07-03 – 2013-07-04 (×2): 50 ug via INTRAVENOUS
  Filled 2013-07-03 (×2): qty 5

## 2013-07-03 MED ORDER — KCL IN DEXTROSE-NACL 30-5-0.45 MEQ/L-%-% IV SOLN
INTRAVENOUS | Status: DC
  Administered 2013-07-03: 18:00:00 via INTRAVENOUS
  Filled 2013-07-03 (×2): qty 1000

## 2013-07-03 MED ORDER — LORAZEPAM 2 MG/ML IJ SOLN
0.5000 mg | Freq: Three times a day (TID) | INTRAMUSCULAR | Status: DC
  Administered 2013-07-03 – 2013-07-06 (×8): 0.5 mg via INTRAVENOUS
  Filled 2013-07-03 (×8): qty 1

## 2013-07-03 MED ORDER — VANCOMYCIN HCL IN DEXTROSE 750-5 MG/150ML-% IV SOLN
750.0000 mg | INTRAVENOUS | Status: DC
  Administered 2013-07-04: 750 mg via INTRAVENOUS
  Filled 2013-07-03 (×2): qty 150

## 2013-07-03 MED ORDER — FAT EMULSION 20 % IV EMUL
250.0000 mL | INTRAVENOUS | Status: DC
Start: 2013-07-03 — End: 2013-07-04
  Filled 2013-07-03: qty 250

## 2013-07-03 MED ORDER — PANTOPRAZOLE SODIUM 40 MG IV SOLR
40.0000 mg | INTRAVENOUS | Status: DC
Start: 2013-07-03 — End: 2013-07-03
  Filled 2013-07-03: qty 40

## 2013-07-03 MED ORDER — POTASSIUM CHLORIDE 10 MEQ/50ML IV SOLN
10.0000 meq | INTRAVENOUS | Status: AC
  Administered 2013-07-03 (×4): 10 meq via INTRAVENOUS
  Filled 2013-07-03 (×4): qty 50

## 2013-07-03 MED ORDER — SODIUM CHLORIDE 0.9 % IV SOLN
INTRAVENOUS | Status: DC
  Administered 2013-07-03: 18:00:00 via INTRAVENOUS

## 2013-07-03 MED ORDER — FENTANYL CITRATE 0.05 MG/ML IJ SOLN
25.0000 ug | INTRAMUSCULAR | Status: DC | PRN
  Administered 2013-07-03 (×3): 50 ug via INTRAVENOUS
  Administered 2013-07-04 (×2): 100 ug via INTRAVENOUS
  Filled 2013-07-03 (×6): qty 2

## 2013-07-03 NOTE — Progress Notes (Signed)
PULMONARY / CRITICAL CARE MEDICINE   Name: [name] Cabell MRN: [id number] DOB: 01/29/1933    ADMISSION DATE:  07/01/2013 CONSULTATION DATE:  5./1/15  REFERRING MD :  Dr Derrell Lollingamirez PRIMARY SERVICE: [facility] >> PCCM  CHIEF COMPLAINT:  Abdominal pain REASON FOR CONSULTATION: Vent Management, Severe Sepsis  BRIEF PATIENT DESCRIPTION:  80 y/o chronically ill woman with [facility]  She is s/p Trach and PEG in aftermath of gastroenteritis + ARDS, retroperitoneal bleed, pneumothorax; then PNA (with MDR pseudomonas), septic and hemorrhagic shock. She was weaned to ATC at [facility] and was discharged to SNF for further care. Her other chronic medical problems > Heart block s/p pacer placement (100% paced), hypothyroidism, DM, A Fib, CKD. She was admitted with fever, abdominal pain and erythema of her abdominal wall 5/1. Was found to have subcutaneous tissue infection due to malpositioned PEG requiring significant debridement 5/1. She returned to ICU on MV, hemodynamically stable.   SIGNIFICANT EVENTS / STUDIES:  5/01 - CT abd/pelvis: extensive gas in sq tissue consistent with infection, PEG tube out of stomach and into the subcutaneous fat, improved iliopsoas hematoma, possible cholelithiasis, L adrenal mass, small RLL infiltrate 5/01 - OR for debridement and irrigation of abd wall, placement of Fitzgerald tube, sub Q drain placement 5/03 - leaking PEG tube, NPO  LINES / TUBES: Trach 2/18  >>  R femoral CVL 5/01 >>   CULTURES: MRSA PCR 5/01 >> NEG Resp 5/01 >>  Urine 5/01 >>GNR >> Blood 5/01  >>    ANTIBIOTICS: Vancomycin 5/1 >>  Pip/tazo 5/1 >>   SUBJECTIVE: RN reports pt PEG tube malfunctioning, TF on hold.  Family with multiple concerns regarding feeding, PEG tube, pacemaker, pain meds  VITAL SIGNS: Temp:  [98.5 F (36.9 C)-99.2 F (37.3 C)] 98.5 F (36.9 C) (05/03 0729) Pulse Rate:  [70-86] 70 (05/03  1200) Resp:  [14-24] 24 (05/03 0805) BP: (84-160)/(31-89) 108/46 mmHg (05/03 1200) SpO2:  [92 %-100 %] 100 % (05/03 1200) FiO2 (%):  [30 %-40 %] 35 % (05/03 1200) Weight:  [158 lb 4.6 oz (71.8 kg)] 158 lb 4.6 oz (71.8 kg) (05/03 0600)  VENTILATOR SETTINGS: Vent Mode:  [-] PSV FiO2 (%):  [30 %-40 %] 35 % Set Rate:  [14 bmp] 14 bmp Vt Set:  [500 mL] 500 mL PEEP:  [5 cmH20] 5 cmH20 Pressure Support:  [12 cmH20-14 cmH20] 12 cmH20 Plateau Pressure:  [19 cmH20] 19 cmH20  INTAKE / OUTPUT: Intake/Output     05/02 0701 - 05/03 0700 05/03 0701 - 05/04 0700   I.V. (mL/kg) 1102.2 (15.4) 147.6 (2.1)   Other  20   NG/GT 407.7 44.2   IV Piggyback 412.5 175   Total Intake(mL/kg) 1922.4 (26.8) 386.8 (5.4)   Urine (mL/kg/hr) 735 (0.4) 170 (0.4)   Total Output 735 170   Net +1187.4 +216.8        Stool Occurrence 1 x      PHYSICAL EXAMINATION: General:  Chronically critically ill in NAD on vent Neuro:  No focal deficits HEENT:  WNL Cardiovascular:  Regular, paced, no M Lungs:  Clear bilaterally Abdomen:  Large abd dressing c/d/i, no BS heard, tender to touch Ext:  No edema, warm No rash  LABS: I have reviewed all of today's lab results. Relevant abnormalities are discussed in the A/P section  CXR: 5/3 - mild left basilar atx  ASSESSMENT / PLAN:  PULMONARY A:  Acute on  chronic respiratory failure, now VDRF post-operatively Tracheostomy status Hx HCAP 2-05/2013 Hx pneumothorax 2-05/2013 P:   Vent support as needed, goal ATC Cont vent bundle Wean in PSV > ATC as tolerated PRN CXR  CARDIOVASCULAR A:  CAF Complete heart block s/p pacer - per family, pacemaker battery is to be replaced soon Hx cardiac arrest 2-05/2013 due to mucous plugging P:  Monitor rhythm and BP Will need to discuss with Cardiology patients ability to have new pacer battery placed  RENAL A:   Acute renal insufficiency (baseline Cr 0.78), improving P:   Monitor BMET intermittently Monitor I/Os Correct  electrolytes as indicated D5 1/2 NS with KCL @ 50 ml/hr    GASTROINTESTINAL A:   Malpositioned PEG - s/p replacement 5/01, repeat leaking on 5/3, made NPO Severe ventral subcutaneous infection, s/p debridement 5/01 Protein-cal malnutrition  P:   SUP: enteral famotidine NPO Insert PICC line per IR for TNA  Fitzgerald-Tube to gravity drainage per CCS ? If will need surgical debridement & closure of gastrostomy site  HEMATOLOGIC A:   Mild anemia without acute blood loss Hx of retroperitoneal bleed 2-05/2013 P:  DVT px: SQ heparin Monitor CBC intermittently Transfuse per usual ICU guidelines  INFECTIOUS A:   Severe Sepsis Subcutaneous abdominal tissue infection, debrided 5/1 UTI with chronic foley  P:   Micro and abx as above Follow UC with GNR   ENDOCRINE A:   DM At Risk Hypoglycemia (NPO) Hypothyroidism   P:   Change levothyroxine to enteral Cont SSI D5 1/2 NS with KCL @ 50 ml/hr  Holding Levemir  NEUROLOGIC A:   Hx anoxic brain injury  Post op pain Hx chronic clonazepam use ICU associated agitation P:   Cont fentanyl patch + PRN fentanyl IV Has been on clonazepam since [facility] admit, will transition to ativan IV as pt's PEG tube is malfuctioning and can not take PT   GLOBAL: 5/3 Updated patients family regarding current status & plan of care.  Time spent > 15 minutes They continue to desire full code.   Canary Brim, NP-C [facility] Pgr: [phone] or [phone]   I have personally obtained a history, examined the patient, evaluated laboratory and imaging results, formulated the assessment and plan and placed orders  CRITICAL CARE: The patient is critically ill with multiple organ systems failure and requires high complexity decision making for assessment and support, frequent evaluation and titration of therapies, application of advanced monitoring technologies and extensive interpretation of multiple databases. Critical Care Time devoted to  patient care services described in this note is 35 minutes.   Billy Fischer, MD ; [facility] [phone].  After 5:30 PM or weekends, call [phone]

## 2013-07-03 NOTE — Progress Notes (Signed)
CRITICAL VALUE ALERT  Critical value received:  Vanc trough 29.4  Date of notification:  07/03/2013  Time of notification:  1342  Critical value read back:yes  Nurse who received alert:  Leonia Reeves  MD notified (1st page):  May Bell, Pharmacist   Time of first page:  1345  MD notified (2nd page):  Time of second page:  Responding MD:  Raylea Friar, Pharmacist  Time MD responded:  [phone]

## 2013-07-03 NOTE — Progress Notes (Signed)
PARENTERAL NUTRITION CONSULT NOTE - INITIAL  Pharmacy Consult for TPN Indication: gastric contents leaking around G-tube.   Allergies  Allergen Reactions  . Beta Adrenergic Blockers Other (See Comments)    Not working well for pt.  [name] Hcl] Shortness Of Breath  . Carvedilol Shortness Of Breath    Chest  Discomfort.  . Dapsone Other (See Comments)    Kidney  failure  . Diclofenac Sodium Other (See Comments)    Causes  Fluid  Retention.  [name] [Pitavastatin Calcium] Shortness Of Breath  . Methotrexate Shortness Of Breath    Mouth sores.  . Metoprolol Succinate Hives  . Oxaprozin Diarrhea  . Other     Fragrance mixtures -  Cinol botanicals  &  Plants oil. Cocamidoproy   -   Betaine. Lipstick & other makeup & cleaning materials.   [name] Epinephrine     Patient Measurements: Height: 5' 2.99" (160 cm) Weight: 158 lb 4.6 oz (71.8 kg) IBW/kg (Calculated) : 52.38 Adjusted Body Weight: 58.2 kg  Vital Signs: Temp: 98.5 F (36.9 C) (05/03 0729) Temp src: Oral (05/03 0729) BP: 122/50 mmHg (05/03 0805) Pulse Rate: 74 (05/03 0800) Intake/Output from previous day: 05/02 0701 - 05/03 0700 In: 1804 [I.V.:1036.3; NG/GT:367.7; IV Piggyback:400] Out: 735 [Urine:735] Intake/Output from this shift:    Labs:  Recent Labs  07/01/13 1424 07/01/13 2037 07/02/13 0400 07/03/13 0325  WBC  --  14.5* 14.3* 14.2*  HGB  --  10.2* 9.4* 9.1*  HCT  --  33.1* 30.2* 29.8*  PLT  --  152 214 183  INR 1.45  --   --   --      Recent Labs  07/01/13 0257  07/01/13 2037 07/02/13 0400 07/02/13 1642 07/03/13 0325  NA 142  < > 143 144  --  146  K 4.1  < > 3.7 3.7  --  3.3*  CL 97  < > 102 105  --  107  CO2 27  --  29 27  --  25  GLUCOSE 194*  < > 104* 120*  --  117*  BUN 41*  < > 33* 34*  --  30*  CREATININE 1.13*  < > 1.01 1.05  --  0.90  CALCIUM 9.6  --  8.4 8.1*  --  8.3*  MG  --   --  1.9  --  2.0 2.0  PHOS  --   --  4.1  --  3.9 3.1  PROT 7.0  --   --  5.4*  --   5.4*  ALBUMIN 2.4*  --   --  1.7*  --  1.6*  AST 24  --   --  15  --  12  ALT 20  --   --  13  --  12  ALKPHOS 89  --   --  67  --  74  BILITOT 0.5  --   --  0.6  --  0.3  < > = values in this interval not displayed. Estimated Creatinine Clearance: 48.2 ml/min (by C-G formula based on Cr of 0.9).    Recent Labs  07/02/13 2358 07/03/13 0406 07/03/13 0718  GLUCAP 128* 118* 177*    Medical History: Past Medical History  Diagnosis Date  . Hypothyroid   . Complete heart block 2002    Initial PPM 2002 with upgrade by JA to CRT-P 11/21/08  . Nonischemic cardiomyopathy   . Persistent atrial fibrillation   . Hypertension   .  Hyperlipidemia   . Hives 4/10  . Cataract 4/13    right  . Renal failure     3rd stage-seeing neurologist  . Cardiac arrest     Medications:  Prescriptions prior to admission  Medication Sig Dispense Refill  . Amino Acids-Protein Hydrolys (FEEDING SUPPLEMENT, PRO-STAT SUGAR FREE 64,) LIQD Take 30 mLs by mouth 3 (three) times daily with meals.      . clonazePAM (KLONOPIN) 0.5 MG tablet Take one tablet by mouth every 8 hours for anxiety  90 tablet  5  . diphenoxylate-atropine (LOMOTIL) 2.5-0.025 MG per tablet Take one tablet by mouth four times daily as needed for diarrhea  120 tablet  0  . famotidine (PEPCID) 20 MG tablet Take 20 mg by mouth 2 (two) times daily.      . fentaNYL (DURAGESIC - DOSED MCG/HR) 50 MCG/HR Remove old patch and then apply one patch topically every 72 hours. Rotate sites  10 patch  0  . furosemide (LASIX) 40 MG tablet Take 40 mg by mouth 2 (two) times daily.      . insulin aspart (NOVOLOG) 100 UNIT/ML injection Inject 3 Units into the skin once. For CBG over 150      . insulin detemir (LEVEMIR) 100 UNIT/ML injection Inject 12 Units into the skin at bedtime.      . lactobacillus (FLORANEX/LACTINEX) PACK Take 1 g by mouth 2 (two) times daily.      [name]. levothyroxine (SYNTHROID, LEVOTHROID) 100 MCG tablet Take 100 mcg by mouth daily before  breakfast.      . LORazepam (ATIVAN) 1 MG tablet Take one tablet by mouth or per tube every 6 hours as needed for anxiety  120 tablet  5  . Nutritional Supplements (GLUCERNA 1.5 CAL PO) Take 1 Can by mouth 3 (three) times daily. If patient consumes less than 50% of meals      . potassium chloride SA (K-DUR,KLOR-CON) 20 MEQ tablet Take 20 mEq by mouth daily.      . sertraline (ZOLOFT) 25 MG tablet Take 75 mg by mouth daily.         Insulin Requirements in the past 24 hours:  On levemir 12 qhs PTA, on SSI currently  Current Nutrition:  Vital HP at 20 ml/hr stopped due to leaking G-tube, NPO  GI: Abd: Wound with exposed penrose drains and gastric contents still leaking around G-tube into the wound. Tube feedings in wound. May need surgery again for debridement and closure of gastrostomy site. PICC line and TPN ordered 5/3.   Lytes: k 3.3, na 146. Phos 3.1, mag 2  Renal: creat 0.9, has 45ns 20 K at 50 ml/hr, UOP 0.4 ml/kg/hr  Assessment: Pharmacy consulted to start TPN in this 80 yo F due to G-tube leaking into wound.    Nutritional Goals: per RD note 5/1 1450 - 1650 kCal, 85 - 100 gm protein per day  PICC: 5/3 for TPN  TPN day # 1  Plan:  1. Clinimix E 5-15 at 40 ml/hr plus lipids at 10 ml/hr to provide 8 gm protein and 1162 kcals and f/u tolerance 2. Goal rate of 70 ml/hr will provide 84 gm protein and 1673 kcals which will provide 100% support 3. 4 runs of K, TPN labs in AM 4. Add 15 units of insulin to TPN and continue SSI and CBGs q4h 5. Add pepcid 40 mg per bag to TPN, she was previously on pepcid 20 per tube bid 6. MVI daily and trace elements MWF  due to shortage 7. DC 45NSw/ 20 K at 1800 and start NS at [facility] when new TPN started  [name], Pharm.D. [phone] 07/03/2013 8:59 AM

## 2013-07-03 NOTE — Progress Notes (Signed)
ANTIBIOTIC CONSULT NOTE - Follow-Up  Pharmacy Consult for Vancomycin/Zosyn Indication: rule out sepsis  Allergies  Allergen Reactions  . Beta Adrenergic Blockers Other (See Comments)    Not working well for pt.  Sherrie Mustache Hcl] Shortness Of Breath  . Carvedilol Shortness Of Breath    Chest  Discomfort.  . Dapsone Other (See Comments)    Kidney  failure  . Diclofenac Sodium Other (See Comments)    Causes  Fluid  Retention.  Consuelo Pandy [Pitavastatin Calcium] Shortness Of Breath  . Methotrexate Shortness Of Breath    Mouth sores.  . Metoprolol Succinate Hives  . Oxaprozin Diarrhea  . Other     Fragrance mixtures -  Cinol botanicals  &  Plants oil. Cocamidoproy   -   Betaine. Lipstick & other makeup & cleaning materials.   Marland Kitchen Epinephrine     Patient Measurements: 78.6 kg  Vital Signs: Temp: 98.5 F (36.9 C) (05/03 0729) Temp src: Oral (05/03 0729) BP: 130/58 mmHg (05/03 1500) Pulse Rate: 91 (05/03 1500)  Labs:  Recent Labs  07/01/13 2037 07/02/13 0400 07/03/13 0325  WBC 14.5* 14.3* 14.2*  HGB 10.2* 9.4* 9.1*  PLT 152 214 183  CREATININE 1.01 1.05 0.90   Vancomycin trough level = 29.4  Medical History: Past Medical History  Diagnosis Date  . Hypothyroid   . Complete heart block 2002    Initial PPM 2002 with upgrade by JA to CRT-P 11/21/08  . Nonischemic cardiomyopathy   . Persistent atrial fibrillation   . Hypertension   . Hyperlipidemia   . Hives 4/10  . Cataract 4/13    right  . Renal failure     3rd stage-seeing neurologist  . Cardiac arrest    Assessment: 80 y/o F from [place] with fever, WBC 18.9, lactic acid 4.27, slight bump in Scr to 1.30, other labs as above. To start broad spectrum antibiotics for r/o sepsis.   Vancomycin trough level 29.4.  Renal function appears fairly stable, with CrCl ~ 50 ml/min.  Goal of Therapy:  Vancomycin trough level 15-20 mcg/ml  Plan:   -Vancomycin 750 mg IV q18h.  Will recheck levels at steady  state. -Continue Zosyn 3.375G IV q8h to be infused over 4 hours  Tad Moore, BCPS  Clinical Pharmacist Pager [phone]  07/03/2013 3:55 PM

## 2013-07-03 NOTE — Progress Notes (Signed)
[facility] Progress Note 2 Days Post-Op  Subjective: Patient agitated on the ventilator.  On contact precautions..  Getting tube feedings through G-tube.  Objective: Vital signs in last 24 hours: Temp:  [97.8 F (36.6 C)-99.2 F (37.3 C)] 98.5 F (36.9 C) (05/03 0729) Pulse Rate:  [70-86] 70 (05/03 0700) Resp:  [14-22] 15 (05/03 0700) BP: (84-154)/(31-89) 128/47 mmHg (05/03 0700) SpO2:  [92 %-100 %] 100 % (05/03 0700) FiO2 (%):  [30 %-40 %] 30 % (05/03 0700) Weight:  [71.8 kg (158 lb 4.6 oz)] 71.8 kg (158 lb 4.6 oz) (05/03 0600) Last BM Date: 07/01/13  Intake/Output from previous day: 05/02 0701 - 05/03 0700 In: 1804 [I.V.:1036.3; NG/GT:367.7; IV Piggyback:400] Out: 735 [Urine:735] Intake/Output this shift:    General: Agitated, but communicative.  Seems to be hurting in her abdomen  Lungs: Clear  Abd: Wound with exposed penrose drains and gastric contents still leaking around G-tube into the wound.  Tube feedings in wound.  Extremities: No changes  Neuro: Unchanged.  Lab Results:  @LABLAST2 (wbc:2,hgb:2,hct:2,plt:2) BMET  Recent Labs  07/02/13 0400 07/03/13 0325  NA 144 146  K 3.7 3.3*  CL 105 107  CO2 27 25  GLUCOSE 120* 117*  BUN 34* 30*  CREATININE 1.05 0.90  CALCIUM 8.1* 8.3*   PT/INR  Recent Labs  07/01/13 1424  LABPROT 17.3*  INR 1.45   ABG  Recent Labs  07/01/13 2050  PHART 7.453*  HCO3 28.8*    Studies/Results: Dg Abd 1 View  07/01/2013   CLINICAL DATA:  G-tube placement.  EXAM: ABDOMEN - 1 VIEW; DG C-ARM 1-60 MIN  COMPARISON:  None.  FINDINGS: Multiple C arm views demonstrate contrast injection through the gastrostomy tube with the contrast in the stomach and extending into the duodenum.  IMPRESSION: G-tube in the stomach.   Electronically Signed   By: Gordan PaymentSteve  Reid M.D.   On: 07/01/2013 17:20   Ct Abdomen Pelvis W Contrast  07/01/2013   CLINICAL DATA:  Abdominal wall infection question deep tissue infection, abdominal pain, fever,  history non ischemic cardiomyopathy, atrial fibrillation, hypertension ; had a retroperitoneal hematoma in March 2015  EXAM: CT ABDOMEN AND PELVIS WITH CONTRAST  TECHNIQUE: Multidetector CT imaging of the abdomen and pelvis was performed using the standard protocol following bolus administration of intravenous contrast. Sagittal and coronal MPR images reconstructed from axial data set.  CONTRAST:  80mL OMNIPAQUE IOHEXOL 300 MG/ML SOLN IV. Dilute oral contrast.  COMPARISON:  05/12/2013  FINDINGS: Small bibasilar pleural effusions and atelectasis.  Focus of infiltrate centrally in RIGHT lower lobe.  Pacemaker leads RIGHT atrium, RIGHT ventricle and coronary sinus.  Enlargement of cardiac chambers.  Distended gallbladder with dependent density which could represent milk-of-calcium or tiny layering stones.  No definite gallbladder wall thickening or pericholecystic edema by CT.  Liver, spleen, and pancreas normal appearance.  Beam hardening artifacts from patient's RIGHT arm.  No definite focal renal abnormalities within limitations of artifacts.  LEFT adrenal mass with indeterminate attenuation 2.3 x 2.1 cm image 26 previously 2.2 x 1.8 cm by my re-measurement.  Extensive subcutaneous gas and edema throughout the anterior abdominal wall from the subxiphoid region to the umbilicus compatible with soft tissue infection by a gas-forming organism.  Bumper of a gastrostomy tube is located external to the anterior abdominal wall fascia external to the stomach and abdominal cavity.  Observed anterior abdominal wall gas extends down to the external oblique fascia without definite extension into the abdominal wall muscular planes.  No intra-abdominal  extension of gas is identified.  Significant interval decrease in size of previously seen LEFT iliopsoas hematoma.  Area of low attenuation within LEFT psoas 2.2 x 1.9 x ~4.9 cm may represent a liquified hematoma.  No foci of gas are seen within the question to suggest infection,  though infection cannot be excluded by CT.  Foley catheter decompresses urinary bladder.  Small LEFT ovarian cysts.  Stomach and bowel loops normal appearance.  No additional mass, adenopathy, free fluid, or free air.  Atherosclerotic calcifications without aneurysm.  Degenerative disc disease changes at L4-L5 and L3-L4 with osseous demineralization noted.  IMPRESSION: Extensive gas in subcutaneous edema within the anterior abdominal wall compatible with infection by gas-forming organism.  Bumper of gastrostomy tube is located external to the stomach, superficial to the anterior abdominal wall fascia within the subcutaneous fat, recommend removal and replacement.  Interval decrease in size of LEFT iliopsoas hematoma since previous exam, now containing an area of central low attenuation likely a liquified hematoma though infection is never excluded by CT.  Cholelithiasis questioned.  LEFT adrenal mass again identified.  Minimal RIGHT lower lobe infiltrate.  Findings called to Dr. Marolyn Haller on 07/01/2013 at 1234 hours.   Electronically Signed   By: Ulyses Southward M.D.   On: 07/01/2013 12:46   Dg Chest Port 1 View  07/01/2013   CLINICAL DATA:  Postop  EXAM: PORTABLE CHEST - 1 VIEW  COMPARISON:  07/01/2013 at 0307 hr  FINDINGS: Mild interstitial edema, new. Suspected layering small left pleural effusion. No pneumothorax.  Tracheostomy in satisfactory position at the thoracic inlet.  The heart is top-normal in size.  Left subclavian pacemaker.  IMPRESSION: Mild interstitial edema with suspected small left pleural effusion, new.   Electronically Signed   By: Charline Bills M.D.   On: 07/01/2013 19:08   Dg C-arm 1-60 Min  07/01/2013   CLINICAL DATA:  G-tube placement.  EXAM: ABDOMEN - 1 VIEW; DG C-ARM 1-60 MIN  COMPARISON:  None.  FINDINGS: Multiple C arm views demonstrate contrast injection through the gastrostomy tube with the contrast in the stomach and extending into the duodenum.  IMPRESSION: G-tube in the stomach.    Electronically Signed   By: Gordan Payment M.D.   On: 07/01/2013 17:20    Anti-infectives: Anti-infectives   Start     Dose/Rate Route Frequency Ordered Stop   07/01/13 1500  vancomycin (VANCOCIN) IVPB 750 mg/150 ml premix     750 mg 150 mL/hr over 60 Minutes Intravenous Every 12 hours 07/01/13 0508     07/01/13 1000  piperacillin-tazobactam (ZOSYN) IVPB 3.375 g     3.375 g 12.5 mL/hr over 240 Minutes Intravenous 3 times per day 07/01/13 0508     07/01/13 0300  piperacillin-tazobactam (ZOSYN) IVPB 3.375 g     3.375 g 100 mL/hr over 30 Minutes Intravenous  Once 07/01/13 0258 07/01/13 0355   07/01/13 0300  vancomycin (VANCOCIN) IVPB 1000 mg/200 mL premix     1,000 mg 200 mL/hr over 60 Minutes Intravenous  Once 07/01/13 0258 07/01/13 0413      Assessment/Plan: s/p Procedure(s): DEBRIDEMENT OF ABDOMINAL WALL ABSCESS, FEEDING TUBE PLACEMENT Continue ABX therapy due to Post-op infection Leaking G-tube into wound. Stop tube feedings. G-tube to gravity drainage. Will need PICC line and TPN started. May n eed surgery again for debridement and closure of gastrostomy site.  LOS: 2 days   Marta Lamas. Gae Bon, MD, FACS [phone] [facility] 07/03/2013

## 2013-07-03 NOTE — Consult Note (Signed)
WOC wound consult note Reason for Consult:Assess bilateral heels. Areas assessed with spouse and daughter in room.  Daughter states that this as a small problem last week at the LTC facility and that she tried to elevate heel (left) at that time.  Wound has progressed/evolved. Wound type:Pressure Pressure Ulcer POA: Yes Measurement:Rigth heel is intact, but with sluggish reperfusion post capillary refill test. Left with sDTI of approximately 7 days duration measuring 3.5cm x 3cm.  No depth, but dark purple center is evident. Wound bed:As described above. Drainage (amount, consistency, odor) None Periwound:intact Dressing procedure/placement/frequency: Soft silicone dressings placed both the the left heel sDTI and the right heel (at high risk) areas. Both feet are to be placed in the pressure redistribution boot (Prevalon). Education provided to family that while ulcer appears to be worsening, it is typically that the damage is done and then the wound damage reveals itself over the course of a week or longer.  We will employ a topical dressing (the soft silicone foam) as well as pressure redistribution via the Prevalon boot (which also corrects for lateral or medial rotation and prevents foot drop). WOC nursing team will not follow routinely, but will remain available to this patient, the nursing and medical teams.  Please re-consult if needed in-between visits. Thanks, Ladona Mow, MSN, RN, GNP, [place], CWON-AP [phone])

## 2013-07-04 ENCOUNTER — Inpatient Hospital Stay (HOSPITAL_COMMUNITY): Payer: Medicare Other

## 2013-07-04 LAB — CBC
HCT: 29.2 % — ABNORMAL LOW (ref 36.0–46.0)
HEMOGLOBIN: 8.9 g/dL — AB (ref 12.0–15.0)
MCH: 28.9 pg (ref 26.0–34.0)
MCHC: 30.5 g/dL (ref 30.0–36.0)
MCV: 94.8 fL (ref 78.0–100.0)
Platelets: 257 10*3/uL (ref 150–400)
RBC: 3.08 MIL/uL — AB (ref 3.87–5.11)
RDW: 16.6 % — ABNORMAL HIGH (ref 11.5–15.5)
WBC: 13.7 10*3/uL — AB (ref 4.0–10.5)

## 2013-07-04 LAB — COMPREHENSIVE METABOLIC PANEL
ALK PHOS: 97 U/L (ref 39–117)
ALT: 12 U/L (ref 0–35)
AST: 15 U/L (ref 0–37)
Albumin: 1.6 g/dL — ABNORMAL LOW (ref 3.5–5.2)
BILIRUBIN TOTAL: 0.4 mg/dL (ref 0.3–1.2)
BUN: 19 mg/dL (ref 6–23)
CHLORIDE: 108 meq/L (ref 96–112)
CO2: 24 meq/L (ref 19–32)
Calcium: 8.4 mg/dL (ref 8.4–10.5)
Creatinine, Ser: 0.84 mg/dL (ref 0.50–1.10)
GFR calc non Af Amer: 64 mL/min — ABNORMAL LOW (ref >90)
GFR, EST AFRICAN AMERICAN: 75 mL/min — AB (ref >90)
GLUCOSE: 92 mg/dL (ref 70–99)
POTASSIUM: 3.8 meq/L (ref 3.7–5.3)
SODIUM: 145 meq/L (ref 137–147)
Total Protein: 5.4 g/dL — ABNORMAL LOW (ref 6.0–8.3)

## 2013-07-04 LAB — PHOSPHORUS: Phosphorus: 2.5 mg/dL (ref 2.3–4.6)

## 2013-07-04 LAB — CULTURE, RESPIRATORY W GRAM STAIN

## 2013-07-04 LAB — GLUCOSE, CAPILLARY
GLUCOSE-CAPILLARY: 104 mg/dL — AB (ref 70–99)
GLUCOSE-CAPILLARY: 119 mg/dL — AB (ref 70–99)
GLUCOSE-CAPILLARY: 94 mg/dL (ref 70–99)
GLUCOSE-CAPILLARY: 97 mg/dL (ref 70–99)
Glucose-Capillary: 110 mg/dL — ABNORMAL HIGH (ref 70–99)
Glucose-Capillary: 111 mg/dL — ABNORMAL HIGH (ref 70–99)
Glucose-Capillary: 87 mg/dL (ref 70–99)

## 2013-07-04 LAB — URINE CULTURE: Colony Count: >=100000

## 2013-07-04 LAB — PREALBUMIN: PREALBUMIN: 4 mg/dL — AB (ref 17.0–34.0)

## 2013-07-04 LAB — CULTURE, RESPIRATORY

## 2013-07-04 LAB — MAGNESIUM: Magnesium: 1.8 mg/dL (ref 1.5–2.5)

## 2013-07-04 LAB — TRIGLYCERIDES: Triglycerides: 244 mg/dL — ABNORMAL HIGH (ref <150)

## 2013-07-04 MED ORDER — TRACE MINERALS CR-CU-F-FE-I-MN-MO-SE-ZN IV SOLN
INTRAVENOUS | Status: DC
  Filled 2013-07-04: qty 2000

## 2013-07-04 MED ORDER — KCL IN DEXTROSE-NACL 30-5-0.45 MEQ/L-%-% IV SOLN: INTRAVENOUS | Status: DC

## 2013-07-04 MED ORDER — FENTANYL CITRATE 0.05 MG/ML IJ SOLN
25.0000 ug | INTRAMUSCULAR | Status: DC | PRN
  Administered 2013-07-04 – 2013-07-05 (×5): 50 ug via INTRAVENOUS
  Filled 2013-07-04 (×4): qty 2

## 2013-07-04 MED ORDER — LORAZEPAM BOLUS VIA INFUSION: 2.0000 mg | INTRAVENOUS | Status: DC | PRN

## 2013-07-04 MED ORDER — FENTANYL BOLUS VIA INFUSION: 50.0000 ug | INTRAVENOUS | Status: DC | PRN

## 2013-07-04 MED ORDER — FAMOTIDINE IN NACL 20-0.9 MG/50ML-% IV SOLN
20.0000 mg | Freq: Two times a day (BID) | INTRAVENOUS | Status: AC
  Administered 2013-07-04: 20 mg via INTRAVENOUS
  Filled 2013-07-04: qty 50

## 2013-07-04 MED ORDER — TRACE MINERALS CR-CU-F-FE-I-MN-MO-SE-ZN IV SOLN
INTRAVENOUS | Status: AC
  Administered 2013-07-04: 17:00:00 via INTRAVENOUS
  Filled 2013-07-04: qty 1000

## 2013-07-04 MED ORDER — FUROSEMIDE 10 MG/ML IJ SOLN
40.0000 mg | Freq: Once | INTRAMUSCULAR | Status: AC
  Administered 2013-07-04: 40 mg via INTRAVENOUS
  Filled 2013-07-04: qty 4

## 2013-07-04 MED ORDER — POTASSIUM CHLORIDE 10 MEQ/50ML IV SOLN
10.0000 meq | INTRAVENOUS | Status: AC
  Administered 2013-07-04 (×4): 10 meq via INTRAVENOUS
  Filled 2013-07-04 (×4): qty 50

## 2013-07-04 MED ORDER — ENOXAPARIN SODIUM 40 MG/0.4ML ~~LOC~~ SOLN
40.0000 mg | SUBCUTANEOUS | Status: DC
Start: 2013-07-04 — End: 2013-07-08
  Administered 2013-07-04 – 2013-07-08 (×4): 40 mg via SUBCUTANEOUS
  Filled 2013-07-04 (×5): qty 0.4

## 2013-07-04 MED ORDER — FAT EMULSION 20 % IV EMUL
250.0000 mL | INTRAVENOUS | Status: AC
  Administered 2013-07-04: 250 mL via INTRAVENOUS
  Filled 2013-07-04: qty 250

## 2013-07-04 MED ORDER — FAMOTIDINE IN NACL 20-0.9 MG/50ML-% IV SOLN: 20.0000 mg | Freq: Two times a day (BID) | INTRAVENOUS | Status: DC

## 2013-07-04 MED ORDER — FAMOTIDINE IN NACL 20-0.9 MG/50ML-% IV SOLN
20.0000 mg | Freq: Two times a day (BID) | INTRAVENOUS | Status: DC
  Filled 2013-07-04: qty 50

## 2013-07-04 MED ORDER — MAGNESIUM SULFATE IN D5W 10-5 MG/ML-% IV SOLN
1.0000 g | Freq: Once | INTRAVENOUS | Status: AC
  Administered 2013-07-04: 1 g via INTRAVENOUS
  Filled 2013-07-04: qty 100

## 2013-07-04 MED ORDER — FAMOTIDINE IN NACL 20-0.9 MG/50ML-% IV SOLN
20.0000 mg | Freq: Two times a day (BID) | INTRAVENOUS | Status: DC
  Filled 2013-07-04 (×2): qty 50

## 2013-07-04 MED ORDER — SODIUM CHLORIDE 0.9 % IV SOLN: 100.0000 ug/h | INTRAVENOUS | Status: DC

## 2013-07-04 MED ORDER — FAT EMULSION 20 % IV EMUL
250.0000 mL | INTRAVENOUS | Status: DC
  Filled 2013-07-04: qty 250

## 2013-07-04 NOTE — Progress Notes (Signed)
Placed on 40% atc per protocol.  Patient tolerated well.  Will continue to monitor.

## 2013-07-04 NOTE — Progress Notes (Signed)
Patient ID: [name], female   DOB: October 21, 1932, 80 y.o.   MRN: [id number] 3 Days Post-Op  Subjective: Pt on trach collar right now.   Objective: Vital signs in last 24 hours: Temp:  [98.5 F (36.9 C)-99 F (37.2 C)] 99 F (37.2 C) (05/04 0420) Pulse Rate:  [70-96] 82 (05/04 0916) Resp:  [15-23] 22 (05/04 0916) BP: (88-146)/(34-103) 146/50 mmHg (05/04 0916) SpO2:  [99 %-100 %] 100 % (05/04 0916) FiO2 (%):  [30 %-40 %] 40 % (05/04 0916) Weight:  [159 lb 6.3 oz (72.3 kg)] 159 lb 6.3 oz (72.3 kg) (05/04 0500) Last BM Date: 07/03/13  Intake/Output from previous day: 05/03 0701 - 05/04 0700 In: 1724.9 [I.V.:1173.3; NG/GT:44.2; IV Piggyback:487.5] Out: 1065 [Urine:1065] Intake/Output this shift: Total I/O In: 62.5 [I.V.:50; IV Piggyback:12.5] Out: 200 [Urine:200]  PE: Abd: wound has some clean parts, but the edges of the wound in her subq fat have some necrosis as well.  Base is mostly clean.  Multiple penrose drains in place and PEG tube in place.    Lab Results:   Recent Labs  07/03/13 0325 07/04/13 0500  WBC 14.2* 13.7*  HGB 9.1* 8.9*  HCT 29.8* 29.2*  PLT 183 257   BMET  Recent Labs  07/03/13 0325 07/04/13 0500  NA 146 145  K 3.3* 3.8  CL 107 108  CO2 25 24  GLUCOSE 117* 92  BUN 30* 19  CREATININE 0.90 0.84  CALCIUM 8.3* 8.4   PT/INR  Recent Labs  07/01/13 1424  LABPROT 17.3*  INR 1.45   CMP     Component Value Date/Time   NA 145 07/04/2013 0500   K 3.8 07/04/2013 0500   CL 108 07/04/2013 0500   CO2 24 07/04/2013 0500   GLUCOSE 92 07/04/2013 0500   BUN 19 07/04/2013 0500   CREATININE 0.84 07/04/2013 0500   CALCIUM 8.4 07/04/2013 0500   PROT 5.4* 07/04/2013 0500   ALBUMIN 1.6* 07/04/2013 0500   AST 15 07/04/2013 0500   ALT 12 07/04/2013 0500   ALKPHOS 97 07/04/2013 0500   BILITOT 0.4 07/04/2013 0500   GFRNONAA 64* 07/04/2013 0500   GFRAA 75* 07/04/2013 0500   Lipase     Component Value Date/Time   LIPASE 110* 04/13/2013 1800       Studies/Results: Ir  Fluoro Guide Cv Line Right  07/04/2013   CLINICAL DATA:  Poor nutritional status, access for TPN  EXAM: RIGHT UPPER EXTREMITY POWER TRIPLE-LUMEN PICC LINE PLACEMENT WITH ULTRASOUND AND FLUOROSCOPIC GUIDANCE  FLUOROSCOPY TIME:  12 SECONDS  PROCEDURE: The patient was advised of the possible risks andcomplications and agreed to undergo the procedure. The patient was then brought to the angiographic suite for the procedure.  The RIGHT arm was prepped with chlorhexidine, drapedin the usual sterile fashion using maximum barrier technique (cap and mask, sterile gown, sterile gloves, large sterile sheet, hand hygiene and cutaneous antisepsis) and infiltrated locally with 1% Lidocaine.  Ultrasound demonstrated patency of the RIGHT BRACHIAL vein, and this was documented with an image. Under real-time ultrasound guidance, this vein was accessed with a 21 gauge micropuncture needle and image documentation was performed. A 0.018 wire was introduced in to the vein. Over this, a 5 [place] TRIPLE lumen POWER PICC was advanced to the lower SVC/right atrial junction. Fluoroscopy during the procedure and fluoro spot radiograph confirms appropriate catheter position. The catheter was flushed and covered with asterile dressing.  Catheter length: 40  Complications: No immediate  IMPRESSION: Successful right arm  power triple lumen PICC line placement with ultrasound and fluoroscopic guidance. The catheter is ready for use.   Electronically Signed   By: Ruel Favors M.D.   On: 07/04/2013 09:07   Ir US Guide Vasc Access Right  07/04/2013   CLINICAL DATA:  Poor nutritional status, access for TPN  EXAM: RIGHT UPPER EXTREMITY POWER TRIPLE-LUMEN PICC LINE PLACEMENT WITH ULTRASOUND AND FLUOROSCOPIC GUIDANCE  FLUOROSCOPY TIME:  12 SECONDS  PROCEDURE: The patient was advised of the possible risks andcomplications and agreed to undergo the procedure. The patient was then brought to the angiographic suite for the procedure.  The RIGHT arm was  prepped with chlorhexidine, drapedin the usual sterile fashion using maximum barrier technique (cap and mask, sterile gown, sterile gloves, large sterile sheet, hand hygiene and cutaneous antisepsis) and infiltrated locally with 1% Lidocaine.  Ultrasound demonstrated patency of the RIGHT BRACHIAL vein, and this was documented with an image. Under real-time ultrasound guidance, this vein was accessed with a 21 gauge micropuncture needle and image documentation was performed. A 0.018 wire was introduced in to the vein. Over this, a 5 [place] TRIPLE lumen POWER PICC was advanced to the lower SVC/right atrial junction. Fluoroscopy during the procedure and fluoro spot radiograph confirms appropriate catheter position. The catheter was flushed and covered with asterile dressing.  Catheter length: 40  Complications: No immediate  IMPRESSION: Successful right arm power triple lumen PICC line placement with ultrasound and fluoroscopic guidance. The catheter is ready for use.   Electronically Signed   By: Ruel Favors M.D.   On: 07/04/2013 09:07   Dg Chest Port 1 View  07/04/2013   CLINICAL DATA:  Tracheostomy.  EXAM: PORTABLE CHEST - 1 VIEW  COMPARISON:  DG CHEST 1V PORT dated 07/03/2013; DG CHEST 1V PORT dated 07/01/2013  FINDINGS: Tracheostomy tube in good anatomic position. Cardiac pacer noted lead tips in right atrium and right ventricle. Cardiomegaly with diffuse bilateral pulmonary infiltrates consistent with congestive heart failure and pulmonary edema. No trauma pleural effusion. No pneumothorax.  IMPRESSION: 1. Tracheostomy tube in good anatomic position . 2. Interval appearance of bilateral pulmonary infiltrates consistent with pulmonary edema. Underlying pneumonia cannot be excluded particularly in the left lung base.   Electronically Signed   By: Maisie Fus  Register   On: 07/04/2013 07:21   Dg Chest Port 1 View  07/03/2013   CLINICAL DATA:  Respiratory failure.  EXAM: PORTABLE CHEST - 1 VIEW  COMPARISON:  DG CHEST  1V PORT dated 07/01/2013  FINDINGS: Cardiomegaly. Multilead pacer unchanged in position. Tracheostomy good position. Improved vascular congestion. Minimal left basilar atelectasis.  IMPRESSION: Improved aeration.  Clearing vascular congestion.   Electronically Signed   By: Davonna Belling M.D.   On: 07/03/2013 08:25    Anti-infectives: Anti-infectives   Start     Dose/Rate Route Frequency Ordered Stop   07/04/13 0400  vancomycin (VANCOCIN) IVPB 750 mg/150 ml premix     750 mg 150 mL/hr over 60 Minutes Intravenous Every 18 hours 07/03/13 1558     07/01/13 1500  vancomycin (VANCOCIN) IVPB 750 mg/150 ml premix  Status:  Discontinued     750 mg 150 mL/hr over 60 Minutes Intravenous Every 12 hours 07/01/13 0508 07/03/13 1546   07/01/13 1000  piperacillin-tazobactam (ZOSYN) IVPB 3.375 g     3.375 g 12.5 mL/hr over 240 Minutes Intravenous 3 times per day 07/01/13 0508     07/01/13 0300  piperacillin-tazobactam (ZOSYN) IVPB 3.375 g     3.375 g 100 mL/hr  over 30 Minutes Intravenous  Once 07/01/13 0258 07/01/13 0355   07/01/13 0300  vancomycin (VANCOCIN) IVPB 1000 mg/200 mL premix     1,000 mg 200 mL/hr over 60 Minutes Intravenous  Once 07/01/13 0258 07/01/13 0413       Assessment/Plan  1. POD 3, s/p debridment of abdominal wall, placement of gastric feeding tube, subcutaneous drain placement - 07/01/2013 - A. Ramirez 2. Dislodged PEG with abdominal wall cellulitis from tube feeds in subcut space 3. VDRF, now trying trach collar 4.  Complete heart block - pacer dependent 5.  History of anoxic brain injury 6.  Diabetes mellitus  Plan: 1. Cont TID dressing changes for now. Hopefully these will clean up some of the necrotic tissue. 2. Will discuss plans for PEG tube  With Dr. Ezzard StandingNewman. Will have to see how her respiratory status progresses. 3. Nutrition with TNA for now to decrease leaking around PEG into her wound.  LOS: 3 days    Letha CapeKelly E Osborne 07/04/2013, 9:30 AM Pager: [phone]  Agree with  above. Husband in room. On trach collar this AM.  Ovidio Kinavid Crystale Giannattasio, MD, [facility] Pager: [phone] Office phone:  [phone]

## 2013-07-04 NOTE — Anesthesia Postprocedure Evaluation (Signed)
  Anesthesia Post-op Note  Patient: [name]  Procedure(s) Performed: Procedure(s): DEBRIDEMENT OF ABDOMINAL WALL ABSCESS, FEEDING TUBE PLACEMENT (N/A)  Patient Location: PACU  Anesthesia Type:General  Level of Consciousness: awake  Airway and Oxygen Therapy: trach with trach mask  Post-op Pain: mild  Post-op Assessment: Post-op Vital signs reviewed, Patient's Cardiovascular Status Stable, Respiratory Function Stable, Patent Airway, No signs of Nausea or vomiting and Pain level controlled  Post-op Vital Signs: Reviewed and stable  Last Vitals:  Filed Vitals:   07/04/13 0600  BP: 128/81  Pulse: 87  Temp:   Resp:     Complications: No apparent anesthesia complications

## 2013-07-04 NOTE — Progress Notes (Addendum)
PARENTERAL NUTRITION CONSULT NOTE - INITIAL  Pharmacy Consult for TPN Indication: gastric contents leaking around G-tube.   Allergies  Allergen Reactions  . Beta Adrenergic Blockers Other (See Comments)    Not working well for pt.  Sherrie Mustache Hcl] Shortness Of Breath  . Carvedilol Shortness Of Breath    Chest  Discomfort.  . Dapsone Other (See Comments)    Kidney  failure  . Diclofenac Sodium Other (See Comments)    Causes  Fluid  Retention.  Consuelo Pandy [Pitavastatin Calcium] Shortness Of Breath  . Methotrexate Shortness Of Breath    Mouth sores.  . Metoprolol Succinate Hives  . Oxaprozin Diarrhea  . Other     Fragrance mixtures -  Cinol botanicals  &  Plants oil. Cocamidoproy   -   Betaine. Lipstick & other makeup & cleaning materials.   Marland Kitchen Epinephrine     Patient Measurements: Height: 5' 2.99" (160 cm) Weight: 159 lb 6.3 oz (72.3 kg) IBW/kg (Calculated) : 52.38 Adjusted Body Weight: 58.2 kg  Vital Signs: Temp: 99 F (37.2 C) (05/04 0420) Temp src: Oral (05/04 0420) BP: 128/81 mmHg (05/04 0600) Pulse Rate: 87 (05/04 0600) Intake/Output from previous day: 05/03 0701 - 05/04 0700 In: 1662.4 [I.V.:1123.3; NG/GT:44.2; IV Piggyback:475] Out: 1065 [Urine:1065] Intake/Output from this shift:    Labs:  Recent Labs  07/01/13 1424  07/02/13 0400 07/03/13 0325 07/04/13 0500  WBC  --   < > 14.3* 14.2* 13.7*  HGB  --   < > 9.4* 9.1* 8.9*  HCT  --   < > 30.2* 29.8* 29.2*  PLT  --   < > 214 183 257  INR 1.45  --   --   --   --   < > = values in this interval not displayed.   Recent Labs  07/02/13 0400 07/02/13 1642 07/03/13 0325 07/04/13 0500  NA 144  --  146 145  K 3.7  --  3.3* 3.8  CL 105  --  107 108  CO2 27  --  25 24  GLUCOSE 120*  --  117* 92  BUN 34*  --  30* 19  CREATININE 1.05  --  0.90 0.84  CALCIUM 8.1*  --  8.3* 8.4  MG  --  2.0 2.0 1.8  PHOS  --  3.9 3.1 2.5  PROT 5.4*  --  5.4* 5.4*  ALBUMIN 1.7*  --  1.6* 1.6*  AST 15  --  12  15  ALT 13  --  12 12  ALKPHOS 67  --  74 97  BILITOT 0.6  --  0.3 0.4  TRIG  --   --   --  244*   Estimated Creatinine Clearance: 51.8 ml/min (by C-G formula based on Cr of 0.84).    Recent Labs  07/03/13 1946 07/04/13 0003 07/04/13 0406  GLUCAP 103* 87 94     Insulin Requirements in the past 24 hours:  On levemir 12 qhs PTA, on SSI currently; cbgs well controlled  Current Nutrition:  Vital HP at 20 ml/hr stopped due to leaking G-tube on 5/3, NPO Clinimix E 5-15 at 40 ml/hr + lipids 10 ml/hr NOT started on 5/3 due to no IV access  GI: Abd: Wound with exposed penrose drains and gastric contents still leaking around G-tube into the wound. Tube feedings in wound. May need surgery again for debridement and closure of gastrostomy site. PICC line and TPN ordered 5/3. Unable to place PICC line.  PICC placed by IR 5/4 am.    Lytes: k 3.8 after 4 4 runs yesterday, na 145. Phos 2.5, mag 1.8  Renal: creat 0.84, UOP 0.6 ml/kg/hr; D545 30 k at 50 ml/hr,   Trig: 244  Assessment: Pharmacy consulted to start TPN in this 80 yo F due to G-tube leaking into wound.  Unable to start 5/3 due to unable to place PICC line.   Nutritional Goals: per RD note 5/1 1450 - 1650 kCal, 85 - 100 gm protein per day  Acpepcicess: PICC ordered  5/3 for TPN, unable to place by VAST team, placed by IR 5/4  TPN day #1  Plan:  1. Clinimix E 5-15 at 40 ml/hr plus lipids at 10 ml/hr to provide 48 gm protein and 1162 kcals and f/u tolerance 2. Goal rate is Clinimix E 5-15 at 70 ml/hr plus lipids at 10 ml/hr to provide 84 gm protein and 1673  kcals and f/u tolerance 3. 1 gm magnesium, bmet, mag, phos in am 4. Add 15 units of insulin to TPN and continue SSI and CBGs q4h 5. Add pepcid 40 mg per bag to TPN, she was previously on pepcid 20 per tube bid.  7. MVI daily and trace elements MWF due to shortage 8. DC d545 k30 when new TPN started tonight to keep IVF rate the same  Herby AbrahamMichelle T. Mae Cianci,  Pharm.D. [phone] 07/04/2013 7:10 AM

## 2013-07-04 NOTE — Progress Notes (Addendum)
PULMONARY / CRITICAL CARE MEDICINE   Name: [name] MRN: [id number] DOB: 05/11/1932    ADMISSION DATE:  07/01/2013 CONSULTATION DATE:  5./1/15  REFERRING MD :  Dr Derrell Lollingamirez PRIMARY SERVICE: [facility] >> PCCM  CHIEF COMPLAINT:  Abdominal pain REASON FOR CONSULTATION: Vent Management, Severe Sepsis  BRIEF PATIENT DESCRIPTION:  80 y/o chronically ill woman with complicated [facility].  She is s/p Trach and PEG in aftermath of gastroenteritis + ARDS, retroperitoneal bleed, pneumothorax; then PNA (with MDR pseudomonas), septic and hemorrhagic shock. She was weaned to ATC at [facility] and was discharged to SNF for further care. Her other chronic medical problems > Heart block s/p pacer placement (100% paced), hypothyroidism, DM, A Fib, CKD. She was admitted with fever, abdominal pain and erythema of her abdominal wall 5/1. Was found to have subcutaneous tissue infection due to malpositioned PEG requiring significant debridement 5/1. She returned to ICU on MV, hemodynamically stable.   SIGNIFICANT EVENTS / STUDIES:  5/01 CT abd/pelvis: extensive gas in sq tissue consistent with infection, PEG tube out of stomach and into the subcutaneous fat, improved iliopsoas hematoma, possible cholelithiasis, L adrenal mass, small RLL infiltrate 5/01 OR for debridement and irrigation of abd wall, placement of G tube, sub Q drain placement 5/03 leaking PEG tube, TFs held 5/04 PICC palced for TPN. Tolerating ATC. Off vasopressors. Mildly agitated but + F/C and interacting with husband  LINES / TUBES: R femoral CVL 5/01 >> 5/04 Trach 2/18  >>  G tube 04/2013 >>  RUE PICC 5/04 >>   CULTURES: MRSA PCR 5/01 >> NEG Resp 5/01 >> NOF Urine 5/01 >> Klebsiella (sens to Zosyn) Blood 5/01  >>    ANTIBIOTICS: Vancomycin 5/1 >> 5/04 Pip/tazo 5/1 >>   SUBJECTIVE:  Tolerating ATC. No new complaints. More responsive but mildly  agitated  VITAL SIGNS: Temp:  [98.5 F (36.9 C)-99 F (37.2 C)] 98.6 F (37 C) (05/04 1213) Pulse Rate:  [70-96] 84 (05/04 1300) Resp:  [15-22] 21 (05/04 1138) BP: (88-147)/(34-103) 102/40 mmHg (05/04 1300) SpO2:  [92 %-100 %] 100 % (05/04 1300) FiO2 (%):  [30 %-40 %] 40 % (05/04 1300) Weight:  [72.3 kg (159 lb 6.3 oz)] 72.3 kg (159 lb 6.3 oz) (05/04 0500)  VENTILATOR SETTINGS: Vent Mode:  [-] PRVC FiO2 (%):  [30 %-40 %] 40 % Set Rate:  [14 bmp] 14 bmp Vt Set:  [500 mL] 500 mL PEEP:  [5 cmH20] 5 cmH20 Pressure Support:  [12 cmH20] 12 cmH20 Plateau Pressure:  [21 cmH20-22 cmH20] 22 cmH20  INTAKE / OUTPUT: Intake/Output     05/03 0701 - 05/04 0700 05/04 0701 - 05/05 0700   I.V. (mL/kg) 1173.3 (16.2) 250 (3.5)   Other 20    NG/GT 44.2    IV Piggyback 487.5 187.5   Total Intake(mL/kg) 1724.9 (23.9) 437.5 (6.1)   Urine (mL/kg/hr) 1065 (0.6) 875 (1.6)   Total Output 1065 875   Net +659.9 -437.5        Stool Occurrence 3 x      PHYSICAL EXAMINATION: General:  Anxious, RASS +1 Neuro:  No focal deficits HEENT:  WNL Cardiovascular:  Regular, paced, no M Lungs:  Clear bilaterally Abdomen: Soft, dressing intact Ext:  No edema, warm  LABS: I have reviewed all of today's lab results. Relevant abnormalities are discussed in the A/P section  CXR: mild diffuse infiltrates c/w edema  ASSESSMENT / PLAN:  PULMONARY  A:  Acute on chronic respiratory failure Tracheostomy status P:   ATC as long as tolerated SLP eval for PMV trials Supp O2 to maintain SpO2 > 92%  CARDIOVASCULAR A:  CAF Complete heart block s/p pacer - per family, pacemaker battery is to be replaced soon Hx cardiac arrest 2-05/2013 due to mucus plugging P:  Monitor rhythm and BP Will need to discuss with Cardiology re PM battery replacement  RENAL A:   Acute renal insufficiency, resolved Mild hypervolemia P:   Monitor BMET intermittently Monitor I/Os Correct electrolytes as indicated DC  IVFs   GASTROINTESTINAL A:   Malpositioned PEG - s/p replacement 5/01, repeat leaking on 5/3 S/p debridement abd wall infection 5/01 Protein-cal malnutrition  P:   SUP: famotidine in TPN TPN to begin 5/04 G-Tube to gravity drainage per [facility] Further mgmt per [facility]  HEMATOLOGIC A:   Mild anemia without acute blood loss P:  DVT px: LMWH Monitor CBC intermittently Transfuse per usual ICU guidelines  INFECTIOUS A:   Severe Sepsis, resolving Subcutaneous abdominal wall cellulitis UTI with chronic foley  P:   Micro and abx as above  ENDOCRINE A:   DM II Hypothyroidism   P:   IV levothyroxine Cont SSI Holding Levemir  NEUROLOGIC A:   Hx anoxic brain injury  Post op pain Chronic clonazepam use ICU associated agitation P:   Cont fentanyl patch Cont PRN lorazepam, fent   GLOBAL: Husband updated @ bedside   I have personally obtained a history, examined the patient, evaluated laboratory and imaging results, formulated the assessment and plan and placed orders  CRITICAL CARE: The patient is critically ill with multiple organ systems failure and requires high complexity decision making for assessment and support, frequent evaluation and titration of therapies, application of advanced monitoring technologies and extensive interpretation of multiple databases. Critical Care Time devoted to patient care services described in this note is 35 minutes.   Billy Fischer, MD ; [facility] [phone].  After 5:30 PM or weekends, call [phone]

## 2013-07-04 NOTE — Procedures (Signed)
Successful RUE TL POWER PICC NO COMP STABLE TIP SVC/RA READY FOR USE

## 2013-07-04 NOTE — Care Management Note (Addendum)
    Page 1 of 1   07/07/2013     2:04:36 PM CARE MANAGEMENT NOTE 07/07/2013  Patient:  [name], [name]   Account Number:  [id number]  Date Initiated:  07/04/2013  Documentation initiated by:  Junius Creamer  Subjective/Objective Assessment:   adm w sepsis     Action/Plan:   from [facility], has husband, has trach   Anticipated DC Date:  07/08/2013   Anticipated DC Plan:  LONG TERM ACUTE CARE (LTAC)  In-house referral  Clinical Social Worker      DC Planning Services  CM consult      Choice offered to / List presented to:             Status of service:  In process, will continue to follow Medicare Important Message given?  YES (If response is "NO", the following Medicare IM given date fields will be blank) Date Medicare IM given:  07/07/2013 Date Additional Medicare IM given:    Discharge Disposition:    Per UR Regulation:  Reviewed for med. necessity/level of care/duration of stay  If discussed at Long Length of Stay Meetings, dates discussed:    Comments:  ContactDreena, [place] [phone]  07-07-13 1:50pm Avie Arenas, RNBSN [phone] Short on days but [facility] says yes to taking her back. Mostly on trach collar - but plugged off and desated.  Back on vent now.   Large abdominal wound which requires qd dressing changes.  on TNA with lipids.  Talked with Mr. Castellani - husband - about discharging options.  Due to level of care and back on vent now - advising [facility].  Mr. Fraleigh very happy about going back to [facility].  Physician on board for tomorrow tx to [facility].  [facility] updated. Husband updated.  IM form given, signed, copy given back - original on chart.  07-06-13 1:50pm Avie Arenas, RNBSN [phone] Remains on vent with propofol, TNA -   ?? return to [facility]???  - Will need to check days.

## 2013-07-04 NOTE — Progress Notes (Addendum)
Late entry: 07/01/2013 18:27:15 50 mcg fentanyl given to patient. 50 mcg wasted in sink in med room with [employer] RN.

## 2013-07-04 NOTE — Progress Notes (Signed)
NUTRITION CONSULT / FOLLOW UP  Intervention:   TPN per Pharmacy  RD to continue to follow nutrition care plan.   NUTRITION DIAGNOSIS:  Inadequate oral intake related to inability to eat as evidenced by NPO status; ongoing.   Goal:  Meet >/=90% of estimated nutrition needs   Monitor:  TPN initiation/tolerance, weight trends, labs   Assessment:   Patient is a 80 y.o. female with h/o pacemaker due to heart block, chronic AMS (currently at her poor baseline, presumably this is due to her history of cardiac arrest), in nursing home, chronic trach and peg. She presents to the ED with fever and tachycardia. It was noted by her SNF this evening that patient had redness extending across her abdomen surrounding the gastric feeding tube.  Per PharmD note on 5/4, Vital HP at 20 ml/hr stopped due to leaking G-tube on 5/3, NPO. Unable to start TPN on 5/3 due to unable to place PICC line  RD consulted for TPN initiation.   Clinimix 5/15 @ 4ml/ hr plus lipids at 36ml/hr currently providing 1162 kcal and 48 grams of protein. Meets 77% of estimated energy requirements and 48% of estimated protein requirements.   Goal rate of 56ml/hr plus lipids at 33ml/hr will provide 1673 kcals and 84 grams of protein. Will meet 100% of estimated energy requirements and 84% of estimated protein requirements.   Patient has been extubated and uses ventilator with trach collar if getting tired. Pt is off ventilator most of the day.   Height: Ht Readings from Last 1 Encounters:  07/03/13 5' 2.99" (1.6 m)    Weight Status:   Wt Readings from Last 1 Encounters:  07/04/13 159 lb 6.3 oz (72.3 kg)    Re-estimated needs:  Kcal: 1500 - 1700 Protein: >/= 100 g Fluid: >/= 1.8 L/day  Skin: generalized edema  Diet Order: NPO   Intake/Output Summary (Last 24 hours) at 07/04/13 0954 Last data filed at 07/04/13 0800  Gross per 24 hour  Intake 1523.15 ml  Output   1225 ml  Net 298.15 ml    Last BM:  5/3  Labs:   Recent Labs Lab 07/02/13 0400 07/02/13 1642 07/03/13 0325 07/04/13 0500  NA 144  --  146 145  K 3.7  --  3.3* 3.8  CL 105  --  107 108  CO2 27  --  25 24  BUN 34*  --  30* 19  CREATININE 1.05  --  0.90 0.84  CALCIUM 8.1*  --  8.3* 8.4  MG  --  2.0 2.0 1.8  PHOS  --  3.9 3.1 2.5  GLUCOSE 120*  --  117* 92    CBG (last 3)   Recent Labs  07/04/13 0003 07/04/13 0406 07/04/13 0749  GLUCAP 87 94 111*    Scheduled Meds: . antiseptic oral rinse  15 mL Mouth Rinse QID  . chlorhexidine  15 mL Mouth Rinse BID  . famotidine (PEPCID) IV  20 mg Intravenous Q12H  . fentaNYL  50 mcg Transdermal Q72H  . heparin  5,000 Units Subcutaneous 3 times per day  . insulin aspart  0-9 Units Subcutaneous 6 times per day  . LORazepam  0.5 mg Intravenous 3 times per day  . piperacillin-tazobactam (ZOSYN)  IV  3.375 g Intravenous 3 times per day  . vancomycin  750 mg Intravenous Q18H    Continuous Infusions: . [facility] KitchenTPN (CLINIMIX-E) Adult     And  . fat emulsion    . dexrose 5 %  and 0.45 % NaCl with KCl 30 mEq/L 50 mL/hr at 07/03/13 1800  . norepinephrine (LEVOPHED) Adult infusion Stopped (07/03/13 1300)    Eppie Gibsonebekah L Matznick, BS Dietetic Intern Pager: [phone]  I agree with student dietitian note; appropriate revisions have been made.  Joaquin CourtsKimberly Harris, RD, LDN, CNSC Pager# [phone] After Hours Pager# [phone]

## 2013-07-05 ENCOUNTER — Encounter (HOSPITAL_COMMUNITY)
Admission: EM | Disposition: A | Discharge status: Nursing Home (Includes ICF) | Payer: Self-pay | Source: Home / Self Care | Attending: Emergency Medicine

## 2013-07-05 ENCOUNTER — Inpatient Hospital Stay (HOSPITAL_COMMUNITY): Payer: Medicare Other | Admitting: Anesthesiology

## 2013-07-05 ENCOUNTER — Encounter (HOSPITAL_COMMUNITY): Payer: Medicare Other | Admitting: Anesthesiology

## 2013-07-05 ENCOUNTER — Encounter (HOSPITAL_COMMUNITY): Payer: Self-pay | Admitting: General Surgery

## 2013-07-05 HISTORY — PX: LAPAROTOMY: SHX154

## 2013-07-05 LAB — PROCALCITONIN: Procalcitonin: 0.47 ng/mL

## 2013-07-05 LAB — GLUCOSE, CAPILLARY
GLUCOSE-CAPILLARY: 138 mg/dL — AB (ref 70–99)
GLUCOSE-CAPILLARY: 145 mg/dL — AB (ref 70–99)
Glucose-Capillary: 108 mg/dL — ABNORMAL HIGH (ref 70–99)
Glucose-Capillary: 97 mg/dL (ref 70–99)
Glucose-Capillary: 158 mg/dL — ABNORMAL HIGH (ref 70–99)
Glucose-Capillary: 114 mg/dL — ABNORMAL HIGH (ref 70–99)

## 2013-07-05 LAB — BASIC METABOLIC PANEL
BUN: 16 mg/dL (ref 6–23)
CALCIUM: 8.6 mg/dL (ref 8.4–10.5)
CHLORIDE: 102 meq/L (ref 96–112)
CO2: 28 meq/L (ref 19–32)
Creatinine, Ser: 0.83 mg/dL (ref 0.50–1.10)
GFR calc Af Amer: 76 mL/min — ABNORMAL LOW (ref >90)
GFR calc non Af Amer: 65 mL/min — ABNORMAL LOW (ref >90)
Glucose, Bld: 122 mg/dL — ABNORMAL HIGH (ref 70–99)
Potassium: 3.8 mEq/L (ref 3.7–5.3)
Sodium: 141 mEq/L (ref 137–147)

## 2013-07-05 LAB — PHOSPHORUS: Phosphorus: 3.3 mg/dL (ref 2.3–4.6)

## 2013-07-05 LAB — TRIGLYCERIDES: Triglycerides: 229 mg/dL — ABNORMAL HIGH (ref <150)

## 2013-07-05 LAB — MAGNESIUM: Magnesium: 2 mg/dL (ref 1.5–2.5)

## 2013-07-05 SURGERY — LAPAROTOMY, EXPLORATORY
Anesthesia: General | Site: Abdomen

## 2013-07-05 MED ORDER — SODIUM CHLORIDE 0.9 % IV SOLN
INTRAVENOUS | Status: DC | PRN
  Administered 2013-07-05: 12:00:00 via INTRAVENOUS

## 2013-07-05 MED ORDER — PROPOFOL 10 MG/ML IV EMUL
5.0000 ug/kg/min | Freq: Once | INTRAVENOUS | Status: DC
  Administered 2013-07-05: 25 ug/kg/min via INTRAVENOUS
  Filled 2013-07-05: qty 100

## 2013-07-05 MED ORDER — PHENYLEPHRINE 40 MCG/ML (10ML) SYRINGE FOR IV PUSH (FOR BLOOD PRESSURE SUPPORT)
PREFILLED_SYRINGE | INTRAVENOUS | Status: AC
  Filled 2013-07-05: qty 10

## 2013-07-05 MED ORDER — PROPOFOL 10 MG/ML IV EMUL
0.0000 ug/kg/min | INTRAVENOUS | Status: DC
  Administered 2013-07-05 – 2013-07-06 (×2): 10 ug/kg/min via INTRAVENOUS
  Filled 2013-07-05 (×2): qty 100

## 2013-07-05 MED ORDER — ONDANSETRON HCL 4 MG/2ML IJ SOLN: 4.0000 mg | Freq: Four times a day (QID) | INTRAMUSCULAR | Status: DC | PRN

## 2013-07-05 MED ORDER — HYDROMORPHONE HCL PF 1 MG/ML IJ SOLN: 0.2500 mg | INTRAMUSCULAR | Status: DC | PRN

## 2013-07-05 MED ORDER — MIDAZOLAM HCL 2 MG/2ML IJ SOLN
INTRAMUSCULAR | Status: AC
  Filled 2013-07-05: qty 2

## 2013-07-05 MED ORDER — ROCURONIUM BROMIDE 50 MG/5ML IV SOLN
INTRAVENOUS | Status: AC
Start: 2013-07-05 — End: 2013-07-05
  Filled 2013-07-05: qty 1

## 2013-07-05 MED ORDER — MIDAZOLAM HCL 5 MG/5ML IJ SOLN
INTRAMUSCULAR | Status: DC | PRN
  Administered 2013-07-05: 2 mg via INTRAVENOUS

## 2013-07-05 MED ORDER — FENTANYL CITRATE 0.05 MG/ML IJ SOLN
INTRAMUSCULAR | Status: DC | PRN
  Administered 2013-07-05 (×2): 50 ug via INTRAVENOUS

## 2013-07-05 MED ORDER — OXYCODONE HCL 5 MG/5ML PO SOLN: 5.0000 mg | Freq: Once | ORAL | Status: AC | PRN

## 2013-07-05 MED ORDER — FENTANYL CITRATE 0.05 MG/ML IJ SOLN
INTRAMUSCULAR | Status: AC
  Filled 2013-07-05: qty 5

## 2013-07-05 MED ORDER — OXYCODONE HCL 5 MG PO TABS
5.0000 mg | ORAL_TABLET | Freq: Once | ORAL | Status: AC | PRN
Start: 2013-07-05 — End: 2013-07-05

## 2013-07-05 MED ORDER — M.V.I. ADULT IV INJ
INTRAVENOUS | Status: AC
  Administered 2013-07-05: 17:00:00 via INTRAVENOUS
  Filled 2013-07-05: qty 2000

## 2013-07-05 MED ORDER — PROPOFOL INFUSION 10 MG/ML OPTIME
INTRAVENOUS | Status: DC | PRN
  Administered 2013-07-05: 50 ug/kg/min via INTRAVENOUS

## 2013-07-05 MED ORDER — 0.9 % SODIUM CHLORIDE (POUR BTL) OPTIME
TOPICAL | Status: DC | PRN
  Administered 2013-07-05: 1000 mL

## 2013-07-05 MED ORDER — ROCURONIUM BROMIDE 100 MG/10ML IV SOLN
INTRAVENOUS | Status: DC | PRN
  Administered 2013-07-05: 25 mg via INTRAVENOUS
  Administered 2013-07-05: 50 mg via INTRAVENOUS
  Administered 2013-07-05: 25 mg via INTRAVENOUS

## 2013-07-05 SURGICAL SUPPLY — 53 items
BLADE SURG ROTATE 9660 (MISCELLANEOUS) IMPLANT
BNDG GAUZE ELAST 4 BULKY (GAUZE/BANDAGES/DRESSINGS) ×4 IMPLANT
CANISTER SUCTION 2500CC (MISCELLANEOUS) ×3 IMPLANT
CHLORAPREP W/TINT 26ML (MISCELLANEOUS) ×1 IMPLANT
COVER MAYO STAND STRL (DRAPES) IMPLANT
COVER SURGICAL LIGHT HANDLE (MISCELLANEOUS) ×3 IMPLANT
DRAPE LAPAROSCOPIC ABDOMINAL (DRAPES) ×3 IMPLANT
DRAPE PROXIMA HALF (DRAPES) IMPLANT
DRAPE UTILITY 15X26 W/TAPE STR (DRAPE) ×6 IMPLANT
DRAPE WARM FLUID 44X44 (DRAPE) ×1 IMPLANT
DRSG OPSITE POSTOP 4X10 (GAUZE/BANDAGES/DRESSINGS) IMPLANT
DRSG OPSITE POSTOP 4X8 (GAUZE/BANDAGES/DRESSINGS) IMPLANT
DRSG PAD ABDOMINAL 8X10 ST (GAUZE/BANDAGES/DRESSINGS) ×4 IMPLANT
ELECT BLADE 6.5 EXT (BLADE) IMPLANT
ELECT CAUTERY BLADE 6.4 (BLADE) ×4 IMPLANT
ELECT REM PT RETURN 9FT ADLT (ELECTROSURGICAL) ×3
ELECTRODE REM PT RTRN 9FT ADLT (ELECTROSURGICAL) ×1 IMPLANT
GLOVE BIO SURGEON STRL SZ7.5 (GLOVE) ×2 IMPLANT
GLOVE BIOGEL PI IND STRL 7.0 (GLOVE) IMPLANT
GLOVE BIOGEL PI INDICATOR 7.0 (GLOVE) ×2
GLOVE SURG SIGNA 7.5 PF LTX (GLOVE) ×3 IMPLANT
GLOVE SURG SS PI 7.0 STRL IVOR (GLOVE) ×2 IMPLANT
GOWN STRL REUS W/ TWL LRG LVL3 (GOWN DISPOSABLE) ×2 IMPLANT
GOWN STRL REUS W/ TWL XL LVL3 (GOWN DISPOSABLE) ×1 IMPLANT
GOWN STRL REUS W/TWL LRG LVL3 (GOWN DISPOSABLE) ×3
GOWN STRL REUS W/TWL XL LVL3 (GOWN DISPOSABLE) ×3
KIT BASIN OR (CUSTOM PROCEDURE TRAY) ×3 IMPLANT
KIT ROOM TURNOVER OR (KITS) ×3 IMPLANT
NS IRRIG 1000ML POUR BTL (IV SOLUTION) ×4 IMPLANT
PACK GENERAL/GYN (CUSTOM PROCEDURE TRAY) ×3 IMPLANT
PAD ARMBOARD 7.5X6 YLW CONV (MISCELLANEOUS) ×3 IMPLANT
PENCIL BUTTON HOLSTER BLD 10FT (ELECTRODE) IMPLANT
SPECIMEN JAR LARGE (MISCELLANEOUS) IMPLANT
SPONGE GAUZE 4X4 12PLY (GAUZE/BANDAGES/DRESSINGS) ×2 IMPLANT
SPONGE LAP 18X18 X RAY DECT (DISPOSABLE) ×2 IMPLANT
STAPLER VISISTAT 35W (STAPLE) ×1 IMPLANT
SUCTION POOLE TIP (SUCTIONS) ×1 IMPLANT
SUT NOVA 1 T20/GS 25DT (SUTURE) IMPLANT
SUT NOVA NAB GS-21 0 18 T12 DT (SUTURE) IMPLANT
SUT PDS AB 1 CTX 36 (SUTURE) IMPLANT
SUT PDS AB 4-0 SH 27 (SUTURE) IMPLANT
SUT SILK 2 0 REEL (SUTURE) ×1 IMPLANT
SUT SILK 2 0 SH CR/8 (SUTURE) ×1 IMPLANT
SUT SILK 3 0 (SUTURE)
SUT SILK 3 0 SH CR/8 (SUTURE) ×1 IMPLANT
SUT SILK 3-0 18XBRD TIE 12 (SUTURE) ×1 IMPLANT
SUT VIC AB 3-0 54X BRD REEL (SUTURE) ×1 IMPLANT
SUT VIC AB 3-0 BRD 54 (SUTURE)
TAPE CLOTH SURG 6X10 WHT LF (GAUZE/BANDAGES/DRESSINGS) ×2 IMPLANT
TOWEL OR 17X26 10 PK STRL BLUE (TOWEL DISPOSABLE) ×3 IMPLANT
TRAY FOLEY CATH 16FRSI W/METER (SET/KITS/TRAYS/PACK) IMPLANT
TUBE CONNECTING 12'X1/4 (SUCTIONS)
TUBE CONNECTING 12X1/4 (SUCTIONS) IMPLANT

## 2013-07-05 NOTE — Clinical Social Work Psychosocial (Signed)
Clinical Social Work Department BRIEF PSYCHOSOCIAL ASSESSMENT 07/05/2013  Patient:  [name], [name]     Account Number:  [id number]     Admit date:  07/01/2013  Clinical Social Worker:  Read Drivers  Date/Time:  07/05/2013 09:34 AM  Referred by:  RN  Date Referred:  07/05/2013 Referred for  SNF Placement   Other Referral:   none   Interview type:  Other - See comment Other interview type:   [name], spouse    PSYCHOSOCIAL DATA Living Status:  FACILITY Admitted from facility:  [facility] Level of care:  Skilled Nursing Facility Primary support name:  [name] Primary support relationship to patient:  SPOUSE Degree of support available:   adequate    CURRENT CONCERNS Current Concerns  Post-Acute Placement   Other Concerns:   none    SOCIAL WORK ASSESSMENT / PLAN Pt lethargic and on trach collar.  Pt able to follow commands.  Pt spouse at bedside, [name].  Pt husband stated that pt was a resident at [facility] proir to admission to [facility].  Pt has hx of "chronic AMS".    Pt is having surgery this morning.  Pt husband did not wish to speak at length to ensure he spent time with his wife prior to her surgery.  CSW provided support and comfort.    Pt husband stated that his goal is for his wife to eventually return home.  Pt husband is hopeful this will happen though he says he's slowing losing hope because she "don't seem like she can ever get well enough to go home".    CSW will provide [facility] with an update, per pt husband request.   Assessment/plan status:  Psychosocial Support/Ongoing Assessment of Needs Other assessment/ plan:   FL2  PASARR- existing   Information/referral to community resources:   SNF    PATIENT'S/FAMILY'S RESPONSE TO PLAN OF CARE: Pt husband was agreeable to the plan of returning to [facility] upon dc from the hospital.  Husband was appreciative of CSW assitance and support during  this time.        Vickii Penna, LCSWA [phone]  Clinical Social Work

## 2013-07-05 NOTE — Op Note (Addendum)
07/01/2013 - 07/05/2013  12:41 PM  PATIENT:  [name], 80 y.o., female, MRN: [id number]  PREOP DIAGNOSIS:  Abdominal Wall Infection  POSTOP DIAGNOSIS:   Abdominal Wall Infection  PROCEDURE:   Procedure(s): IRRIGATION AND DEBRIDMENT OF ABDOMINAL WOUND  SURGEON:   Ovidio Kin, M.D.  ASSISTANT:   None  ANESTHESIA:   general  Anesthesiologist: Achille Rich, MD; Laverle Hobby, MD CRNA: Angelica Pou, CRNA; Jeani Hawking, CRNA  General  EBL:  Minimal  ml  BLOOD ADMINISTERED: none  DRAINS: none   LOCAL MEDICATIONS USED:   None  SPECIMEN:   None  COUNTS CORRECT:  YES  INDICATIONS FOR PROCEDURE:  [name] is a 80 y.o. (DOB: 01-16-1933) white  female whose primary care physician is [name], MD and comes for debridement of her abdominal wall.  She had a gastrostomy tube that became dislodged and required I&D by Dr. Grayling Congress on 07/01/2013.  She has a upper midline wound with the gastrostomy tube going through the wound. But laterally on both sides, it was very difficult to pack and change the dressings.  Dr. Derrell Lolling had left penrose drains laterally.   I thought it would be best if the whole wound was filleted open.   The indications and risks of the surgery were explained to the patient's husband.  And I showed him the wound as it was and what I was planning to do.  The risks include, but are not limited to, infection, bleeding, and nerve injury.  Procedure Note:  The patient was taken to room #1 at [facility] and underwent general anesthesia.  She was already on antibiotics.  Her airway was controlled with a tracheostomy that she already had in place.   A time out was held and the surgical checklist run.   Her abdomen was prepped with betadine and sterilely draped.  I extended her current midline wound which is about 8 cm in length laterally as a bilateral subcostal incision.  The total transverse wound length was about 25 cm.  There  were no areas of trapped infection.  I irrigated the wound with saline until it was clear.  I think that the more open wound will make dressing changes easier.   1.  Tool used for debridement (curette, scapel, etc.)  scapel  2.  Frequency of surgical debridement/incision and drainage.   This time.  3.  Measurement of total devitalized tissue (wound surface) before and after surgical debridement.     Wound was 3 x 8 cm before surgery.  The wound is 7 x 25 cm after surgery  4.  Area and depth of devitalized tissue removed from wound.  3 x 20 cm  5.  Blood loss and description of tissue removed.  Not much blood loss.  Tissue debrided was infected subcutaneous fat  6.  Evidence of the progress of the wound's response to treatment.  A.  Current wound volume.  7 x 25 cm  B.  Presence of infection.  Yes  C.  Presence of non viable tissue.  No  D.  Other material in the wound that is expected to inhibit healing.  The patient has a gastrostomy tube going through the wound.   The wound was irrigated with 2 L of saline until most of the purulence was washed away.  The wound was packed with two 4" Curlex gauze damp with saline.  And then the wound was sterilely dressed. She was then returned to the ICU  in good condition.  Ovidio Kinavid Jalee Saine, MD, [facility] Pager: [phone] Office phone:  [phone]

## 2013-07-05 NOTE — Anesthesia Preprocedure Evaluation (Signed)
Anesthesia Evaluation  Patient identified by MRN, date of birth, ID band Patient awake    Reviewed: Allergy & Precautions, H&P , NPO status , Patient's Chart, lab work & pertinent test results  Airway Mallampati: II  Neck ROM: full    Dental   Pulmonary          Cardiovascular hypertension, + Peripheral Vascular Disease + dysrhythmias Atrial Fibrillation + pacemaker  H/o complete heart block.   Neuro/Psych    GI/Hepatic   Endo/Other  Hypothyroidism obese  Renal/GU Renal InsufficiencyRenal disease     Musculoskeletal   Abdominal   Peds  Hematology   Anesthesia Other Findings   Reproductive/Obstetrics                           Anesthesia Physical Anesthesia Plan  ASA: III  Anesthesia Plan: General   Post-op Pain Management:    Induction: Intravenous  Airway Management Planned: Oral ETT  Additional Equipment:   Intra-op Plan:   Post-operative Plan: Extubation in OR  Informed Consent: I have reviewed the patients History and Physical, chart, labs and discussed the procedure including the risks, benefits and alternatives for the proposed anesthesia with the patient or authorized representative who has indicated his/her understanding and acceptance.     Plan Discussed with: CRNA, Anesthesiologist and Surgeon  Anesthesia Plan Comments:         Anesthesia Quick Evaluation

## 2013-07-05 NOTE — Transfer of Care (Signed)
Immediate Anesthesia Transfer of Care Note  Patient: [name]  Procedure(s) Performed: Procedure(s): IRRIGATION AND DEBRIDMENT OF ABDOMINAL WOUND (N/A)  Patient Location: ICU  Anesthesia Type:General  Level of Consciousness: sedated and Patient remains intubated per anesthesia plan per trach.   Airway & Oxygen Therapy: Patient remains intubated per anesthesia plan and Patient placed on Ventilator (see vital sign flow sheet for setting)  Post-op Assessment: Report given to PACU RN and Post -op Vital signs reviewed and stable  Post vital signs: Reviewed and stable  Complications: No apparent anesthesia complications

## 2013-07-05 NOTE — Progress Notes (Signed)
NUTRITION FOLLOW UP  Intervention:   TPN per Pharmacy  RD to continue to follow nutrition care plan.   NUTRITION DIAGNOSIS:  Inadequate oral intake related to inability to eat as evidenced by NPO status; ongoing.   Goal:  Meet >/=90% of estimated nutrition needs   Monitor:  TPN tolerance, weight trends, labs   Assessment:   Patient is a 80 y.o. female with h/o pacemaker due to heart block, chronic AMS (currently at her poor baseline, presumably this is due to her history of cardiac arrest), in nursing home, chronic trach and peg. She presents to the ED with fever and tachycardia. It was noted by her SNF this evening that patient had redness extending across her abdomen surrounding the gastric feeding tube.  Per PharmD note on 5/4, Vital HP at 20 ml/hr stopped due to leaking G-tube on 5/3, NPO. Unable to start TPN on 5/3 due to unable to place PICC line.  Pt has been off the ventilator for 24 hours.   Per RN, pt's mental status has drastically declined today. Per MD note, pt in OR today to more widely open up her wound for packing.  Per RN, long term goals for pt are to remove the trach collar and remove the PEG to feed orally and breathe without assistance. Pt was due to remove the trach collar on 5/1 but was admitted to the hospital, prolonging the recovery. Plans for now are to keep trach collar and PEG and feed with TPN.   Current TPN regimen of Clinimix 5/15 @ 5280ml/hr plus lipids at 7610ml/hr MWF will provide daily average of 1568 kcals and 96 grams of protein. Will meet 100% of estimated energy requirements and 96% of estimated protein requirements.    Height: Ht Readings from Last 1 Encounters:  07/03/13 5' 2.99" (1.6 m)    Weight Status:   Wt Readings from Last 1 Encounters:  07/05/13 152 lb 5.4 oz (69.1 kg)    Re-estimated needs:  Kcal: 1500 - 1700 Protein: >/= 100 g Fluid: >/= 1.8 L/day  Skin:  Pressure ulcer: right heel  generalized edema  Diet Order:  NPO   Intake/Output Summary (Last 24 hours) at 07/05/13 1139 Last data filed at 07/05/13 1015  Gross per 24 hour  Intake 1397.67 ml  Output   3440 ml  Net -2042.33 ml    Last BM: 5/4  Labs:   Recent Labs Lab 07/03/13 0325 07/04/13 0500 07/05/13 0430  NA 146 145 141  K 3.3* 3.8 3.8  CL 107 108 102  CO2 25 24 28   BUN 30* 19 16  CREATININE 0.90 0.84 0.83  CALCIUM 8.3* 8.4 8.6  MG 2.0 1.8 2.0  PHOS 3.1 2.5 3.3  GLUCOSE 117* 92 122*    CBG (last 3)   Recent Labs  07/05/13 0414 07/05/13 0757 07/05/13 1129  GLUCAP 108* 97 158*    Scheduled Meds: . [facility][MAR HOLD] antiseptic oral rinse  15 mL Mouth Rinse QID  . [facility][MAR HOLD] chlorhexidine  15 mL Mouth Rinse BID  . [MAR HOLD] enoxaparin (LOVENOX) injection  40 mg Subcutaneous Q24H  . [facility][MAR HOLD] fentaNYL  50 mcg Transdermal Q72H  . [MAR HOLD] insulin aspart  0-9 Units Subcutaneous 6 times per day  . [facility][MAR HOLD] LORazepam  0.5 mg Intravenous 3 times per day  . [MAR HOLD] piperacillin-tazobactam (ZOSYN)  IV  3.375 g Intravenous 3 times per day    Continuous Infusions: . [MAR HOLD] .TPN (CLINIMIX-E) Adult 40 mL/hr at 07/04/13  1715   And  . [facility] HOLD] fat emulsion 250 mL (07/04/13 1714)  . [MAR HOLD] .TPN (CLINIMIX-E) Adult      Eppie Gibson, BS Dietetic Intern Pager: [phone]  I agree with student dietitian note; appropriate revisions have been made.  Joaquin Courts, RD, LDN, CNSC Pager# [phone] After Hours Pager# [phone]

## 2013-07-05 NOTE — Progress Notes (Addendum)
SLP Cancellation Note  Patient Details Name: TRINIDEE OSTERKAMP MRN: [id number] DOB: 18-Oct-1932   Cancelled treatment:       Reason Eval/Treat Not Completed: Medical issues which prohibited therapy.  Pt remains sedated and on vent post-op.  Will return next date to determine readiness for PMSV eval.   Aris Everts Elisama Thissen 07/05/2013, 1:56 PM

## 2013-07-05 NOTE — Anesthesia Postprocedure Evaluation (Signed)
  Anesthesia Post-op Note  Patient: [name]  Procedure(s) Performed: Procedure(s): IRRIGATION AND DEBRIDMENT OF ABDOMINAL WOUND (N/A)  Patient Location: SICU  Anesthesia Type:General  Level of Consciousness: sedated and Patient remains intubated per anesthesia plan  Airway and Oxygen Therapy: Patient remains intubated per anesthesia plan and Patient placed on Ventilator (see vital sign flow sheet for setting)  Post-op Pain: none  Post-op Assessment: Post-op Vital signs reviewed, Patient's Cardiovascular Status Stable, Respiratory Function Stable, Patent Airway and Pain level controlled  Post-op Vital Signs: stable  Last Vitals:  Filed Vitals:   07/05/13 1116  BP: 135/30  Pulse: 96  Temp:   Resp:     Complications: No apparent anesthesia complications

## 2013-07-05 NOTE — Progress Notes (Signed)
PARENTERAL NUTRITION CONSULT NOTE - INITIAL  Pharmacy Consult for TPN Indication: gastric contents leaking around G-tube.   Allergies  Allergen Reactions  . Beta Adrenergic Blockers Other (See Comments)    Not working well for pt.  Sherrie Mustache. Bystolic [Nebivolol Hcl] Shortness Of Breath  . Carvedilol Shortness Of Breath    Chest  Discomfort.  . Dapsone Other (See Comments)    Kidney  failure  . Diclofenac Sodium Other (See Comments)    Causes  Fluid  Retention.  Consuelo Pandy. Livalo [Pitavastatin Calcium] Shortness Of Breath  . Methotrexate Shortness Of Breath    Mouth sores.  . Metoprolol Succinate Hives  . Oxaprozin Diarrhea  . Other     Fragrance mixtures -  Cinol botanicals  &  Plants oil. Cocamidoproy   -   Betaine. Lipstick & other makeup & cleaning materials.   Marland Kitchen. Epinephrine     Patient Measurements: Height: 5' 2.99" (160 cm) Weight: 152 lb 5.4 oz (69.1 kg) IBW/kg (Calculated) : 52.38 Adjusted Body Weight: 58.2 kg  Vital Signs: Temp: 98.4 F (36.9 C) (05/05 0415) Temp src: Oral (05/05 0415) BP: 98/34 mmHg (05/05 0600) Pulse Rate: 80 (05/05 0600) Intake/Output from previous day: 05/04 0701 - 05/05 0700 In: 1407.7 [I.V.:250; IV Piggyback:400; TPN:637.7] Out: 3290 [Urine:3290] Intake/Output from this shift:    Labs:  Recent Labs  07/03/13 0325 07/04/13 0500  WBC 14.2* 13.7*  HGB 9.1* 8.9*  HCT 29.8* 29.2*  PLT 183 257     Recent Labs  07/03/13 0325 07/04/13 0500 07/05/13 0430  NA 146 145 141  K 3.3* 3.8 3.8  CL 107 108 102  CO2 25 24 28   GLUCOSE 117* 92 122*  BUN 30* 19 16  CREATININE 0.90 0.84 0.83  CALCIUM 8.3* 8.4 8.6  MG 2.0 1.8 2.0  PHOS 3.1 2.5 3.3  PROT 5.4* 5.4*  --   ALBUMIN 1.6* 1.6*  --   AST 12 15  --   ALT 12 12  --   ALKPHOS 74 97  --   BILITOT 0.3 0.4  --   PREALBUMIN  --  4.0*  --   TRIG  --  244*  --    Estimated Creatinine Clearance: 51.3 ml/min (by C-G formula based on Cr of 0.83).    Recent Labs  07/04/13 2014 07/04/13 2353  07/05/13 0414  GLUCAP 110* 119* 108*     Insulin Requirements in the past 24 hours:  On levemir 12 qhs PTA, on SSI currently; cbgs well controlled with 15 units insulin per liter in TPN  Current Nutrition:  Vital HP at 20 ml/hr stopped due to leaking G-tube on 5/3, NPO Clinimix E 5-15 at 40 ml/hr + lipids 10 ml/hr NOT started on 5/3 due to no IV access 5/4 clinimix E 5-15 at 40 ml/rh + lipids at 10 ml/hr 5/5 Clinimix E 5-15 at 80 ml/hr + lipids at 10 ml/hr on MWF only provide daily average of 96 gm protein and 1568 kcals  GI: Abd: Wound with exposed penrose drains and gastric contents still leaking around G-tube into the wound. Tube feedings in wound. May need surgery again for debridement and closure of gastrostomy site. PICC line and TPN ordered 5/3. Unable to place PICC line.  PICC placed by IR 5/4 am.    Lytes: k 3.8 after 4 runs yesterday, na 141. Phos 3.3, mag 2 after 1 gm yesterday  Renal: creat 0.83, UOP improved from  0.6 ml/kg/hr to 2 ml/kg/hr.  Trig: 244  Nutrition: baseline preab = 4, reflects critical illness, inflammatory state  Assessment:  Pharmacy consulted to start TPN in this 80 yo F due to G-tube leaking into wound.  Unable to start 5/3 due to unable to place PICC line.  5/4: Tolerated first day well.   Nutritional Goals: per RD note 5/4 1500-1700 kCal, >/= 100 gm protein per day  Acpepcicess: PICC ordered  5/3 for TPN, unable to place by VAST team, placed by IR 5/4  TPN day #2  Plan:  1. Change to Clinimix E 5-15 at 80 ml/hr + lipids at 10 ml/hr on MWF only provide daily average of 96 gm protein and 1568 kcals.  This provides ~ 96 % protein and 100% kcal needs 2. 30 units of insulin to TPN and continue SSI and CBGs q4h 3. pepcid 40 mg per bag to TPN, she was previously on pepcid 20 per tube bid.  4. MVI daily and trace elements MWF due to shortage 5. bmet in am   [name], Pharm.D. [phone] 07/05/2013 8:09 AM

## 2013-07-05 NOTE — Progress Notes (Signed)
PULMONARY / CRITICAL CARE MEDICINE   Name: [name] MRN: [id number] DOB: 1932-03-10    ADMISSION DATE:  07/01/2013 CONSULTATION DATE:  5./1/15  REFERRING MD :  Dr Derrell Lolling PRIMARY SERVICE: [facility] >> PCCM  CHIEF COMPLAINT:  Abdominal pain REASON FOR CONSULTATION: Vent Management, Severe Sepsis  BRIEF PATIENT DESCRIPTION:  80 y/o chronically ill woman with complicated hospitalization 2-05/2013 to [facility], [facility], [facility] and then back to [facility] before discharge to SNF.  She is s/p Trach and PEG in aftermath of gastroenteritis + ARDS, retroperitoneal bleed, pneumothorax; then PNA (with MDR pseudomonas), septic and hemorrhagic shock. She was weaned to ATC at [facility] and was discharged to SNF for further care. Her other chronic medical problems > Heart block s/p pacer placement (100% paced), hypothyroidism, DM, A Fib, CKD. She was admitted with fever, abdominal pain and erythema of her abdominal wall 5/1. Was found to have subcutaneous tissue infection due to malpositioned PEG requiring significant debridement 5/1. She returned to ICU on MV, hemodynamically stable.   SIGNIFICANT EVENTS / STUDIES:  5/01 CT abd/pelvis: extensive gas in sq tissue consistent with infection, PEG tube out of stomach and into the subcutaneous fat, improved iliopsoas hematoma, possible cholelithiasis, L adrenal mass, small RLL infiltrate 5/01 OR for debridement and irrigation of abd wall, placement of G tube, sub Q drain placement 5/03 leaking PEG tube, TFs held 5/04 PICC placed for TPN. Tolerating ATC. Off vasopressors. Mildly agitated but + F/C and interacting with husband 5/05 To OR for more extensive washout and debridement of abdominal wall abscess/cellulitis. Left on vent post op  LINES / TUBES: R femoral CVL 5/01 >> 5/04 Trach 2/18  >>  G tube 04/2013 >>  RUE PICC 5/04 >>   CULTURES: MRSA PCR 5/01 >> NEG Resp 5/01 >> NOF Urine 5/01 >> Klebsiella (sens to Zosyn) Blood 5/01  >>    ANTIBIOTICS: Vancomycin 5/1 >>  5/04 Pip/tazo 5/1 >>   SUBJECTIVE:  More agitated this AM. + F/C  VITAL SIGNS: Temp:  [97.7 F (36.5 C)-99.3 F (37.4 C)] 97.7 F (36.5 C) (05/05 1600) Pulse Rate:  [72-111] 81 (05/05 1700) Resp:  [12-22] 19 (05/05 1551) BP: (80-156)/(24-94) 131/49 mmHg (05/05 1700) SpO2:  [91 %-100 %] 100 % (05/05 1700) FiO2 (%):  [35 %-40 %] 35 % (05/05 1700) Weight:  [69.1 kg (152 lb 5.4 oz)] 69.1 kg (152 lb 5.4 oz) (05/05 0400)  VENTILATOR SETTINGS: Vent Mode:  [-] PRVC FiO2 (%):  [35 %-40 %] 35 % Set Rate:  [16 bmp] 16 bmp Vt Set:  [500 mL] 500 mL PEEP:  [5 cmH20] 5 cmH20  INTAKE / OUTPUT: Intake/Output     05/04 0701 - 05/05 0700 05/05 0701 - 05/06 0700   I.V. (mL/kg) 250 (3.6) 282.3 (4.1)   Other 130 30   NG/GT     IV Piggyback 400 75   TPN 687.7 500   Total Intake(mL/kg) 1467.7 (21.2) 887.3 (12.8)   Urine (mL/kg/hr) 3290 (2) 825 (1.1)   Total Output 3290 825   Net -1822.3 +62.3          PHYSICAL EXAMINATION: General:  Anxious, RASS +1 Neuro:  No focal deficits HEENT:  WNL Cardiovascular:  Regular, paced, no M Lungs:  Clear bilaterally Abdomen: Soft, dressing intact Ext:  No edema, warm  LABS: I have reviewed all of today's lab results. Relevant abnormalities are discussed in the A/P section  CXR: NNF  ASSESSMENT / PLAN:  PULMONARY A:  Acute on chronic respiratory failure  Tracheostomy status P:   Vent support post op and through night tonight, then  PSV > ATC as long as tolerated SLP eval for PMV trials when back on ATC Supp O2 to maintain SpO2 > 92%  CARDIOVASCULAR A:  CAF Complete heart block s/p pacer - per family, pacemaker battery is to be replaced soon S/P cardiac arrest 2-05/2013 due to mucus plugging P:  Monitor rhythm and BP Will need to discuss with Cardiology re PM battery replacement  RENAL A:   Acute renal insufficiency, resolved Mild hypervolemia P:   Monitor BMET intermittently Monitor I/Os Correct electrolytes as  indicated   GASTROINTESTINAL A:   Malpositioned PEG S/p debridement abd wall infection 5/01, 5/05 Protein-cal malnutrition  P:   SUP: famotidine in TPN Cont TPN G-Tube to gravity drainage per CCS Further mgmt per CCS  HEMATOLOGIC A:   Mild anemia without acute blood loss P:  DVT px: LMWH Monitor CBC intermittently Transfuse per usual ICU guidelines  INFECTIOUS A:   Severe Sepsis, resolving Subcutaneous abdominal wall cellulitis UTI with chronic foley  P:   Micro and abx as above Cont to follow PCT intermittently to determine timing of abx discontinuation  ENDOCRINE A:   DM II Hypothyroidism   P:   Cont IV levothyroxine Cont SSI Holding Levemir  NEUROLOGIC A:   Hx anoxic brain injury  Post op pain Chronic clonazepam use ICU associated agitation P:   Propofol while on vent post op Cont fentanyl patch Cont PRN lorazepam, fent   GLOBAL: Husband updated @ bedside   I have personally obtained a history, examined the patient, evaluated laboratory and imaging results, formulated the assessment and plan and placed orders  CRITICAL CARE: The patient is critically ill with multiple organ systems failure and requires high complexity decision making for assessment and support, frequent evaluation and titration of therapies, application of advanced monitoring technologies and extensive interpretation of multiple databases. Critical Care Time devoted to patient care services described in this note is 35 minutes.   Billy Fischeravid Haruko Mersch, MD ; [facility] service Mobile [phone].  After 5:30 PM or weekends, call [phone]

## 2013-07-05 NOTE — Progress Notes (Signed)
Patient ID: [name], female   DOB: 04/28/1932, 80 y.o.   MRN: [id number] 4 Days Post-Op  Subjective: Pt more lethargic today per the RN.  Otherwise off the vent for 24 hours.  Objective: Vital signs in last 24 hours: Temp:  [98.2 F (36.8 C)-98.6 F (37 C)] 98.4 F (36.9 C) (05/05 0415) Pulse Rate:  [72-100] 80 (05/05 0600) Resp:  [16-22] 16 (05/05 0320) BP: (80-147)/(24-96) 98/34 mmHg (05/05 0600) SpO2:  [92 %-100 %] 96 % (05/05 0600) FiO2 (%):  [30 %-40 %] 35 % (05/05 0600) Weight:  [152 lb 5.4 oz (69.1 kg)] 152 lb 5.4 oz (69.1 kg) (05/05 0400) Last BM Date: 07/04/13  Intake/Output from previous day: 05/04 0701 - 05/05 0700 In: 1407.7 [I.V.:250; IV Piggyback:400; TPN:637.7] Out: 3290 [Urine:3290] Intake/Output this shift:    PE: Abd: packing removed and copious amounts of cloudy tube feed/infection noted.  Still with some erythema along lateral left abdominal wall.  She has more bright erythema noted around her midline wound edges and her skin edges around her penroses.  The internal part of her wound has some necrotic tissue.  When explored, the most lateral portions of the undermined wound can be finger dissected, indicating likely some necrotic tissue that isn't visible.  Lab Results:   Recent Labs  07/03/13 0325 07/04/13 0500  WBC 14.2* 13.7*  HGB 9.1* 8.9*  HCT 29.8* 29.2*  PLT 183 257   BMET  Recent Labs  07/04/13 0500 07/05/13 0430  NA 145 141  K 3.8 3.8  CL 108 102  CO2 24 28  GLUCOSE 92 122*  BUN 19 16  CREATININE 0.84 0.83  CALCIUM 8.4 8.6   PT/INR No results Fitzgerald for this basename: LABPROT, INR,  in the last 72 hours CMP     Component Value Date/Time   NA 141 07/05/2013 0430   K 3.8 07/05/2013 0430   CL 102 07/05/2013 0430   CO2 28 07/05/2013 0430   GLUCOSE 122* 07/05/2013 0430   BUN 16 07/05/2013 0430   CREATININE 0.83 07/05/2013 0430   CALCIUM 8.6 07/05/2013 0430   PROT 5.4* 07/04/2013 0500   ALBUMIN 1.6* 07/04/2013 0500   AST 15 07/04/2013 0500    ALT 12 07/04/2013 0500   ALKPHOS 97 07/04/2013 0500   BILITOT 0.4 07/04/2013 0500   GFRNONAA 65* 07/05/2013 0430   GFRAA 76* 07/05/2013 0430   Lipase     Component Value Date/Time   LIPASE 110* 04/13/2013 1800       Studies/Results: Ir Fluoro Guide Cv Line Right  07/04/2013   CLINICAL DATA:  Poor nutritional status, access for TPN  EXAM: RIGHT UPPER EXTREMITY POWER TRIPLE-LUMEN PICC LINE PLACEMENT WITH ULTRASOUND AND FLUOROSCOPIC GUIDANCE  FLUOROSCOPY TIME:  12 SECONDS  PROCEDURE: The patient was advised of the possible risks andcomplications and agreed to undergo the procedure. The patient was then brought to the angiographic suite for the procedure.  The RIGHT arm was prepped with chlorhexidine, drapedin the usual sterile fashion using maximum barrier technique (cap and mask, sterile gown, sterile gloves, large sterile sheet, hand hygiene and cutaneous antisepsis) and infiltrated locally with 1% Lidocaine.  Ultrasound demonstrated patency of the RIGHT BRACHIAL vein, and this was documented with an image. Under real-time ultrasound guidance, this vein was accessed with a 21 gauge micropuncture needle and image documentation was performed. A 0.018 wire was introduced in to the vein. Over this, a 5 [place] TRIPLE lumen POWER PICC was advanced to the lower SVC/right atrial  junction. Fluoroscopy during the procedure and fluoro spot radiograph confirms appropriate catheter position. The catheter was flushed and covered with asterile dressing.  Catheter length: 40  Complications: No immediate  IMPRESSION: Successful right arm power triple lumen PICC line placement with ultrasound and fluoroscopic guidance. The catheter is ready for use.   Electronically Signed   By: Ruel Favors M.D.   On: 07/04/2013 09:07   Ir US Guide Vasc Access Right  07/04/2013   CLINICAL DATA:  Poor nutritional status, access for TPN  EXAM: RIGHT UPPER EXTREMITY POWER TRIPLE-LUMEN PICC LINE PLACEMENT WITH ULTRASOUND AND FLUOROSCOPIC  GUIDANCE  FLUOROSCOPY TIME:  12 SECONDS  PROCEDURE: The patient was advised of the possible risks andcomplications and agreed to undergo the procedure. The patient was then brought to the angiographic suite for the procedure.  The RIGHT arm was prepped with chlorhexidine, drapedin the usual sterile fashion using maximum barrier technique (cap and mask, sterile gown, sterile gloves, large sterile sheet, hand hygiene and cutaneous antisepsis) and infiltrated locally with 1% Lidocaine.  Ultrasound demonstrated patency of the RIGHT BRACHIAL vein, and this was documented with an image. Under real-time ultrasound guidance, this vein was accessed with a 21 gauge micropuncture needle and image documentation was performed. A 0.018 wire was introduced in to the vein. Over this, a 5 [place] TRIPLE lumen POWER PICC was advanced to the lower SVC/right atrial junction. Fluoroscopy during the procedure and fluoro spot radiograph confirms appropriate catheter position. The catheter was flushed and covered with asterile dressing.  Catheter length: 40  Complications: No immediate  IMPRESSION: Successful right arm power triple lumen PICC line placement with ultrasound and fluoroscopic guidance. The catheter is ready for use.   Electronically Signed   By: Ruel Favors M.D.   On: 07/04/2013 09:07   Dg Chest Port 1 View  07/04/2013   CLINICAL DATA:  Tracheostomy.  EXAM: PORTABLE CHEST - 1 VIEW  COMPARISON:  DG CHEST 1V PORT dated 07/03/2013; DG CHEST 1V PORT dated 07/01/2013  FINDINGS: Tracheostomy tube in good anatomic position. Cardiac pacer noted lead tips in right atrium and right ventricle. Cardiomegaly with diffuse bilateral pulmonary infiltrates consistent with congestive heart failure and pulmonary edema. No trauma pleural effusion. No pneumothorax.  IMPRESSION: 1. Tracheostomy tube in good anatomic position . 2. Interval appearance of bilateral pulmonary infiltrates consistent with pulmonary edema. Underlying pneumonia cannot be  excluded particularly in the left lung base.   Electronically Signed   By: Maisie Fus  Register   On: 07/04/2013 07:21    Anti-infectives: Anti-infectives   Start     Dose/Rate Route Frequency Ordered Stop   07/04/13 0400  vancomycin (VANCOCIN) IVPB 750 mg/150 ml premix  Status:  Discontinued     750 mg 150 mL/hr over 60 Minutes Intravenous Every 18 hours 07/03/13 1558 07/04/13 1152   07/01/13 1500  vancomycin (VANCOCIN) IVPB 750 mg/150 ml premix  Status:  Discontinued     750 mg 150 mL/hr over 60 Minutes Intravenous Every 12 hours 07/01/13 0508 07/03/13 1546   07/01/13 1000  piperacillin-tazobactam (ZOSYN) IVPB 3.375 g     3.375 g 12.5 mL/hr over 240 Minutes Intravenous 3 times per day 07/01/13 0508     07/01/13 0300  piperacillin-tazobactam (ZOSYN) IVPB 3.375 g     3.375 g 100 mL/hr over 30 Minutes Intravenous  Once 07/01/13 0258 07/01/13 0355   07/01/13 0300  vancomycin (VANCOCIN) IVPB 1000 mg/200 mL premix     1,000 mg 200 mL/hr over 60 Minutes Intravenous  Once 07/01/13 0258 07/01/13 0413       Assessment/Plan  1. POD 4, s/p debridment of abdominal wall, placement of gastric feeding tube, subcutaneous drain placement - 07/01/2013 - A. Ramirez  2. Dislodged PEG with abdominal wall cellulitis from tube feeds in subcut space  3. VDRF, now trying trach collar  4. Complete heart block - pacer dependent  5. History of anoxic brain injury  6. Diabetes mellitus  Plan: 1. Due to increase in infection and necrotic tissue, we will plan for repeat OR today to more widely open up her wound for packing.  We have discussed this over the phone with her husband and when he gets here, Dr. Ezzard StandingNewman is going to show the husband what he thinks needs to be done. 2. Cont abx therapy.  LOS: 4 days    Letha CapeKelly E Osborne 07/05/2013, 8:20 AM Pager: [phone]  Agree with above. I spoke to the husband by phone about the need for further debridement.  Though anxious, I think that he understands.  Ovidio Kinavid  Shaquasia Caponigro, MD, [facility] Pager: [phone] Office phone:  [phone]

## 2013-07-06 LAB — GLUCOSE, CAPILLARY
GLUCOSE-CAPILLARY: 136 mg/dL — AB (ref 70–99)
GLUCOSE-CAPILLARY: 148 mg/dL — AB (ref 70–99)
GLUCOSE-CAPILLARY: 173 mg/dL — AB (ref 70–99)
Glucose-Capillary: 137 mg/dL — ABNORMAL HIGH (ref 70–99)
Glucose-Capillary: 185 mg/dL — ABNORMAL HIGH (ref 70–99)
Glucose-Capillary: 148 mg/dL — ABNORMAL HIGH (ref 70–99)
Glucose-Capillary: 120 mg/dL — ABNORMAL HIGH (ref 70–99)

## 2013-07-06 LAB — BASIC METABOLIC PANEL
BUN: 24 mg/dL — ABNORMAL HIGH (ref 6–23)
CO2: 27 mEq/L (ref 19–32)
Calcium: 8.3 mg/dL — ABNORMAL LOW (ref 8.4–10.5)
Chloride: 101 mEq/L (ref 96–112)
Creatinine, Ser: 0.81 mg/dL (ref 0.50–1.10)
GFR calc Af Amer: 78 mL/min — ABNORMAL LOW (ref >90)
GFR, EST NON AFRICAN AMERICAN: 67 mL/min — AB (ref >90)
Glucose, Bld: 139 mg/dL — ABNORMAL HIGH (ref 70–99)
POTASSIUM: 3.8 meq/L (ref 3.7–5.3)
SODIUM: 139 meq/L (ref 137–147)

## 2013-07-06 LAB — CULTURE, BLOOD (ROUTINE X 2)

## 2013-07-06 MED ORDER — SODIUM CHLORIDE 0.9 % IV SOLN: INTRAVENOUS | Status: DC

## 2013-07-06 MED ORDER — TRACE MINERALS CR-CU-F-FE-I-MN-MO-SE-ZN IV SOLN
INTRAVENOUS | Status: AC
  Administered 2013-07-06: 18:00:00 via INTRAVENOUS
  Filled 2013-07-06: qty 2000

## 2013-07-06 MED ORDER — LORAZEPAM 2 MG/ML IJ SOLN
0.5000 mg | INTRAMUSCULAR | Status: DC
  Administered 2013-07-06 – 2013-07-08 (×10): 0.5 mg via INTRAVENOUS
  Filled 2013-07-06 (×10): qty 1

## 2013-07-06 MED ORDER — FAT EMULSION 20 % IV EMUL
240.0000 mL | INTRAVENOUS | Status: AC
  Administered 2013-07-06: 240 mL via INTRAVENOUS
  Filled 2013-07-06: qty 250

## 2013-07-06 MED ORDER — LORAZEPAM 2 MG/ML IJ SOLN
0.5000 mg | INTRAMUSCULAR | Status: DC | PRN
  Administered 2013-07-07 – 2013-07-08 (×3): 0.5 mg via INTRAVENOUS
  Filled 2013-07-06 (×2): qty 1

## 2013-07-06 MED ORDER — FENTANYL CITRATE 0.05 MG/ML IJ SOLN
25.0000 ug | INTRAMUSCULAR | Status: DC | PRN
Start: 2013-07-06 — End: 2013-07-08
  Administered 2013-07-06 – 2013-07-08 (×5): 100 ug via INTRAVENOUS
  Filled 2013-07-06 (×5): qty 2

## 2013-07-06 NOTE — Progress Notes (Signed)
PT Cancellation Note  Patient Details Name: [name] MRN: [id number] DOB: [birth year]   Cancelled Treatment:    Reason Eval/Treat Not Completed: Medical issues which prohibited therapy   [name] [facility] 07/06/2013, 8:32 AM

## 2013-07-06 NOTE — Progress Notes (Signed)
SLP Cancellation Note  Patient Details Name: [name] MRN: [id number] DOB: Jun 07, 1932   Cancelled treatment:       Reason Eval/Treat Not Completed: Fatigue/lethargy limiting ability to participate.  Will continue to follow for readiness for PMSV   Carolan Shiver 07/06/2013, 2:44 PM

## 2013-07-06 NOTE — Progress Notes (Signed)
PARENTERAL NUTRITION CONSULT NOTE - Follow Up  Pharmacy Consult for TPN Indication: gastric contents leaking around G-tube.   Allergies  Allergen Reactions  . Beta Adrenergic Blockers Other (See Comments)    Not working well for pt.  Sherrie Mustache Hcl] Shortness Of Breath  . Carvedilol Shortness Of Breath    Chest  Discomfort.  . Dapsone Other (See Comments)    Kidney  failure  . Diclofenac Sodium Other (See Comments)    Causes  Fluid  Retention.  Consuelo Pandy [Pitavastatin Calcium] Shortness Of Breath  . Methotrexate Shortness Of Breath    Mouth sores.  . Metoprolol Succinate Hives  . Oxaprozin Diarrhea  . Other     Fragrance mixtures -  Cinol botanicals  &  Plants oil. Cocamidoproy   -   Betaine. Lipstick & other makeup & cleaning materials.   Marland Kitchen Epinephrine    Patient Measurements: Height: 5' 2.99" (160 cm) Weight: 152 lb 5.4 oz (69.1 kg) IBW/kg (Calculated) : 52.38 Adjusted Body Weight: 58.2 kg  Vital Signs: Temp: 98.7 F (37.1 C) (05/06 0700) Temp src: Oral (05/06 0900) BP: 142/56 mmHg (05/06 0900) Pulse Rate: 99 (05/06 0900) Intake/Output from previous day: 05/05 0701 - 05/06 0700 In: 2287.4 [I.V.:347.4; IV Piggyback:150; TPN:1760] Out: 1475 [Urine:1475] Intake/Output from this shift: Total I/O In: 208.6 [I.V.:3.6; IV Piggyback:25; TPN:180] Out: 40 [Urine:40]  Labs:  Recent Labs  07/04/13 0500  WBC 13.7*  HGB 8.9*  HCT 29.2*  PLT 257    Recent Labs  07/04/13 0500 07/05/13 0430 07/05/13 1355 07/06/13 0500  NA 145 141  --  139  K 3.8 3.8  --  3.8  CL 108 102  --  101  CO2 24 28  --  27  GLUCOSE 92 122*  --  139*  BUN 19 16  --  24*  CREATININE 0.84 0.83  --  0.81  CALCIUM 8.4 8.6  --  8.3*  MG 1.8 2.0  --   --   PHOS 2.5 3.3  --   --   PROT 5.4*  --   --   --   ALBUMIN 1.6*  --   --   --   AST 15  --   --   --   ALT 12  --   --   --   ALKPHOS 97  --   --   --   BILITOT 0.4  --   --   --   PREALBUMIN 4.0*  --   --   --   TRIG  244*  --  229*  --    Estimated Creatinine Clearance: 52.5 ml/min (by C-G formula based on Cr of 0.81).   Recent Labs  07/06/13 0003 07/06/13 0411 07/06/13 0804  GLUCAP 136* 137* 148*   Insulin Requirements in the past 24 hours:  On levemir 12 qhs PTA, on SSI currently; cbgs all are < 150 and well controlled with 15 units insulin per liter in TPN  Current Nutrition:  Vital HP at 20 ml/hr stopped due to leaking G-tube on 5/3, NPO Clinimix E 5-15 at 40 ml/hr + lipids 10 ml/hr NOT started on 5/3 due to no IV access 5/4 clinimix E 5-15 at 40 ml/rh + lipids at 10 ml/hr 5/5 Clinimix E 5-15 at 80 ml/hr + lipids at 10 ml/hr on MWF only provide daily average of 96 gm protein and 1568 kcals  GI: Abd: Wound with exposed penrose drains and gastric contents still leaking around  G-tube into the wound. Tube feedings in wound. She was taking back to the OR 5/5 for wound debridement. No plans for G-tube feedings yet.     Lytes: Potassium still down after supplementation - likely OR losses, otherwise they are WNL.  Renal: Creatinine remains stable, she has an est. crcl of 3953ml/min.  UOP ok at 0.9 ml/kg/hr.     Trig: Elevated 244 (5/4) >229 5/5.  She was placed on propofol drip after OR yesterday - will need to f/u for LOT and triglycerides with this on board.  Nutrition: baseline preab = 4, reflects critical illness, inflammatory state  Assessment:  Pharmacy consulted to start TPN in this 80 yo F due to G-tube leaking into wound.  Unable to start 5/3 due to unable to place PICC line.  5/4: Tolerated first day well.   Nutritional Goals: per RD note 5/4 1500-1700 kCal, >/= 100 gm protein per day  Acpepcicess: PICC ordered  5/3 for TPN, unable to place by VAST team, placed by IR 5/4  TPN day #3  Plan:  1. Change to Clinimix E 5-15 at 80 ml/hr + lipids at 10 ml/hr on MWF only provide daily average of 96 gm protein and 1568 kcals.  This provides ~ 96 % protein and 100% kcal needs 2. 30 units of  insulin to TPN and continue SSI and CBGs q4h 3. pepcid 40 mg per bag to TPN, she was previously on pepcid 20 per tube bid.  4. MVI daily and trace elements MWF due to shortage 5. Thursday labs  Nadara MustardNita Hadja Harral, PharmD., MS Clinical Pharmacist Pager:  [phone] Thank you for allowing pharmacy to be part of this patients care team. 07/06/2013 10:02 AM

## 2013-07-06 NOTE — Progress Notes (Signed)
ANTIBIOTIC CONSULT NOTE - Follow-Up  Pharmacy Consult for Zosyn Indication: rule out sepsis  Allergies  Allergen Reactions  . Beta Adrenergic Blockers Other (See Comments)    Not working well for pt.  Sherrie Mustache Hcl] Shortness Of Breath  . Carvedilol Shortness Of Breath    Chest  Discomfort.  . Dapsone Other (See Comments)    Kidney  failure  . Diclofenac Sodium Other (See Comments)    Causes  Fluid  Retention.  Consuelo Pandy [Pitavastatin Calcium] Shortness Of Breath  . Methotrexate Shortness Of Breath    Mouth sores.  . Metoprolol Succinate Hives  . Oxaprozin Diarrhea  . Other     Fragrance mixtures -  Cinol botanicals  &  Plants oil. Cocamidoproy   -   Betaine. Lipstick & other makeup & cleaning materials.   Marland Kitchen Epinephrine    Patient Measurements: 78.6 kg Vital Signs: Temp: 98.7 F (37.1 C) (05/06 0700) Temp src: Oral (05/06 0700) BP: 120/45 mmHg (05/06 0833) Pulse Rate: 92 (05/06 0833) Labs:  Recent Labs  07/04/13 0500 07/05/13 0430 07/06/13 0500  WBC 13.7*  --   --   HGB 8.9*  --   --   PLT 257  --   --   CREATININE 0.84 0.83 0.81   Vancomycin trough level = 29.4  Medical History: Past Medical History  Diagnosis Date  . Hypothyroid   . Complete heart block 2002    Initial PPM 2002 with upgrade by JA to CRT-P 11/21/08  . Nonischemic cardiomyopathy   . Persistent atrial fibrillation   . Hypertension   . Hyperlipidemia   . Hives 4/10  . Cataract 4/13    right  . Renal failure     3rd stage-seeing neurologist  . Cardiac arrest    Assessment: 80 y/o F with MDR Klebsiella UTI and an abdominal wall infection s/p OR revision and currently with open wound and drain on Zosyn day #6. WBC and LA are down. Patient remains afebrile. PCT last checked was 0.47. SCr remains stable.   5/1 Vanc (VT 29 on 5/3 on 750 q12)> 5/4 5/1 Zosyn >>  5/1 UCx>MDR Kleb (S-Gent, Imi, Zosyn; I or R to all others) 5/1 BCX x 2 > ngtd 5/1 Resp Cx > NPF  Goal of  Therapy:  Clinical resolution of infection  Plan:   -Continue Zosyn 3.375G IV q8h to be infused over 4 hours  Link Snuffer, PharmD, BCPS Clinical Pharmacist [phone]  07/06/2013 8:38 AM

## 2013-07-06 NOTE — Progress Notes (Signed)
PULMONARY / CRITICAL CARE MEDICINE   Name: [name] Noell MRN: [id number] DOB: 08/06/1932    ADMISSION DATE:  07/01/2013 CONSULTATION DATE:  5./1/15  REFERRING MD :  Dr Derrell Lollingamirez PRIMARY SERVICE: [facility] >> PCCM  CHIEF COMPLAINT:  Abdominal pain REASON FOR CONSULTATION: Vent Management, Severe Sepsis  BRIEF PATIENT DESCRIPTION:  80 y/o chronically ill woman with complicated [facility].  She is s/p Trach and PEG in aftermath of gastroenteritis + ARDS, retroperitoneal bleed, pneumothorax; then PNA (with MDR pseudomonas), septic and hemorrhagic shock. She was weaned to ATC at [facility] and was discharged to SNF for further care. Her other chronic medical problems > Heart block s/p pacer placement (100% paced), hypothyroidism, DM, A Fib, CKD. She was admitted with fever, abdominal pain and erythema of her abdominal wall 5/1. Was found to have subcutaneous tissue infection due to malpositioned PEG requiring significant debridement 5/1. She returned to ICU on MV, hemodynamically stable.   SIGNIFICANT EVENTS / STUDIES:  5/01 CT abd/pelvis: extensive gas in sq tissue consistent with infection, PEG tube out of stomach and into the subcutaneous fat, improved iliopsoas hematoma, possible cholelithiasis, L adrenal mass, small RLL infiltrate 5/01 OR for debridement and irrigation of abd wall, placement of Fitzgerald tube, sub Q drain placement 5/03 leaking PEG tube, TFs held 5/04 PICC placed for TPN. Tolerating ATC. Off vasopressors. Mildly agitated but + F/C and interacting with husband 5/05 To OR for more extensive washout and debridement of abdominal wall abscess/cellulitis. Left on vent post op  LINES / TUBES: R femoral CVL 5/01 >> 5/04 Trach 2/18  >>  Fitzgerald tube 04/2013 >>  RUE PICC 5/04 >>   CULTURES: MRSA PCR 5/01 >> NEG Resp 5/01 >> NOF Urine 5/01 >> Klebsiella (sens to Zosyn) Blood 5/01  >>    ANTIBIOTICS: Vancomycin 5/1 >>  5/04 Pip/tazo 5/1 >>   SUBJECTIVE:  Agitated. + F/C intermittently  VITAL SIGNS: Temp:  [97.8 F (36.6 C)-100.2 F (37.9 C)] 98.5 F (36.9 C) (05/06 2014) Pulse Rate:  [78-99] 90 (05/06 2000) Resp:  [16-26] 18 (05/06 1939) BP: (80-166)/(34-104) 142/51 mmHg (05/06 2000) SpO2:  [93 %-100 %] 100 % (05/06 2000) FiO2 (%):  [28 %-35 %] 28 % (05/06 2000) Weight:  [69.1 kg (152 lb 5.4 oz)] 69.1 kg (152 lb 5.4 oz) (05/06 0500)  VENTILATOR SETTINGS: Vent Mode:  [-] CPAP;PSV FiO2 (%):  [28 %-35 %] 28 % Set Rate:  [16 bmp] 16 bmp Vt Set:  [500 mL] 500 mL PEEP:  [5 cmH20] 5 cmH20 Pressure Support:  [5 cmH20] 5 cmH20  INTAKE / OUTPUT: Intake/Output     05/06 0701 - 05/07 0700   I.V. (mL/kg) 53.9 (0.8)   Other 30   IV Piggyback 100   TPN 1170   Total Intake(mL/kg) 1353.9 (19.6)   Urine (mL/kg/hr) 830 (0.8)   Total Output 830   Net +523.9       Stool Occurrence 2 x     PHYSICAL EXAMINATION: General: RASS 0 Neuro:  No focal deficits HEENT:  WNL Cardiovascular:  Regular, paced, no M Lungs:  Clear bilaterally Abdomen: Soft, dressing intact Ext:  No edema, warm  LABS: I have reviewed all of today's lab results. Relevant abnormalities are discussed in the A/P section  CXR: NNF  ASSESSMENT / PLAN:  PULMONARY A:  Acute on chronic respiratory failure Tracheostomy status P:   Vent settings reviewed and adjusted as needed PSV >  ATC as long as tolerated SLP eval for PMV trials when back on ATC Supp O2 to maintain SpO2 > 92%  CARDIOVASCULAR A:  CAF Complete heart block s/p pacer - per family, pacemaker battery is to be replaced soon S/P cardiac arrest 2-05/2013 due to mucus plugging P:  Monitor rhythm and BP Will need to discuss with Cardiology re PM battery replacement  RENAL A:   Acute renal insufficiency, resolved Mild hypervolemia P:   Monitor BMET intermittently Monitor I/Os Correct electrolytes as indicated   GASTROINTESTINAL A:   Malpositioned  PEG S/p debridement abd wall infection 5/01, 5/05 Protein-cal malnutrition  P:   SUP: famotidine in TPN Cont TPN Fitzgerald-Tube to gravity drainage per CCS Further mgmt per CCS  HEMATOLOGIC A:   Mild anemia without acute blood loss P:  DVT px: LMWH Monitor CBC intermittently Transfuse per usual ICU guidelines  INFECTIOUS A:   Severe Sepsis, resolved Abdominal wall cellulitis UTI, resolved P:   Micro and abx as above Cont to follow PCT intermittently to determine timing of abx discontinuation  ENDOCRINE A:   DM II Hypothyroidism   P:   Cont IV levothyroxine Cont SSI Holding Levemir  NEUROLOGIC A:   Recent anoxic brain injury  Post op pain Chronic clonazepam use ICU associated agitation P:   Low dose scheduled lorazepam Cont fentanyl patch Cont PRN lorazepam, fent   GLOBAL: Husband updated @ bedside   I have personally obtained a history, examined the patient, evaluated laboratory and imaging results, formulated the assessment and plan and placed orders  CRITICAL CARE: The patient is critically ill with multiple organ systems failure and requires high complexity decision making for assessment and support, frequent evaluation and titration of therapies, application of advanced monitoring technologies and extensive interpretation of multiple databases. Critical Care Time devoted to patient care services described in this note is 35 minutes.   Billy Fischer, MD ; [name] [phone].  After 5:30 PM or weekends, call [phone]

## 2013-07-06 NOTE — Progress Notes (Signed)
Patient ID: [name], female   DOB: 1933-01-11, 80 y.o.   MRN: [id number] 1 Day Post-Op  Subjective: Pt lethargic from meds.  Does squeeze my hand on command.  Objective: Vital signs in last 24 hours: Temp:  [97.7 F (36.5 C)-100.2 F (37.9 C)] 98.7 F (37.1 C) (05/06 0700) Pulse Rate:  [75-111] 99 (05/06 0900) Resp:  [19-26] 26 (05/06 0833) BP: (80-156)/(30-87) 142/56 mmHg (05/06 0900) SpO2:  [94 %-100 %] 100 % (05/06 0900) FiO2 (%):  [35 %] 35 % (05/06 0900) Weight:  [152 lb 5.4 oz (69.1 kg)] 152 lb 5.4 oz (69.1 kg) (05/06 0500) Last BM Date: 07/06/13  Intake/Output from previous day: 05/05 0701 - 05/06 0700 In: 2287.4 [I.V.:347.4; IV Piggyback:150; TPN:1760] Out: 1475 [Urine:1475] Intake/Output this shift: Total I/O In: 208.6 [I.V.:3.6; IV Piggyback:25; TPN:180] Out: 40 [Urine:40]  PE: Abd: wound is mostly clean.  Packing removed and packing replaced.  Abdominal wall erythema is much better today.  Lab Results:   Recent Labs  07/04/13 0500  WBC 13.7*  HGB 8.9*  HCT 29.2*  PLT 257   BMET  Recent Labs  07/05/13 0430 07/06/13 0500  NA 141 139  K 3.8 3.8  CL 102 101  CO2 28 27  GLUCOSE 122* 139*  BUN 16 24*  CREATININE 0.83 0.81  CALCIUM 8.6 8.3*   PT/INR No results Fitzgerald for this basename: LABPROT, INR,  in the last 72 hours CMP     Component Value Date/Time   NA 139 07/06/2013 0500   K 3.8 07/06/2013 0500   CL 101 07/06/2013 0500   CO2 27 07/06/2013 0500   GLUCOSE 139* 07/06/2013 0500   BUN 24* 07/06/2013 0500   CREATININE 0.81 07/06/2013 0500   CALCIUM 8.3* 07/06/2013 0500   PROT 5.4* 07/04/2013 0500   ALBUMIN 1.6* 07/04/2013 0500   AST 15 07/04/2013 0500   ALT 12 07/04/2013 0500   ALKPHOS 97 07/04/2013 0500   BILITOT 0.4 07/04/2013 0500   GFRNONAA 67* 07/06/2013 0500   GFRAA 78* 07/06/2013 0500   Lipase     Component Value Date/Time   LIPASE 110* 04/13/2013 1800       Studies/Results: No results Fitzgerald.  Anti-infectives: Anti-infectives   Start      Dose/Rate Route Frequency Ordered Stop   07/04/13 0400  vancomycin (VANCOCIN) IVPB 750 mg/150 ml premix  Status:  Discontinued     750 mg 150 mL/hr over 60 Minutes Intravenous Every 18 hours 07/03/13 1558 07/04/13 1152   07/01/13 1500  vancomycin (VANCOCIN) IVPB 750 mg/150 ml premix  Status:  Discontinued     750 mg 150 mL/hr over 60 Minutes Intravenous Every 12 hours 07/01/13 0508 07/03/13 1546   07/01/13 1000  piperacillin-tazobactam (ZOSYN) IVPB 3.375 g     3.375 g 12.5 mL/hr over 240 Minutes Intravenous 3 times per day 07/01/13 0508     07/01/13 0300  piperacillin-tazobactam (ZOSYN) IVPB 3.375 g     3.375 g 100 mL/hr over 30 Minutes Intravenous  Once 07/01/13 0258 07/01/13 0355   07/01/13 0300  vancomycin (VANCOCIN) IVPB 1000 mg/200 mL premix     1,000 mg 200 mL/hr over 60 Minutes Intravenous  Once 07/01/13 0258 07/01/13 0413       Assessment/Plan 1. POD 5/1, s/p debridment of abdominal wall, placement of gastric feeding tube, subcutaneous drain placement - 07/01/2013 - A. Derrell Lolling, 07-05-13 D. Krisy Dix  2. Dislodged PEG with abdominal wall cellulitis from tube feeds in subcut space  3. VDRF,  now trying trach collar  4. Complete heart block - pacer dependent  5. History of anoxic brain injury  6. Diabetes mellitus   Plan: 1. Wound is widely open now and drained well.  Begin NS WD dressing changes to wound BID. 2. Vent care per CCM 3. Cont abx therapy 4. Cont TNA and not feeding g-tube for now.  LOS: 5 days    [name] 07/06/2013, 9:32 AM Pager: [phone]  Agree with above.  Ovidio Kinavid Melaina Howerton, MD, [facility] Pager: [phone] Office phone:  [phone]

## 2013-07-06 NOTE — Progress Notes (Signed)
RT placed patient on 28% ATC. Patient tolerating well at this time. Vital signs stable. RT will continue to monitor.

## 2013-07-07 ENCOUNTER — Inpatient Hospital Stay (HOSPITAL_COMMUNITY): Payer: Medicare Other

## 2013-07-07 ENCOUNTER — Other Ambulatory Visit (HOSPITAL_COMMUNITY): Payer: Medicare Other

## 2013-07-07 LAB — COMPREHENSIVE METABOLIC PANEL
ALBUMIN: 1.5 g/dL — AB (ref 3.5–5.2)
ALK PHOS: 89 U/L (ref 39–117)
ALT: 9 U/L (ref 0–35)
AST: 16 U/L (ref 0–37)
BILIRUBIN TOTAL: 0.3 mg/dL (ref 0.3–1.2)
BUN: 29 mg/dL — AB (ref 6–23)
CO2: 25 meq/L (ref 19–32)
CREATININE: 0.8 mg/dL (ref 0.50–1.10)
Calcium: 8.4 mg/dL (ref 8.4–10.5)
Chloride: 100 mEq/L (ref 96–112)
GFR calc Af Amer: 79 mL/min — ABNORMAL LOW (ref >90)
GFR calc non Af Amer: 68 mL/min — ABNORMAL LOW (ref >90)
Glucose, Bld: 165 mg/dL — ABNORMAL HIGH (ref 70–99)
POTASSIUM: 3.6 meq/L — AB (ref 3.7–5.3)
Sodium: 139 mEq/L (ref 137–147)
Total Protein: 5.6 g/dL — ABNORMAL LOW (ref 6.0–8.3)

## 2013-07-07 LAB — CULTURE, BLOOD (ROUTINE X 2): CULTURE: NO GROWTH

## 2013-07-07 LAB — GLUCOSE, CAPILLARY
GLUCOSE-CAPILLARY: 170 mg/dL — AB (ref 70–99)
GLUCOSE-CAPILLARY: 164 mg/dL — AB (ref 70–99)
Glucose-Capillary: 168 mg/dL — ABNORMAL HIGH (ref 70–99)
Glucose-Capillary: 142 mg/dL — ABNORMAL HIGH (ref 70–99)
Glucose-Capillary: 198 mg/dL — ABNORMAL HIGH (ref 70–99)

## 2013-07-07 LAB — PHOSPHORUS: PHOSPHORUS: 3.2 mg/dL (ref 2.3–4.6)

## 2013-07-07 LAB — MAGNESIUM: Magnesium: 1.9 mg/dL (ref 1.5–2.5)

## 2013-07-07 MED ORDER — POTASSIUM CHLORIDE 10 MEQ/50ML IV SOLN
10.0000 meq | INTRAVENOUS | Status: AC
Start: 2013-07-07 — End: 2013-07-07
  Administered 2013-07-07 (×4): 10 meq via INTRAVENOUS
  Filled 2013-07-07: qty 50

## 2013-07-07 MED ORDER — FUROSEMIDE 10 MG/ML IJ SOLN: 40.0000 mg | Freq: Once | INTRAMUSCULAR | Status: DC

## 2013-07-07 MED ORDER — INSULIN ASPART 100 UNIT/ML ~~LOC~~ SOLN
0.0000 [IU] | Freq: Three times a day (TID) | SUBCUTANEOUS | Status: DC
  Administered 2013-07-07: 2 [IU] via SUBCUTANEOUS

## 2013-07-07 MED ORDER — POTASSIUM CHLORIDE 10 MEQ/50ML IV SOLN
10.0000 meq | INTRAVENOUS | Status: AC
  Administered 2013-07-07 (×2): 10 meq via INTRAVENOUS
  Filled 2013-07-07 (×2): qty 50

## 2013-07-07 MED ORDER — POTASSIUM CHLORIDE 10 MEQ/50ML IV SOLN: 10.0000 meq | INTRAVENOUS | Status: DC

## 2013-07-07 MED ORDER — FENTANYL 50 MCG/HR TD PT72: 100.0000 ug | MEDICATED_PATCH | TRANSDERMAL | Status: DC

## 2013-07-07 MED ORDER — LEVOTHYROXINE SODIUM 100 MCG IV SOLR
50.0000 ug | Freq: Every day | INTRAVENOUS | Status: DC
  Administered 2013-07-07 – 2013-07-08 (×2): 50 ug via INTRAVENOUS
  Filled 2013-07-07 (×2): qty 5

## 2013-07-07 MED ORDER — FUROSEMIDE 10 MG/ML IJ SOLN
40.0000 mg | Freq: Two times a day (BID) | INTRAMUSCULAR | Status: AC
  Administered 2013-07-07: 40 mg via INTRAVENOUS
  Filled 2013-07-07: qty 4

## 2013-07-07 MED ORDER — M.V.I. ADULT IV INJ
INTRAVENOUS | Status: DC
  Administered 2013-07-07: 17:00:00 via INTRAVENOUS
  Filled 2013-07-07: qty 2000

## 2013-07-07 MED ORDER — INSULIN ASPART 100 UNIT/ML ~~LOC~~ SOLN
0.0000 [IU] | SUBCUTANEOUS | Status: DC
  Administered 2013-07-07 (×2): 3 [IU] via SUBCUTANEOUS
  Administered 2013-07-08 (×4): 2 [IU] via SUBCUTANEOUS

## 2013-07-07 NOTE — Progress Notes (Signed)
[facility] ADULT ICU REPLACEMENT PROTOCOL FOR AM LAB REPLACEMENT ONLY  The patient does apply for the [facility] Adult ICU Electrolyte Replacment Protocol based on the criteria listed below:   1. Is GFR >/= 40 ml/min? yes  Patient's GFR today is 68 2. Is urine output >/= 0.5 ml/kg/hr for the last 6 hours? yes Patient's UOP is 1.39 ml/kg/hr 3. Is BUN < 60 mg/dL? yes  Patient's BUN today is 29 4. Abnormal electrolyte( K 3.6 5. Ordered repletion with:per protocol 6. If a panic level lab has been reported, has the CCM MD in charge been notified? yes.   Physician:  Dr Erick Blinks McEachran Reona Zendejas 07/07/2013 6:22 AM

## 2013-07-07 NOTE — Discharge Summary (Deleted)
Physician Discharge Summary  Patient ID: [name] Marsolek MRN: [id number] DOB/AGE: 78/11/1932 78 y.o.  Admit date: 07/01/2013 Discharge date: 07/08/2013  Problem List Principal Problem:   Severe sepsis Active Problems:   Unspecified hypothyroidism   Acute on chronic respiratory failure   Tracheostomy status   PEG (percutaneous endoscopic gastrostomy) status   Abdominal wall cellulitis   Lactic acidosis  HPI:   80 y/o F admitted 2/2 with norovirus gastroenteritis,respiratory failure, hypovolemic and presumed septic shock - source unclear. Course complicated by ARDS, AKI. 2/8 am suffered mucus plugging with ETT obstruction with subsequent cardiac arrest. Required CPR x 9 mins, improved with re-intubation & left pneumothorax decompression. Failed extubation on 2/18 due to severe deconditioning & hypoxia & required tstomy. Unfortunately, she has had ongoing fevers that have not had clear defervescence. Cultures & imaging are negative for overt ongoing infectious process. [facility] ARDS non specific lung disease (? Viral pneumonitis, amiodarone or other drug toxicity). 2/27 she was seen by PCCM and was working towards trach collar and was doing well. 3/2 patient became hypotensive, pale, tachycardic and a hemoglobin drop to 6 was noted. She was hypotensive and felt to have hemorrhagic shock, Transferred back to ICU.   Hospital Course:  SIGNIFICANT EVENTS / STUDIES:  5/01 CT abd/pelvis: extensive gas in sq tissue consistent with infection, PEG tube out of stomach and into the subcutaneous fat, improved iliopsoas hematoma, possible cholelithiasis, L adrenal mass, small RLL infiltrate  5/01 OR for debridement and irrigation of abd wall, placement of Fitzgerald tube, sub Q drain placement  5/03 leaking PEG tube, TFs held  5/04 PICC placed for TPN. Tolerating ATC. Off vasopressors. Mildly agitated but + F/C and interacting with husband  5/05 To OR for more extensive washout and  debridement of abdominal wall abscess/cellulitis. Left on vent post op  5/07 Episode of dyspnea, hypoxemia and mucus plugging. CXR: increased B AS Dz. Received Lasix X 1 dose. Accepted for transfer to [facility]. Planned for 5/08  LINES / TUBES:  R femoral CVL 5/01 >> 5/04  Trach 2/18 >>  Fitzgerald tube 04/2013 >>  RUE PICC 5/04 >>  CULTURES:  MRSA PCR 5/01 >> NEG  Resp 5/01 >> NOF  Urine 5/01 >> Klebsiella (sens to Zosyn)  Blood 5/01 >> NEG  ANTIBIOTICS:  Vancomycin 5/1 >> 5/04  Pip/tazo 5/1 >> (10 days planned)   VENTILATOR SETTINGS:  Vent Mode: [-] PRVC  FiO2 (%): [28 %-40 %] 40 %  Set Rate: [15 bmp] 15 bmp  Vt Set: [500 mL] 500 mL  PEEP: [5 cmH20] 5 cmH20   ASSESSMENT / PLAN:  PULMONARY  A:  Acute on chronic respiratory failure  Tracheostomy status  P:  Vent settings reviewed and adjusted as needed  Cont PSV > ATC as long as tolerated  SLP eval for PMV trials when back on ATC  Supp O2 to maintain SpO2 > 92%  CARDIOVASCULAR  A:  CAF, rate controlled  Complete heart block s/p pacer - per family, pacemaker battery is to be replaced soon  S/P cardiac arrest 2-05/2013 due to mucus plugging  P:  Monitor rhythm and BP  Will need to discuss with Cardiology re PM battery replacement  RENAL  A:  Acute renal insufficiency, resolved  Mild hypervolemia  P:  Monitor BMET intermittently  Monitor I/Os  Correct electrolytes as indicated  GASTROINTESTINAL  A:  Malpositioned PEG  S/p debridement abd wall infection 5/01, 5/05  Protein-cal malnutrition  P:  SUP: famotidine in TPN  Cont TPN  Fitzgerald-Tube to gravity drainage per CCS  Further mgmt per CCS  HEMATOLOGIC  A:  Mild anemia without acute blood loss  P:  DVT px: LMWH  Monitor CBC intermittently  Transfuse per usual ICU guidelines  INFECTIOUS  A:  Severe Sepsis, resolved  Abdominal wall cellulitis  UTI, resolved  P:  Micro and abx as above  Cont to follow PCT intermittently to determine timing of abx discontinuation   ENDOCRINE  A:  DM II  Hypothyroidism  P:  Cont IV levothyroxine  Cont SSI  Holding Levemir  NEUROLOGIC  A:  Recent anoxic brain injury  Post op pain  Chronic clonazepam use  ICU associated agitation  P:  Low dose scheduled lorazepam  Cont fentanyl patch  Cont PRN lorazepam, fent  GLOBAL:  Husband updated @ bedside         Labs at discharge Lab Results  Component Value Date   CREATININE 0.88 07/08/2013   BUN 32* 07/08/2013   NA 141 07/08/2013   K 4.0 07/08/2013   CL 104 07/08/2013   CO2 27 07/08/2013   Lab Results  Component Value Date   WBC 13.7* 07/04/2013   HGB 8.9* 07/04/2013   HCT 29.2* 07/04/2013   MCV 94.8 07/04/2013   PLT 257 07/04/2013   Lab Results  Component Value Date   ALT 9 07/07/2013   AST 16 07/07/2013   ALKPHOS 89 07/07/2013   BILITOT 0.3 07/07/2013   Lab Results  Component Value Date   INR 1.45 07/01/2013   INR 1.05 05/03/2013   INR 1.63* 05/03/2013    Current radiology studies Dg Chest Port 1 View  07/07/2013   CLINICAL DATA:  Respiratory distress.  EXAM: PORTABLE CHEST - 1 VIEW  COMPARISON:  07/04/2013  FINDINGS: Tracheostomy appropriately positioned. Pacer/ AICD device unchanged. A right-sided PICC line is poorly visualized centrally, due to overlying pacer wires.  Mild cardiomegaly, accentuated by AP portable technique. Trace bilateral pleural effusions. No pneumothorax. Interstitial edema is moderate and slightly increased. Suspect airspace disease over the left mid lung laterally. New or increased bibasilar airspace disease.  IMPRESSION: Slight increase in moderate congestive heart failure with small bilateral pleural effusions.  New or worsening bibasilar Airspace disease, likely atelectasis.  Suspect developing airspace disease in the left mid lung laterally. Likely alveolar edema.   Electronically Signed   By: Jeronimo Greaves M.D.   On: 07/07/2013 14:42    Disposition:  01-Home or Self Care   Future Appointments Provider Department Dept Phone   11/18/2013 12:45  PM Annamaria Boots, MD [facility] [phone]       Medication List    ASK your doctor about these medications       clonazePAM 0.5 MG tablet  Commonly known as:  KLONOPIN  Take one tablet by mouth every 8 hours for anxiety     diphenoxylate-atropine 2.5-0.025 MG per tablet  Commonly known as:  LOMOTIL  Take one tablet by mouth four times daily as needed for diarrhea     famotidine 20 MG tablet  Commonly known as:  PEPCID  Take 20 mg by mouth 2 (two) times daily.     feeding supplement (PRO-STAT SUGAR FREE 64) Liqd  Take 30 mLs by mouth 3 (three) times daily with meals.     fentaNYL 50 MCG/HR  Commonly known as:  DURAGESIC - dosed mcg/hr  Remove old patch and then apply one patch topically every  72 hours. Rotate sites     furosemide 40 MG tablet  Commonly known as:  LASIX  Take 40 mg by mouth 2 (two) times daily.     GLUCERNA 1.5 CAL PO  Take 1 Can by mouth 3 (three) times daily. If patient consumes less than 50% of meals     insulin aspart 100 UNIT/ML injection  Commonly known as:  novoLOG  Inject 3 Units into the skin once. For CBG over 150     insulin detemir 100 UNIT/ML injection  Commonly known as:  LEVEMIR  Inject 12 Units into the skin at bedtime.     lactobacillus Pack  Take 1 Fitzgerald by mouth 2 (two) times daily.     levothyroxine 100 MCG tablet  Commonly known as:  SYNTHROID, LEVOTHROID  Take 100 mcg by mouth daily before breakfast.     LORazepam 1 MG tablet  Commonly known as:  ATIVAN  Take one tablet by mouth or per tube every 6 hours as needed for anxiety     potassium chloride SA 20 MEQ tablet  Commonly known as:  K-DUR,KLOR-CON  Take 20 mEq by mouth daily.     sertraline 25 MG tablet  Commonly known as:  ZOLOFT  Take 75 mg by mouth daily.          Discharged Condition: fair  Time spent on discharge greater than 40 minutes.  Vital signs at Discharge. Temp:  [98.5 F (36.9 C)-98.8 F (37.1 C)] 98.8 F (37.1 C)  (05/08 0353) Pulse Rate:  [78-114] 91 (05/08 0803) Resp:  [20-37] 30 (05/08 0803) BP: (70-192)/(32-90) 154/56 mmHg (05/08 0356) SpO2:  [74 %-100 %] 100 % (05/08 0803) FiO2 (%):  [40 %] 40 % (05/08 0803) Weight:  [68.8 kg (151 lb 10.8 oz)] 68.8 kg (151 lb 10.8 oz) (05/08 0500) Office follow up Special Information or instructions. NOTE: She will need TPN per [facility] formulary.  Per [facility] Signed: [name] Minor ACNP Adolph Pollack PCCM Pager [phone] till 3 pm If no answer page [phone] 07/08/2013, 8:37 AM

## 2013-07-07 NOTE — Progress Notes (Signed)
UR Completed.  [name] [name] [phone] 07/07/2013  

## 2013-07-07 NOTE — Evaluation (Signed)
Physical Therapy Evaluation Patient Details Name: Gwenlyn FoundShirley G Bruinsma MRN: [id number] DOB: 05/30/1932 Today's Date: 07/07/2013   History of Present Illness  80 y/o chronically ill woman with [facility].  She is s/p Trach and PEG in aftermath of gastroenteritis + ARDS, retroperitoneal bleed, pneumothorax; then PNA , septic and hemorrhagic shock. She was weaned to ATC at [facility] and was discharged to SNF for further care. Her other chronic medical problems > Heart block s/p pacer placement (100% paced), hypothyroidism, DM, A Fib, CKD. She was admitted with fever, abdominal pain and erythema of her abdominal wall 5/1. Was found to have subcutaneous tissue infection due to malpositioned PEG requiring significant debridement 5/1 and 5/5.  Clinical Impression  Pt admitted with above. Pt currently with functional limitations due to the deficits listed below (see PT Problem List).  Pt will benefit from skilled PT to increase their independence and safety with mobility to allow discharge to the venue listed below. Will follow pt for incr alertness and ability to participated in mobility.      Follow Up Recommendations LTACH    Equipment Recommendations  None recommended by PT    Recommendations for Other Services       Precautions / Restrictions Precautions Precautions: Fall Precaution Comments: lines/tubes      Mobility  Bed Mobility                  Transfers                    Ambulation/Gait                Stairs            Wheelchair Mobility    Modified Rankin (Stroke Patients Only)       Balance                                             Pertinent Vitals/Pain Pt on vent. VSS    Home Living Family/patient expects to be discharged to:: Other (Comment) (LTACH)                      Prior Function           Comments: Unsure of  prior functional status     Hand Dominance        Extremity/Trunk Assessment   Upper Extremity Assessment: RUE deficits/detail;LUE deficits/detail RUE Deficits / Details: PROM WFL but no active movement noted     LUE Deficits / Details: PROM WFL but no active movement noted   Lower Extremity Assessment: RLE deficits/detail;LLE deficits/detail RLE Deficits / Details: PROM WFL except dorsiflexion -10 degrees from neutral but no active movement noted LLE Deficits / Details: PROM WFL except dorsiflexion -10 degrees from neutrabut no active movement noted     Communication   Communication: Tracheostomy  Cognition Arousal/Alertness: Lethargic   Overall Cognitive Status: No family/caregiver present to determine baseline cognitive functioning Area of Impairment: Following commands               General Comments: Pt not following any commands or responding in any way.    General Comments      Exercises        Assessment/Plan    PT Assessment Patient needs continued  PT services  PT Diagnosis Generalized weakness;Altered mental status   PT Problem List Decreased strength;Decreased activity tolerance;Decreased mobility;Decreased cognition  PT Treatment Interventions Functional mobility training;Therapeutic activities;Therapeutic exercise;Balance training;Patient/family education   PT Goals (Current goals can be found in the Care Plan section) Acute Rehab PT Goals Patient Stated Goal: Pt unable to state PT Goal Formulation: Patient unable to participate in goal setting Time For Goal Achievement: 07/21/13 Potential to Achieve Goals: Fair    Frequency Min 2X/week   Barriers to discharge        Co-evaluation               End of Session   Activity Tolerance: Patient limited by lethargy Patient left: in bed           Time: 1432-1444 PT Time Calculation (min): 12 min   Charges:   PT Evaluation $Initial PT Evaluation Tier I: 1 Procedure     PT G  CodesAngelina Ok Arron Mcnaught 07/07/2013, 4:58 PM  [employer] PT [phone]

## 2013-07-07 NOTE — Clinical Social Work Note (Addendum)
CSW spoke with Bjorn Loser at [place], [place] who reports that pt has used a total of 17 Medicare days and has 83 available to use.  [facility] reports that pt has an oversized private room at [place] [employer], SNF will hold this room for the pt.  Pt disposition remains unclear whether LTACH or SNF at this point.  CSW will keep [facility] up-to-date on disposition status.  RNCM aware.  Vickii Penna, LCSWA [phone]  Clinical Social Work

## 2013-07-07 NOTE — Progress Notes (Signed)
I have seen and examined the patient and agree with the assessment and plans.  Najiyah Paris A. Fiora Weill  MD, FACS  

## 2013-07-07 NOTE — Progress Notes (Addendum)
PARENTERAL NUTRITION CONSULT NOTE - Follow Up  Pharmacy Consult for TPN Indication: gastric contents leaking around G-tube.   Allergies  Allergen Reactions  . Beta Adrenergic Blockers Other (See Comments)    Not working well for pt.  Sherrie Mustache. Bystolic [Nebivolol Hcl] Shortness Of Breath  . Carvedilol Shortness Of Breath    Chest  Discomfort.  . Dapsone Other (See Comments)    Kidney  failure  . Diclofenac Sodium Other (See Comments)    Causes  Fluid  Retention.  Consuelo Pandy. Livalo [Pitavastatin Calcium] Shortness Of Breath  . Methotrexate Shortness Of Breath    Mouth sores.  . Metoprolol Succinate Hives  . Oxaprozin Diarrhea  . Other     Fragrance mixtures -  Cinol botanicals  &  Plants oil. Cocamidoproy   -   Betaine. Lipstick & other makeup & cleaning materials.   Marland Kitchen. Epinephrine    Patient Measurements: Height: 5' 2.99" (160 cm) Weight: 154 lb 8.7 oz (70.1 kg) IBW/kg (Calculated) : 52.38 Adjusted Body Weight: 58.2 kg  Vital Signs: Temp: 98.5 F (36.9 C) (05/07 0700) Temp src: Oral (05/07 0700) BP: 155/79 mmHg (05/07 0930) Pulse Rate: 93 (05/07 0930) Intake/Output from previous day: 05/06 0701 - 05/07 0700 In: 2623.9 [I.V.:208.9; IV Piggyback:225; TPN:2160] Out: 1635 [Urine:1635] Intake/Output from this shift: Total I/O In: 265 [I.V.:20; IV Piggyback:75; TPN:170] Out: 450 [Urine:450]  Labs: No results found for this basename: WBC, HGB, HCT, PLT, APTT, INR,  in the last 72 hours  Recent Labs  07/05/13 0430 07/05/13 1355 07/06/13 0500 07/07/13 0500  NA 141  --  139 139  K 3.8  --  3.8 3.6*  CL 102  --  101 100  CO2 28  --  27 25  GLUCOSE 122*  --  139* 165*  BUN 16  --  24* 29*  CREATININE 0.83  --  0.81 0.80  CALCIUM 8.6  --  8.3* 8.4  MG 2.0  --   --  1.9  PHOS 3.3  --   --  3.2  PROT  --   --   --  5.6*  ALBUMIN  --   --   --  1.5*  AST  --   --   --  16  ALT  --   --   --  9  ALKPHOS  --   --   --  89  BILITOT  --   --   --  0.3  TRIG  --  229*  --   --     Estimated Creatinine Clearance: 53.6 ml/min (by C-G formula based on Cr of 0.8).   Recent Labs  07/06/13 2316 07/07/13 0410 07/07/13 0812  GLUCAP 173* 170* 168*   Insulin Requirements in the past 24 hours:  On levemir 12 qhs PTA, on SSI currently with 7 units given overnight; cbgs all are < 200 with 15 units insulin per liter in TPN  Current Nutrition:  Vital HP at 20 ml/hr stopped due to leaking G-tube on 5/3, NPO Clinimix E 5-15 at 40 ml/hr + lipids 10 ml/hr NOT started on 5/3 due to no IV access 5/4 clinimix E 5-15 at 40 ml/rh + lipids at 10 ml/hr 5/5 Clinimix E 5-15 at 80 ml/hr + lipids at 10 ml/hr on MWF only provide daily average of 96 gm protein and 1568 kcals 5/6 Clinimix E 5-15 at 80 ml/hr + lipids at 5510ml/hr MWF 5/7 Clinimix E 5-15 at 80 ml/hr + lipids at 5210ml/hr MWF  GI: Abd: Wound with exposed penrose drains and gastric contents still leaking around G-tube into the wound. Tube feedings in wound. She was taking back to the OR 5/5 for wound debridement. No plans for G-tube feedings yet.  She is having BM with one charted today - functional gut.   Lytes: Hypokalemia still - will supplement with 3 runs of K+.  Renal: Creatinine remains stable, she has an est. crcl of 104ml/min.  UOP ok at 0.9 ml/kg/hr.     Trig: Elevated 244 (5/4) >229 5/5.  She was placed on propofol drip after OR yesterday but this has now been discontinued.  Re-check triglycerides on Monday  Nutrition: baseline preab = 4, reflects critical illness, inflammatory state  Assessment:  Pharmacy consulted to start TPN in this 80 yo F due to G-tube leaking into wound.  Unable to start 5/3 due to unable to place PICC line.  5/4: Tolerated first day well.  5/7 - Drains remain in place (Gastrostomy/Enterostomy (PEG) LLQ, Open drain to abdomen, Rectal pouch and urethral catheter)  Nutritional Goals: per RD note 5/4 1500-1700 kCal, >/= 100 gm protein per day  Access: PICC ordered  5/3 for TPN, unable to place by  [facility] team, placed by IR 5/4  TPN day #4  Plan:  1. Continue Clinimix E 5-15 at 80 ml/hr + lipids at 10 ml/hr on MWF to provide daily average of 96 gm protein and 1568 kcals.  This provides ~ 96 % protein and 100% kcal needs 2. Continue 30 units of insulin to TPN and increase SSI to moderate scale and continue CBGs q4h for now 3. MVI daily and trace elements MWF due to shortage 4. Give KCL IV x 2 (replaced by renal) and f/u am labs.  Nadara Mustard, PharmD., MS Clinical Pharmacist Pager:  [phone] Thank you for allowing pharmacy to be part of this patients care team. 07/07/2013 9:45 AM

## 2013-07-07 NOTE — Progress Notes (Signed)
Pt with three episodes of desaturations while on trach collar with the lowest being down to the 50%s.  RN and RT were at bedside for the events.  Large amt of very thick, tan large mucus plugs were suctioned out.  Dr. Sung Amabile called and gave orders to place pt back on vent.  Pt placed back on previous settings: PRVC, VT 500, RR 15, PEEP 5, FiO2 40%.  VS are stable at this time with tachcardia in the 120's at time of events.  Pt tolerating vent well at this time.

## 2013-07-07 NOTE — Progress Notes (Signed)
Patient ID: [name], female   DOB: 08/26/1932, 80 y.o.   MRN: [id number] 2 Days Post-Op  Subjective: Pt on tach collar at 28%.    Objective: Vital signs in last 24 hours: Temp:  [97.8 F (36.6 C)-98.8 F (37.1 C)] 98.5 F (36.9 C) (05/07 0700) Pulse Rate:  [84-117] 89 (05/07 0830) Resp:  [16-33] 20 (05/07 0830) BP: (89-186)/(33-104) 171/56 mmHg (05/07 0700) SpO2:  [83 %-100 %] 93 % (05/07 0830) FiO2 (%):  [28 %-40 %] 28 % (05/07 0830) Weight:  [154 lb 8.7 oz (70.1 kg)] 154 lb 8.7 oz (70.1 kg) (05/07 0400) Last BM Date: 07/06/13  Intake/Output from previous day: 05/06 0701 - 05/07 0700 In: 2623.9 [I.V.:208.9; IV Piggyback:225; TPN:2160] Out: 1635 [Urine:1635] Intake/Output this shift: Total I/O In: 162.5 [I.V.:10; IV Piggyback:62.5; TPN:90] Out: 450 [Urine:450]  PE: Abd: soft, wound is cleaning up nicely.  She has some redness around her wound that is c/w yeast as opposed to erythema from infection.  G-tube in place and to gravity.  Lab Results:  No results found for this basename: WBC, HGB, HCT, PLT,  in the last 72 hours BMET  Recent Labs  07/06/13 0500 07/07/13 0500  NA 139 139  K 3.8 3.6*  CL 101 100  CO2 27 25  GLUCOSE 139* 165*  BUN 24* 29*  CREATININE 0.81 0.80  CALCIUM 8.3* 8.4   PT/INR No results found for this basename: LABPROT, INR,  in the last 72 hours CMP     Component Value Date/Time   NA 139 07/07/2013 0500   K 3.6* 07/07/2013 0500   CL 100 07/07/2013 0500   CO2 25 07/07/2013 0500   GLUCOSE 165* 07/07/2013 0500   BUN 29* 07/07/2013 0500   CREATININE 0.80 07/07/2013 0500   CALCIUM 8.4 07/07/2013 0500   PROT 5.6* 07/07/2013 0500   ALBUMIN 1.5* 07/07/2013 0500   AST 16 07/07/2013 0500   ALT 9 07/07/2013 0500   ALKPHOS 89 07/07/2013 0500   BILITOT 0.3 07/07/2013 0500   GFRNONAA 68* 07/07/2013 0500   GFRAA 79* 07/07/2013 0500   Lipase     Component Value Date/Time   LIPASE 110* 04/13/2013 1800       Studies/Results: No results  found.  Anti-infectives: Anti-infectives   Start     Dose/Rate Route Frequency Ordered Stop   07/04/13 0400  vancomycin (VANCOCIN) IVPB 750 mg/150 ml premix  Status:  Discontinued     750 mg 150 mL/hr over 60 Minutes Intravenous Every 18 hours 07/03/13 1558 07/04/13 1152   07/01/13 1500  vancomycin (VANCOCIN) IVPB 750 mg/150 ml premix  Status:  Discontinued     750 mg 150 mL/hr over 60 Minutes Intravenous Every 12 hours 07/01/13 0508 07/03/13 1546   07/01/13 1000  piperacillin-tazobactam (ZOSYN) IVPB 3.375 g     3.375 g 12.5 mL/hr over 240 Minutes Intravenous 3 times per day 07/01/13 0508     07/01/13 0300  piperacillin-tazobactam (ZOSYN) IVPB 3.375 g     3.375 g 100 mL/hr over 30 Minutes Intravenous  Once 07/01/13 0258 07/01/13 0355   07/01/13 0300  vancomycin (VANCOCIN) IVPB 1000 mg/200 mL premix     1,000 mg 200 mL/hr over 60 Minutes Intravenous  Once 07/01/13 0258 07/01/13 0413       Assessment/Plan  1. POD 6/2, s/p debridment of abdominal wall, placement of gastric feeding tube, subcutaneous drain placement - 07/01/2013 - A. Derrell Lollingamirez, 07-05-13 D. Newman  2. Dislodged PEG with abdominal wall cellulitis from  tube feeds in subcut space  3. VDRF, now trying trach collar  4. Complete heart block - pacer dependent  5. History of anoxic brain injury  6. Diabetes mellitus   Plan: 1. Wound cleaning up nicely.  Cont BID dressing changes 2. Cont TNA and hold PEG tube feeds for now as this was leaking into wound earlier. 3. Add yeast powder around wound edges.  LOS: 6 days    Letha Cape 07/07/2013, 8:59 AM Pager: [phone]

## 2013-07-07 NOTE — Progress Notes (Signed)
PULMONARY / CRITICAL CARE MEDICINE   Name: [name] MRN: [id number] DOB: 04/19/1932    ADMISSION DATE:  07/01/2013 CONSULTATION DATE:  5./1/15  REFERRING MD :  Dr Derrell Lollingamirez PRIMARY SERVICE: [facility] >> PCCM  CHIEF COMPLAINT:  Abdominal pain REASON FOR CONSULTATION: Vent Management, Severe Sepsis  BRIEF PATIENT DESCRIPTION:  80 y/o chronically ill woman with [facility].  She is s/p Trach and PEG in aftermath of gastroenteritis + ARDS, retroperitoneal bleed, pneumothorax; then PNA (with MDR pseudomonas), septic and hemorrhagic shock. She was weaned to ATC at [facility] and was discharged to SNF for further care. Her other chronic medical problems > Heart block s/p pacer placement (100% paced), hypothyroidism, DM, A Fib, CKD. She was admitted with fever, abdominal pain and erythema of her abdominal wall 5/1. Was found to have subcutaneous tissue infection due to malpositioned PEG requiring significant debridement 5/1. She returned to ICU on MV, hemodynamically stable.   SIGNIFICANT EVENTS / STUDIES:  5/01 CT abd/pelvis: extensive gas in sq tissue consistent with infection, PEG tube out of stomach and into the subcutaneous fat, improved iliopsoas hematoma, possible cholelithiasis, L adrenal mass, small RLL infiltrate 5/01 OR for debridement and irrigation of abd wall, placement of G tube, sub Q drain placement 5/03 leaking PEG tube, TFs held 5/04 PICC placed for TPN. Tolerating ATC. Off vasopressors. Mildly agitated but + F/C and interacting with husband 5/05 To OR for more extensive washout and debridement of abdominal wall abscess/cellulitis. Left on vent post op 5/07 Episode of dyspnea, hypoxemia and mucus plugging. CXR: increased B AS Dz. Received Lasix X 1 dose. Accepted for transfer to [facility]. Planned for 5/08  LINES / TUBES: R femoral CVL 5/01 >> 5/04 Trach 2/18  >>  G tube 04/2013 >>  RUE PICC 5/04 >>    CULTURES: MRSA PCR 5/01 >> NEG Resp 5/01 >> NOF Urine 5/01 >> Klebsiella (sens to Zosyn) Blood 5/01  >> NEG   ANTIBIOTICS: Vancomycin 5/1 >> 5/04 Pip/tazo 5/1 >> (10 days planned)  SUBJECTIVE:  Agitated at time. + F/C  VITAL SIGNS: Temp:  [97.8 F (36.6 C)-98.8 F (37.1 C)] 98.5 F (36.9 C) (05/07 1220) Pulse Rate:  [84-117] 93 (05/07 0930) Resp:  [16-33] 32 (05/07 0930) BP: (89-186)/(33-104) 155/79 mmHg (05/07 0930) SpO2:  [83 %-100 %] 92 % (05/07 0930) FiO2 (%):  [28 %-40 %] 40 % (05/07 1220) Weight:  [70.1 kg (154 lb 8.7 oz)] 70.1 kg (154 lb 8.7 oz) (05/07 0400)  VENTILATOR SETTINGS: Vent Mode:  [-] PRVC FiO2 (%):  [28 %-40 %] 40 % Set Rate:  [15 bmp] 15 bmp Vt Set:  [500 mL] 500 mL PEEP:  [5 cmH20] 5 cmH20  INTAKE / OUTPUT: Intake/Output     05/06 0701 - 05/07 0700 05/07 0701 - 05/08 0700   I.V. (mL/kg) 208.9 (3) 20 (0.3)   Other 30    IV Piggyback 225 75   TPN 2160 170   Total Intake(mL/kg) 2623.9 (37.4) 265 (3.8)   Urine (mL/kg/hr) 1635 (1) 450 (0.8)   Total Output 1635 450   Net +988.9 -185        Stool Occurrence 2 x      PHYSICAL EXAMINATION: General: RASS +1 Neuro:  No focal deficits HEENT:  WNL Cardiovascular:  Regular, paced, no M Lungs:  Si coarse bilaterally Abdomen: Soft, dressing intact Ext:  No edema, warm  LABS: I have reviewed all  of today's lab results. Relevant abnormalities are discussed in the A/P section  CXR: Slight increase in moderate congestive heart failure with small bilateral pleural effusions. New or worsening bibasilar Airspace disease, likely atelectasis. Suspect developing airspace disease in the left mid lung laterally. Likely alveolar edema.   ASSESSMENT / PLAN:  PULMONARY A:  Acute on chronic respiratory failure Tracheostomy status P:   Vent settings reviewed and adjusted as needed Cont PSV > ATC as long as tolerated SLP eval for PMV trials when back on ATC Supp O2 to maintain SpO2 >  92%  CARDIOVASCULAR A:  CAF, rate controlled Complete heart block s/p pacer - per family, pacemaker battery is to be replaced soon S/P cardiac arrest 2-05/2013 due to mucus plugging P:  Monitor rhythm and BP Will need to discuss with Cardiology re PM battery replacement  RENAL A:   Acute renal insufficiency, resolved Mild hypervolemia P:   Monitor BMET intermittently Monitor I/Os Correct electrolytes as indicated   GASTROINTESTINAL A:   Malpositioned PEG S/p debridement abd wall infection 5/01, 5/05 Protein-cal malnutrition  P:   SUP: famotidine in TPN Cont TPN G-Tube to gravity drainage per CCS Further mgmt per CCS  HEMATOLOGIC A:   Mild anemia without acute blood loss P:  DVT px: LMWH Monitor CBC intermittently Transfuse per usual ICU guidelines  INFECTIOUS A:   Severe Sepsis, resolved Abdominal wall cellulitis UTI, resolved P:   Micro and abx as above Cont to follow PCT intermittently to determine timing of abx discontinuation  ENDOCRINE A:   DM II Hypothyroidism   P:   Cont IV levothyroxine Cont SSI Holding Levemir  NEUROLOGIC A:   Recent anoxic brain injury  Post op pain Chronic clonazepam use ICU associated agitation P:   Low dose scheduled lorazepam Cont fentanyl patch Cont PRN lorazepam, fent   GLOBAL: Husband updated @ bedside   I have personally obtained a history, examined the patient, evaluated laboratory and imaging results, formulated the assessment and plan and placed orders  CRITICAL CARE: The patient is critically ill with multiple organ systems failure and requires high complexity decision making for assessment and support, frequent evaluation and titration of therapies, application of advanced monitoring technologies and extensive interpretation of multiple databases. Critical Care Time devoted to patient care services described in this note is 30 minutes.   Billy Fischer, MD ; [facility] [phone].  After  5:30 PM or weekends, call [phone]

## 2013-07-07 NOTE — Progress Notes (Signed)
SLP Cancellation Note  Patient Details Name: MARYFRANCES EDMOND MRN: [id number] DOB: Oct 03, 1932   Cancelled treatment:       Reason Eval/Treat Not Completed: Medical issues which prohibited therapy (Patient back on vent. Will f/u 5/8. )  Ferdinand Lango MA, CCC-SLP [phone]  Denton Derks Meryl Safa Derner 07/07/2013, 2:32 PM

## 2013-07-08 ENCOUNTER — Inpatient Hospital Stay
Admission: AD | Admit: 2013-07-08 | Discharge: 2013-08-15 | Disposition: A | Discharge status: Skilled Nursing Facility | Payer: Medicare Other | Source: Ambulatory Visit | Attending: Internal Medicine | Admitting: Internal Medicine

## 2013-07-08 ENCOUNTER — Other Ambulatory Visit (HOSPITAL_COMMUNITY): Payer: Self-pay

## 2013-07-08 ENCOUNTER — Encounter (HOSPITAL_COMMUNITY): Payer: Self-pay | Admitting: Surgery

## 2013-07-08 DIAGNOSIS — R410 Disorientation, unspecified: Secondary | ICD-10-CM

## 2013-07-08 DIAGNOSIS — J9620 Acute and chronic respiratory failure, unspecified whether with hypoxia or hypercapnia: Secondary | ICD-10-CM

## 2013-07-08 DIAGNOSIS — N179 Acute kidney failure, unspecified: Secondary | ICD-10-CM

## 2013-07-08 DIAGNOSIS — Z930 Tracheostomy status: Secondary | ICD-10-CM

## 2013-07-08 DIAGNOSIS — L03311 Cellulitis of abdominal wall: Secondary | ICD-10-CM

## 2013-07-08 DIAGNOSIS — R6520 Severe sepsis without septic shock: Principal | ICD-10-CM

## 2013-07-08 DIAGNOSIS — N189 Chronic kidney disease, unspecified: Secondary | ICD-10-CM

## 2013-07-08 DIAGNOSIS — A419 Sepsis, unspecified organism: Secondary | ICD-10-CM

## 2013-07-08 DIAGNOSIS — Z931 Gastrostomy status: Secondary | ICD-10-CM

## 2013-07-08 LAB — BASIC METABOLIC PANEL
BUN: 32 mg/dL — ABNORMAL HIGH (ref 6–23)
CALCIUM: 8.2 mg/dL — AB (ref 8.4–10.5)
CO2: 27 meq/L (ref 19–32)
Chloride: 104 mEq/L (ref 96–112)
Creatinine, Ser: 0.88 mg/dL (ref 0.50–1.10)
GFR calc Af Amer: 71 mL/min — ABNORMAL LOW (ref >90)
GFR, EST NON AFRICAN AMERICAN: 61 mL/min — AB (ref >90)
Glucose, Bld: 157 mg/dL — ABNORMAL HIGH (ref 70–99)
POTASSIUM: 4 meq/L (ref 3.7–5.3)
SODIUM: 141 meq/L (ref 137–147)

## 2013-07-08 LAB — GLUCOSE, CAPILLARY
GLUCOSE-CAPILLARY: 131 mg/dL — AB (ref 70–99)
GLUCOSE-CAPILLARY: 148 mg/dL — AB (ref 70–99)
Glucose-Capillary: 133 mg/dL — ABNORMAL HIGH (ref 70–99)
Glucose-Capillary: 132 mg/dL — ABNORMAL HIGH (ref 70–99)

## 2013-07-08 MED ORDER — INSULIN ASPART 100 UNIT/ML ~~LOC~~ SOLN: 0.0000 [IU] | SUBCUTANEOUS | Status: AC

## 2013-07-08 MED ORDER — PIPERACILLIN-TAZOBACTAM 3.375 G IVPB: 3.3750 g | Freq: Three times a day (TID) | INTRAVENOUS | Status: AC

## 2013-07-08 MED ORDER — LORAZEPAM 2 MG/ML IJ SOLN: 0.5000 mg | INTRAMUSCULAR | Status: AC

## 2013-07-08 MED ORDER — LEVOTHYROXINE SODIUM 100 MCG IV SOLR: 50.0000 ug | Freq: Every day | INTRAVENOUS | Status: AC

## 2013-07-08 MED ORDER — FENTANYL 100 MCG/HR TD PT72: 100.0000 ug | MEDICATED_PATCH | TRANSDERMAL | Status: AC

## 2013-07-08 MED ORDER — ENOXAPARIN SODIUM 40 MG/0.4ML ~~LOC~~ SOLN: 40.0000 mg | SUBCUTANEOUS | Status: AC

## 2013-07-08 MED ORDER — ACETAMINOPHEN 160 MG/5ML PO SOLN: 650.0000 mg | ORAL | Status: AC | PRN

## 2013-07-08 MED ORDER — FUROSEMIDE 10 MG/ML IJ SOLN: 40.0000 mg | Freq: Two times a day (BID) | INTRAMUSCULAR | Status: AC

## 2013-07-08 MED ORDER — SODIUM CHLORIDE 0.9 % IV SOLN: 10.0000 mL | INTRAVENOUS | Status: AC

## 2013-07-08 NOTE — Progress Notes (Addendum)
PARENTERAL NUTRITION CONSULT NOTE - FOLLOW UP  Pharmacy Consult for TPN Indication: Gastric contents leaking around G-tube  Allergies  Allergen Reactions  . Beta Adrenergic Blockers Other (See Comments)    Not working well for pt.  Sherrie Mustache Hcl] Shortness Of Breath  . Carvedilol Shortness Of Breath    Chest  Discomfort.  . Dapsone Other (See Comments)    Kidney  failure  . Diclofenac Sodium Other (See Comments)    Causes  Fluid  Retention.  Consuelo Pandy [Pitavastatin Calcium] Shortness Of Breath  . Methotrexate Shortness Of Breath    Mouth sores.  . Metoprolol Succinate Hives  . Oxaprozin Diarrhea  . Other     Fragrance mixtures -  Cinol botanicals  &  Plants oil. Cocamidoproy   -   Betaine. Lipstick & other makeup & cleaning materials.   Marland Kitchen Epinephrine     Patient Measurements: Height: 5' 2.99" (160 cm) Weight: 151 lb 10.8 oz (68.8 kg) IBW/kg (Calculated) : 52.38 Adjusted Body Weight:  Usual Weight:   Vital Signs: Temp: 97.2 F (36.2 C) (05/08 0803) Temp src: Oral (05/08 0803) BP: 154/56 mmHg (05/08 0356) Pulse Rate: 91 (05/08 0803) Intake/Output from previous day: 05/07 0701 - 05/08 0700 In: 2225 [I.V.:210; IV Piggyback:325; TPN:1690] Out: 3555 [Urine:3455; Drains:50; Stool:50] Intake/Output from this shift:    Labs: No results found for this basename: WBC, HGB, HCT, PLT, APTT, INR,  in the last 72 hours   Recent Labs  07/05/13 1355 07/06/13 0500 07/07/13 0500 07/08/13 0455  NA  --  139 139 141  K  --  3.8 3.6* 4.0  CL  --  101 100 104  CO2  --  27 25 27   GLUCOSE  --  139* 165* 157*  BUN  --  24* 29* 32*  CREATININE  --  0.81 0.80 0.88  CALCIUM  --  8.3* 8.4 8.2*  MG  --   --  1.9  --   PHOS  --   --  3.2  --   PROT  --   --  5.6*  --   ALBUMIN  --   --  1.5*  --   AST  --   --  16  --   ALT  --   --  9  --   ALKPHOS  --   --  89  --   BILITOT  --   --  0.3  --   TRIG 229*  --   --   --    Estimated Creatinine Clearance: 48.3  ml/min (by C-G formula based on Cr of 0.88).    Recent Labs  07/08/13 0047 07/08/13 0352 07/08/13 0854  GLUCAP 131* 148* 133*    Medications:  Scheduled:  . antiseptic oral rinse  15 mL Mouth Rinse QID  . chlorhexidine  15 mL Mouth Rinse BID  . enoxaparin (LOVENOX) injection  40 mg Subcutaneous Q24H  . [START ON 07/10/2013] fentaNYL  100 mcg Transdermal Q72H  . furosemide  40 mg Intravenous Q12H  . insulin aspart  0-15 Units Subcutaneous 6 times per day  . levothyroxine  50 mcg Intravenous Daily  . LORazepam  0.5 mg Intravenous Q4H  . piperacillin-tazobactam (ZOSYN)  IV  3.375 g Intravenous 3 times per day    Insulin Requirements in the past 24 hours:  SSI currently with 7 units given overnigh and 15 units insulin per liter in TPN   Nutritional Goals: per RD note 5/4  1500-1700 kCal, >/= 100 gm protein per day   Current Nutrition:  Clinimix E 5-15 at 80 ml/hr + lipids at 5410ml/hr MWF   Assessment:   Pharmacy consulted to start TPN in this 80 yo F due to G-tube leaking into wound. Unable to start 5/3 due to unable to place PICC line. 5/4: Tolerated first day well.  5/8 - Drains remain in place (Gastrostomy/Enterostomy (PEG) LLQ, Open drain to abdomen, Rectal pouch and urethral catheter).  Planned transfer to [facility] today; they will not use our TPN.  GI: Abd: Wound with exposed penrose drains and gastric contents still leaking around G-tube into the wound. Extravasation of TF into SQ tissues. Back to OR 5/5 for wound debridement. Wound looks much better today.  No plans for G-tube feedings yet. She is having BM with one charted today - functional gut.  Endo:  CBG 131-164.   Lytes: K  4, all lytes wnl  Renal: Cr < 1, CrCl ~ 50. UOP 2.1 ml/kg/hr yest  Trig: Elevated 244 (5/4) >229 5/5. She was placed on propofol drip after OR yesterday but this has now been discontinued. Re-check triglycerides on Monday   ID:  Zosyn- abd wall cellulitis.  AFeb  Nutrition: baseline preab =  4, reflects critical illness, inflammatory state   Access: PICC ordered 5/3 for TPN, unable to place by [facility] team, placed by IR 5/4  TPN day #4   Plan:  1. Continue Clinimix E 5-15 at 80 ml/hr + lipids at 10 ml/hr on MWF to provide daily average of 96 gm protein and 1568 kcals. This provides ~ 96 % protein and 100% kcal needs  2. Continue 30 units of insulin to TPN and increase SSI to moderate scale and continue CBGs q4h for now  3. MVI and Trace Elements daily (back order resolved, for now) 4. Verify certainty of [facility] transfer (currently rep with family) and need to enter TPN order   Marisue HumbleKendra Laruth Hanger, PharmD Clinical Pharmacist [facility]

## 2013-07-08 NOTE — Discharge Summary (Signed)
Physician Discharge Summary   Patient ID:  [name]  MRN: [id number]  DOB/AGE: 78/11/1932 78 y.o.  Admit date: 07/01/2013  Discharge date: 07/08/2013  Problem List  Principal Problem:  Severe sepsis  Active Problems:  Unspecified hypothyroidism  Acute on chronic respiratory failure  Tracheostomy status  PEG (percutaneous endoscopic gastrostomy) status  Abdominal wall cellulitis  Lactic acidosis   HPI:  80 y/o F admitted 2/2 with norovirus gastroenteritis,respiratory failure, hypovolemic and presumed septic shock - source unclear. Course complicated by ARDS, AKI. 2/8 am suffered mucus plugging with ETT obstruction with subsequent cardiac arrest. Required CPR x 9 mins, improved with re-intubation & left pneumothorax decompression. Failed extubation on 2/18 due to severe deconditioning & hypoxia & required tstomy. Unfortunately, she has had ongoing fevers that have not had clear defervescence. Cultures & imaging are negative for overt ongoing infectious process. [facility]/18 [facility] with ARDS non specific lung disease (? Viral pneumonitis, amiodarone or other drug toxicity). 2/27 she was seen by PCCM and was working towards trach collar and was doing well. 3/2 patient became hypotensive, pale, tachycardic and a hemoglobin drop to 6 was noted. She was hypotensive and felt to have hemorrhagic shock, Transferred back to ICU.  Hospital Course:  SIGNIFICANT EVENTS / STUDIES:  5/01 CT abd/pelvis: extensive gas in sq tissue consistent with infection, PEG tube out of stomach and into the subcutaneous fat, improved iliopsoas hematoma, possible cholelithiasis, L adrenal mass, small RLL infiltrate  5/01 OR for debridement and irrigation of abd wall, placement of G tube, sub Q drain placement  5/03 leaking PEG tube, TFs held  5/04 PICC placed for TPN. Tolerating ATC. Off vasopressors. Mildly agitated but + F/C and interacting with husband  5/05 To OR for more extensive washout and  debridement of abdominal wall abscess/cellulitis. Left on vent post op  5/07 Episode of dyspnea, hypoxemia and mucus plugging. CXR: increased B AS Dz. Received Lasix X 1 dose. Accepted for transfer to [facility]. Planned for 5/08  LINES / TUBES:  R femoral CVL 5/01 >> 5/04  Trach 2/18 >>  G tube 04/2013 >>  RUE PICC 5/04 >>  CULTURES:  MRSA PCR 5/01 >> NEG  Resp 5/01 >> NOF  Urine 5/01 >> Klebsiella (sens to Zosyn)  Blood 5/01 >> NEG  ANTIBIOTICS:  Vancomycin 5/1 >> 5/04  Pip/tazo 5/1 >> (10 days planned)   VENTILATOR SETTINGS:  Vent Mode: [-] PRVC  FiO2 (%): [28 %-40 %] 40 %  Set Rate: [15 bmp] 15 bmp  Vt Set: [500 mL] 500 mL  PEEP: [5 cmH20] 5 cmH20   ASSESSMENT / PLAN:  PULMONARY  A:  Acute on chronic respiratory failure  Tracheostomy status  P:  Vent settings reviewed and adjusted as needed  Cont PSV > ATC as long as tolerated  SLP eval for PMV trials when back on ATC  Supp O2 to maintain SpO2 > 92%  CARDIOVASCULAR  A:  CAF, rate controlled  Complete heart block s/p pacer - per family, pacemaker battery is to be replaced soon  S/P cardiac arrest 2-05/2013 due to mucus plugging  P:  Monitor rhythm and BP  Will need to discuss with Cardiology re PM battery replacement  RENAL  A:  Acute renal insufficiency, resolved  Mild hypervolemia  P:  Monitor BMET intermittently  Monitor I/Os  Correct electrolytes as indicated  GASTROINTESTINAL  A:  Malpositioned PEG  S/p debridement abd wall infection 5/01, 5/05  Protein-cal malnutrition  P:  SUP: famotidine in TPN  Cont TPN  G-Tube to gravity drainage per CCS  Further mgmt per CCS  HEMATOLOGIC  A:  Mild anemia without acute blood loss  P:  DVT px: LMWH  Monitor CBC intermittently  Transfuse per usual ICU guidelines  INFECTIOUS  A:  Severe Sepsis, resolved  Abdominal wall cellulitis  UTI, resolved  P:  Micro and abx as above  Cont to follow PCT intermittently to determine timing of abx discontinuation   ENDOCRINE  A:  DM II  Hypothyroidism  P:  Cont IV levothyroxine  Cont SSI  Holding Levemir  NEUROLOGIC  A:  Recent anoxic brain injury  Post op pain  Chronic clonazepam use  ICU associated agitation  P:  Low dose scheduled lorazepam  Cont fentanyl patch  Cont PRN lorazepam, fent  GLOBAL:  Husband updated @ bedside    Labs at discharge  Lab Results   Component  Value  Date    CREATININE  0.88  07/08/2013    BUN  32*  07/08/2013    NA  141  07/08/2013    K  4.0  07/08/2013    CL  104  07/08/2013    CO2  27  07/08/2013    Lab Results   Component  Value  Date    WBC  13.7*  07/04/2013    HGB  8.9*  07/04/2013    HCT  29.2*  07/04/2013    MCV  94.8  07/04/2013    PLT  257  07/04/2013    Lab Results   Component  Value  Date    ALT  9  07/07/2013    AST  16  07/07/2013    ALKPHOS  89  07/07/2013    BILITOT  0.3  07/07/2013    Lab Results   Component  Value  Date    INR  1.45  07/01/2013    INR  1.05  05/03/2013    INR  1.63*  05/03/2013    Current radiology studies  Dg Chest Port 1 View  07/07/2013 CLINICAL DATA: Respiratory distress. EXAM: PORTABLE CHEST - 1 VIEW COMPARISON: 07/04/2013 FINDINGS: Tracheostomy appropriately positioned. Pacer/ AICD device unchanged. A right-sided PICC line is poorly visualized centrally, due to overlying pacer wires. Mild cardiomegaly, accentuated by AP portable technique. Trace bilateral pleural effusions. No pneumothorax. Interstitial edema is moderate and slightly increased. Suspect airspace disease over the left mid lung laterally. New or increased bibasilar airspace disease. IMPRESSION: Slight increase in moderate congestive heart failure with small bilateral pleural effusions. New or worsening bibasilar Airspace disease, likely atelectasis. Suspect developing airspace disease in the left mid lung laterally. Likely alveolar edema. Electronically Signed By: Jeronimo Greaves M.D. On: 07/07/2013 14:42   Disposition:  01-Home or Self Care   Future Appointments  Provider   Department  Dept Phone    11/18/2013 12:45 PM  Annamaria Boots, MD  [facility]  [phone]        Medication List     ASK your doctor about these medications       clonazePAM 0.5 MG tablet    Commonly known as: KLONOPIN    Take one tablet by mouth every 8 hours for anxiety    diphenoxylate-atropine 2.5-0.025 MG per tablet    Commonly known as: LOMOTIL    Take one tablet by mouth four times daily as needed for diarrhea    famotidine 20 MG tablet    Commonly known as: PEPCID  Take 20 mg by mouth 2 (two) times daily.    feeding supplement (PRO-STAT SUGAR FREE 64) Liqd    Take 30 mLs by mouth 3 (three) times daily with meals.    fentaNYL 50 MCG/HR    Commonly known as: DURAGESIC - dosed mcg/hr    Remove old patch and then apply one patch topically every 72 hours. Rotate sites    furosemide 40 MG tablet    Commonly known as: LASIX    Take 40 mg by mouth 2 (two) times daily.    GLUCERNA 1.5 CAL PO    Take 1 Can by mouth 3 (three) times daily. If patient consumes less than 50% of meals    insulin aspart 100 UNIT/ML injection    Commonly known as: novoLOG    Inject 3 Units into the skin once. For CBG over 150    insulin detemir 100 UNIT/ML injection    Commonly known as: LEVEMIR    Inject 12 Units into the skin at bedtime.    lactobacillus Pack    Take 1 g by mouth 2 (two) times daily.    levothyroxine 100 MCG tablet    Commonly known as: SYNTHROID, LEVOTHROID    Take 100 mcg by mouth daily before breakfast.    LORazepam 1 MG tablet    Commonly known as: ATIVAN    Take one tablet by mouth or per tube every 6 hours as needed for anxiety    potassium chloride SA 20 MEQ tablet    Commonly known as: K-DUR,KLOR-CON    Take 20 mEq by mouth daily.    sertraline 25 MG tablet    Commonly known as: ZOLOFT    Take 75 mg by mouth daily.       Discharged Condition: fair  Time spent on discharge greater than 40 minutes.  Vital signs at Discharge.  Temp:  [98.5 F (36.9 C)-98.8 F (37.1 C)] 98.8 F (37.1 C) (05/08 0353)  Pulse Rate: [78-114] 91 (05/08 0803)  Resp: [20-37] 30 (05/08 0803)  BP: (70-192)/(32-90) 154/56 mmHg (05/08 0356)  SpO2: [74 %-100 %] 100 % (05/08 0803)  FiO2 (%): [40 %] 40 % (05/08 0803)  Weight: [68.8 kg (151 lb 10.8 oz)] 68.8 kg (151 lb 10.8 oz) (05/08 0500)  Office follow up Special Information or instructions.  NOTE: She will need TPN per [facility] formulary.   Signed:  [name] Minor ACNP  Adolph Pollack PCCM  Pager [phone] till 3 pm  If no answer page [phone]  07/08/2013, 8:37 AM   PCCM ATTENDING: I have interviewed and examined the patient and reviewed the database. I have formulated the assessment and plan as reflected in the note above with amendments made by me.   PCCM will remain involved to assist in vent/trach mgmt.  She should wean from vent fairly readily She can be decannulated once fully liberated from vent for > 1 week or more and once it is certain that there are no planned procedures that require anesthesia  Billy Fischer, MD;  PCCM service; Mobile [phone]

## 2013-07-08 NOTE — Progress Notes (Signed)
General Surgery Note  LOS: 7 days  POD -  3 Days Post-Op  Assessment/Plan: 1  IRRIGATION AND DEBRIDMENT OF ABDOMINAL WOUND - POD# 7/3 - Ramirez/Katielynn Horan  Extravasation of gastric feeding in sub q tissues.  On Zosyn  The wound looks much better.  G tube coming through the wound.  2. VDRF 3. Complete heart block - pacer dependent  4. History of anoxic brain injury  5. Diabetes mellitus 6.  DVT prophylaxis - Lovenox   Principal Problem:   Severe sepsis Active Problems:   Unspecified hypothyroidism   Acute on chronic respiratory failure   Tracheostomy status   PEG (percutaneous endoscopic gastrostomy) status   Abdominal wall cellulitis   Lactic acidosis  Subjective:  Intubated. Husband not at bedside.  Objective:   Filed Vitals:   07/08/13 0356  BP: 154/56  Pulse: 88  Temp:   Resp: 27     Intake/Output from previous day:  05/07 0701 - 05/08 0700 In: 2225 [I.V.:210; IV Piggyback:325; TPN:1690] Out: 3555 [Urine:3455; Drains:50; Stool:50]  Intake/Output this shift:      Physical Exam:   General: Intubated.  HEENT: Normal. Pupils equal.   Abdomen: Soft   Wound: Much cleaner.    Lab Results:   No results found for this basename: WBC, HGB, HCT, PLT,  in the last 72 hours  BMET   Recent Labs  07/07/13 0500 07/08/13 0455  NA 139 141  K 3.6* 4.0  CL 100 104  CO2 25 27  GLUCOSE 165* 157*  BUN 29* 32*  CREATININE 0.80 0.88  CALCIUM 8.4 8.2*    PT/INR  No results found for this basename: LABPROT, INR,  in the last 72 hours  ABG  No results found for this basename: PHART, PCO2, PO2, HCO3,  in the last 72 hours   Studies/Results:  Dg Chest Port 1 View  07/07/2013   CLINICAL DATA:  Respiratory distress.  EXAM: PORTABLE CHEST - 1 VIEW  COMPARISON:  07/04/2013  FINDINGS: Tracheostomy appropriately positioned. Pacer/ AICD device unchanged. A right-sided PICC line is poorly visualized centrally, due to overlying pacer wires.  Mild cardiomegaly, accentuated by AP  portable technique. Trace bilateral pleural effusions. No pneumothorax. Interstitial edema is moderate and slightly increased. Suspect airspace disease over the left mid lung laterally. New or increased bibasilar airspace disease.  IMPRESSION: Slight increase in moderate congestive heart failure with small bilateral pleural effusions.  New or worsening bibasilar Airspace disease, likely atelectasis.  Suspect developing airspace disease in the left mid lung laterally. Likely alveolar edema.   Electronically Signed   By: Jeronimo GreavesKyle  Talbot M.D.   On: 07/07/2013 14:42     Anti-infectives:   Anti-infectives   Start     Dose/Rate Route Frequency Ordered Stop   07/04/13 0400  vancomycin (VANCOCIN) IVPB 750 mg/150 ml premix  Status:  Discontinued     750 mg 150 mL/hr over 60 Minutes Intravenous Every 18 hours 07/03/13 1558 07/04/13 1152   07/01/13 1500  vancomycin (VANCOCIN) IVPB 750 mg/150 ml premix  Status:  Discontinued     750 mg 150 mL/hr over 60 Minutes Intravenous Every 12 hours 07/01/13 0508 07/03/13 1546   07/01/13 1000  piperacillin-tazobactam (ZOSYN) IVPB 3.375 g     3.375 g 12.5 mL/hr over 240 Minutes Intravenous 3 times per day 07/01/13 0508 07/10/13 2359   07/01/13 0300  piperacillin-tazobactam (ZOSYN) IVPB 3.375 g     3.375 g 100 mL/hr over 30 Minutes Intravenous  Once 07/01/13 0258 07/01/13 0355  07/01/13 0300  vancomycin (VANCOCIN) IVPB 1000 mg/200 mL premix     1,000 mg 200 mL/hr over 60 Minutes Intravenous  Once 07/01/13 0258 07/01/13 0413      Ovidio Kin, MD, FACS Pager: [phone] [facility] Office: [phone] 07/08/2013

## 2013-07-08 NOTE — Progress Notes (Signed)
SLP Cancellation Note  Patient Details Name: [name] MRN: [id number] DOB: 1932-12-26   Cancelled treatment:       Reason Eval/Treat Not Completed: Medical issues which prohibited therapy. Pt remains on vent; for D/C to LTAC today.  Will sign off.   [name] [name] [name] [name] 07/08/2013, 11:13 AM

## 2013-07-09 LAB — CBC
HEMATOCRIT: 24.5 % — AB (ref 36.0–46.0)
HEMOGLOBIN: 7.5 g/dL — AB (ref 12.0–15.0)
MCH: 28.8 pg (ref 26.0–34.0)
MCHC: 30.6 g/dL (ref 30.0–36.0)
MCV: 94.2 fL (ref 78.0–100.0)
Platelets: 278 10*3/uL (ref 150–400)
RBC: 2.6 MIL/uL — AB (ref 3.87–5.11)
RDW: 17.1 % — ABNORMAL HIGH (ref 11.5–15.5)
WBC: 19.2 10*3/uL — AB (ref 4.0–10.5)

## 2013-07-11 ENCOUNTER — Other Ambulatory Visit (HOSPITAL_COMMUNITY): Payer: Self-pay

## 2013-07-11 LAB — BASIC METABOLIC PANEL
BUN: 18 mg/dL (ref 6–23)
CO2: 23 mEq/L (ref 19–32)
Calcium: 9.1 mg/dL (ref 8.4–10.5)
Chloride: 104 mEq/L (ref 96–112)
Creatinine, Ser: 0.71 mg/dL (ref 0.50–1.10)
GFR calc Af Amer: >90 mL/min (ref >90)
GFR calc non Af Amer: 80 mL/min — ABNORMAL LOW (ref >90)
GLUCOSE: 177 mg/dL — AB (ref 70–99)
POTASSIUM: 4.2 meq/L (ref 3.7–5.3)
Sodium: 139 mEq/L (ref 137–147)

## 2013-07-11 LAB — CBC WITH DIFFERENTIAL/PLATELET
Basophils Absolute: 0.1 10*3/uL (ref 0.0–0.1)
Basophils Relative: 0 % (ref 0–1)
EOS ABS: 0.3 10*3/uL (ref 0.0–0.7)
Eosinophils Relative: 1 % (ref 0–5)
HEMATOCRIT: 30.1 % — AB (ref 36.0–46.0)
HEMOGLOBIN: 8.9 g/dL — AB (ref 12.0–15.0)
LYMPHS ABS: 3.1 10*3/uL (ref 0.7–4.0)
Lymphocytes Relative: 15 % (ref 12–46)
MCH: 28.1 pg (ref 26.0–34.0)
MCHC: 29.6 g/dL — ABNORMAL LOW (ref 30.0–36.0)
MCV: 95 fL (ref 78.0–100.0)
MONOS PCT: 11 % (ref 3–12)
Monocytes Absolute: 2.3 10*3/uL — ABNORMAL HIGH (ref 0.1–1.0)
Neutro Abs: 14.9 10*3/uL — ABNORMAL HIGH (ref 1.7–7.7)
Neutrophils Relative %: 73 % (ref 43–77)
Platelets: 415 10*3/uL — ABNORMAL HIGH (ref 150–400)
RBC: 3.17 MIL/uL — ABNORMAL LOW (ref 3.87–5.11)
RDW: 16.7 % — ABNORMAL HIGH (ref 11.5–15.5)
WBC: 20.6 10*3/uL — ABNORMAL HIGH (ref 4.0–10.5)

## 2013-07-11 MED ORDER — IOHEXOL 300 MG/ML  SOLN
100.0000 mL | Freq: Once | INTRAMUSCULAR | Status: AC | PRN
  Administered 2013-07-11: 100 mL via INTRAVENOUS

## 2013-07-11 NOTE — Progress Notes (Signed)
[facility]                                                                                              Progress note     Patient Demographics  [name], is a 80 y.o. female  QIO:[id number]  LKG:[id number]  DOB - 11/09/1932  Admit date - 07/08/2013  Admitting Physician Elnora MorrisonAhmad B Barakat, MD  Outpatient Primary MD for the patient is Astrid DivineGRIFFIN,ELAINE COLLINS, MD  LOS - 3  Chief complaint Respiratory failure Abdominal wound      Subjective:   Angela NorrisShirley Fitzgerald obtunded cannot give any complaints  Objective:   Vital signs  Temperature 98 Heart rate 90 Respiratory rate 20 Blood pressure 130/70 Pulse ox 100%    Exam Obtunded Cunningham.AT,PERRAL Supple Neck,No JVD, No cervical lymphadenopathy appriciated.  Trach in place Symmetrical Chest wall movement, diminished bilaterally with scattered rhonchi RRR,No Gallops,Rubs or new Murmurs, No Parasternal Heave Decreased B.Sounds, abdominal binder in place. PEG tube noted No Cyanosis, Clubbing or edema, No new Rash or bruise     I&Os 2140/2505 Peg Tube Foley - yes Trach #4  Data Review   CBC  Recent Labs Lab 07/09/13 0500 07/11/13 0659  WBC 19.2* 20.6*  HGB 7.5* 8.9*  HCT 24.5* 30.1*  PLT 278 415*  MCV 94.2 95.0  MCH 28.8 28.1  MCHC 30.6 29.6*  RDW 17.1* 16.7*  LYMPHSABS  --  3.1  MONOABS  --  2.3*  EOSABS  --  0.3  BASOSABS  --  0.1    Chemistries   Recent Labs Lab 07/05/13 0430 07/06/13 0500 07/07/13 0500 07/08/13 0455 07/11/13 0659  NA 141 139 139 141 139  K 3.8 3.8 3.6* 4.0 4.2  CL 102 101 100 104 104  CO2 28 27 25 27 23   GLUCOSE 122* 139* 165* 157* 177*  BUN 16 24* 29* 32* 18  CREATININE 0.83 0.81 0.80 0.88 0.71  CALCIUM 8.6 8.3* 8.4 8.2* 9.1  MG 2.0  --  1.9  --   --   AST  --   --  16  --   --   ALT  --   --  9  --   --   ALKPHOS  --   --  89  --   --   BILITOT  --   --  0.3  --   --     ------------------------------------------------------------------------------------------------------------------ CrCl is unknown because both a height and weight (above a minimum accepted value) are required for this calculation. ------------------------------------------------------------------------------------------------------------------ No results found for this basename: HGBA1C,  in the last 72 hours ------------------------------------------------------------------------------------------------------------------ No results found for this basename: CHOL, HDL, LDLCALC, TRIG, CHOLHDL, LDLDIRECT,  in the last 72 hours ------------------------------------------------------------------------------------------------------------------ No results found for this basename: TSH, T4TOTAL, FREET3, T3FREE, THYROIDAB,  in the last 72 hours ------------------------------------------------------------------------------------------------------------------ No results found for this basename: VITAMINB12, FOLATE, FERRITIN, TIBC, IRON, RETICCTPCT,  in the last 72 hours  Coagulation profile No results found for this basename: INR, PROTIME,  in the last 168 hours  No results found for this basename: DDIMER,  in the last 72 hours  Cardiac  Enzymes No results found for this basename: CK, CKMB, TROPONINI, MYOGLOBIN,  in the last 168 hours ------------------------------------------------------------------------------------------------------------------ No components found with this basename: POCBNP,   Micro Results No results found for this or any previous visit (from the past 240 hour(s)).     Assessment & Plan   VDRF ; continue with PSV trials for 20 hours today. Failed 5/5. Sepsis continue with IV antibiotics Abdomen wound; abdominal wound care to follow Chronic atrial food status post pacemaker, rate controlled Acute on chronic renal failure; monitor creatinine Klebsiella UTI; treated Protein  calorie malnutrition continue with TPN for now will challenge to feeding by the end of the week Anemia Diabetes mellitus type 2 on Levemir and NovoLog Hypothyroidism continue with Synthroid Anoxic encephalopathy; monitor History of retroperitoneal hematoma with no signs of acute bleed History of multidrug resistant Pseudomonas pneumonia treated with tobramycin in the past.  Plan  Check CBC in a.m.  code Status: Full  Family Communication: At bedside   DVT Prophylaxis  Lovenox    Carron Curie M.D on 07/11/2013 at 11:23 AM

## 2013-07-11 NOTE — Consult Note (Signed)
Name: [name] MRN: [id number] DOB: 1932/04/18    ADMISSION DATE:  07/08/2013 CONSULTATION DATE:  5/11  REFERRING MD :  Sharyon Medicus  PRIMARY SERVICE:  ssh   CHIEF COMPLAINT:  Weaning   BRIEF PATIENT DESCRIPTION:   80 yo chronically ill woman with complicated hospitalization 2-05/2013 to [facility], [facility], [facility] and then back to [facility] before discharge to SNF. She is s/p Trach and PEG in aftermath of gastroenteritis + ARDS, retroperitoneal bleed, pneumothorax; then PNA (with MDR pseudomonas), septic and hemorrhagic shock. She was weaned to ATC at [facility] and was discharged to SNF for further care. She was admitted with fever, abdominal pain and erythema of her abdominal wall 5/1. Was found to have subcutaneous tissue infection due to malpositioned PEG requiring significant debridement 5/1. She returned to ICU on MV, hemodynamically stable. Went to OR X 2 for irrigation and washout. Placed on TPN. Transferred back to [facility] for weaning efforts    LINES / TUBES:  R femoral CVL 5/01 >> 5/04  Trach 2/18 >>  G tube 04/2013 >>  RUE PICC 5/04 >>   CULTURES:  MRSA PCR 5/01 >> NEG  Resp 5/01 >> NOF  Urine 5/01 >> Klebsiella (sens to Zosyn)  Blood 5/01 >> NEG   ANTIBIOTICS:  Vancomycin 5/1 >> 5/04  Pip/tazo 5/1 >> (10 days planned)     HISTORY OF PRESENT ILLNESS:   80 y/o F admitted 2/2 with norovirus gastroenteritis,respiratory failure, hypovolemic and presumed septic shock - source unclear. Course complicated by ARDS, AKI. 2/8 am suffered mucus plugging with ETT obstruction with subsequent cardiac arrest. Required CPR x 9 mins, improved with re-intubation & left pneumothorax decompression. Failed extubation on 2/18 due to severe deconditioning & hypoxia & required tstomy. Unfortunately, she has had ongoing fevers that have not had clear defervescence. Cultures & imaging are negative for overt ongoing infectious process. Was trached 2/18 and sent to [facility] 2/20 with ARDS non specific lung disease (? Viral  pneumonitis, amiodarone or other drug toxicity). 2/27 she was seen by PCCM and was working towards trach collar and was doing well. 3/2 patient became hypotensive, pale, tachycardic and a hemoglobin drop to 6 was noted. She was hypotensive and felt to have hemorrhagic shock, Transferred back to ICU w/ new dx of extensive abd wall cellulitis from PEG malpositioning.  Went to OR X 2 for irrigation and washout. Placed on TPN. Transferred back to [facility] for weaning efforts   PAST MEDICAL HISTORY :  Past Medical History  Diagnosis Date  . Hypothyroid   . Complete heart block 2002    Initial PPM 2002 with upgrade by JA to CRT-P 11/21/08  . Nonischemic cardiomyopathy   . Persistent atrial fibrillation   . Hypertension   . Hyperlipidemia   . Hives 4/10  . Cataract 4/13    right  . Renal failure     3rd stage-seeing neurologist  . Cardiac arrest    Past Surgical History  Procedure Laterality Date  . Pacemaker insertion  2002, 2010    PPM 2002, upgrade to CRT-P by Dr Johney Frame (MDT 2010)  . Partial colectomy  1/04  . Abdominal hysterectomy  1987    TAH  . Bunionectomy Right 1996  . Breast biopsy Left 01/1997  . Cataract extraction  4/13    both eyes  . Cyst excision      on back of left ear  . Debridement of abdominal wall abscess N/A 07/01/2013    Procedure: DEBRIDEMENT OF ABDOMINAL WALL ABSCESS,  FEEDING TUBE PLACEMENT;  Surgeon: Axel FillerArmando Ramirez, MD;  Location: [facility] OR;  Service: General;  Laterality: N/A;  . Laparotomy N/A 07/05/2013    Procedure: IRRIGATION AND DEBRIDMENT OF ABDOMINAL WOUND;  Surgeon: Kandis Cockingavid H Newman, MD;  Location: [facility] OR;  Service: General;  Laterality: N/A;   Prior to Admission medications   Medication Sig Start Date End Date Taking? Authorizing Provider  acetaminophen (TYLENOL) 160 MG/5ML solution Place 20.3 mLs (650 mg total) into feeding tube every 4 (four) hours as needed for fever. 07/08/13   Vilinda BlanksWilliam S Minor, NP  enoxaparin (LOVENOX) 40 MG/0.4ML injection Inject 0.4 mLs (40  mg total) into the skin daily. 07/08/13   Vilinda BlanksWilliam S Minor, NP  fentaNYL (DURAGESIC - DOSED MCG/HR) 100 MCG/HR Place 1 patch (100 mcg total) onto the skin every 3 (three) days. 07/10/13   Vilinda BlanksWilliam S Minor, NP  furosemide (LASIX) 10 MG/ML injection Inject 4 mLs (40 mg total) into the vein every 12 (twelve) hours. 07/08/13   Vilinda BlanksWilliam S Minor, NP  insulin aspart (NOVOLOG) 100 UNIT/ML injection Inject 0-15 Units into the skin every 4 (four) hours. 07/08/13   Vilinda BlanksWilliam S Minor, NP  levothyroxine (SYNTHROID, LEVOTHROID) 100 MCG SOLR injection Inject 2.5 mLs (50 mcg total) into the vein daily. 07/08/13   Vilinda BlanksWilliam S Minor, NP  LORazepam (ATIVAN) 2 MG/ML injection Inject 0.25 mLs (0.5 mg total) into the vein every 4 (four) hours. 07/08/13   Vilinda BlanksWilliam S Minor, NP  piperacillin-tazobactam (ZOSYN) 3.375 GM/50ML IVPB Inject 50 mLs (3.375 g total) into the vein every 8 (eight) hours. 07/08/13   Vilinda BlanksWilliam S Minor, NP  sodium chloride 0.9 % infusion Inject 10 mLs into the vein continuous. 07/08/13   Vilinda BlanksWilliam S Minor, NP   Allergies  Allergen Reactions  . Beta Adrenergic Blockers Other (See Comments)    Not working well for pt.  Sherrie Mustache. Bystolic [Nebivolol Hcl] Shortness Of Breath  . Carvedilol Shortness Of Breath    Chest  Discomfort.  . Dapsone Other (See Comments)    Kidney  failure  . Diclofenac Sodium Other (See Comments)    Causes  Fluid  Retention.  Consuelo Pandy. Livalo [Pitavastatin Calcium] Shortness Of Breath  . Methotrexate Shortness Of Breath    Mouth sores.  . Metoprolol Succinate Hives  . Oxaprozin Diarrhea  . Other     Fragrance mixtures -  Cinol botanicals  &  Plants oil. Cocamidoproy   -   Betaine. Lipstick & other makeup & cleaning materials.   Marland Kitchen. Epinephrine     FAMILY HISTORY:  Family History  Problem Relation Age of Onset  . Diabetes Maternal Aunt   . Breast cancer Maternal Aunt   . Stroke Mother   . Stroke Father   . Hypothyroidism Sister   . Hypothyroidism Son   . Hypothyroidism Mother   . Hypertension Father     . Kidney disease Father     renal problems   SOCIAL HISTORY:  reports that she has never smoked. She has never used smokeless tobacco. She reports that she does not drink alcohol or use illicit drugs.  REVIEW OF SYSTEMS:   Unable   SUBJECTIVE:  Still very weak  VITAL SIGNS:  reviewed   PHYSICAL EXAMINATION: PHYSICAL EXAMINATION:  General: chronically ill appearing  Neuro: No focal deficits  HEENT: WNL  Cardiovascular: Regular, paced, no M  Lungs: Scattered coarse bilaterally  Abdomen: Soft, dressing intact  Ext: No edema, warm    Recent Labs Lab 07/07/13 0500 07/08/13 0455 07/11/13 [id number]  NA 139 141 139  K 3.6* 4.0 4.2  CL 100 104 104  CO2 25 27 23   BUN 29* 32* 18  CREATININE 0.80 0.88 0.71  GLUCOSE 165* 157* 177*    Recent Labs Lab 07/09/13 0500 07/11/13 0659  HGB 7.5* 8.9*  HCT 24.5* 30.1*  WBC 19.2* 20.6*  PLT 278 415*   No results found.  ASSESSMENT / PLAN: Abd wall Cellulitis w/ resultant sepsis d/t Malpositioned PEG  S/p debridement abd wall infection 5/01, 5/05  Protein-cal malnutrition  P:  SUP: famotidine in TPN  Cont TPN  G-Tube to gravity drainage per [facility]  Re-trial tube feeds end of week BID wet to dry dressing Cont abx 10d course , polymicorbial  Acute on chronic respiratory failure  Tracheostomy status   Vent settings reviewed and adjusted as needed cont [facility] protocol   Cont PSV > with slow progress towards ATC. May need to consider up-sizing to 6 if mucous plugging cont to  be an issue  Would attempt OS 10 today then to 5 if able, in days then consideration to [facility] pcxr c/w residual ARDS Lasix when able to even balance over next 48 hrs  CAF, rate controlled  Complete heart block s/p pacer - per family, pacemaker battery is to be replaced soon  S/P cardiac arrest 2-05/2013 due to mucus plugging   Monitor rhythm and BP   Will need to discuss with Cardiology re PM battery replacement   Mild anemia without acute blood loss   DVT px:  LMWH   DM II  Hypothyroidism   Cont IV levothyroxine   Cont SSI   Recent anoxic brain injury  Post op pain  Chronic clonazepam use  ICU associated agitation   Low dose scheduled lorazepam   Cont fentanyl patch   Cont PRN lorazepam, fent  07/11/2013, 10:42 AM  I have fully examined this patient and agree with above findings.      Mcarthur Rossetti. Tyson Alias, MD, FACP Pgr: [phone] [facility]

## 2013-07-12 LAB — CBC
HCT: 25.8 % — ABNORMAL LOW (ref 36.0–46.0)
HEMOGLOBIN: 7.8 g/dL — AB (ref 12.0–15.0)
MCH: 28.8 pg (ref 26.0–34.0)
MCHC: 30.2 g/dL (ref 30.0–36.0)
MCV: 95.2 fL (ref 78.0–100.0)
Platelets: 455 10*3/uL — ABNORMAL HIGH (ref 150–400)
RBC: 2.71 MIL/uL — ABNORMAL LOW (ref 3.87–5.11)
RDW: 16.8 % — ABNORMAL HIGH (ref 11.5–15.5)
WBC: 21.5 10*3/uL — ABNORMAL HIGH (ref 4.0–10.5)

## 2013-07-12 NOTE — Progress Notes (Signed)
[facility]                                                                                              Progress note     Patient Demographics  [name] Cardella, is a 80 y.o. female  ZOX:[id number]  WJX:[id number]  DOB - 07/11/1932  Admit date - 07/08/2013  Admitting Physician Elnora MorrisonAhmad B Barakat, MD  Outpatient Primary MD for the patient is Astrid DivineGRIFFIN,ELAINE COLLINS, MD  LOS - 4  Chief complaint Respiratory failure Abdominal wound      Subjective:   [name] [place] obtunded cannot give any complaints  Objective:   Vital signs  Temperature 97.7 Heart rate 74 Respiratory rate 20 Blood pressure 130/70 Pulse ox 100%    Exam Obtunded [place].AT,PERRAL Supple Neck,No JVD, No cervical lymphadenopathy appriciated.  Trach in place Symmetrical Chest wall movement, diminished bilaterally with scattered rhonchi RRR,No Gallops,Rubs or new Murmurs, No Parasternal Heave Decreased B.Sounds, abdominal binder in place. PEG tube noted No Cyanosis, Clubbing or edema, No new Rash or bruise     I&Os [id number] Peg Tube Foley - yes Trach #4  Data Review   CBC  Recent Labs Lab 07/09/13 0500 07/11/13 0659 07/12/13 0630  WBC 19.2* 20.6* 21.5*  HGB 7.5* 8.9* 7.8*  HCT 24.5* 30.1* 25.8*  PLT 278 415* 455*  MCV 94.2 95.0 95.2  MCH 28.8 28.1 28.8  MCHC 30.6 29.6* 30.2  RDW 17.1* 16.7* 16.8*  LYMPHSABS  --  3.1  --   MONOABS  --  2.3*  --   EOSABS  --  0.3  --   BASOSABS  --  0.1  --     Chemistries   Recent Labs Lab 07/06/13 0500 07/07/13 0500 07/08/13 0455 07/11/13 0659  NA 139 139 141 139  K 3.8 3.6* 4.0 4.2  CL 101 100 104 104  CO2 27 25 27 23   GLUCOSE 139* 165* 157* 177*  BUN 24* 29* 32* 18  CREATININE 0.81 0.80 0.88 0.71  CALCIUM 8.3* 8.4 8.2* 9.1  MG  --  1.9  --   --   AST  --  16  --   --   ALT  --  9  --   --   ALKPHOS  --  89  --   --   BILITOT  --  0.3  --   --     ------------------------------------------------------------------------------------------------------------------ CrCl is unknown because both a height and weight (above a minimum accepted value) are required for this calculation. ------------------------------------------------------------------------------------------------------------------ No results found for this basename: HGBA1C,  in the last 72 hours ------------------------------------------------------------------------------------------------------------------ No results found for this basename: CHOL, HDL, LDLCALC, TRIG, CHOLHDL, LDLDIRECT,  in the last 72 hours ------------------------------------------------------------------------------------------------------------------ No results found for this basename: TSH, T4TOTAL, FREET3, T3FREE, THYROIDAB,  in the last 72 hours ------------------------------------------------------------------------------------------------------------------ No results found for this basename: VITAMINB12, FOLATE, FERRITIN, TIBC, IRON, RETICCTPCT,  in the last 72 hours  Coagulation profile No results found for this basename: INR, PROTIME,  in the last 168 hours  No results found for this basename: DDIMER,  in the last 72 hours  Cardiac Enzymes  No results found for this basename: CK, CKMB, TROPONINI, MYOGLOBIN,  in the last 168 hours ------------------------------------------------------------------------------------------------------------------ No components found with this basename: POCBNP,   Micro Results No results found for this or any previous visit (from the past 240 hour(s)).     Assessment & Plan   VDRF ; continue with PSV trials for 20 hours today. Failed 5/5. Sepsis continue with IV antibiotics Abdomen wound; abdominal wound care to follow Chronic atrial food status post pacemaker, rate controlled Acute on chronic renal failure; monitor creatinine Klebsiella UTI; treated Protein  calorie malnutrition continue with TPN for now will challenge to feeding by the end of the week Anemia Diabetes mellitus type 2 on Levemir and NovoLog Hypothyroidism continue with Synthroid Anoxic encephalopathy; monitor History of retroperitoneal hematoma with no signs of acute bleed History of multidrug resistant Pseudomonas pneumonia treated with tobramycin in the past.  Plan Start Flagyl IV Check labs in a.m..  code Status: Full  Family Communication: At bedside   DVT Prophylaxis  Lovenox    Carron Curie M.D on 07/12/2013 at 11:31 AM

## 2013-07-13 NOTE — Progress Notes (Signed)
[facility]                                                                                              Progress note     Patient Demographics  [name], is a 80 y.o. female  RUE:[id number]  JYN:[id number]  DOB - 12/31/1932  Admit date - 07/08/2013  Admitting Physician Elnora MorrisonAhmad B Barakat, MD  Outpatient Primary MD for the patient is Astrid DivineGRIFFIN,ELAINE COLLINS, MD  LOS - 5  Chief complaint Respiratory failure Abdominal wound      Subjective:   [name] obtunded cannot give any complaints  Objective:   Vital signs  Temperature 98.7 Heart rate 93 Respiratory rate 26 Blood pressure 156/66 Pulse ox 100%    Exam Obtunded [place].AT,PERRAL Supple Neck,No JVD, No cervical lymphadenopathy appriciated.  Trach in place Symmetrical Chest wall movement, diminished bilaterally with scattered rhonchi RRR,No Gallops,Rubs or new Murmurs, No Parasternal Heave Decreased B.Sounds, abdominal binder in place. PEG tube noted No Cyanosis, Clubbing or edema, No new Rash or bruise     I&Os 2120/960 Peg Tube Foley - yes Trach #4  Data Review   CBC  Recent Labs Lab 07/09/13 0500 07/11/13 0659 07/12/13 0630  WBC 19.2* 20.6* 21.5*  HGB 7.5* 8.9* 7.8*  HCT 24.5* 30.1* 25.8*  PLT 278 415* 455*  MCV 94.2 95.0 95.2  MCH 28.8 28.1 28.8  MCHC 30.6 29.6* 30.2  RDW 17.1* 16.7* 16.8*  LYMPHSABS  --  3.1  --   MONOABS  --  2.3*  --   EOSABS  --  0.3  --   BASOSABS  --  0.1  --     Chemistries   Recent Labs Lab 07/07/13 0500 07/08/13 0455 07/11/13 0659  NA 139 141 139  K 3.6* 4.0 4.2  CL 100 104 104  CO2 25 27 23   GLUCOSE 165* 157* 177*  BUN 29* 32* 18  CREATININE 0.80 0.88 0.71  CALCIUM 8.4 8.2* 9.1  MG 1.9  --   --   AST 16  --   --   ALT 9  --   --   ALKPHOS 89  --   --   BILITOT 0.3  --   --     ------------------------------------------------------------------------------------------------------------------ CrCl is unknown because both a height and weight (above a minimum accepted value) are required for this calculation. ------------------------------------------------------------------------------------------------------------------ No results found for this basename: HGBA1C,  in the last 72 hours ------------------------------------------------------------------------------------------------------------------ No results found for this basename: CHOL, HDL, LDLCALC, TRIG, CHOLHDL, LDLDIRECT,  in the last 72 hours ------------------------------------------------------------------------------------------------------------------ No results found for this basename: TSH, T4TOTAL, FREET3, T3FREE, THYROIDAB,  in the last 72 hours ------------------------------------------------------------------------------------------------------------------ No results found for this basename: VITAMINB12, FOLATE, FERRITIN, TIBC, IRON, RETICCTPCT,  in the last 72 hours  Coagulation profile No results found for this basename: INR, PROTIME,  in the last 168 hours  No results found for this basename: DDIMER,  in the last 72 hours  Cardiac Enzymes No results found for this basename: CK, CKMB, TROPONINI, MYOGLOBIN,  in the last 168 hours ------------------------------------------------------------------------------------------------------------------ No components found with this basename: POCBNP,  Micro Results No results found for this or any previous visit (from the past 240 hour(s)).     Assessment & Plan   VDRF ; continue with ATC trials. Sepsis continue with IV antibiotics Abdominal wound; abdominal wound care to follow Chronic atrial food status post pacemaker, rate controlled Acute on chronic renal failure; monitor creatinine Klebsiella UTI; treated Protein calorie malnutrition continue  with TPN for now will challenge to feeding by the end of the week Anemia Diabetes mellitus type 2 on Levemir and NovoLog Hypothyroidism continue with Synthroid Anoxic encephalopathy; monitor History of retroperitoneal hematoma with no signs of acute bleed History of multidrug resistant Pseudomonas pneumonia treated with tobramycin in the past. Leukocytosis-monitor  Plan Check labs in a.m.  code Status: Full  Family Communication: At bedside   DVT Prophylaxis  Lovenox    Carron Curie M.D on 07/13/2013 at 2:06 PM

## 2013-07-14 ENCOUNTER — Other Ambulatory Visit (HOSPITAL_COMMUNITY): Payer: Self-pay

## 2013-07-14 NOTE — Consult Note (Signed)
   Name: [name] MRN: [id number] DOB: Sep 16, 1932    ADMISSION DATE:  07/08/2013 CONSULTATION DATE:  5/11  REFERRING MD :  Sharyon Medicus  PRIMARY SERVICE:  ssh   CHIEF COMPLAINT:  Weaning   BRIEF PATIENT DESCRIPTION:  80 yo chronically ill woman with complicated hospitalization 2-05/2013 to [facility], [facility], [facility] and then back to [facility] before discharge to SNF. She is s/p Trach and PEG in aftermath of gastroenteritis + ARDS, retroperitoneal bleed, pneumothorax; then PNA (with MDR pseudomonas), septic and hemorrhagic shock. She was weaned to ATC at [facility] and was discharged to SNF for further care. She was admitted with fever, abdominal pain and erythema of her abdominal wall 5/1. Was found to have subcutaneous tissue infection due to malpositioned PEG requiring significant debridement 5/1. She returned to ICU on MV, hemodynamically stable. Went to OR X 2 for irrigation and washout. Placed on TPN. Transferred back to [facility] for weaning efforts   LINES / TUBES:  R femoral CVL 5/01 >> 5/04  Trach 2/18 >>  G tube 04/2013 >>  RUE PICC 5/04 >>   CULTURES:  MRSA PCR 5/01 >> NEG  Resp 5/01 >> NOF  Urine 5/01 >> Klebsiella (sens to Zosyn)  Blood 5/01 >> NEG   ANTIBIOTICS:  Vancomycin 5/1 >> 5/04  Pip/tazo 5/1 >> (10 days planned)   SUBJECTIVE:  Did tc  VITAL SIGNS:  reviewed   PHYSICAL EXAMINATION: PHYSICAL EXAMINATION:  General: chronically ill appearing  Neuro: No focal deficits, does not follow commands HEENT: WNL  Cardiovascular: Regular, paced, no M  Lungs: cta Abdomen: Soft, dressing intact  Ext: No edema, warm    Recent Labs Lab 07/08/13 0455 07/11/13 0659  NA 141 139  K 4.0 4.2  CL 104 104  CO2 27 23  BUN 32* 18  CREATININE 0.88 0.71  GLUCOSE 157* 177*    Recent Labs Lab 07/09/13 0500 07/11/13 0659 07/12/13 0630  HGB 7.5* 8.9* 7.8*  HCT 24.5* 30.1* 25.8*  WBC 19.2* 20.6* 21.5*  PLT 278 415* 455*   No results found.  ASSESSMENT / PLAN: Abd wall Cellulitis w/  resultant sepsis d/t Malpositioned PEG  S/p debridement abd wall infection 5/01, 5/05  Protein-cal malnutrition  P:  SUP: famotidine in TPN  Cont TPN  For trickle BID wet to dry dressing Cont abx 10d course , polymicorbial  Acute on chronic respiratory failure  Tracheostomy status  Trach collar 8 hrs goal Would NOT escalate off [facility] protocol Failed with high rate 5/13, repeat pcxr, had sig infiltrates remaining Can we add lasix to neg balance   07/14/2013, 12:39 PM  I have fully examined this patient and agree with above findings.      Mcarthur Rossetti. Tyson Alias, MD, FACP Pgr: [phone] [facility]

## 2013-07-14 NOTE — Progress Notes (Signed)
[facility]                                                                                              Progress note     Patient Demographics  [name], is a 80 y.o. female  ZOX:[id number]  WJX:[id number]  DOB - 04/11/1932  Admit date - 07/08/2013  Admitting Physician Elnora MorrisonAhmad B Barakat, MD  Outpatient Primary MD for the patient is Astrid DivineGRIFFIN,ELAINE COLLINS, MD  LOS - 6  Chief complaint Respiratory failure Abdominal wound      Subjective:   Angela NorrisShirley Fitzgerald obtunded cannot give any complaints  Objective:   Vital signs  Temperature 97.9 Heart rate 97 Respiratory rate 26 Blood pressure 119/77 Pulse ox 100%    Exam Obtunded [place].AT,PERRAL Supple Neck,No JVD, No cervical lymphadenopathy appriciated.  Trach in place Symmetrical Chest wall movement, diminished bilaterally with scattered rhonchi RRR,No Gallops,Rubs or new Murmurs, No Parasternal Heave Decreased B.Sounds, abdominal binder in place. PEG tube noted No Cyanosis, Clubbing or edema, No new Rash or bruise     I&Os unknown Peg Tube Foley - yes Trach #4  Data Review   CBC  Recent Labs Lab 07/09/13 0500 07/11/13 0659 07/12/13 0630  WBC 19.2* 20.6* 21.5*  HGB 7.5* 8.9* 7.8*  HCT 24.5* 30.1* 25.8*  PLT 278 415* 455*  MCV 94.2 95.0 95.2  MCH 28.8 28.1 28.8  MCHC 30.6 29.6* 30.2  RDW 17.1* 16.7* 16.8*  LYMPHSABS  --  3.1  --   MONOABS  --  2.3*  --   EOSABS  --  0.3  --   BASOSABS  --  0.1  --     Chemistries   Recent Labs Lab 07/08/13 0455 07/11/13 0659  NA 141 139  K 4.0 4.2  CL 104 104  CO2 27 23  GLUCOSE 157* 177*  BUN 32* 18  CREATININE 0.88 0.71  CALCIUM 8.2* 9.1   ------------------------------------------------------------------------------------------------------------------ CrCl is unknown because both a height and weight (above a minimum accepted value) are required for this  calculation. ------------------------------------------------------------------------------------------------------------------ No results found for this basename: HGBA1C,  in the last 72 hours ------------------------------------------------------------------------------------------------------------------ No results found for this basename: CHOL, HDL, LDLCALC, TRIG, CHOLHDL, LDLDIRECT,  in the last 72 hours ------------------------------------------------------------------------------------------------------------------ No results found for this basename: TSH, T4TOTAL, FREET3, T3FREE, THYROIDAB,  in the last 72 hours ------------------------------------------------------------------------------------------------------------------ No results found for this basename: VITAMINB12, FOLATE, FERRITIN, TIBC, IRON, RETICCTPCT,  in the last 72 hours  Coagulation profile No results found for this basename: INR, PROTIME,  in the last 168 hours  No results found for this basename: DDIMER,  in the last 72 hours  Cardiac Enzymes No results found for this basename: CK, CKMB, TROPONINI, MYOGLOBIN,  in the last 168 hours ------------------------------------------------------------------------------------------------------------------ No components found with this basename: POCBNP,   Micro Results No results found for this or any previous visit (from the past 240 hour(s)).     Assessment & Plan   VDRF ; continue with ATC trials. Failed 8 hours yesterday Sepsis continue with IV antibiotics Abdominal wound; abdominal wound care to follow Chronic atrial food status post pacemaker, rate  controlled Acute on chronic renal failure; monitor creatinine Klebsiella UTI; treated Protein calorie malnutrition continue with TPN for now will challenge to feeding by the end of the week Anemia Diabetes mellitus type 2 on Levemir and NovoLog Hypothyroidism continue with Synthroid Anoxic encephalopathy;  monitor History of retroperitoneal hematoma with no signs of acute bleed History of multidrug resistant Pseudomonas pneumonia treated with tobramycin in the past. Leukocytosis-monitor  Plan Check labs in a.m. Lasix 40 mg IV daily Scopolamine doxycycline 100 mg twice a day  code Status: Full  Family Communication: At bedside   DVT Prophylaxis  Lovenox    Carron Curie M.D on 07/14/2013 at 3:25 PM

## 2013-07-15 ENCOUNTER — Other Ambulatory Visit (HOSPITAL_COMMUNITY): Payer: Self-pay

## 2013-07-15 LAB — CBC
HCT: 26.7 % — ABNORMAL LOW (ref 36.0–46.0)
Hemoglobin: 8 g/dL — ABNORMAL LOW (ref 12.0–15.0)
MCH: 28 pg (ref 26.0–34.0)
MCHC: 30 g/dL (ref 30.0–36.0)
MCV: 93.4 fL (ref 78.0–100.0)
PLATELETS: 608 10*3/uL — AB (ref 150–400)
RBC: 2.86 MIL/uL — ABNORMAL LOW (ref 3.87–5.11)
RDW: 16.3 % — AB (ref 11.5–15.5)
WBC: 17.4 10*3/uL — AB (ref 4.0–10.5)

## 2013-07-15 LAB — BASIC METABOLIC PANEL
BUN: 31 mg/dL — ABNORMAL HIGH (ref 6–23)
CHLORIDE: 102 meq/L (ref 96–112)
CO2: 27 mEq/L (ref 19–32)
Calcium: 10.1 mg/dL (ref 8.4–10.5)
Creatinine, Ser: 0.8 mg/dL (ref 0.50–1.10)
GFR calc non Af Amer: 68 mL/min — ABNORMAL LOW (ref >90)
GFR, EST AFRICAN AMERICAN: 79 mL/min — AB (ref >90)
Glucose, Bld: 180 mg/dL — ABNORMAL HIGH (ref 70–99)
Potassium: 4.6 mEq/L (ref 3.7–5.3)
SODIUM: 141 meq/L (ref 137–147)

## 2013-07-15 NOTE — Progress Notes (Signed)
[facility]                                                                                              Progress note     Patient Demographics  [name], is a 80 y.o. female  PJK:[id number]  [id number]:[id number]  DOB - [birth year]  Admit date - 07/08/2013  Admitting Physician Elnora Morrison, MD  Outpatient Primary MD for the patient is [name], MD  LOS - 7  Chief complaint Respiratory failure Abdominal wound      Subjective:   [name] obtunded cannot give any complaints  Objective:   Vital signs  Temperature 99.9 Heart rate 78 Respiratory rate 18 Blood pressure 119/77 Pulse ox 100%    Exam Obtunded [name].AT,PERRAL Supple Neck,No JVD, No cervical lymphadenopathy appriciated.  Trach in place Symmetrical Chest wall movement, diminished bilaterally with scattered rhonchi RRR,No Gallops,Rubs or new Murmurs, No Parasternal Heave Decreased B.Sounds, abdominal binder in place. PEG tube noted No Cyanosis, Clubbing or edema, No new Rash or bruise     I&Os 2390/2950 Peg Tube Foley - yes Trach #4  Data Review   CBC  Recent Labs Lab 07/09/13 0500 07/11/13 0659 07/12/13 0630 07/15/13 0649  WBC 19.2* 20.6* 21.5* 17.4*  HGB 7.5* 8.9* 7.8* 8.0*  HCT 24.5* 30.1* 25.8* 26.7*  PLT 278 415* 455* 608*  MCV 94.2 95.0 95.2 93.4  MCH 28.8 28.1 28.8 28.0  MCHC 30.6 29.6* 30.2 30.0  RDW 17.1* 16.7* 16.8* 16.3*  LYMPHSABS  --  3.1  --   --   MONOABS  --  2.3*  --   --   EOSABS  --  0.3  --   --   BASOSABS  --  0.1  --   --     Chemistries   Recent Labs Lab 07/11/13 0659 07/15/13 0649  NA 139 141  K 4.2 4.6  CL 104 102  CO2 23 27  GLUCOSE 177* 180*  BUN 18 31*  CREATININE 0.71 0.80  CALCIUM 9.1 10.1   ------------------------------------------------------------------------------------------------------------------ CrCl is unknown because both a  height and weight (above a minimum accepted value) are required for this calculation. ------------------------------------------------------------------------------------------------------------------ No results found for this basename: HGBA1C,  in the last 72 hours ------------------------------------------------------------------------------------------------------------------ No results found for this basename: CHOL, HDL, LDLCALC, TRIG, CHOLHDL, LDLDIRECT,  in the last 72 hours ------------------------------------------------------------------------------------------------------------------ No results found for this basename: TSH, T4TOTAL, FREET3, T3FREE, THYROIDAB,  in the last 72 hours ------------------------------------------------------------------------------------------------------------------ No results found for this basename: VITAMINB12, FOLATE, FERRITIN, TIBC, IRON, RETICCTPCT,  in the last 72 hours  Coagulation profile No results found for this basename: INR, PROTIME,  in the last 168 hours  No results found for this basename: DDIMER,  in the last 72 hours  Cardiac Enzymes No results found for this basename: CK, CKMB, TROPONINI, MYOGLOBIN,  in the last 168 hours ------------------------------------------------------------------------------------------------------------------ No components found with this basename: POCBNP,   Micro Results No results found for this or any previous visit (from the past 240 hour(s)).     Assessment & Plan   VDRF ; continue with ATC trials. Growth  is 12 hours ATC today Sepsis continue with IV antibiotics Abdominal wound; abdominal wound care to follow Chronic atrial food status post pacemaker, rate controlled Acute on chronic renal failure; monitor creatinine Klebsiella UTI; treated Protein calorie malnutrition continue with TPN for now will challenge to feeding by the end of the week Anemia Diabetes mellitus type 2 on Levemir and  NovoLog Hypothyroidism continue with Synthroid Anoxic encephalopathy; monitor History of retroperitoneal hematoma with no signs of acute bleed History of multidrug resistant Pseudomonas pneumonia treated with tobramycin in the past. Leukocytosis-monitor  Plan Continue same treatment  code Status: Full     DVT Prophylaxis  Lovenox    Carron CurieAli Jerlene Rockers M.D on 07/15/2013 at 1:59 PM

## 2013-07-16 NOTE — Progress Notes (Signed)
[facility]                                                                                              Progress note     Patient Demographics  [name], is a 80 y.o. female  BWI:[id number]  [id number]  DOB - 03-16-1932  Admit date - 07/08/2013  Admitting Physician Elnora Morrison, MD  Outpatient Primary MD for the patient is [name], MD  LOS - 8  Chief complaint Respiratory failure Abdominal wound      Subjective:   [name] obtunded cannot give any complaints  Objective:   Vital signs  Temperature 98.7 Heart rate 96 Respiratory rate 92 Blood pressure 164/76 Pulse ox 100%    Exam Obtunded [place].AT,PERRAL Supple Neck,No JVD, No cervical lymphadenopathy appriciated.  Trach in place Symmetrical Chest wall movement, diminished bilaterally with scattered rhonchi RRR,No Gallops,Rubs or new Murmurs, No Parasternal Heave Decreased B.Sounds, abdominal binder in place. PEG tube noted No Cyanosis, Clubbing or edema, No new Rash or bruise     I&Os 2730/3700 Peg Tube Foley - yes Trach #4  Data Review   CBC  Recent Labs Lab 07/11/13 0659 07/12/13 0630 07/15/13 0649  WBC 20.6* 21.5* 17.4*  HGB 8.9* 7.8* 8.0*  HCT 30.1* 25.8* 26.7*  PLT 415* 455* 608*  MCV 95.0 95.2 93.4  MCH 28.1 28.8 28.0  MCHC 29.6* 30.2 30.0  RDW 16.7* 16.8* 16.3*  LYMPHSABS 3.1  --   --   MONOABS 2.3*  --   --   EOSABS 0.3  --   --   BASOSABS 0.1  --   --     Chemistries   Recent Labs Lab 07/11/13 0659 07/15/13 0649  NA 139 141  K 4.2 4.6  CL 104 102  CO2 23 27  GLUCOSE 177* 180*  BUN 18 31*  CREATININE 0.71 0.80  CALCIUM 9.1 10.1   ------------------------------------------------------------------------------------------------------------------ CrCl is unknown because both a height and weight (above a minimum accepted value) are required for this  calculation. ------------------------------------------------------------------------------------------------------------------ No results found for this basename: HGBA1C,  in the last 72 hours ------------------------------------------------------------------------------------------------------------------ No results found for this basename: CHOL, HDL, LDLCALC, TRIG, CHOLHDL, LDLDIRECT,  in the last 72 hours ------------------------------------------------------------------------------------------------------------------ No results found for this basename: TSH, T4TOTAL, FREET3, T3FREE, THYROIDAB,  in the last 72 hours ------------------------------------------------------------------------------------------------------------------ No results found for this basename: VITAMINB12, FOLATE, FERRITIN, TIBC, IRON, RETICCTPCT,  in the last 72 hours  Coagulation profile No results found for this basename: INR, PROTIME,  in the last 168 hours  No results found for this basename: DDIMER,  in the last 72 hours  Cardiac Enzymes No results found for this basename: CK, CKMB, TROPONINI, MYOGLOBIN,  in the last 168 hours ------------------------------------------------------------------------------------------------------------------ No components found with this basename: POCBNP,   Micro Results No results found for this or any previous visit (from the past 240 hour(s)).     Assessment & Plan   VDRF ; continue with ATC trials.  Septic picture, peritonitis continue with IV antibiotics Abdominal wound; abdominal wound care to follow Chronic atrial food status post pacemaker, rate controlled  Acute on chronic renal failure; monitor creatinine Klebsiella UTI; treated Protein calorie malnutrition continue with TPN for now will challenge to feeding by the end of the week Anemia Diabetes mellitus type 2 on Levemir and NovoLog Hypothyroidism continue with Synthroid Anoxic encephalopathy;  monitor History of retroperitoneal hematoma with no signs of acute bleed History of multidrug resistant Pseudomonas pneumonia treated with tobramycin in the past. Leukocytosis-monitor  Plan  Continue same treatment  code Status: Full     DVT Prophylaxis  Lovenox    Carron CurieAli Rache Klimaszewski M.D on 07/16/2013 at 12:15 PM

## 2013-07-17 NOTE — Progress Notes (Signed)
[facility]                                                                                              Progress note     Patient Demographics  [name], is a 80 y.o. female  UYQ:[id number]  [id number]  DOB - 12/17/1932  Admit date - 07/08/2013  Admitting Physician Elnora MorrisonAhmad B Barakat, MD  Outpatient Primary MD for the patient is Astrid DivineGRIFFIN,ELAINE COLLINS, MD  LOS - 9  Chief complaint Respiratory failure Abdominal wound      Subjective:   [name] obtunded cannot give any complaints  Objective:   Vital signs  Temperature 98.9 Heart rate 93 Respiratory rate 20 Blood pressure 140/61 Pulse ox 99 %    Exam Obtunded [place].AT,PERRAL Supple Neck,No JVD, No cervical lymphadenopathy appriciated.  Trach in place Symmetrical Chest wall movement, diminished bilaterally with scattered rhonchi RRR,No Gallops,Rubs or new Murmurs, No Parasternal Heave Decreased B.Sounds, abdominal binder in place. PEG tube noted No Cyanosis, Clubbing or edema, No new Rash or bruise     I&Os 2580/2050 Peg Tube Foley - yes Trach #4  Data Review   CBC  Recent Labs Lab 07/11/13 0659 07/12/13 0630 07/15/13 0649  WBC 20.6* 21.5* 17.4*  HGB 8.9* 7.8* 8.0*  HCT 30.1* 25.8* 26.7*  PLT 415* 455* 608*  MCV 95.0 95.2 93.4  MCH 28.1 28.8 28.0  MCHC 29.6* 30.2 30.0  RDW 16.7* 16.8* 16.3*  LYMPHSABS 3.1  --   --   MONOABS 2.3*  --   --   EOSABS 0.3  --   --   BASOSABS 0.1  --   --     Chemistries   Recent Labs Lab 07/11/13 0659 07/15/13 0649  NA 139 141  K 4.2 4.6  CL 104 102  CO2 23 27  GLUCOSE 177* 180*  BUN 18 31*  CREATININE 0.71 0.80  CALCIUM 9.1 10.1   ------------------------------------------------------------------------------------------------------------------ CrCl is unknown because both a height and weight (above a minimum accepted value) are required for this  calculation. ------------------------------------------------------------------------------------------------------------------ No results found for this basename: HGBA1C,  in the last 72 hours ------------------------------------------------------------------------------------------------------------------ No results found for this basename: CHOL, HDL, LDLCALC, TRIG, CHOLHDL, LDLDIRECT,  in the last 72 hours ------------------------------------------------------------------------------------------------------------------ No results found for this basename: TSH, T4TOTAL, FREET3, T3FREE, THYROIDAB,  in the last 72 hours ------------------------------------------------------------------------------------------------------------------ No results found for this basename: VITAMINB12, FOLATE, FERRITIN, TIBC, IRON, RETICCTPCT,  in the last 72 hours  Coagulation profile No results found for this basename: INR, PROTIME,  in the last 168 hours  No results found for this basename: DDIMER,  in the last 72 hours  Cardiac Enzymes No results found for this basename: CK, CKMB, TROPONINI, MYOGLOBIN,  in the last 168 hours ------------------------------------------------------------------------------------------------------------------ No components found with this basename: POCBNP,   Micro Results No results found for this or any previous visit (from the past 240 hour(s)).     Assessment & Plan   VDRF ; continue with ATC trials.  Septic picture, peritonitis continue with IV antibiotics Abdominal wound; abdominal wound care to follow Chronic atrial food status post pacemaker, rate  controlled Acute on chronic renal failure; monitor creatinine Klebsiella UTI; treated Protein calorie malnutrition continue with TPN for now will challenge to feeding by the end of the week Anemia Diabetes mellitus type 2 on Levemir and NovoLog Hypothyroidism continue with Synthroid Anoxic encephalopathy;  monitor History of retroperitoneal hematoma with no signs of acute bleed History of multidrug resistant Pseudomonas pneumonia treated with tobramycin in the past. Leukocytosis-monitor  Plan  Check labs in a.m.  code Status: Full     DVT Prophylaxis  Lovenox    Carron Curie M.D on 07/17/2013 at 12:11 PM

## 2013-07-18 ENCOUNTER — Other Ambulatory Visit (HOSPITAL_COMMUNITY): Payer: Self-pay

## 2013-07-18 DIAGNOSIS — R404 Transient alteration of awareness: Secondary | ICD-10-CM

## 2013-07-18 DIAGNOSIS — J9620 Acute and chronic respiratory failure, unspecified whether with hypoxia or hypercapnia: Secondary | ICD-10-CM

## 2013-07-18 DIAGNOSIS — Z930 Tracheostomy status: Secondary | ICD-10-CM

## 2013-07-18 LAB — COMPREHENSIVE METABOLIC PANEL
ALK PHOS: 106 U/L (ref 39–117)
ALT: 16 U/L (ref 0–35)
AST: 36 U/L (ref 0–37)
Albumin: 1.7 g/dL — ABNORMAL LOW (ref 3.5–5.2)
BUN: 36 mg/dL — ABNORMAL HIGH (ref 6–23)
CALCIUM: 10.1 mg/dL (ref 8.4–10.5)
CO2: 25 mEq/L (ref 19–32)
Chloride: 100 mEq/L (ref 96–112)
Creatinine, Ser: 0.76 mg/dL (ref 0.50–1.10)
GFR calc non Af Amer: 78 mL/min — ABNORMAL LOW (ref >90)
GLUCOSE: 206 mg/dL — AB (ref 70–99)
POTASSIUM: 4.1 meq/L (ref 3.7–5.3)
SODIUM: 138 meq/L (ref 137–147)
TOTAL PROTEIN: 6.2 g/dL (ref 6.0–8.3)
Total Bilirubin: <0.2 mg/dL — ABNORMAL LOW (ref 0.3–1.2)

## 2013-07-18 LAB — CBC
HCT: 28.3 % — ABNORMAL LOW (ref 36.0–46.0)
HEMOGLOBIN: 8.6 g/dL — AB (ref 12.0–15.0)
MCH: 27.8 pg (ref 26.0–34.0)
MCHC: 30.4 g/dL (ref 30.0–36.0)
MCV: 91.6 fL (ref 78.0–100.0)
PLATELETS: 664 10*3/uL — AB (ref 150–400)
RBC: 3.09 MIL/uL — ABNORMAL LOW (ref 3.87–5.11)
RDW: 16.3 % — ABNORMAL HIGH (ref 11.5–15.5)
WBC: 14.1 10*3/uL — ABNORMAL HIGH (ref 4.0–10.5)

## 2013-07-18 LAB — DIFFERENTIAL
BASOS PCT: 1 % (ref 0–1)
Basophils Absolute: 0.1 10*3/uL (ref 0.0–0.1)
EOS ABS: 0.3 10*3/uL (ref 0.0–0.7)
Eosinophils Relative: 2 % (ref 0–5)
Lymphocytes Relative: 16 % (ref 12–46)
Lymphs Abs: 2.3 10*3/uL (ref 0.7–4.0)
MONOS PCT: 13 % — AB (ref 3–12)
Monocytes Absolute: 1.8 10*3/uL — ABNORMAL HIGH (ref 0.1–1.0)
NEUTROS ABS: 9.7 10*3/uL — AB (ref 1.7–7.7)
NEUTROS PCT: 68 % (ref 43–77)

## 2013-07-18 LAB — TRIGLYCERIDES: Triglycerides: 148 mg/dL (ref <150)

## 2013-07-18 LAB — PHOSPHORUS: PHOSPHORUS: 4.2 mg/dL (ref 2.3–4.6)

## 2013-07-18 LAB — MAGNESIUM: MAGNESIUM: 2 mg/dL (ref 1.5–2.5)

## 2013-07-18 NOTE — Consult Note (Signed)
PULMONARY / CRITICAL CARE MEDICINE  Name: [name] MRN: [id number] DOB: Aug 01, 1932    ADMISSION DATE:  07/08/2013 CONSULTATION DATE:  07/11/2013  REFERRING MD :  Sharyon Medicus   CHIEF COMPLAINT:  Weaning   BRIEF PATIENT DESCRIPTION: 80 yo chronically ill woman with complicated hospitalization to [facility], [facility], [facility] and then back to [facility] before discharge to SNF. She is s/p trach and PEG in aftermath of gastroenteritis + ARDS, retroperitoneal bleed, pneumothorax; then PNA (with MDR Pseudomonas), septic and hemorrhagic shock. She was weaned to [facility] at [facility] and was discharged to SNF for further care. She was admitted with fever, abdominal pain and erythema of her abdominal wall 5/1. Was found to have subcutaneous tissue infection due to malpositioned PEG requiring significant debridement 5/1. Transferred back to [facility] for weaning efforts   VITAL SIGNS:  Reviewed  PHYSICAL EXAMINATION:  General: No distress Neuro: No focal deficits, confused, agitated, does not follow commands HEENT: Tracheostomy site intact Cardiovascular: Regular, no murmurs Lungs: Bilateral diminished air entry, few rhonchi Abdomen: Soft, bowel sounds present Ext: No edema, warm  LABS: CBC  Recent Labs Lab 07/12/13 0630 07/15/13 0649 07/18/13 0824  WBC 21.5* 17.4* 14.1*  HGB 7.8* 8.0* 8.6*  HCT 25.8* 26.7* 28.3*  PLT 455* 608* 664*   Coag's No results found for this basename: APTT, INR,  in the last 168 hours BMET  Recent Labs Lab 07/15/13 0649 07/18/13 0824  NA 141 138  K 4.6 4.1  CL 102 100  CO2 27 25  BUN 31* 36*  CREATININE 0.80 0.76  GLUCOSE 180* 206*   Electrolytes  Recent Labs Lab 07/15/13 0649 07/18/13 0824  CALCIUM 10.1 10.1  MG  --  2.0  PHOS  --  4.2   Sepsis Markers No results found for this basename: LATICACIDVEN, PROCALCITON, O2SATVEN,  in the last 168 hours ABG No results found for this basename: PHART, PCO2ART, PO2ART,  in the last 168 hours Liver Enzymes  Recent Labs Lab  07/18/13 0824  AST 36  ALT 16  ALKPHOS 106  BILITOT <0.2*  ALBUMIN 1.7*   Cardiac Enzymes No results found for this basename: TROPONINI, PROBNP,  in the last 168 hours Glucose No results found for this basename: GLUCAP,  in the last 168 hours  IMAGING: Dg Chest Port 1 View  07/18/2013   CLINICAL DATA:  Respiratory failure  EXAM: PORTABLE CHEST - 1 VIEW  COMPARISON:  07/15/2013  FINDINGS: There is a tracheostomy tube with tip above the carina. Right arm PICC line tip is in the right atrium. There is a left chest wall ICD with leads in the right atrial appendage, right ventricle and coronary sinus. Normal heart size. Small pleural effusions and mild interstitial edema is unchanged from previous exam.  IMPRESSION: 1. Stable support apparatus. 2. No change in pulmonary edema pattern.   Electronically Signed   By: Signa Kell M.D.   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  Lonia Farber, MD Pulmonary and Critical Care Medicine [facility] Pager: [phone]  07/18/2013, 3:52 PM

## 2013-07-18 NOTE — Progress Notes (Signed)
[facility]                                                                                              Progress note     Patient Demographics  [name], is a 80 y.o. female  UEA:[id number]  YNW:[id number]  DOB - 08/23/1932  Admit date - 07/08/2013  Admitting Physician Angela MorrisonAhmad B Barakat, MD  Outpatient Primary MD for the patient is Angela DivineGRIFFIN,Angela COLLINS, MD  LOS - 10  Chief complaint Respiratory failure Abdominal wound      Subjective:   [name] obtunded cannot give any complaints  Objective:   Vital signs  Temperature 97.2 Heart rate 81 Respiratory rate 20 Blood pressure 14/72 Pulse ox 98 %    Exam Obtunded [place].AT,PERRAL Supple Neck,No JVD, No cervical lymphadenopathy appriciated.  Trach in place Symmetrical Chest wall movement, diminished bilaterally with scattered rhonchi RRR,No Gallops,Rubs or new Murmurs, No Parasternal Heave Decreased B.Sounds, abdominal binder in place. PEG tube noted No Cyanosis, Clubbing or edema, No new Rash or bruise     I&Os 2660/2640 Peg Tube Foley - yes Trach #4  Data Review   CBC  Recent Labs Lab 07/12/13 0630 07/15/13 0649 07/18/13 0824  WBC 21.5* 17.4* 14.1*  HGB 7.8* 8.0* 8.6*  HCT 25.8* 26.7* 28.3*  PLT 455* 608* 664*  MCV 95.2 93.4 91.6  MCH 28.8 28.0 27.8  MCHC 30.2 30.0 30.4  RDW 16.8* 16.3* 16.3*  LYMPHSABS  --   --  2.3  MONOABS  --   --  1.8*  EOSABS  --   --  0.3  BASOSABS  --   --  0.1    Chemistries   Recent Labs Lab 07/15/13 0649 07/18/13 0824  NA 141 138  K 4.6 4.1  CL 102 100  CO2 27 25  GLUCOSE 180* 206*  BUN 31* 36*  CREATININE 0.80 0.76  CALCIUM 10.1 10.1  MG  --  2.0  AST  --  36  ALT  --  16  ALKPHOS  --  106  BILITOT  --  <0.2*   ------------------------------------------------------------------------------------------------------------------ CrCl is unknown because both  a height and weight (above a minimum accepted value) are required for this calculation. ------------------------------------------------------------------------------------------------------------------ No results found for this basename: HGBA1C,  in the last 72 hours ------------------------------------------------------------------------------------------------------------------  Recent Labs  07/18/13 0824  TRIG 148   ------------------------------------------------------------------------------------------------------------------ No results found for this basename: TSH, T4TOTAL, FREET3, T3FREE, THYROIDAB,  in the last 72 hours ------------------------------------------------------------------------------------------------------------------ No results found for this basename: VITAMINB12, FOLATE, FERRITIN, TIBC, IRON, RETICCTPCT,  in the last 72 hours  Coagulation profile No results found for this basename: INR, PROTIME,  in the last 168 hours  No results found for this basename: DDIMER,  in the last 72 hours  Cardiac Enzymes No results found for this basename: CK, CKMB, TROPONINI, MYOGLOBIN,  in the last 168 hours ------------------------------------------------------------------------------------------------------------------ No components found with this basename: POCBNP,   Micro Results No results found for this or any previous visit (from the past 240 hour(s)).     Assessment & Plan   VDRF ; continue with ATC  trials.  Septic picture, peritonitis continue with IV antibiotics Abdominal wound; abdominal wound care to follow Chronic atrial food status post pacemaker, rate controlled Acute on chronic renal failure; monitor creatinine Klebsiella UTI; treated Protein calorie malnutrition continue with TPN for now will challenge to feeding by the end of the week Anemia Diabetes mellitus type 2 on Levemir and NovoLog Hypothyroidism continue with Synthroid Anoxic  encephalopathy; monitor History of retroperitoneal hematoma with no signs of acute bleed History of multidrug resistant Pseudomonas pneumonia treated with tobramycin in the past. Leukocytosis-improving  Plan  Start trickle Tube feeding  code Status: Full     DVT Prophylaxis  Lovenox    [name] M.D on 07/18/2013 at 1:12 PM

## 2013-07-20 ENCOUNTER — Other Ambulatory Visit (HOSPITAL_COMMUNITY): Payer: Self-pay

## 2013-07-21 LAB — COMPREHENSIVE METABOLIC PANEL
ALK PHOS: 107 U/L (ref 39–117)
ALT: 13 U/L (ref 0–35)
AST: 29 U/L (ref 0–37)
Albumin: 1.9 g/dL — ABNORMAL LOW (ref 3.5–5.2)
BILIRUBIN TOTAL: 0.2 mg/dL — AB (ref 0.3–1.2)
BUN: 32 mg/dL — AB (ref 6–23)
CHLORIDE: 103 meq/L (ref 96–112)
CO2: 27 meq/L (ref 19–32)
Calcium: 10.4 mg/dL (ref 8.4–10.5)
Creatinine, Ser: 0.72 mg/dL (ref 0.50–1.10)
GFR, EST NON AFRICAN AMERICAN: 80 mL/min — AB (ref >90)
Glucose, Bld: 207 mg/dL — ABNORMAL HIGH (ref 70–99)
Potassium: 4.1 mEq/L (ref 3.7–5.3)
Sodium: 143 mEq/L (ref 137–147)
Total Protein: 6.6 g/dL (ref 6.0–8.3)

## 2013-07-21 LAB — CBC
HEMATOCRIT: 30 % — AB (ref 36.0–46.0)
HEMOGLOBIN: 9.2 g/dL — AB (ref 12.0–15.0)
MCH: 28.7 pg (ref 26.0–34.0)
MCHC: 30.7 g/dL (ref 30.0–36.0)
MCV: 93.5 fL (ref 78.0–100.0)
Platelets: 640 10*3/uL — ABNORMAL HIGH (ref 150–400)
RBC: 3.21 MIL/uL — ABNORMAL LOW (ref 3.87–5.11)
RDW: 16.9 % — AB (ref 11.5–15.5)
WBC: 14.8 10*3/uL — ABNORMAL HIGH (ref 4.0–10.5)

## 2013-07-21 LAB — DIFFERENTIAL
BASOS ABS: 0 10*3/uL (ref 0.0–0.1)
BASOS PCT: 0 % (ref 0–1)
EOS PCT: 1 % (ref 0–5)
Eosinophils Absolute: 0.2 10*3/uL (ref 0.0–0.7)
Lymphocytes Relative: 18 % (ref 12–46)
Lymphs Abs: 2.7 10*3/uL (ref 0.7–4.0)
MONOS PCT: 9 % (ref 3–12)
Monocytes Absolute: 1.4 10*3/uL — ABNORMAL HIGH (ref 0.1–1.0)
Neutro Abs: 10.5 10*3/uL — ABNORMAL HIGH (ref 1.7–7.7)
Neutrophils Relative %: 72 % (ref 43–77)

## 2013-07-21 LAB — PHOSPHORUS: Phosphorus: 3.6 mg/dL (ref 2.3–4.6)

## 2013-07-21 LAB — PREALBUMIN: PREALBUMIN: 15.3 mg/dL — AB (ref 17.0–34.0)

## 2013-07-21 LAB — TRIGLYCERIDES: Triglycerides: 159 mg/dL — ABNORMAL HIGH (ref <150)

## 2013-07-21 LAB — MAGNESIUM: Magnesium: 2 mg/dL (ref 1.5–2.5)

## 2013-07-24 ENCOUNTER — Other Ambulatory Visit (HOSPITAL_COMMUNITY): Payer: Self-pay

## 2013-07-25 LAB — CBC WITH DIFFERENTIAL/PLATELET
BASOS ABS: 0 10*3/uL (ref 0.0–0.1)
BASOS PCT: 0 % (ref 0–1)
EOS PCT: 3 % (ref 0–5)
Eosinophils Absolute: 0.3 10*3/uL (ref 0.0–0.7)
HEMATOCRIT: 33.8 % — AB (ref 36.0–46.0)
Hemoglobin: 10.2 g/dL — ABNORMAL LOW (ref 12.0–15.0)
LYMPHS PCT: 24 % (ref 12–46)
Lymphs Abs: 2.5 10*3/uL (ref 0.7–4.0)
MCH: 28 pg (ref 26.0–34.0)
MCHC: 30.2 g/dL (ref 30.0–36.0)
MCV: 92.9 fL (ref 78.0–100.0)
MONO ABS: 1.4 10*3/uL — AB (ref 0.1–1.0)
Monocytes Relative: 13 % — ABNORMAL HIGH (ref 3–12)
Neutro Abs: 6.3 10*3/uL (ref 1.7–7.7)
Neutrophils Relative %: 60 % (ref 43–77)
Platelets: 484 10*3/uL — ABNORMAL HIGH (ref 150–400)
RBC: 3.64 MIL/uL — ABNORMAL LOW (ref 3.87–5.11)
RDW: 17.6 % — AB (ref 11.5–15.5)
WBC: 10.5 10*3/uL (ref 4.0–10.5)

## 2013-07-25 LAB — BASIC METABOLIC PANEL
BUN: 41 mg/dL — ABNORMAL HIGH (ref 6–23)
CALCIUM: 11.8 mg/dL — AB (ref 8.4–10.5)
CO2: 29 meq/L (ref 19–32)
CREATININE: 1.01 mg/dL (ref 0.50–1.10)
Chloride: 103 mEq/L (ref 96–112)
GFR calc non Af Amer: 52 mL/min — ABNORMAL LOW (ref >90)
GFR, EST AFRICAN AMERICAN: 60 mL/min — AB (ref >90)
Glucose, Bld: 157 mg/dL — ABNORMAL HIGH (ref 70–99)
Potassium: 4.4 mEq/L (ref 3.7–5.3)
Sodium: 141 mEq/L (ref 137–147)

## 2013-07-25 NOTE — Progress Notes (Addendum)
[facility]                                                                                              Progress note     Patient Demographics  [name], is a 80 y.o. female  WUJ:[id number]  NFA:[id number]  DOB - 06/21/1932  Admit date - 07/08/2013  Admitting Physician Elnora Morrison, MD  Outpatient Primary MD for the patient is [name], MD  LOS - 17  Chief complaint Respiratory failure Abdominal wound      Subjective:   [name] obtunded cannot give any history  Objective:   Vital signs  Temperature 98.2 Heart rate 94 Respiratory rate 18 Blood pressure 115/59 Pulse ox 100 %    Exam Obtunded [place].AT,PERRAL Supple Neck,No JVD, No cervical lymphadenopathy appriciated.  Trach in place Symmetrical Chest wall movement, diminished bilaterally with scattered rhonchi RRR,No Gallops,Rubs or new Murmurs, No Parasternal Heave Decreased B.Sounds, abdominal binder in place.  No Cyanosis, Clubbing or edema, No new Rash or bruise     I&Os 1660/2650 Peg Tube Foley - yes Trach #4  Data Review   CBC  Recent Labs Lab 07/21/13 0630 07/25/13 0340  WBC 14.8* 10.5  HGB 9.2* 10.2*  HCT 30.0* 33.8*  PLT 640* 484*  MCV 93.5 92.9  MCH 28.7 28.0  MCHC 30.7 30.2  RDW 16.9* 17.6*  LYMPHSABS 2.7 2.5  MONOABS 1.4* 1.4*  EOSABS 0.2 0.3  BASOSABS 0.0 0.0    Chemistries   Recent Labs Lab 07/21/13 0630 07/25/13 0340  NA 143 141  K 4.1 4.4  CL 103 103  CO2 27 29  GLUCOSE 207* 157*  BUN 32* 41*  CREATININE 0.72 1.01  CALCIUM 10.4 11.8*  MG 2.0  --   AST 29  --   ALT 13  --   ALKPHOS 107  --   BILITOT 0.2*  --    ------------------------------------------------------------------------------------------------------------------ CrCl is unknown because both a height and weight (above a minimum accepted value) are required for this  calculation. ------------------------------------------------------------------------------------------------------------------ No results found for this basename: HGBA1C,  in the last 72 hours ------------------------------------------------------------------------------------------------------------------ No results found for this basename: CHOL, HDL, LDLCALC, TRIG, CHOLHDL, LDLDIRECT,  in the last 72 hours ------------------------------------------------------------------------------------------------------------------ No results found for this basename: TSH, T4TOTAL, FREET3, T3FREE, THYROIDAB,  in the last 72 hours ------------------------------------------------------------------------------------------------------------------ No results found for this basename: VITAMINB12, FOLATE, FERRITIN, TIBC, IRON, RETICCTPCT,  in the last 72 hours  Coagulation profile No results found for this basename: INR, PROTIME,  in the last 168 hours  No results found for this basename: DDIMER,  in the last 72 hours  Cardiac Enzymes No results found for this basename: CK, CKMB, TROPONINI, MYOGLOBIN,  in the last 168 hours ------------------------------------------------------------------------------------------------------------------ No components found with this basename: POCBNP,   Micro Results No results found for this or any previous visit (from the past 240 hour(s)).     Assessment & Plan   VDRF ; continue with ATC 28% Septic picture, peritonitis continue with IV antibiotics Abdominal wound; abdominal wound care to follow Chronic atrial Fib status post pacemaker, rate controlled Acute on  chronic renal failure; monitor creatinine Klebsiella UTI; treated Protein calorie malnutrition continue with TPN for now will challenge to feeding by the end of the week Anemia Diabetes mellitus type 2 on Levemir and NovoLog Hypothyroidism continue with Synthroid Anoxic encephalopathy; monitor History of  retroperitoneal hematoma with no signs of acute bleed History of multidrug resistant Pseudomonas pneumonia treated with tobramycin in the past. Leukocytosis-improving  Plan  Check labs in a.m. I am not sure which options  I have for feeding her Will discuss with surgery in a.m.  code Status: Full     DVT Prophylaxis  Lovenox    Carron CurieAli Tai Syfert M.D on 07/25/2013 at 2:00 PM

## 2013-07-26 ENCOUNTER — Other Ambulatory Visit (HOSPITAL_COMMUNITY): Payer: Self-pay

## 2013-07-26 DIAGNOSIS — R6520 Severe sepsis without septic shock: Secondary | ICD-10-CM

## 2013-07-26 DIAGNOSIS — A419 Sepsis, unspecified organism: Secondary | ICD-10-CM

## 2013-07-26 DIAGNOSIS — N179 Acute kidney failure, unspecified: Secondary | ICD-10-CM

## 2013-07-26 DIAGNOSIS — N189 Chronic kidney disease, unspecified: Secondary | ICD-10-CM

## 2013-07-26 DIAGNOSIS — L02219 Cutaneous abscess of trunk, unspecified: Secondary | ICD-10-CM

## 2013-07-26 DIAGNOSIS — L03319 Cellulitis of trunk, unspecified: Secondary | ICD-10-CM

## 2013-07-26 MED ORDER — IOHEXOL 300 MG/ML  SOLN
150.0000 mL | Freq: Once | INTRAMUSCULAR | Status: AC | PRN
  Administered 2013-07-26: 100 mL

## 2013-07-26 NOTE — Progress Notes (Signed)
[facility]                                                                                              Progress note     Patient Demographics  [name], is a 80 y.o. female  ZOX:[id number]  WJX:[id number]  DOB - 08/23/1932  Admit date - 07/08/2013  Admitting Physician Elnora MorrisonAhmad B Barakat, MD  Outpatient Primary MD for the patient is Astrid DivineGRIFFIN,ELAINE COLLINS, MD  LOS - 18  Chief complaint  Respiratory failure  Abdominal wound      Subjective:   Angela NorrisShirley Fitzgerald obtunded cannot give any history .  Objective:   Vital signs  Temperature 98.4 Heart rate 98 Respiratory rate 18 Blood pressure 142/70 Pulse ox 100 %    Exam Obtunded [place].AT,PERRAL, NG tube in place Supple Neck,No JVD, No cervical lymphadenopathy appriciated.  Trach in place Symmetrical Chest wall movement, diminished bilaterally with scattered rhonchi RRR,No Gallops,Rubs or new Murmurs, No Parasternal Heave Decreased B.Sounds, abdominal binder in place. Wound noted slight improvement No Cyanosis, Clubbing or edema, No new Rash or bruise     I&Os 1720/1450 Peg Tube Foley - yes Trach #4  Data Review   CBC  Recent Labs Lab 07/21/13 0630 07/25/13 0340  WBC 14.8* 10.5  HGB 9.2* 10.2*  HCT 30.0* 33.8*  PLT 640* 484*  MCV 93.5 92.9  MCH 28.7 28.0  MCHC 30.7 30.2  RDW 16.9* 17.6*  LYMPHSABS 2.7 2.5  MONOABS 1.4* 1.4*  EOSABS 0.2 0.3  BASOSABS 0.0 0.0    Chemistries   Recent Labs Lab 07/21/13 0630 07/25/13 0340  NA 143 141  K 4.1 4.4  CL 103 103  CO2 27 29  GLUCOSE 207* 157*  BUN 32* 41*  CREATININE 0.72 1.01  CALCIUM 10.4 11.8*  MG 2.0  --   AST 29  --   ALT 13  --   ALKPHOS 107  --   BILITOT 0.2*  --    ------------------------------------------------------------------------------------------------------------------ CrCl is unknown because both a height and weight (above a minimum  accepted value) are required for this calculation. ------------------------------------------------------------------------------------------------------------------ No results found for this basename: HGBA1C,  in the last 72 hours ------------------------------------------------------------------------------------------------------------------ No results found for this basename: CHOL, HDL, LDLCALC, TRIG, CHOLHDL, LDLDIRECT,  in the last 72 hours ------------------------------------------------------------------------------------------------------------------ No results found for this basename: TSH, T4TOTAL, FREET3, T3FREE, THYROIDAB,  in the last 72 hours ------------------------------------------------------------------------------------------------------------------ No results found for this basename: VITAMINB12, FOLATE, FERRITIN, TIBC, IRON, RETICCTPCT,  in the last 72 hours  Coagulation profile No results found for this basename: INR, PROTIME,  in the last 168 hours  No results found for this basename: DDIMER,  in the last 72 hours  Cardiac Enzymes No results found for this basename: CK, CKMB, TROPONINI, MYOGLOBIN,  in the last 168 hours ------------------------------------------------------------------------------------------------------------------ No components found with this basename: POCBNP,   Micro Results No results found for this or any previous visit (from the past 240 hour(s)).     Assessment & Plan   Respiratory failure ; continue with ATC 28% Septic picture, peritonitis continue with IV antibiotics Abdominal wound; abdominal wound care to  follow Chronic atrial Fib status post pacemaker, rate controlled Acute on chronic renal failure; monitor creatinine Klebsiella UTI; treated Protein calorie malnutrition continue with TPN and start NG tube feeding Anemia Diabetes mellitus type 2 on Levemir and NovoLog Hypothyroidism continue with Synthroid Anoxic  encephalopathy; monitor History of retroperitoneal hematoma with no signs of acute bleed History of multidrug resistant Pseudomonas pneumonia treated with tobramycin in the past. Leukocytosis-improving  Plan  NG tube was placed and upper GI did not show any leakage. Will start NG tube feeding trials Critical care time 36 minutes code Status: Full     DVT Prophylaxis  Lovenox    Carron Curie M.D on 07/26/2013 at 2:12 PM

## 2013-07-26 NOTE — Progress Notes (Signed)
PULMONARY / CRITICAL CARE MEDICINE  Name: [name] MRN: [id number] DOB: [birth year]    ADMISSION DATE:  07/08/2013 CONSULTATION DATE:  07/11/2013  REFERRING MD :  Sharyon Medicus   CHIEF COMPLAINT:  Weaning   BRIEF PATIENT DESCRIPTION: 80 yo chronically ill woman with complicated hospitalization to [facility], [facility], [facility] and then back to [facility] before discharge to SNF. She is s/p trach and PEG in aftermath of gastroenteritis + ARDS, retroperitoneal bleed, pneumothorax; then PNA (with MDR Pseudomonas), septic and hemorrhagic shock. She was weaned to [facility] and was discharged to SNF for further care. She was admitted with fever, abdominal pain and erythema of her abdominal wall 5/1. Was found to have subcutaneous tissue infection due to malpositioned PEG requiring significant debridement 5/1. Transferred back to [facility] for weaning efforts   VITAL SIGNS:  Reviewed  PHYSICAL EXAMINATION:  General: No distress Neuro: No focal deficits,follows commands  HEENT: Tracheostomy site intact Cardiovascular: Regular, no murmurs Lungs: Bilateral diminished air entry, few rhonchi Abdomen: Soft, bowel sounds present Ext: No edema, warm  LABS: CBC  Recent Labs Lab 07/21/13 0630 07/25/13 0340  WBC 14.8* 10.5  HGB 9.2* 10.2*  HCT 30.0* 33.8*  PLT 640* 484*   Coag's No results found for this basename: APTT, INR,  in the last 168 hours BMET  Recent Labs Lab 07/21/13 0630 07/25/13 0340  NA 143 141  K 4.1 4.4  CL 103 103  CO2 27 29  BUN 32* 41*  CREATININE 0.72 1.01  GLUCOSE 207* 157*   Electrolytes  Recent Labs Lab 07/21/13 0630 07/25/13 0340  CALCIUM 10.4 11.8*  MG 2.0  --   PHOS 3.6  --    Sepsis Markers No results found for this basename: LATICACIDVEN, PROCALCITON, O2SATVEN,  in the last 168 hours ABG No results found for this basename: PHART, PCO2ART, PO2ART,  in the last 168 hours Liver Enzymes  Recent Labs Lab 07/21/13 0630  AST 29  ALT 13  ALKPHOS 107  BILITOT 0.2*   ALBUMIN 1.9*   Cardiac Enzymes No results found for this basename: TROPONINI, PROBNP,  in the last 168 hours Glucose No results found for this basename: GLUCAP,  in the last 168 hours  IMAGING: Dg Ugi W/water Sol Cm  07/26/2013   CLINICAL DATA:  History of displaced G-tube with anterior abdominal wall abscess. Patient is status post abscess debridement with surgical repositioning of the G-tube.  EXAM: WATER SOLUBLE UPPER GI SERIES  TECHNIQUE: Single-column upper GI series was performed using water soluble contrast.  CONTRAST:  Water-soluble contrast material was given through the existing NG tube.  COMPARISON:  CT scan from 07/11/2013.  FLUOROSCOPY TIME:  2 min and 2 seconds.  FINDINGS: Pre-procedure KUB shows a nonspecific bowel gas pattern. NG tube tip overlies the distal stomach. T-Tube is also seen with the tip overlying the location of the distal stomach.  Under fluoroscopic guidance, approximately 100 cc water-soluble contrast was given via the NG tube to opacified the gastric lumen. There is no evidence for contrast extravasation from the stomach lumen to suggest a leak. The patient was assessed in multiple positions including a right-side-down decubitus view to maximally filled the distal stomach with contrast material. Even in this position there is no evidence for contrast leak from the stomach lumen.  IMPRESSION: No evidence for contrast leak from the stomach lumen.  The tip of the gastrostomy tube was positioned within the stomach lumen on the previous CT scan. The contrast on today's study apparently  obscures the tip of the gastrostomy tube which is not outlined by the contrast pooling in the distal stomach on today's study.   Electronically Signed   By: Kennith CenterEric  Mansell M.D.   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 Her promary issue at this point is her wound care, nutritional status  and deconditioning. She is not a candidate for decannulation given her current status and would need to make significant gains for this to be a consideration.   Plan  Trach collar as tolerated for goal SpO2>92  PMV as tolerated, increase to all day if able  Nutrition  Wound care   PT/OT  Lasix to negative balance  PCCM will s/o call as needed , no decannulation planned, need improved rehab, physical therapy  07/26/2013, 2:56 PM  I have fully examined this patient and agree with above findings.      Mcarthur Rossettianiel J. Tyson AliasFeinstein, MD, FACP Pgr: [phone] [facility]

## 2013-07-27 NOTE — Progress Notes (Signed)
[facility]                                                                                              Progress note     Patient Demographics  Angela NorrisShirley Ficken, is a 80 y.o. female  [id number]  JJO:[id number]  DOB - 03/30/1932  Admit date - 07/08/2013  Admitting Physician Elnora MorrisonAhmad B Barakat, MD  Outpatient Primary MD for the patient is Astrid DivineGRIFFIN,ELAINE COLLINS, MD  LOS - 19  Chief complaint  Respiratory failure  Abdominal wound      Subjective:   Angela NorrisShirley Trella obtunded cannot give any history .  Objective:   Vital signs  Temperature 98.4 Heart rate 84 Respiratory rate 18 Blood pressure 142/70 Pulse ox 99 %    Exam Obtunded [place].AT,PERRAL, NG tube in place Supple Neck,No JVD, No cervical lymphadenopathy appriciated.  Trach in place Symmetrical Chest wall movement, diminished bilaterally with scattered rhonchi RRR,No Gallops,Rubs or new Murmurs, No Parasternal Heave Decreased B.Sounds, abdominal binder in place. Wound noted slight improvement No Cyanosis, Clubbing or edema, No new Rash or bruise     I&Os 1540/1400 Foley - yes Trach #4  Data Review   CBC  Recent Labs Lab 07/21/13 0630 07/25/13 0340  WBC 14.8* 10.5  HGB 9.2* 10.2*  HCT 30.0* 33.8*  PLT 640* 484*  MCV 93.5 92.9  MCH 28.7 28.0  MCHC 30.7 30.2  RDW 16.9* 17.6*  LYMPHSABS 2.7 2.5  MONOABS 1.4* 1.4*  EOSABS 0.2 0.3  BASOSABS 0.0 0.0    Chemistries   Recent Labs Lab 07/21/13 0630 07/25/13 0340  NA 143 141  K 4.1 4.4  CL 103 103  CO2 27 29  GLUCOSE 207* 157*  BUN 32* 41*  CREATININE 0.72 1.01  CALCIUM 10.4 11.8*  MG 2.0  --   AST 29  --   ALT 13  --   ALKPHOS 107  --   BILITOT 0.2*  --    ------------------------------------------------------------------------------------------------------------------ CrCl is unknown because both a height and weight (above a minimum accepted value)  are required for this calculation. ------------------------------------------------------------------------------------------------------------------ No results found for this basename: HGBA1C,  in the last 72 hours ------------------------------------------------------------------------------------------------------------------ No results found for this basename: CHOL, HDL, LDLCALC, TRIG, CHOLHDL, LDLDIRECT,  in the last 72 hours ------------------------------------------------------------------------------------------------------------------ No results found for this basename: TSH, T4TOTAL, FREET3, T3FREE, THYROIDAB,  in the last 72 hours ------------------------------------------------------------------------------------------------------------------ No results found for this basename: VITAMINB12, FOLATE, FERRITIN, TIBC, IRON, RETICCTPCT,  in the last 72 hours  Coagulation profile No results found for this basename: INR, PROTIME,  in the last 168 hours  No results found for this basename: DDIMER,  in the last 72 hours  Cardiac Enzymes No results found for this basename: CK, CKMB, TROPONINI, MYOGLOBIN,  in the last 168 hours ------------------------------------------------------------------------------------------------------------------ No components found with this basename: POCBNP,   Micro Results No results found for this or any previous visit (from the past 240 hour(s)).     Assessment & Plan   Respiratory failure ; continue with ATC 28% Septic picture, peritonitis continue with IV antibiotics Abdominal wound; abdominal wound care to follow Chronic  atrial Fib status post pacemaker, rate controlled Acute on chronic renal failure; monitor creatinine Klebsiella UTI; treated Protein calorie malnutrition continue with TPN and start NG tube feeding Anemia Diabetes mellitus type 2 on Levemir and NovoLog Hypothyroidism continue with Synthroid Anoxic encephalopathy;  monitor History of retroperitoneal hematoma with no signs of acute bleed History of multidrug resistant Pseudomonas pneumonia treated with tobramycin in the past. Leukocytosis-improving PEG tube pulled out with no leakage noted Plan Check chest x-ray, labs in a.m.  advance NG tube feeding and taper TPN  code Status: Full     DVT Prophylaxis  Lovenox    Carron Curie M.D on 07/27/2013 at 1:36 PM

## 2013-07-28 ENCOUNTER — Inpatient Hospital Stay (HOSPITAL_COMMUNITY)
Admission: AD | Admit: 2013-07-28 | Discharge: 2013-07-28 | Disposition: A | Discharge status: Home / Self Care | Payer: Self-pay | Source: Ambulatory Visit | Attending: Internal Medicine | Admitting: Internal Medicine

## 2013-07-28 DIAGNOSIS — L03311 Cellulitis of abdominal wall: Secondary | ICD-10-CM

## 2013-07-28 DIAGNOSIS — R404 Transient alteration of awareness: Secondary | ICD-10-CM

## 2013-07-28 DIAGNOSIS — L02219 Cutaneous abscess of trunk, unspecified: Secondary | ICD-10-CM

## 2013-07-28 DIAGNOSIS — J9620 Acute and chronic respiratory failure, unspecified whether with hypoxia or hypercapnia: Secondary | ICD-10-CM

## 2013-07-28 DIAGNOSIS — L03319 Cellulitis of trunk, unspecified: Secondary | ICD-10-CM

## 2013-07-28 DIAGNOSIS — R410 Disorientation, unspecified: Secondary | ICD-10-CM

## 2013-07-28 NOTE — Progress Notes (Signed)
EEG completed; results pending.    

## 2013-07-28 NOTE — Progress Notes (Signed)
PULMONARY / CRITICAL CARE MEDICINE  Name: [name] MRN: [id number] DOB: Feb 08, 1933    ADMISSION DATE:  07/08/2013 CONSULTATION DATE:  07/11/2013  REFERRING MD :  Sharyon Medicus   CHIEF COMPLAINT:  Weaning   BRIEF PATIENT DESCRIPTION: 80 yo chronically ill woman with complicated hospitalization to [facility], [facility], [facility] and then back to [facility] before discharge to SNF. She is s/p trach and PEG in aftermath of gastroenteritis + ARDS, retroperitoneal bleed, pneumothorax; then PNA (with MDR Pseudomonas), septic and hemorrhagic shock. She was weaned to [facility] at [facility] and was discharged to SNF for further care. She was admitted with fever, abdominal pain and erythema of her abdominal wall 5/1. Was found to have subcutaneous tissue infection due to malpositioned PEG requiring significant debridement 5/1. Transferred back to [facility] for weaning efforts   VITAL SIGNS:  Reviewed  PHYSICAL EXAMINATION:  General: No distress Neuro: lethargic, does not follows commands, int moves arms HEENT: Tracheostomy site intact Cardiovascular: Regular, no murmurs Lungs: rhonchi diffuse Abdomen: Soft, bowel sounds present Ext: No edema, warm  LABS: CBC  Recent Labs Lab 07/25/13 0340  WBC 10.5  HGB 10.2*  HCT 33.8*  PLT 484*   Coag's No results found for this basename: APTT, INR,  in the last 168 hours BMET  Recent Labs Lab 07/25/13 0340  NA 141  K 4.4  CL 103  CO2 29  BUN 41*  CREATININE 1.01  GLUCOSE 157*   Electrolytes  Recent Labs Lab 07/25/13 0340  CALCIUM 11.8*   Sepsis Markers No results found for this basename: LATICACIDVEN, PROCALCITON, O2SATVEN,  in the last 168 hours ABG No results found for this basename: PHART, PCO2ART, PO2ART,  in the last 168 hours Liver Enzymes No results found for this basename: AST, ALT, ALKPHOS, BILITOT, ALBUMIN,  in the last 168 hours Cardiac Enzymes No results found for this basename: TROPONINI, PROBNP,  in the last 168 hours Glucose No results found for this  basename: GLUCAP,  in the last 168 hours  IMAGING: No results found.  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  Consider head CT, eeg assessment  Continued trach collar trial  Consider dnr and no vent in future if fails  Call if needed further  07/28/2013, 3:35 PM  I have fully examined this patient and agree with above findings.      Mcarthur Rossetti. Tyson Alias, MD, FACP Pgr: [phone] [place]

## 2013-07-28 NOTE — Progress Notes (Signed)
[facility]                                                                                              Progress note     Patient Demographics  [name], is a 80 y.o. female  QQP:[id number]  [id number]  DOB - 01/21/1933  Admit date - 07/08/2013  Admitting Physician Elnora Morrison, MD  Outpatient Primary MD for the patient is [name], MD  LOS - 20  Chief complaint  Respiratory failure  Abdominal wound      Subjective:   [name] obtunded cannot give any history .  Objective:   Vital signs  Temperature 97.3 Heart rate 61 Respiratory rate 17 Blood pressure 120/75 Pulse ox 95 %    Exam Obtunded [place].AT,PERRAL, NG tube in place Supple Neck,No JVD, No cervical lymphadenopathy appriciated.  Trach in place Symmetrical Chest wall movement, diminished bilaterally with scattered rhonchi RRR,No Gallops,Rubs or new Murmurs, No Parasternal Heave Decreased B.Sounds, abdominal binder in place. Wound noted slight improvement No Cyanosis, Clubbing or edema, No new Rash or bruise     I&Os 1240/1400 Foley - yes Trach #4  Data Review   CBC  Recent Labs Lab 07/25/13 0340  WBC 10.5  HGB 10.2*  HCT 33.8*  PLT 484*  MCV 92.9  MCH 28.0  MCHC 30.2  RDW 17.6*  LYMPHSABS 2.5  MONOABS 1.4*  EOSABS 0.3  BASOSABS 0.0    Chemistries   Recent Labs Lab 07/25/13 0340  NA 141  K 4.4  CL 103  CO2 29  GLUCOSE 157*  BUN 41*  CREATININE 1.01  CALCIUM 11.8*   ------------------------------------------------------------------------------------------------------------------ CrCl is unknown because both a height and weight (above a minimum accepted value) are required for this calculation. ------------------------------------------------------------------------------------------------------------------ No results found for this basename: HGBA1C,  in the  last 72 hours ------------------------------------------------------------------------------------------------------------------ No results found for this basename: CHOL, HDL, LDLCALC, TRIG, CHOLHDL, LDLDIRECT,  in the last 72 hours ------------------------------------------------------------------------------------------------------------------ No results found for this basename: TSH, T4TOTAL, FREET3, T3FREE, THYROIDAB,  in the last 72 hours ------------------------------------------------------------------------------------------------------------------ No results found for this basename: VITAMINB12, FOLATE, FERRITIN, TIBC, IRON, RETICCTPCT,  in the last 72 hours  Coagulation profile No results found for this basename: INR, PROTIME,  in the last 168 hours  No results found for this basename: DDIMER,  in the last 72 hours  Cardiac Enzymes No results found for this basename: CK, CKMB, TROPONINI, MYOGLOBIN,  in the last 168 hours ------------------------------------------------------------------------------------------------------------------ No components found with this basename: POCBNP,   Micro Results No results found for this or any previous visit (from the past 240 hour(s)).     Assessment & Plan   Respiratory failure ; continue with ATC 28% Septic picture, peritonitis continue with IV antibiotics Abdominal wound; abdominal wound care to follow Chronic atrial Fib status post pacemaker, rate controlled Acute on chronic renal failure; monitor creatinine Klebsiella UTI; treated Protein calorie malnutrition continue with TPN patient pulled the NG tube out  Anemia Diabetes mellitus type 2 on Levemir and NovoLog Hypothyroidism continue with Synthroid Anoxic encephalopathy; monitor History of retroperitoneal hematoma with no signs of  acute bleed History of multidrug resistant Pseudomonas pneumonia treated with tobramycin in the past. Leukocytosis-improving PEG tube pulled out  with no leakage noted Plan Continue with TPN Patient pulled the NG tube out  We will replace it however will give her a few days rest before inserting the NG tube again under radiology guidance Check CT of the head and EEG to assess brain function and rule out seizures Labs in a.m. Discussed with family at x45 minutes today about CODE STATUS. They are still adamant about full code  code Status: Full     DVT Prophylaxis  Lovenox    Carron CurieAli Carole Doner M.D on 07/28/2013 at 12:08 PM

## 2013-07-29 LAB — MAGNESIUM: Magnesium: 2.1 mg/dL (ref 1.5–2.5)

## 2013-07-29 LAB — COMPREHENSIVE METABOLIC PANEL
ALT: 16 U/L (ref 0–35)
AST: 33 U/L (ref 0–37)
Albumin: 2.5 g/dL — ABNORMAL LOW (ref 3.5–5.2)
Alkaline Phosphatase: 96 U/L (ref 39–117)
BILIRUBIN TOTAL: 0.3 mg/dL (ref 0.3–1.2)
BUN: 46 mg/dL — ABNORMAL HIGH (ref 6–23)
CALCIUM: 12.6 mg/dL — AB (ref 8.4–10.5)
CO2: 30 meq/L (ref 19–32)
CREATININE: 1.13 mg/dL — AB (ref 0.50–1.10)
Chloride: 103 mEq/L (ref 96–112)
GFR calc Af Amer: 52 mL/min — ABNORMAL LOW (ref >90)
GFR, EST NON AFRICAN AMERICAN: 45 mL/min — AB (ref >90)
GLUCOSE: 199 mg/dL — AB (ref 70–99)
Potassium: 4 mEq/L (ref 3.7–5.3)
SODIUM: 145 meq/L (ref 137–147)
Total Protein: 7.3 g/dL (ref 6.0–8.3)

## 2013-07-29 LAB — TRIGLYCERIDES: Triglycerides: 276 mg/dL — ABNORMAL HIGH (ref <150)

## 2013-07-29 LAB — CBC
HCT: 35.2 % — ABNORMAL LOW (ref 36.0–46.0)
HEMOGLOBIN: 10.8 g/dL — AB (ref 12.0–15.0)
MCH: 28.6 pg (ref 26.0–34.0)
MCHC: 30.7 g/dL (ref 30.0–36.0)
MCV: 93.4 fL (ref 78.0–100.0)
Platelets: 390 10*3/uL (ref 150–400)
RBC: 3.77 MIL/uL — AB (ref 3.87–5.11)
RDW: 17.1 % — ABNORMAL HIGH (ref 11.5–15.5)
WBC: 12.6 10*3/uL — ABNORMAL HIGH (ref 4.0–10.5)

## 2013-07-29 LAB — DIFFERENTIAL
BASOS PCT: 0 % (ref 0–1)
Basophils Absolute: 0 10*3/uL (ref 0.0–0.1)
EOS ABS: 0.1 10*3/uL (ref 0.0–0.7)
EOS PCT: 1 % (ref 0–5)
LYMPHS ABS: 2.8 10*3/uL (ref 0.7–4.0)
Lymphocytes Relative: 23 % (ref 12–46)
MONO ABS: 1.7 10*3/uL — AB (ref 0.1–1.0)
MONOS PCT: 14 % — AB (ref 3–12)
Neutro Abs: 7.8 10*3/uL — ABNORMAL HIGH (ref 1.7–7.7)
Neutrophils Relative %: 62 % (ref 43–77)

## 2013-07-29 LAB — PREALBUMIN: Prealbumin: 21.1 mg/dL (ref 17.0–34.0)

## 2013-07-29 LAB — PHOSPHORUS: Phosphorus: 4.5 mg/dL (ref 2.3–4.6)

## 2013-07-29 NOTE — Progress Notes (Signed)
[facility]                                                                                              Progress note     Patient Demographics  [name] Allbright, is a 80 y.o. female  [id number]:[id number]  [id number]:[id number]  DOB - 02/02/1933  Admit date - 07/08/2013  Admitting Physician Elnora MorrisonAhmad B Barakat, MD  Outpatient Primary MD for the patient is Astrid DivineGRIFFIN,ELAINE COLLINS, MD  LOS - 21  Chief complaint  Respiratory failure  Abdominal wound      Subjective:   [name] [place] obtunded cannot give any history .  Objective:   Vital signs  Temperature 98 Heart rate 89 Respiratory rate 20 Blood pressure 135/67 Pulse ox 99 %    Exam Obtunded [place].AT,PERRAL, NG tube in place Supple Neck,No JVD, No cervical lymphadenopathy appriciated.  Trach in place Symmetrical Chest wall movement, diminished bilaterally with scattered rhonchi RRR,No Gallops,Rubs or new Murmurs, No Parasternal Heave Decreased B.Sounds, abdominal binder in place. Wound noted slight improvement No Cyanosis, Clubbing or edema, No new Rash or bruise     I&Os 1440/1675 Foley - yes Trach #4  Data Review   CBC  Recent Labs Lab 07/25/13 0340 07/29/13 1140  WBC 10.5 12.6*  HGB 10.2* 10.8*  HCT 33.8* 35.2*  PLT 484* 390  MCV 92.9 93.4  MCH 28.0 28.6  MCHC 30.2 30.7  RDW 17.6* 17.1*  LYMPHSABS 2.5 2.8  MONOABS 1.4* 1.7*  EOSABS 0.3 0.1  BASOSABS 0.0 0.0    Chemistries   Recent Labs Lab 07/25/13 0340 07/29/13 1140  NA 141 145  K 4.4 4.0  CL 103 103  CO2 29 30  GLUCOSE 157* 199*  BUN 41* 46*  CREATININE 1.01 1.13*  CALCIUM 11.8* 12.6*  MG  --  2.1  AST  --  33  ALT  --  16  ALKPHOS  --  96  BILITOT  --  0.3   ------------------------------------------------------------------------------------------------------------------ CrCl is unknown because both a height and weight (above a minimum accepted value) are  required for this calculation. ------------------------------------------------------------------------------------------------------------------ No results found for this basename: HGBA1C,  in the last 72 hours ------------------------------------------------------------------------------------------------------------------  Recent Labs  07/29/13 1140  TRIG 276*   ------------------------------------------------------------------------------------------------------------------ No results found for this basename: TSH, T4TOTAL, FREET3, T3FREE, THYROIDAB,  in the last 72 hours ------------------------------------------------------------------------------------------------------------------ No results found for this basename: VITAMINB12, FOLATE, FERRITIN, TIBC, IRON, RETICCTPCT,  in the last 72 hours  Coagulation profile No results found for this basename: INR, PROTIME,  in the last 168 hours  No results found for this basename: DDIMER,  in the last 72 hours  Cardiac Enzymes No results found for this basename: CK, CKMB, TROPONINI, MYOGLOBIN,  in the last 168 hours ------------------------------------------------------------------------------------------------------------------ No components found with this basename: POCBNP,   Micro Results No results found for this or any previous visit (from the past 240 hour(s)).     Assessment & Plan   Respiratory failure ; continue with ATC 28% Septic picture, peritonitis continue with IV antibiotics Abdominal wound; abdominal wound care to follow Chronic atrial Fib status post pacemaker, rate controlled  Acute on chronic renal failure; monitor creatinine Klebsiella UTI; treated Protein calorie malnutrition continue with TPN patient pulled the NG tube out  Anemia Diabetes mellitus type 2 on Levemir and NovoLog Hypothyroidism continue with Synthroid Anoxic encephalopathy; monitor History of retroperitoneal hematoma with no signs of acute  bleed History of multidrug resistant Pseudomonas pneumonia treated with tobramycin in the past. Leukocytosis-improving PEG tube pulled out with no leakage noted Plan Continue with TPN DC Ativan and scopolamine patch(family request)  Discussed with family at x30 minutes today about CODE STATUS. They are still adamant about full code Critical care time; 36 minutes code Status: Full     DVT Prophylaxis  Lovenox    Carron Curie M.D on 07/29/2013 at 3:23 PM

## 2013-07-29 NOTE — Procedures (Signed)
ELECTROENCEPHALOGRAM REPORT  Patient: [name]       Room #: [facility] 5734 EEG No. ID: [id number] Age: 80 y.o.        Sex: female Referring Physician: A. Hijazi Report Date:  07/29/2013        Interpreting Physician: Noel Christmas  History: [name] is an 80 y.o. female the complicated history including gastroenteritis, ARDS, retroperitoneal hemorrhage, pneumothorax, pneumonia and septic in hemorrhagic shock. Patient is also a subsequent wound infection associated with malpositioned PEG.  Indications for study:  Mental status changes with confusion; assess severity of encephalopathy.  Technique: This is an 18 channel routine scalp EEG performed at the bedside with bipolar and monopolar montages arranged in accordance to the international 10/20 system of electrode placement.   Description: This EEG recording was performed during wakefulness. Patient was noted to be confused and slightly agitated during the study. On the background activity consisted of low amplitude 1-2 Hz diffuse symmetrical delta activity with superimposed 6-7 Hz theta activity which is most prominent posteriorly. Photic stimulation and hyperventilation were not performed. No epileptiform discharges were ordered.  Interpretation:  EEG is abnormal with mild generalized nonspecific continuous slowing of cerebral activity consistent with mild diffuse encephalopathy. This pattern of slowing can be seen with metabolic as well as degenerative and toxic encephalopathies. No evidence of an epileptic disorder was demonstrated.   Venetia Maxon M.D. [facility] [phone]

## 2013-07-30 NOTE — Progress Notes (Signed)
[facility]                                                                                              Progress note     Patient Demographics  [name], is a 80 y.o. female  [id number]  [id number]  DOB - 05/20/1932  Admit date - 07/08/2013  Admitting Physician Elnora MorrisonAhmad B Barakat, MD  Outpatient Primary MD for the patient is Astrid DivineGRIFFIN,ELAINE COLLINS, MD  LOS - 22  Chief complaint  Respiratory failure  Abdominal wound      Subjective:   Angela NorrisShirley Campise obtunded cannot give any history .  Objective:   Vital signs  Temperature 98.2 Heart rate 89 Respiratory rate 21 Blood pressure 163/79 Pulse ox 100 %    Exam Obtunded [place].AT,PERRAL, NG tube in place Supple Neck,No JVD, No cervical lymphadenopathy appriciated.  Trach in place Symmetrical Chest wall movement, diminished bilaterally with scattered rhonchi RRR,No Gallops,Rubs or new Murmurs, No Parasternal Heave Decreased B.Sounds, abdominal binder in place. Wound noted slight improvement No Cyanosis, Clubbing or edema, No new Rash or bruise     I&Os 1690/1256 Foley - yes Trach #4  Data Review   CBC  Recent Labs Lab 07/25/13 0340 07/29/13 1140  WBC 10.5 12.6*  HGB 10.2* 10.8*  HCT 33.8* 35.2*  PLT 484* 390  MCV 92.9 93.4  MCH 28.0 28.6  MCHC 30.2 30.7  RDW 17.6* 17.1*  LYMPHSABS 2.5 2.8  MONOABS 1.4* 1.7*  EOSABS 0.3 0.1  BASOSABS 0.0 0.0    Chemistries   Recent Labs Lab 07/25/13 0340 07/29/13 1140  NA 141 145  K 4.4 4.0  CL 103 103  CO2 29 30  GLUCOSE 157* 199*  BUN 41* 46*  CREATININE 1.01 1.13*  CALCIUM 11.8* 12.6*  MG  --  2.1  AST  --  33  ALT  --  16  ALKPHOS  --  96  BILITOT  --  0.3   ------------------------------------------------------------------------------------------------------------------ CrCl is unknown because both a height and weight (above a minimum accepted value)  are required for this calculation. ------------------------------------------------------------------------------------------------------------------ No results found for this basename: HGBA1C,  in the last 72 hours ------------------------------------------------------------------------------------------------------------------  Recent Labs  07/29/13 1140  TRIG 276*   ------------------------------------------------------------------------------------------------------------------ No results found for this basename: TSH, T4TOTAL, FREET3, T3FREE, THYROIDAB,  in the last 72 hours ------------------------------------------------------------------------------------------------------------------ No results found for this basename: VITAMINB12, FOLATE, FERRITIN, TIBC, IRON, RETICCTPCT,  in the last 72 hours  Coagulation profile No results found for this basename: INR, PROTIME,  in the last 168 hours  No results found for this basename: DDIMER,  in the last 72 hours  Cardiac Enzymes No results found for this basename: CK, CKMB, TROPONINI, MYOGLOBIN,  in the last 168 hours ------------------------------------------------------------------------------------------------------------------ No components found with this basename: POCBNP,   Micro Results No results found for this or any previous visit (from the past 240 hour(s)).     Assessment & Plan   Respiratory failure ; continue with ATC 28% Septic picture, peritonitis continue with IV antibiotics Abdominal wound; abdominal wound care to follow Chronic atrial Fib status post pacemaker, rate controlled  Acute on chronic renal failure; monitor creatinine Klebsiella UTI; treated Protein calorie malnutrition continue with TPN patient pulled the NG tube out  Anemia Diabetes mellitus type 2 on Levemir and NovoLog Hypothyroidism continue with Synthroid Anoxic encephalopathy; improving History of retroperitoneal hematoma with no signs of  acute bleed History of multidrug resistant Pseudomonas pneumonia treated with tobramycin in the past. Leukocytosis-improving PEG tube pulled out with no leakage noted   Plan  Restart Ativan but when necessary Fentanyl when necessary at night instead of morphine since morphine is making her sleepy Critical care time; 34 minutes code Status: Full     DVT Prophylaxis  Lovenox    Carron Curie M.D on 07/30/2013 at 12:03 PM

## 2013-07-31 NOTE — Progress Notes (Signed)
[facility]                                                                                              Progress note     Patient Demographics  [name], is a 80 y.o. female  FWY:[id number]  [id number]  DOB - Aug 16, 1932  Admit date - 07/08/2013  Admitting Physician Angela Morrison, MD  Outpatient Primary MD for the patient is Angela Divine, MD  LOS - 23  Chief complaint  Respiratory failure  Abdominal wound      Subjective:   [name] obtunded cannot give any history .  Objective:   Vital signs  Temperature 96.4 Heart rate he cannot Respiratory rate 18 Blood pressure 126/62 Pulse ox 98%    Exam Obtunded [place].AT,PERRAL, NG tube in place Supple Neck,No JVD, No cervical lymphadenopathy appriciated.  Trach in place Symmetrical Chest wall movement, diminished bilaterally with scattered rhonchi RRR,No Gallops,Rubs or new Murmurs, No Parasternal Heave Decreased B.Sounds, abdominal binder in place. Wound noted slight improvement No Cyanosis, Clubbing or edema, No new Rash or bruise     I&Os 1480/800 Foley - yes Trach #4  Data Review   CBC  Recent Labs Lab 07/25/13 0340 07/29/13 1140  WBC 10.5 12.6*  HGB 10.2* 10.8*  HCT 33.8* 35.2*  PLT 484* 390  MCV 92.9 93.4  MCH 28.0 28.6  MCHC 30.2 30.7  RDW 17.6* 17.1*  LYMPHSABS 2.5 2.8  MONOABS 1.4* 1.7*  EOSABS 0.3 0.1  BASOSABS 0.0 0.0    Chemistries   Recent Labs Lab 07/25/13 0340 07/29/13 1140  NA 141 145  K 4.4 4.0  CL 103 103  CO2 29 30  GLUCOSE 157* 199*  BUN 41* 46*  CREATININE 1.01 1.13*  CALCIUM 11.8* 12.6*  MG  --  2.1  AST  --  33  ALT  --  16  ALKPHOS  --  96  BILITOT  --  0.3   ------------------------------------------------------------------------------------------------------------------ CrCl is unknown because both a height and weight (above a minimum accepted  value) are required for this calculation. ------------------------------------------------------------------------------------------------------------------ No results found for this basename: HGBA1C,  in the last 72 hours ------------------------------------------------------------------------------------------------------------------  Recent Labs  07/29/13 1140  TRIG 276*   ------------------------------------------------------------------------------------------------------------------ No results found for this basename: TSH, T4TOTAL, FREET3, T3FREE, THYROIDAB,  in the last 72 hours ------------------------------------------------------------------------------------------------------------------ No results found for this basename: VITAMINB12, FOLATE, FERRITIN, TIBC, IRON, RETICCTPCT,  in the last 72 hours  Coagulation profile No results found for this basename: INR, PROTIME,  in the last 168 hours  No results found for this basename: DDIMER,  in the last 72 hours  Cardiac Enzymes No results found for this basename: CK, CKMB, TROPONINI, MYOGLOBIN,  in the last 168 hours ------------------------------------------------------------------------------------------------------------------ No components found with this basename: POCBNP,   Micro Results No results found for this or any previous visit (from the past 240 hour(s)).     Assessment & Plan   Respiratory failure ; continue with ATC 28% Septic picture, peritonitis continue with IV antibiotics Abdominal wound; abdominal wound care to follow Chronic atrial Fib status post pacemaker, rate controlled  Acute on chronic renal failure; monitor creatinine Klebsiella UTI; treated Protein calorie malnutrition continue with TPN patient pulled the NG tube out  Anemia Diabetes mellitus type 2 on Levemir and NovoLog Hypothyroidism continue with Synthroid Anoxic encephalopathy; improving History of retroperitoneal hematoma with no  signs of acute bleed History of multidrug resistant Pseudomonas pneumonia treated with tobramycin in the past. Leukocytosis-improving PEG tube pulled out with no leakage noted   Plan  Discussed the CODE STATUS with the family again, they did not change their mind. Reinsert NG tube by radiology in a.m. code Status: Full     DVT Prophylaxis  Lovenox    Carron CurieAli Cameryn Chrisley M.D on 07/31/2013 at 3:01 PM

## 2013-08-01 ENCOUNTER — Other Ambulatory Visit (HOSPITAL_COMMUNITY): Payer: Self-pay

## 2013-08-01 MED ORDER — IOHEXOL 300 MG/ML  SOLN: 50.0000 mL | Freq: Once | INTRAMUSCULAR | Status: AC | PRN

## 2013-08-04 LAB — PHOSPHORUS: Phosphorus: 3.9 mg/dL (ref 2.3–4.6)

## 2013-08-04 LAB — CBC
HCT: 35 % — ABNORMAL LOW (ref 36.0–46.0)
Hemoglobin: 10.9 g/dL — ABNORMAL LOW (ref 12.0–15.0)
MCH: 28.6 pg (ref 26.0–34.0)
MCHC: 31.1 g/dL (ref 30.0–36.0)
MCV: 91.9 fL (ref 78.0–100.0)
Platelets: 254 10*3/uL (ref 150–400)
RBC: 3.81 MIL/uL — AB (ref 3.87–5.11)
RDW: 16.9 % — AB (ref 11.5–15.5)
WBC: 16.1 10*3/uL — AB (ref 4.0–10.5)

## 2013-08-04 LAB — COMPREHENSIVE METABOLIC PANEL
ALBUMIN: 2.3 g/dL — AB (ref 3.5–5.2)
ALT: 15 U/L (ref 0–35)
AST: 27 U/L (ref 0–37)
Alkaline Phosphatase: 95 U/L (ref 39–117)
BUN: 56 mg/dL — AB (ref 6–23)
CALCIUM: 12.1 mg/dL — AB (ref 8.4–10.5)
CO2: 25 mEq/L (ref 19–32)
CREATININE: 1.44 mg/dL — AB (ref 0.50–1.10)
Chloride: 105 mEq/L (ref 96–112)
GFR calc non Af Amer: 34 mL/min — ABNORMAL LOW (ref >90)
GFR, EST AFRICAN AMERICAN: 39 mL/min — AB (ref >90)
GLUCOSE: 211 mg/dL — AB (ref 70–99)
Potassium: 3.8 mEq/L (ref 3.7–5.3)
Sodium: 145 mEq/L (ref 137–147)
TOTAL PROTEIN: 6.8 g/dL (ref 6.0–8.3)
Total Bilirubin: 0.2 mg/dL — ABNORMAL LOW (ref 0.3–1.2)

## 2013-08-04 LAB — MAGNESIUM: MAGNESIUM: 2.2 mg/dL (ref 1.5–2.5)

## 2013-08-04 LAB — DIFFERENTIAL
BASOS ABS: 0 10*3/uL (ref 0.0–0.1)
Basophils Relative: 0 % (ref 0–1)
Eosinophils Absolute: 0.2 10*3/uL (ref 0.0–0.7)
Eosinophils Relative: 1 % (ref 0–5)
LYMPHS ABS: 2.6 10*3/uL (ref 0.7–4.0)
Lymphocytes Relative: 16 % (ref 12–46)
MONO ABS: 1.9 10*3/uL — AB (ref 0.1–1.0)
MONOS PCT: 12 % (ref 3–12)
NEUTROS PCT: 71 % (ref 43–77)
Neutro Abs: 11.4 10*3/uL — ABNORMAL HIGH (ref 1.7–7.7)

## 2013-08-04 LAB — PREALBUMIN: Prealbumin: 19.5 mg/dL (ref 17.0–34.0)

## 2013-08-04 LAB — CALCIUM, IONIZED: CALCIUM ION: 1.65 mmol/L — AB (ref 1.13–1.30)

## 2013-08-04 LAB — TRIGLYCERIDES: Triglycerides: 292 mg/dL — ABNORMAL HIGH (ref <150)

## 2013-08-05 LAB — CBC WITH DIFFERENTIAL/PLATELET
Basophils Absolute: 0 10*3/uL (ref 0.0–0.1)
Basophils Relative: 0 % (ref 0–1)
Eosinophils Absolute: 0.2 10*3/uL (ref 0.0–0.7)
Eosinophils Relative: 1 % (ref 0–5)
HCT: 38.1 % (ref 36.0–46.0)
HEMOGLOBIN: 11.7 g/dL — AB (ref 12.0–15.0)
Lymphocytes Relative: 20 % (ref 12–46)
Lymphs Abs: 3.2 10*3/uL (ref 0.7–4.0)
MCH: 28.1 pg (ref 26.0–34.0)
MCHC: 30.7 g/dL (ref 30.0–36.0)
MCV: 91.6 fL (ref 78.0–100.0)
MONO ABS: 1.9 10*3/uL — AB (ref 0.1–1.0)
MONOS PCT: 12 % (ref 3–12)
NEUTROS PCT: 67 % (ref 43–77)
Neutro Abs: 10.8 10*3/uL — ABNORMAL HIGH (ref 1.7–7.7)
PLATELETS: 253 10*3/uL (ref 150–400)
RBC: 4.16 MIL/uL (ref 3.87–5.11)
RDW: 16.7 % — ABNORMAL HIGH (ref 11.5–15.5)
WBC: 16.1 10*3/uL — AB (ref 4.0–10.5)

## 2013-08-05 LAB — HEPATIC FUNCTION PANEL
ALT: 16 U/L (ref 0–35)
AST: 29 U/L (ref 0–37)
Albumin: 2.4 g/dL — ABNORMAL LOW (ref 3.5–5.2)
Alkaline Phosphatase: 100 U/L (ref 39–117)
BILIRUBIN TOTAL: 0.3 mg/dL (ref 0.3–1.2)
Total Protein: 7.4 g/dL (ref 6.0–8.3)

## 2013-08-05 LAB — BASIC METABOLIC PANEL
BUN: 56 mg/dL — AB (ref 6–23)
CO2: 27 mEq/L (ref 19–32)
Calcium: 12.1 mg/dL — ABNORMAL HIGH (ref 8.4–10.5)
Chloride: 103 mEq/L (ref 96–112)
Creatinine, Ser: 1.47 mg/dL — ABNORMAL HIGH (ref 0.50–1.10)
GFR, EST AFRICAN AMERICAN: 38 mL/min — AB (ref >90)
GFR, EST NON AFRICAN AMERICAN: 33 mL/min — AB (ref >90)
Glucose, Bld: 205 mg/dL — ABNORMAL HIGH (ref 70–99)
Potassium: 4.1 mEq/L (ref 3.7–5.3)
Sodium: 143 mEq/L (ref 137–147)

## 2013-08-05 LAB — TSH: TSH: 1.8 u[IU]/mL (ref 0.350–4.500)

## 2013-08-05 LAB — T4, FREE: FREE T4: 1.9 ng/dL — AB (ref 0.80–1.80)

## 2013-08-06 ENCOUNTER — Other Ambulatory Visit (HOSPITAL_COMMUNITY): Payer: Self-pay

## 2013-08-06 ENCOUNTER — Encounter: Payer: Self-pay | Admitting: Radiology

## 2013-08-07 LAB — BASIC METABOLIC PANEL
BUN: 61 mg/dL — ABNORMAL HIGH (ref 6–23)
CALCIUM: 11.2 mg/dL — AB (ref 8.4–10.5)
CO2: 27 mEq/L (ref 19–32)
Chloride: 107 mEq/L (ref 96–112)
Creatinine, Ser: 1.56 mg/dL — ABNORMAL HIGH (ref 0.50–1.10)
GFR calc Af Amer: 35 mL/min — ABNORMAL LOW (ref >90)
GFR, EST NON AFRICAN AMERICAN: 30 mL/min — AB (ref >90)
GLUCOSE: 236 mg/dL — AB (ref 70–99)
Potassium: 4.1 mEq/L (ref 3.7–5.3)
Sodium: 146 mEq/L (ref 137–147)

## 2013-08-08 LAB — BASIC METABOLIC PANEL
BUN: 50 mg/dL — AB (ref 6–23)
CO2: 27 mEq/L (ref 19–32)
Calcium: 10.4 mg/dL (ref 8.4–10.5)
Chloride: 107 mEq/L (ref 96–112)
Creatinine, Ser: 1.33 mg/dL — ABNORMAL HIGH (ref 0.50–1.10)
GFR calc Af Amer: 43 mL/min — ABNORMAL LOW (ref >90)
GFR, EST NON AFRICAN AMERICAN: 37 mL/min — AB (ref >90)
GLUCOSE: 188 mg/dL — AB (ref 70–99)
Potassium: 3.8 mEq/L (ref 3.7–5.3)
Sodium: 148 mEq/L — ABNORMAL HIGH (ref 137–147)

## 2013-08-08 LAB — VITAMIN D 25 HYDROXY (VIT D DEFICIENCY, FRACTURES): VIT D 25 HYDROXY: 47 ng/mL (ref 30–89)

## 2013-08-08 LAB — PTH, INTACT AND CALCIUM
CALCIUM TOTAL (PTH): 10.5 mg/dL (ref 8.4–10.5)
PTH: 20.1 pg/mL (ref 14.0–72.0)

## 2013-08-08 NOTE — Progress Notes (Signed)
[facility]                                                                                              Progress note     Patient Demographics  [name], is a 80 y.o. female  WUJ:[id number]  [id number]  DOB - 07/28/1932  Admit date - 07/08/2013  Admitting Physician Elnora MorrisonAhmad B Barakat, MD  Outpatient Primary MD for the patient is Astrid DivineGRIFFIN,ELAINE COLLINS, MD  LOS - 31  Chief complaint  Respiratory failure  Abdominal wound      Subjective:   Angela NorrisShirley Fitzgerald obtunded cannot give any history .  Objective:   Vital signs  Temperature 98.0 Heart rate 77 Respiratory rate 20 Blood pressure 155/66 Pulse ox 98%    Exam Obtunded [place].AT,PERRAL,  Supple Neck,No JVD, No cervical lymphadenopathy appriciated.  Trach in place Symmetrical Chest wall movement, diminished bilaterally with scattered rhonchi RRR,No Gallops,Rubs or new Murmurs, No Parasternal Heave Decreased B.Sounds, abdominal binder in place. Wound noted slight improvement No Cyanosis, Clubbing or edema, No new Rash or bruise     I&Os 2510/1750 Foley - yes Trach #4  Data Review   CBC  Recent Labs Lab 08/04/13 0540 08/05/13 0739  WBC 16.1* 16.1*  HGB 10.9* 11.7*  HCT 35.0* 38.1  PLT 254 253  MCV 91.9 91.6  MCH 28.6 28.1  MCHC 31.1 30.7  RDW 16.9* 16.7*  LYMPHSABS 2.6 3.2  MONOABS 1.9* 1.9*  EOSABS 0.2 0.2  BASOSABS 0.0 0.0    Chemistries   Recent Labs Lab 08/04/13 0540 08/05/13 0739 08/06/13 0734 08/07/13 0347 08/08/13 0420  NA 145 143  --  146 148*  K 3.8 4.1  --  4.1 3.8  CL 105 103  --  107 107  CO2 25 27  --  27 27  GLUCOSE 211* 205*  --  236* 188*  BUN 56* 56*  --  61* 50*  CREATININE 1.44* 1.47*  --  1.56* 1.33*  CALCIUM 12.1* 12.1* 10.5 11.2* 10.4  MG 2.2  --   --   --   --   AST 27 29  --   --   --   ALT 15 16  --   --   --   ALKPHOS 95 100  --   --   --   BILITOT 0.2* 0.3  --   --    --    ------------------------------------------------------------------------------------------------------------------ CrCl is unknown because both a height and weight (above a minimum accepted value) are required for this calculation. ------------------------------------------------------------------------------------------------------------------ No results found for this basename: HGBA1C,  in the last 72 hours ------------------------------------------------------------------------------------------------------------------ No results found for this basename: CHOL, HDL, LDLCALC, TRIG, CHOLHDL, LDLDIRECT,  in the last 72 hours ------------------------------------------------------------------------------------------------------------------ No results found for this basename: TSH, T4TOTAL, FREET3, T3FREE, THYROIDAB,  in the last 72 hours ------------------------------------------------------------------------------------------------------------------ No results found for this basename: VITAMINB12, FOLATE, FERRITIN, TIBC, IRON, RETICCTPCT,  in the last 72 hours  Coagulation profile No results found for this basename: INR, PROTIME,  in the last 168 hours  No results found for this basename: DDIMER,  in the last 72  hours  Cardiac Enzymes No results found for this basename: CK, CKMB, TROPONINI, MYOGLOBIN,  in the last 168 hours ------------------------------------------------------------------------------------------------------------------ No components found with this basename: POCBNP,   Micro Results No results found for this or any previous visit (from the past 240 hour(s)).     Assessment & Plan   Respiratory failure ; continue with ATC 28% Septic picture, peritonitis continue with IV antibiotics Abdominal wound; abdominal wound care to follow Chronic atrial Fib status post pacemaker, rate controlled Acute on chronic renal failure; monitor creatinine Klebsiella UTI;  treated Protein calorie malnutrition continue with TPN patient pulled the NG tube out , family refused NG tube replacement Anemia Diabetes mellitus type 2 on Levemir and NovoLog Hypothyroidism continue with Synthroid Anoxic encephalopathy; improving History of retroperitoneal hematoma with no signs of acute bleed History of multidrug resistant Pseudomonas pneumonia treated with tobramycin in the past. Leukocytosis- PEG tube pulled out with no leakage noted Hypernatremia start D5 water and follow sodium in a.m. Hypercalcemia status post calcitonin, improving  Plan  Repeat labs in a.m. DC Zyprexa Haldol IM when necessary Discussed with family earlier; there is nothing else we can offer the patient than what has been already done. Family is still insisting on full code.  code Status: Full     DVT Prophylaxis  Lovenox    Carron Curie M.D on 08/08/2013 at 3:00 PM

## 2013-08-09 ENCOUNTER — Other Ambulatory Visit (HOSPITAL_COMMUNITY): Payer: Self-pay

## 2013-08-09 ENCOUNTER — Other Ambulatory Visit (HOSPITAL_COMMUNITY): Payer: Medicare Other

## 2013-08-09 LAB — CBC
HCT: 33.7 % — ABNORMAL LOW (ref 36.0–46.0)
Hemoglobin: 10.3 g/dL — ABNORMAL LOW (ref 12.0–15.0)
MCH: 28.2 pg (ref 26.0–34.0)
MCHC: 30.6 g/dL (ref 30.0–36.0)
MCV: 92.3 fL (ref 78.0–100.0)
Platelets: 325 10*3/uL (ref 150–400)
RBC: 3.65 MIL/uL — ABNORMAL LOW (ref 3.87–5.11)
RDW: 16.7 % — AB (ref 11.5–15.5)
WBC: 19.3 10*3/uL — ABNORMAL HIGH (ref 4.0–10.5)

## 2013-08-09 LAB — BASIC METABOLIC PANEL
BUN: 46 mg/dL — ABNORMAL HIGH (ref 6–23)
CALCIUM: 10.2 mg/dL (ref 8.4–10.5)
CO2: 29 mEq/L (ref 19–32)
Chloride: 102 mEq/L (ref 96–112)
Creatinine, Ser: 1.28 mg/dL — ABNORMAL HIGH (ref 0.50–1.10)
GFR, EST AFRICAN AMERICAN: 45 mL/min — AB (ref >90)
GFR, EST NON AFRICAN AMERICAN: 39 mL/min — AB (ref >90)
Glucose, Bld: 226 mg/dL — ABNORMAL HIGH (ref 70–99)
Potassium: 3.4 mEq/L — ABNORMAL LOW (ref 3.7–5.3)
SODIUM: 143 meq/L (ref 137–147)

## 2013-08-09 LAB — PROTEIN ELECTROPHORESIS, SERUM
Albumin ELP: 43.9 % — ABNORMAL LOW (ref 55.8–66.1)
Alpha-1-Globulin: 6 % — ABNORMAL HIGH (ref 2.9–4.9)
Alpha-2-Globulin: 18.1 % — ABNORMAL HIGH (ref 7.1–11.8)
Beta 2: 5.2 % (ref 3.2–6.5)
Beta Globulin: 5.5 % (ref 4.7–7.2)
GAMMA GLOBULIN: 21.3 % — AB (ref 11.1–18.8)
M-SPIKE, %: 0.3 g/dL
Total Protein ELP: 7.3 g/dL (ref 6.0–8.3)

## 2013-08-09 LAB — CALCIUM, IONIZED: CALCIUM ION: 1.41 mmol/L — AB (ref 1.13–1.30)

## 2013-08-09 NOTE — Progress Notes (Signed)
[facility]                                                                                              Progress note     Patient Demographics  [name], is a 80 y.o. female  [id number]:[id number]  [id number]:[id number]  DOB - 1932-06-28  Admit date - 07/08/2013  Admitting Physician Elnora Morrison, MD  Outpatient Primary MD for the patient is [name], MD  LOS - 32  Chief complaint  Respiratory failure  Abdominal wound      Subjective:   [name] mode alert and awake but still confused .  Objective:   Vital signs  Temperature 98.8 Heart rate 80 Respiratory rate 18 Blood pressure 153/68 Pulse ox 98%    Exam Alert and awake, confused [place].AT,PERRAL,  Supple Neck,No JVD, No cervical lymphadenopathy appriciated.  Trach in place Symmetrical Chest wall movement, diminished bilaterally with scattered rhonchi RRR,No Gallops,Rubs or new Murmurs, No Parasternal Heave Decreased B.Sounds, abdominal binder in place. Wound noted slight improvement No Cyanosis, Clubbing or edema, No new Rash or bruise  Left-sided paralysis noted    I&Os 2510/1750 Foley - yes Trach #4  Data Review   CBC  Recent Labs Lab 08/04/13 0540 08/05/13 0739 08/09/13 0525  WBC 16.1* 16.1* 19.3*  HGB 10.9* 11.7* 10.3*  HCT 35.0* 38.1 33.7*  PLT 254 253 325  MCV 91.9 91.6 92.3  MCH 28.6 28.1 28.2  MCHC 31.1 30.7 30.6  RDW 16.9* 16.7* 16.7*  LYMPHSABS 2.6 3.2  --   MONOABS 1.9* 1.9*  --   EOSABS 0.2 0.2  --   BASOSABS 0.0 0.0  --     Chemistries   Recent Labs Lab 08/04/13 0540 08/05/13 0739 08/06/13 0734 08/07/13 0347 08/08/13 0420 08/09/13 0525  NA 145 143  --  146 148* 143  K 3.8 4.1  --  4.1 3.8 3.4*  CL 105 103  --  107 107 102  CO2 25 27  --  27 27 29   GLUCOSE 211* 205*  --  236* 188* 226*  BUN 56* 56*  --  61* 50* 46*  CREATININE 1.44* 1.47*  --  1.56* 1.33* 1.28*   CALCIUM 12.1* 12.1* 10.5 11.2* 10.4 10.2  MG 2.2  --   --   --   --   --   AST 27 29  --   --   --   --   ALT 15 16  --   --   --   --   ALKPHOS 95 100  --   --   --   --   BILITOT 0.2* 0.3  --   --   --   --    ------------------------------------------------------------------------------------------------------------------ CrCl is unknown because both a height and weight (above a minimum accepted value) are required for this calculation. ------------------------------------------------------------------------------------------------------------------ No results found for this basename: HGBA1C,  in the last 72 hours ------------------------------------------------------------------------------------------------------------------ No results found for this basename: CHOL, HDL, LDLCALC, TRIG, CHOLHDL, LDLDIRECT,  in the last 72 hours ------------------------------------------------------------------------------------------------------------------ No results found for this basename: TSH, T4TOTAL, FREET3, T3FREE, THYROIDAB,  in the last 72 hours ------------------------------------------------------------------------------------------------------------------ No results found for this basename: VITAMINB12, FOLATE, FERRITIN, TIBC, IRON, RETICCTPCT,  in the last 72 hours  Coagulation profile No results found for this basename: INR, PROTIME,  in the last 168 hours  No results found for this basename: DDIMER,  in the last 72 hours  Cardiac Enzymes No results found for this basename: CK, CKMB, TROPONINI, MYOGLOBIN,  in the last 168 hours ------------------------------------------------------------------------------------------------------------------ No components found with this basename: POCBNP,   Micro Results No results found for this or any previous visit (from the past 240 hour(s)).   Repeat CT of the head was negative for acute events  Assessment & Plan   Respiratory failure ;  continue with ATC 28% Septic picture, peritonitis continue with IV antibiotics Abdominal wound; abdominal wound care to follow Chronic atrial Fib status post pacemaker, rate controlled Acute on chronic renal failure; monitor creatinine Klebsiella UTI; treated Protein calorie malnutrition continue with TPN patient pulled the NG tube out , family refused NG tube replacement Anemia Diabetes mellitus type 2 on Levemir and NovoLog Hypothyroidism continue with Synthroid Anoxic encephalopathy; improving History of retroperitoneal hematoma with no signs of acute bleed History of multidrug resistant Pseudomonas pneumonia treated with tobramycin in the past. Leukocytosis-check chest x-ray stat ,  PEG tube pulled out with no leakage noted Hypernatremia start D5 water and follow sodium in a.m. Hypercalcemia status post calcitonin, improving Left-sided paralysis, CT of the head  was negative MRI could not be done since the patient has a pacemaker.  Plan  Check chest x-ray stat DC D 5 water Start Flagyl IV Critical care time 39 minutes  code Status: Full     DVT Prophylaxis  Lovenox    Carron CurieAli Bobbette Eakes M.D on 08/09/2013 at 6:35 PM

## 2013-08-10 LAB — BASIC METABOLIC PANEL
BUN: 45 mg/dL — ABNORMAL HIGH (ref 6–23)
CHLORIDE: 103 meq/L (ref 96–112)
CO2: 27 mEq/L (ref 19–32)
Calcium: 10.1 mg/dL (ref 8.4–10.5)
Creatinine, Ser: 1.22 mg/dL — ABNORMAL HIGH (ref 0.50–1.10)
GFR calc non Af Amer: 41 mL/min — ABNORMAL LOW (ref >90)
GFR, EST AFRICAN AMERICAN: 48 mL/min — AB (ref >90)
Glucose, Bld: 223 mg/dL — ABNORMAL HIGH (ref 70–99)
POTASSIUM: 3.5 meq/L — AB (ref 3.7–5.3)
SODIUM: 143 meq/L (ref 137–147)

## 2013-08-10 LAB — CBC
HCT: 32.9 % — ABNORMAL LOW (ref 36.0–46.0)
Hemoglobin: 9.9 g/dL — ABNORMAL LOW (ref 12.0–15.0)
MCH: 27.2 pg (ref 26.0–34.0)
MCHC: 30.1 g/dL (ref 30.0–36.0)
MCV: 90.4 fL (ref 78.0–100.0)
PLATELETS: 326 10*3/uL (ref 150–400)
RBC: 3.64 MIL/uL — ABNORMAL LOW (ref 3.87–5.11)
RDW: 16.4 % — AB (ref 11.5–15.5)
WBC: 15.4 10*3/uL — AB (ref 4.0–10.5)

## 2013-08-10 LAB — PROCALCITONIN: PROCALCITONIN: 0.3 ng/mL

## 2013-08-10 NOTE — Progress Notes (Signed)
[facility]                                                                                              Progress note     Patient Demographics  [name], is a 80 y.o. female  ZOX:[id number]  [id number]  DOB - 23-Jan-1933  Admit date - 07/08/2013  Admitting Physician Elnora Morrison, MD  Outpatient Primary MD for the patient is [name], MD  LOS - 33  Chief complaint  Respiratory failure  Abdominal wound      Subjective:   [name] mode alert and awake but still confused .  Objective:   Vital signs  Temperature 98.1 Heart rate 79 Respiratory rate 20 Blood pressure 144/69 Pulse ox 99 %    Exam Alert and awake, confused [name].AT,PERRAL,  Supple Neck,No JVD, No cervical lymphadenopathy appriciated.  Trach in place Symmetrical Chest wall movement, diminished bilaterally with scattered rhonchi RRR,No Gallops,Rubs or new Murmurs, No Parasternal Heave Decreased B.Sounds, abdominal binder in place. Wound noted slight improvement No Cyanosis, Clubbing or edema, No new Rash or bruise  Left-sided paralysis noted    I&Os 2460/1550 Foley - yes Trach #4  Data Review   CBC  Recent Labs Lab 08/04/13 0540 08/05/13 0739 08/09/13 0525 08/10/13 0900  WBC 16.1* 16.1* 19.3* 15.4*  HGB 10.9* 11.7* 10.3* 9.9*  HCT 35.0* 38.1 33.7* 32.9*  PLT 254 253 325 326  MCV 91.9 91.6 92.3 90.4  MCH 28.6 28.1 28.2 27.2  MCHC 31.1 30.7 30.6 30.1  RDW 16.9* 16.7* 16.7* 16.4*  LYMPHSABS 2.6 3.2  --   --   MONOABS 1.9* 1.9*  --   --   EOSABS 0.2 0.2  --   --   BASOSABS 0.0 0.0  --   --     Chemistries   Recent Labs Lab 08/04/13 0540 08/05/13 0739 08/06/13 0734 08/07/13 0347 08/08/13 0420 08/09/13 0525 08/10/13 0900  NA 145 143  --  146 148* 143 143  K 3.8 4.1  --  4.1 3.8 3.4* 3.5*  CL 105 103  --  107 107 102 103  CO2 25 27  --  27 27 29 27   GLUCOSE 211*  205*  --  236* 188* 226* 223*  BUN 56* 56*  --  61* 50* 46* 45*  CREATININE 1.44* 1.47*  --  1.56* 1.33* 1.28* 1.22*  CALCIUM 12.1* 12.1* 10.5 11.2* 10.4 10.2 10.1  MG 2.2  --   --   --   --   --   --   AST 27 29  --   --   --   --   --   ALT 15 16  --   --   --   --   --   ALKPHOS 95 100  --   --   --   --   --   BILITOT 0.2* 0.3  --   --   --   --   --    ------------------------------------------------------------------------------------------------------------------ CrCl is unknown because both a height and weight (above a minimum accepted value) are required  for this calculation. ------------------------------------------------------------------------------------------------------------------ No results found for this basename: HGBA1C,  in the last 72 hours ------------------------------------------------------------------------------------------------------------------ No results found for this basename: CHOL, HDL, LDLCALC, TRIG, CHOLHDL, LDLDIRECT,  in the last 72 hours ------------------------------------------------------------------------------------------------------------------ No results found for this basename: TSH, T4TOTAL, FREET3, T3FREE, THYROIDAB,  in the last 72 hours ------------------------------------------------------------------------------------------------------------------ No results found for this basename: VITAMINB12, FOLATE, FERRITIN, TIBC, IRON, RETICCTPCT,  in the last 72 hours  Coagulation profile No results found for this basename: INR, PROTIME,  in the last 168 hours  No results found for this basename: DDIMER,  in the last 72 hours  Cardiac Enzymes No results found for this basename: CK, CKMB, TROPONINI, MYOGLOBIN,  in the last 168 hours ------------------------------------------------------------------------------------------------------------------ No components found with this basename: POCBNP,   Micro Results No results found for this or any  previous visit (from the past 240 hour(s)).   Repeat CT of the head was negative for acute events Chest x-ray shows left-sided pneumonia Assessment & Plan   Respiratory failure ; continue with ATC 28% Septic picture, peritonitis continue with IV antibiotics Abdominal wound; abdominal wound care to follow Chronic atrial Fib status post pacemaker, rate controlled Acute on chronic renal failure; monitor creatinine Klebsiella UTI; treated Protein calorie malnutrition continue with TPN patient pulled the NG tube out , family refused NG tube replacement Anemia Diabetes mellitus type 2 on Levemir and NovoLog Hypothyroidism continue with Synthroid Anoxic encephalopathy; improving History of retroperitoneal hematoma with no signs of acute bleed History of multidrug resistant Pseudomonas pneumonia treated with tobramycin in the past. PEG tube pulled out with no leakage noted Hypernatremia start D5 water and follow sodium in a.m. Hypercalcemia status post calcitonin, improving Left-sided paralysis, CT of the head  was negative MRI could not be done since the patient has a pacemaker. Patient       might have had a CVA. I cannot tell when this has happened since patient was encephalopathic for quite some time Left-sided pneumonia continue with IV antibiotics  Plan  Continue IV antibiotics Check CBC BMP in a.m.   code Status: Full     DVT Prophylaxis  Lovenox    Carron CurieHijazi, Rifky Lapre M.D on 08/10/2013 at 3:19 PM

## 2013-08-11 LAB — URINE MICROSCOPIC-ADD ON

## 2013-08-11 LAB — DIFFERENTIAL
Basophils Absolute: 0 10*3/uL (ref 0.0–0.1)
Basophils Relative: 0 % (ref 0–1)
EOS ABS: 0.3 10*3/uL (ref 0.0–0.7)
Eosinophils Relative: 2 % (ref 0–5)
LYMPHS ABS: 2.7 10*3/uL (ref 0.7–4.0)
LYMPHS PCT: 15 % (ref 12–46)
Monocytes Absolute: 1.8 10*3/uL — ABNORMAL HIGH (ref 0.1–1.0)
Monocytes Relative: 10 % (ref 3–12)
Neutro Abs: 13.5 10*3/uL — ABNORMAL HIGH (ref 1.7–7.7)
Neutrophils Relative %: 73 % (ref 43–77)

## 2013-08-11 LAB — COMPREHENSIVE METABOLIC PANEL
ALT: 26 U/L (ref 0–35)
AST: 42 U/L — ABNORMAL HIGH (ref 0–37)
Albumin: 2.6 g/dL — ABNORMAL LOW (ref 3.5–5.2)
Alkaline Phosphatase: 96 U/L (ref 39–117)
BUN: 47 mg/dL — ABNORMAL HIGH (ref 6–23)
CALCIUM: 10.3 mg/dL (ref 8.4–10.5)
CO2: 24 meq/L (ref 19–32)
Chloride: 104 mEq/L (ref 96–112)
Creatinine, Ser: 1.25 mg/dL — ABNORMAL HIGH (ref 0.50–1.10)
GFR, EST AFRICAN AMERICAN: 46 mL/min — AB (ref >90)
GFR, EST NON AFRICAN AMERICAN: 40 mL/min — AB (ref >90)
GLUCOSE: 197 mg/dL — AB (ref 70–99)
Potassium: 3.4 mEq/L — ABNORMAL LOW (ref 3.7–5.3)
Sodium: 143 mEq/L (ref 137–147)
Total Bilirubin: 0.2 mg/dL — ABNORMAL LOW (ref 0.3–1.2)
Total Protein: 7 g/dL (ref 6.0–8.3)

## 2013-08-11 LAB — CBC
HCT: 36.5 % (ref 36.0–46.0)
HEMOGLOBIN: 11 g/dL — AB (ref 12.0–15.0)
MCH: 27.6 pg (ref 26.0–34.0)
MCHC: 30.1 g/dL (ref 30.0–36.0)
MCV: 91.5 fL (ref 78.0–100.0)
Platelets: 303 10*3/uL (ref 150–400)
RBC: 3.99 MIL/uL (ref 3.87–5.11)
RDW: 16.2 % — ABNORMAL HIGH (ref 11.5–15.5)
WBC: 18.3 10*3/uL — ABNORMAL HIGH (ref 4.0–10.5)

## 2013-08-11 LAB — URINALYSIS, ROUTINE W REFLEX MICROSCOPIC
Bilirubin Urine: NEGATIVE
GLUCOSE, UA: NEGATIVE mg/dL
Ketones, ur: NEGATIVE mg/dL
Nitrite: POSITIVE — AB
Protein, ur: 30 mg/dL — AB
Specific Gravity, Urine: 1.016 (ref 1.005–1.030)
UROBILINOGEN UA: 0.2 mg/dL (ref 0.0–1.0)
pH: 5.5 (ref 5.0–8.0)

## 2013-08-11 LAB — MAGNESIUM: Magnesium: 1.9 mg/dL (ref 1.5–2.5)

## 2013-08-11 LAB — PHOSPHORUS: Phosphorus: 4.1 mg/dL (ref 2.3–4.6)

## 2013-08-11 NOTE — Progress Notes (Signed)
[facility]                                                                                              Progress note     Patient Demographics  [name], is a 80 y.o. female  HQI:[id number]  [id number]  DOB - 04-02-1932  Admit date - 07/08/2013  Admitting Physician Elnora Morrison, MD  Outpatient Primary MD for the patient is [name], MD  LOS - 34  Chief complaint  Respiratory failure  Abdominal wound      Subjective:   [name] mode alert and awake but still confused .  Objective:   Vital signs  Temperature 98.8 Heart rate 74 Respiratory rate 15 Blood pressure 11 8/70 Pulse ox 98 %    Exam Alert and awake, confused [name].AT,PERRAL,  Supple Neck,No JVD, No cervical lymphadenopathy appriciated.  Trach in place Symmetrical Chest wall movement, diminished bilaterally with scattered rhonchi RRR,No Gallops,Rubs or new Murmurs, No Parasternal Heave Decreased B.Sounds, abdominal binder in place. Wound noted slight improvement No Cyanosis, Clubbing or edema, No new Rash or bruise  Left-sided paralysis noted    I&Os 2060/1500 Foley - yes Trach #4  Data Review   CBC  Recent Labs Lab 08/05/13 0739 08/09/13 0525 08/10/13 0900 08/11/13 0740  WBC 16.1* 19.3* 15.4* 18.3*  HGB 11.7* 10.3* 9.9* 11.0*  HCT 38.1 33.7* 32.9* 36.5  PLT 253 325 326 303  MCV 91.6 92.3 90.4 91.5  MCH 28.1 28.2 27.2 27.6  MCHC 30.7 30.6 30.1 30.1  RDW 16.7* 16.7* 16.4* 16.2*  LYMPHSABS 3.2  --   --  2.7  MONOABS 1.9*  --   --  1.8*  EOSABS 0.2  --   --  0.3  BASOSABS 0.0  --   --  0.0    Chemistries   Recent Labs Lab 08/05/13 0739  08/07/13 0347 08/08/13 0420 08/09/13 0525 08/10/13 0900 08/11/13 0740  NA 143  --  146 148* 143 143 143  K 4.1  --  4.1 3.8 3.4* 3.5* 3.4*  CL 103  --  107 107 102 103 104  CO2 27  --  27 27 29 27 24   GLUCOSE 205*  --  236*  188* 226* 223* 197*  BUN 56*  --  61* 50* 46* 45* 47*  CREATININE 1.47*  --  1.56* 1.33* 1.28* 1.22* 1.25*  CALCIUM 12.1*  < > 11.2* 10.4 10.2 10.1 10.3  MG  --   --   --   --   --   --  1.9  AST 29  --   --   --   --   --  42*  ALT 16  --   --   --   --   --  26  ALKPHOS 100  --   --   --   --   --  96  BILITOT 0.3  --   --   --   --   --  0.2*  < > = values in this interval not displayed. ------------------------------------------------------------------------------------------------------------------ CrCl is unknown because both a  height and weight (above a minimum accepted value) are required for this calculation. ------------------------------------------------------------------------------------------------------------------ No results found for this basename: HGBA1C,  in the last 72 hours ------------------------------------------------------------------------------------------------------------------ No results found for this basename: CHOL, HDL, LDLCALC, TRIG, CHOLHDL, LDLDIRECT,  in the last 72 hours ------------------------------------------------------------------------------------------------------------------ No results found for this basename: TSH, T4TOTAL, FREET3, T3FREE, THYROIDAB,  in the last 72 hours ------------------------------------------------------------------------------------------------------------------ No results found for this basename: VITAMINB12, FOLATE, FERRITIN, TIBC, IRON, RETICCTPCT,  in the last 72 hours  Coagulation profile No results found for this basename: INR, PROTIME,  in the last 168 hours  No results found for this basename: DDIMER,  in the last 72 hours  Cardiac Enzymes No results found for this basename: CK, CKMB, TROPONINI, MYOGLOBIN,  in the last 168 hours ------------------------------------------------------------------------------------------------------------------ No components found with this basename: POCBNP,   Micro Results No  results found for this or any previous visit (from the past 240 hour(s)).   Repeat CT of the head was negative for acute events Chest x-ray shows left-sided pneumonia Assessment & Plan   Respiratory failure ; continue with ATC 28% Septic picture, peritonitis off antibiotics Abdominal wound; abdominal wound care to follow, still has wound VAC  Chronic atrial Fib status post pacemaker, rate controlled Acute on chronic renal failure; monitor creatinine Klebsiella UTI; treated Protein calorie malnutrition continue with TPN patient pulled the NG tube out , family refused NG tube replacement Anemia Diabetes mellitus type 2 on Levemir and NovoLog Hypothyroidism continue with Synthroid Anoxic encephalopathy; improving History of retroperitoneal hematoma with no signs of acute bleed History of multidrug resistant Pseudomonas pneumonia treated with tobramycin in the past. PEG tube pulled out with no leakage noted with contrast Hypernatremia on D5 water  Hypercalcemia status post calcitonin, improving Left-sided paralysis, CT of the head  was negative MRI could not be done since the patient has a pacemaker. Patient       might have had a CVA. I cannot tell when this has happened since patient was encephalopathic for quite some time Left-sided pneumonia continue with IV antibiotics  Plan  CBC BMP in a.m.   code Status: Full     DVT Prophylaxis  Lovenox    Carron CurieHijazi, Cecilia Nishikawa M.D on 08/11/2013 at 3:08 PM

## 2013-08-12 LAB — CBC
HEMATOCRIT: 35.3 % — AB (ref 36.0–46.0)
Hemoglobin: 10.9 g/dL — ABNORMAL LOW (ref 12.0–15.0)
MCH: 28.4 pg (ref 26.0–34.0)
MCHC: 30.9 g/dL (ref 30.0–36.0)
MCV: 91.9 fL (ref 78.0–100.0)
Platelets: 265 10*3/uL (ref 150–400)
RBC: 3.84 MIL/uL — ABNORMAL LOW (ref 3.87–5.11)
RDW: 16.1 % — AB (ref 11.5–15.5)
WBC: 16.6 10*3/uL — ABNORMAL HIGH (ref 4.0–10.5)

## 2013-08-12 LAB — BASIC METABOLIC PANEL
BUN: 51 mg/dL — AB (ref 6–23)
CALCIUM: 10.3 mg/dL (ref 8.4–10.5)
CO2: 26 mEq/L (ref 19–32)
CREATININE: 1.24 mg/dL — AB (ref 0.50–1.10)
Chloride: 106 mEq/L (ref 96–112)
GFR calc non Af Amer: 40 mL/min — ABNORMAL LOW (ref >90)
GFR, EST AFRICAN AMERICAN: 47 mL/min — AB (ref >90)
Glucose, Bld: 157 mg/dL — ABNORMAL HIGH (ref 70–99)
POTASSIUM: 3.4 meq/L — AB (ref 3.7–5.3)
Sodium: 146 mEq/L (ref 137–147)

## 2013-08-12 NOTE — Progress Notes (Signed)
[facility]                                                                                              Progress note     Patient Demographics  [name], is a 80 y.o. female  WJX:[id number]  OZH:[id number]  DOB - 08/29/1932  Admit date - 07/08/2013  Admitting Physician Elnora MorrisonAhmad B Barakat, MD  Outpatient Primary MD for the patient is Astrid DivineGRIFFIN,ELAINE COLLINS, MD  LOS - 35  Chief complaint  Respiratory failure  Abdominal wound      Subjective:   [name] mode alert and awake but still confused .  Objective:   Vital signs  Temperature 99 Heart rate 91 Respiratory rate 18 Blood pressure 11 8/62 Pulse ox 99 %    Exam Alert and awake, confused [place].AT,PERRAL,  Supple Neck,No JVD, No cervical lymphadenopathy appriciated.  Trach in place Symmetrical Chest wall movement, diminished bilaterally with scattered rhonchi RRR,No Gallops,Rubs or new Murmurs, No Parasternal Heave Decreased B.Sounds, abdominal binder in place. Wound noted slight improvement No Cyanosis, Clubbing or edema, No new Rash or bruise  Left-sided paralysis noted    I&Os unknown Foley - yes Trach #4  Data Review   CBC  Recent Labs Lab 08/09/13 0525 08/10/13 0900 08/11/13 0740 08/12/13 0745  WBC 19.3* 15.4* 18.3* 16.6*  HGB 10.3* 9.9* 11.0* 10.9*  HCT 33.7* 32.9* 36.5 35.3*  PLT 325 326 303 265  MCV 92.3 90.4 91.5 91.9  MCH 28.2 27.2 27.6 28.4  MCHC 30.6 30.1 30.1 30.9  RDW 16.7* 16.4* 16.2* 16.1*  LYMPHSABS  --   --  2.7  --   MONOABS  --   --  1.8*  --   EOSABS  --   --  0.3  --   BASOSABS  --   --  0.0  --     Chemistries   Recent Labs Lab 08/08/13 0420 08/09/13 0525 08/10/13 0900 08/11/13 0740 08/12/13 0745  NA 148* 143 143 143 146  K 3.8 3.4* 3.5* 3.4* 3.4*  CL 107 102 103 104 106  CO2 27 29 27 24 26   GLUCOSE 188* 226* 223* 197* 157*  BUN 50* 46* 45* 47* 51*  CREATININE  1.33* 1.28* 1.22* 1.25* 1.24*  CALCIUM 10.4 10.2 10.1 10.3 10.3  MG  --   --   --  1.9  --   AST  --   --   --  42*  --   ALT  --   --   --  26  --   ALKPHOS  --   --   --  96  --   BILITOT  --   --   --  0.2*  --    ------------------------------------------------------------------------------------------------------------------ CrCl is unknown because both a height and weight (above a minimum accepted value) are required for this calculation. ------------------------------------------------------------------------------------------------------------------ No results found for this basename: HGBA1C,  in the last 72 hours ------------------------------------------------------------------------------------------------------------------ No results found for this basename: CHOL, HDL, LDLCALC, TRIG, CHOLHDL, LDLDIRECT,  in the last 72 hours ------------------------------------------------------------------------------------------------------------------ No results found for this basename: TSH, T4TOTAL, FREET3, T3FREE,  THYROIDAB,  in the last 72 hours ------------------------------------------------------------------------------------------------------------------ No results found for this basename: VITAMINB12, FOLATE, FERRITIN, TIBC, IRON, RETICCTPCT,  in the last 72 hours  Coagulation profile No results found for this basename: INR, PROTIME,  in the last 168 hours  No results found for this basename: DDIMER,  in the last 72 hours  Cardiac Enzymes No results found for this basename: CK, CKMB, TROPONINI, MYOGLOBIN,  in the last 168 hours ------------------------------------------------------------------------------------------------------------------ No components found with this basename: POCBNP,   Micro Results Recent Results (from the past 240 hour(s))  CULTURE, BLOOD (ROUTINE X 2)     Status: None   Collection Time    08/11/13 12:10 PM      Result Value Ref Range Status   Specimen  Description BLOOD LEFT HAND   Final   Special Requests BOTTLES DRAWN AEROBIC ONLY 2CC   Final   Culture  Setup Time     Final   Value: 08/11/2013 17:55     Performed at [facility]   Culture     Final   Value:        BLOOD CULTURE RECEIVED NO GROWTH TO DATE CULTURE WILL BE HELD FOR 5 DAYS BEFORE ISSUING A FINAL NEGATIVE REPORT     Performed at [facility]   Report Status PENDING   Incomplete  CULTURE, BLOOD (ROUTINE X 2)     Status: None   Collection Time    08/11/13 12:25 PM      Result Value Ref Range Status   Specimen Description BLOOD LEFT ANTECUBITAL   Final   Special Requests BOTTLES DRAWN AEROBIC ONLY 3CC   Final   Culture  Setup Time     Final   Value: 08/11/2013 17:51     Performed at [facility]   Culture     Final   Value:        BLOOD CULTURE RECEIVED NO GROWTH TO DATE CULTURE WILL BE HELD FOR 5 DAYS BEFORE ISSUING A FINAL NEGATIVE REPORT     Performed at [facility]   Report Status PENDING   Incomplete     Repeat CT of the head was negative for acute events Chest x-ray shows left-sided pneumonia Assessment & Plan   Respiratory failure ; continue with ATC 28% Septic picture, peritonitis off antibiotics Abdominal wound; abdominal wound care to follow, still has wound VAC  Chronic atrial Fib status post pacemaker, rate controlled Acute on chronic renal failure; monitor creatinine Klebsiella UTI; treated Protein calorie malnutrition continue with TPN patient pulled the NG tube out , family refused NG tube replacement Anemia Diabetes mellitus type 2 on Levemir and NovoLog Hypothyroidism continue with Synthroid Anoxic encephalopathy; improving History of retroperitoneal hematoma with no signs of acute bleed History of multidrug resistant Pseudomonas pneumonia treated with tobramycin in the past. PEG tube pulled out with no leakage noted with contrast Hypernatremia on D5 water  Hypercalcemia status post calcitonin,  improving Left-sided paralysis, CT of the head  was negative MRI could not be done since the patient has a pacemaker. Patient       might have had a CVA. I cannot tell when this has happened since patient was encephalopathic for quite some time Left-sided pneumonia continue with IV antibiotics UTI/yeast   Plan  Diflucan Continue cefepime    code Status: Full     DVT Prophylaxis  Lovenox    Carron Curie M.D on 08/12/2013 at 2:27 PM

## 2013-08-13 LAB — CULTURE, BLOOD (ROUTINE X 2)

## 2013-08-13 NOTE — Progress Notes (Signed)
[facility]                                                                                              Progress note     Patient Demographics  [name], is a 80 y.o. female  FHL:[id number]  [id number]:[id number]  DOB - February 19, 1933  Admit date - 07/08/2013  Admitting Physician Angela Morrison, MD  Outpatient Primary MD for the patient is Angela Divine, MD  LOS - 36  Chief complaint  Respiratory failure  Abdominal wound      Subjective:   [name] mode alert and awake but still confused .  Objective:   Vital signs  Temperature 99 Heart rate 91 Respiratory rate 18 Blood pressure 11 8/62 Pulse ox 99 %    Exam Alert and awake, confused [name].AT,PERRAL,  Supple Neck,No JVD, No cervical lymphadenopathy appriciated.  Trach in place Symmetrical Chest wall movement, diminished bilaterally with scattered rhonchi RRR,No Gallops,Rubs or new Murmurs, No Parasternal Heave Decreased B.Sounds, abdominal binder in place. Wound noted slight improvement No Cyanosis, Clubbing or edema, No new Rash or bruise  Left-sided paralysis noted    I&Os unknown Foley - yes Trach #4  Data Review   CBC  Recent Labs Lab 08/09/13 0525 08/10/13 0900 08/11/13 0740 08/12/13 0745  WBC 19.3* 15.4* 18.3* 16.6*  HGB 10.3* 9.9* 11.0* 10.9*  HCT 33.7* 32.9* 36.5 35.3*  PLT 325 326 303 265  MCV 92.3 90.4 91.5 91.9  MCH 28.2 27.2 27.6 28.4  MCHC 30.6 30.1 30.1 30.9  RDW 16.7* 16.4* 16.2* 16.1*  LYMPHSABS  --   --  2.7  --   MONOABS  --   --  1.8*  --   EOSABS  --   --  0.3  --   BASOSABS  --   --  0.0  --     Chemistries   Recent Labs Lab 08/08/13 0420 08/09/13 0525 08/10/13 0900 08/11/13 0740 08/12/13 0745  NA 148* 143 143 143 146  K 3.8 3.4* 3.5* 3.4* 3.4*  CL 107 102 103 104 106  CO2 27 29 27 24 26   GLUCOSE 188* 226* 223* 197* 157*  BUN 50* 46* 45* 47* 51*  CREATININE  1.33* 1.28* 1.22* 1.25* 1.24*  CALCIUM 10.4 10.2 10.1 10.3 10.3  MG  --   --   --  1.9  --   AST  --   --   --  42*  --   ALT  --   --   --  26  --   ALKPHOS  --   --   --  96  --   BILITOT  --   --   --  0.2*  --    ------------------------------------------------------------------------------------------------------------------ CrCl is unknown because both a height and weight (above a minimum accepted value) are required for this calculation. ------------------------------------------------------------------------------------------------------------------ No results found for this basename: HGBA1C,  in the last 72 hours ------------------------------------------------------------------------------------------------------------------ No results found for this basename: CHOL, HDL, LDLCALC, TRIG, CHOLHDL, LDLDIRECT,  in the last 72 hours ------------------------------------------------------------------------------------------------------------------ No results found for this basename: TSH, T4TOTAL, FREET3, T3FREE,  THYROIDAB,  in the last 72 hours ------------------------------------------------------------------------------------------------------------------ No results found for this basename: VITAMINB12, FOLATE, FERRITIN, TIBC, IRON, RETICCTPCT,  in the last 72 hours  Coagulation profile No results found for this basename: INR, PROTIME,  in the last 168 hours  No results found for this basename: DDIMER,  in the last 72 hours  Cardiac Enzymes No results found for this basename: CK, CKMB, TROPONINI, MYOGLOBIN,  in the last 168 hours ------------------------------------------------------------------------------------------------------------------ No components found with this basename: POCBNP,   Micro Results Recent Results (from the past 240 hour(s))  CULTURE, BLOOD (ROUTINE X 2)     Status: None   Collection Time    08/11/13 12:10 PM      Result Value Ref Range Status   Specimen  Description BLOOD LEFT HAND   Final   Special Requests BOTTLES DRAWN AEROBIC ONLY 2CC   Final   Culture  Setup Time     Final   Value: 08/11/2013 17:55     Performed at [facility]   Culture     Final   Value: STAPHYLOCOCCUS SPECIES (COAGULASE NEGATIVE)     Note: THE SIGNIFICANCE OF ISOLATING THIS ORGANISM FROM A SINGLE SET OF BLOOD CULTURES WHEN MULTIPLE SETS ARE DRAWN IS UNCERTAIN. PLEASE NOTIFY THE MICROBIOLOGY DEPARTMENT WITHIN ONE WEEK IF SPECIATION AND SENSITIVITIES ARE REQUIRED.     Note: Gram Stain Report Called to,Read Back By and Verified With: Delynn Flavin RN EDMOJ [phone]     Performed at [facility]   Report Status 08/13/2013 FINAL   Final  CULTURE, BLOOD (ROUTINE X 2)     Status: None   Collection Time    08/11/13 12:25 PM      Result Value Ref Range Status   Specimen Description BLOOD LEFT ANTECUBITAL   Final   Special Requests BOTTLES DRAWN AEROBIC ONLY 3CC   Final   Culture  Setup Time     Final   Value: 08/11/2013 17:51     Performed at [facility]   Culture     Final   Value:        BLOOD CULTURE RECEIVED NO GROWTH TO DATE CULTURE WILL BE HELD FOR 5 DAYS BEFORE ISSUING A FINAL NEGATIVE REPORT     Performed at [facility]   Report Status PENDING   Incomplete     Repeat CT of the head was negative for acute events Chest x-ray shows left-sided pneumonia Assessment & Plan   Respiratory failure ; continue with ATC 28% Septic picture, peritonitis off antibiotics Abdominal wound; abdominal wound care to follow, still has wound VAC  Chronic atrial Fib status post pacemaker, rate controlled Acute on chronic renal failure; monitor creatinine Klebsiella UTI; treated Protein calorie malnutrition continue with TPN patient pulled the NG tube out , family refused NG tube replacement Anemia Diabetes mellitus type 2 on Levemir and NovoLog Hypothyroidism continue with Synthroid Anoxic encephalopathy; improving History of  retroperitoneal hematoma with no signs of acute bleed History of multidrug resistant Pseudomonas pneumonia treated with tobramycin in the past. PEG tube pulled out with no leakage noted with contrast Hypernatremia on D5 water  Hypercalcemia status post calcitonin, improving Left-sided paralysis, CT of the head  was negative MRI could not be done since the patient has a pacemaker. Patient       might have had a CVA. I cannot tell when this has happened since patient was encephalopathic for quite some time Left-sided pneumonia continue with IV antibiotics UTI/yeast  Blood  culture positive for gram-positive cocci probably contamination  Plan  Continue same treatment DC vancomycin    code Status: Full     DVT Prophylaxis  Lovenox    Carron CurieHijazi, Matyas Baisley M.D on 08/13/2013 at 1:01 PM

## 2013-08-14 LAB — BASIC METABOLIC PANEL
BUN: 42 mg/dL — ABNORMAL HIGH (ref 6–23)
CO2: 24 mEq/L (ref 19–32)
Calcium: 9.7 mg/dL (ref 8.4–10.5)
Chloride: 100 mEq/L (ref 96–112)
Creatinine, Ser: 1.09 mg/dL (ref 0.50–1.10)
GFR calc Af Amer: 54 mL/min — ABNORMAL LOW (ref >90)
GFR, EST NON AFRICAN AMERICAN: 47 mL/min — AB (ref >90)
Glucose, Bld: 120 mg/dL — ABNORMAL HIGH (ref 70–99)
POTASSIUM: 3 meq/L — AB (ref 3.7–5.3)
SODIUM: 140 meq/L (ref 137–147)

## 2013-08-14 LAB — CBC
HCT: 32.3 % — ABNORMAL LOW (ref 36.0–46.0)
HEMOGLOBIN: 9.9 g/dL — AB (ref 12.0–15.0)
MCH: 27.6 pg (ref 26.0–34.0)
MCHC: 30.7 g/dL (ref 30.0–36.0)
MCV: 90 fL (ref 78.0–100.0)
PLATELETS: 217 10*3/uL (ref 150–400)
RBC: 3.59 MIL/uL — ABNORMAL LOW (ref 3.87–5.11)
RDW: 16.3 % — AB (ref 11.5–15.5)
WBC: 15 10*3/uL — ABNORMAL HIGH (ref 4.0–10.5)

## 2013-08-14 NOTE — Progress Notes (Signed)
[facility]                                                                                              Progress note     Patient Demographics  [name], is a 80 y.o. female  GYB:[id number]  [id number]:[id number]  DOB - Jan 09, 1933  Admit date - 07/08/2013  Admitting Physician Angela Morrison, MD  Outpatient Primary MD for the patient is Angela Divine, MD  LOS - 37  Chief complaint  Respiratory failure  Abdominal wound      Subjective:   [name] mode alert and awake but still confused .  Objective:   Vital signs  Temperature 97.6 Heart rate 70 Respiratory rate 18 Blood pressure 117/78 Pulse ox 99 %    Exam Alert and awake, confused [place].AT,PERRAL,  Supple Neck,No JVD, No cervical lymphadenopathy appriciated.  Trach in place Symmetrical Chest wall movement, diminished bilaterally with scattered rhonchi RRR,No Gallops,Rubs or new Murmurs, No Parasternal Heave Decreased B.Sounds, abdominal binder in place. Wound noted slight improvement No Cyanosis, Clubbing or edema, No new Rash or bruise  Left-sided paralysis noted    I&Os unknown Foley - yes Trach #4  Data Review   CBC  Recent Labs Lab 08/09/13 0525 08/10/13 0900 08/11/13 0740 08/12/13 0745 08/14/13 0634  WBC 19.3* 15.4* 18.3* 16.6* 15.0*  HGB 10.3* 9.9* 11.0* 10.9* 9.9*  HCT 33.7* 32.9* 36.5 35.3* 32.3*  PLT 325 326 303 265 217  MCV 92.3 90.4 91.5 91.9 90.0  MCH 28.2 27.2 27.6 28.4 27.6  MCHC 30.6 30.1 30.1 30.9 30.7  RDW 16.7* 16.4* 16.2* 16.1* 16.3*  LYMPHSABS  --   --  2.7  --   --   MONOABS  --   --  1.8*  --   --   EOSABS  --   --  0.3  --   --   BASOSABS  --   --  0.0  --   --     Chemistries   Recent Labs Lab 08/09/13 0525 08/10/13 0900 08/11/13 0740 08/12/13 0745 08/14/13 0634  NA 143 143 143 146 140  K 3.4* 3.5* 3.4* 3.4* 3.0*  CL 102 103 104 106 100  CO2 29 27 24 26 24    GLUCOSE 226* 223* 197* 157* 120*  BUN 46* 45* 47* 51* 42*  CREATININE 1.28* 1.22* 1.25* 1.24* 1.09  CALCIUM 10.2 10.1 10.3 10.3 9.7  MG  --   --  1.9  --   --   AST  --   --  42*  --   --   ALT  --   --  26  --   --   ALKPHOS  --   --  96  --   --   BILITOT  --   --  0.2*  --   --    ------------------------------------------------------------------------------------------------------------------ CrCl is unknown because both a height and weight (above a minimum accepted value) are required for this calculation. ------------------------------------------------------------------------------------------------------------------ No results found for this basename: HGBA1C,  in the last 72 hours ------------------------------------------------------------------------------------------------------------------ No results found for this basename: CHOL, HDL,  LDLCALC, TRIG, CHOLHDL, LDLDIRECT,  in the last 72 hours ------------------------------------------------------------------------------------------------------------------ No results found for this basename: TSH, T4TOTAL, FREET3, T3FREE, THYROIDAB,  in the last 72 hours ------------------------------------------------------------------------------------------------------------------ No results found for this basename: VITAMINB12, FOLATE, FERRITIN, TIBC, IRON, RETICCTPCT,  in the last 72 hours  Coagulation profile No results found for this basename: INR, PROTIME,  in the last 168 hours  No results found for this basename: DDIMER,  in the last 72 hours  Cardiac Enzymes No results found for this basename: CK, CKMB, TROPONINI, MYOGLOBIN,  in the last 168 hours ------------------------------------------------------------------------------------------------------------------ No components found with this basename: POCBNP,   Micro Results Recent Results (from the past 240 hour(s))  CULTURE, BLOOD (ROUTINE X 2)     Status: None   Collection Time     08/11/13 12:10 PM      Result Value Ref Range Status   Specimen Description BLOOD LEFT HAND   Final   Special Requests BOTTLES DRAWN AEROBIC ONLY 2CC   Final   Culture  Setup Time     Final   Value: 08/11/2013 17:55     [facility]   Culture     Final   Value: STAPHYLOCOCCUS SPECIES (COAGULASE NEGATIVE)     Note: THE SIGNIFICANCE OF ISOLATING THIS ORGANISM FROM A SINGLE SET OF BLOOD CULTURES WHEN MULTIPLE SETS ARE DRAWN IS UNCERTAIN. PLEASE NOTIFY THE MICROBIOLOGY DEPARTMENT WITHIN ONE WEEK IF SPECIATION AND SENSITIVITIES ARE REQUIRED.     Note: Gram Stain Report Called to,Read Back By and Verified With: Delynn FlavinLANRINDA WILLIAMS RN EDMOJ [phone]     [facility]   Report Status 08/13/2013 FINAL   Final  CULTURE, BLOOD (ROUTINE X 2)     Status: None   Collection Time    08/11/13 12:25 PM      Result Value Ref Range Status   Specimen Description BLOOD LEFT ANTECUBITAL   Final   Special Requests BOTTLES DRAWN AEROBIC ONLY 3CC   Final   Culture  Setup Time     Final   Value: 08/11/2013 17:51     [facility]   Culture     Final   Value:        BLOOD CULTURE RECEIVED NO GROWTH TO DATE CULTURE WILL BE HELD FOR 5 DAYS BEFORE ISSUING A FINAL NEGATIVE REPORT     [facility]   Report Status PENDING   Incomplete     Repeat CT of the head was negative for acute events Chest x-ray shows left-sided pneumonia Assessment & Plan   Respiratory failure ; continue with ATC 28% Septic picture, peritonitis treated Abdominal wound; abdominal wound care to follow, still has wound VAC  Chronic atrial Fib status post pacemaker, rate controlled Acute on chronic renal failure; resolved Klebsiella UTI; treated Protein calorie malnutrition continue with TPN patient pulled the NG tube out , family refused NG tube replacement Anemia Diabetes mellitus type 2 on Levemir and NovoLog Hypothyroidism continue with Synthroid Anoxic  encephalopathy; improving History of retroperitoneal hematoma with no signs of acute bleed History of multidrug resistant Pseudomonas pneumonia treated with tobramycin in the past. PEG tube pulled out with no leakage noted with contrast Hypernatremia on D5 water  Hypercalcemia status post calcitonin, improving Left-sided paralysis, CT of the head  was negative MRI could not be done since the patient has a pacemaker. Patient       might have had a CVA. I cannot tell when this  has happened since patient was encephalopathic for quite some time Left-sided pneumonia continue with IV antibiotics UTI/yeast  Blood culture positive for gram-positive cocci probably contamination  Plan  Replete potassium Check labs in a.m. Probable discharge in a.m. if stable on by mouth antibiotics    code Status: Full     DVT Prophylaxis  Lovenox    Carron Curie M.D on 08/14/2013 at 2:36 PM

## 2013-08-15 LAB — DIFFERENTIAL
BASOS ABS: 0 10*3/uL (ref 0.0–0.1)
Basophils Relative: 0 % (ref 0–1)
Eosinophils Absolute: 0.2 10*3/uL (ref 0.0–0.7)
Eosinophils Relative: 1 % (ref 0–5)
Lymphocytes Relative: 15 % (ref 12–46)
Lymphs Abs: 2.2 10*3/uL (ref 0.7–4.0)
Monocytes Absolute: 1.5 10*3/uL — ABNORMAL HIGH (ref 0.1–1.0)
Monocytes Relative: 10 % (ref 3–12)
Neutro Abs: 10.9 10*3/uL — ABNORMAL HIGH (ref 1.7–7.7)
Neutrophils Relative %: 74 % (ref 43–77)

## 2013-08-15 LAB — BASIC METABOLIC PANEL
BUN: 42 mg/dL — ABNORMAL HIGH (ref 6–23)
CALCIUM: 9.9 mg/dL (ref 8.4–10.5)
CO2: 23 mEq/L (ref 19–32)
Chloride: 98 mEq/L (ref 96–112)
Creatinine, Ser: 1.05 mg/dL (ref 0.50–1.10)
GFR calc non Af Amer: 49 mL/min — ABNORMAL LOW (ref >90)
GFR, EST AFRICAN AMERICAN: 57 mL/min — AB (ref >90)
Glucose, Bld: 261 mg/dL — ABNORMAL HIGH (ref 70–99)
Potassium: 4 mEq/L (ref 3.7–5.3)
Sodium: 136 mEq/L — ABNORMAL LOW (ref 137–147)

## 2013-08-15 LAB — CBC
HCT: 34.4 % — ABNORMAL LOW (ref 36.0–46.0)
Hemoglobin: 10.5 g/dL — ABNORMAL LOW (ref 12.0–15.0)
MCH: 27.6 pg (ref 26.0–34.0)
MCHC: 30.5 g/dL (ref 30.0–36.0)
MCV: 90.3 fL (ref 78.0–100.0)
PLATELETS: 236 10*3/uL (ref 150–400)
RBC: 3.81 MIL/uL — ABNORMAL LOW (ref 3.87–5.11)
RDW: 16.5 % — AB (ref 11.5–15.5)
WBC: 14.8 10*3/uL — AB (ref 4.0–10.5)

## 2013-08-15 LAB — PHOSPHORUS: PHOSPHORUS: 3.4 mg/dL (ref 2.3–4.6)

## 2013-08-15 LAB — TRIGLYCERIDES: TRIGLYCERIDES: 269 mg/dL — AB (ref <150)

## 2013-08-15 LAB — MAGNESIUM: MAGNESIUM: 1.8 mg/dL (ref 1.5–2.5)

## 2013-08-15 LAB — PREALBUMIN: PREALBUMIN: 26.1 mg/dL (ref 17.0–34.0)

## 2013-08-17 LAB — CULTURE, BLOOD (ROUTINE X 2): Culture: NO GROWTH

## 2013-10-25 ENCOUNTER — Encounter: Payer: Self-pay | Admitting: Obstetrics & Gynecology

## 2013-11-18 ENCOUNTER — Ambulatory Visit: Payer: Medicare Other | Admitting: Obstetrics & Gynecology

## 2014-01-25 ENCOUNTER — Ambulatory Visit: Payer: Medicare Other | Admitting: Obstetrics & Gynecology

## 2014-07-07 ENCOUNTER — Encounter (HOSPITAL_COMMUNITY): Payer: Self-pay | Admitting: Emergency Medicine

## 2014-07-07 ENCOUNTER — Emergency Department (HOSPITAL_COMMUNITY)
Admission: EM | Admit: 2014-07-07 | Discharge: 2014-07-07 | Disposition: A | Discharge status: Home / Self Care | Payer: Medicare Other | Attending: Emergency Medicine | Admitting: Emergency Medicine

## 2014-07-07 ENCOUNTER — Emergency Department (HOSPITAL_COMMUNITY): Payer: Medicare Other

## 2014-07-07 DIAGNOSIS — Z794 Long term (current) use of insulin: Secondary | ICD-10-CM | POA: Insufficient documentation

## 2014-07-07 DIAGNOSIS — Z950 Presence of cardiac pacemaker: Secondary | ICD-10-CM | POA: Diagnosis not present

## 2014-07-07 DIAGNOSIS — S63602A Unspecified sprain of left thumb, initial encounter: Secondary | ICD-10-CM | POA: Diagnosis not present

## 2014-07-07 DIAGNOSIS — Z79899 Other long term (current) drug therapy: Secondary | ICD-10-CM | POA: Diagnosis not present

## 2014-07-07 DIAGNOSIS — Z8674 Personal history of sudden cardiac arrest: Secondary | ICD-10-CM | POA: Diagnosis not present

## 2014-07-07 DIAGNOSIS — N183 Chronic kidney disease, stage 3 (moderate): Secondary | ICD-10-CM | POA: Diagnosis not present

## 2014-07-07 DIAGNOSIS — Z8673 Personal history of transient ischemic attack (TIA), and cerebral infarction without residual deficits: Secondary | ICD-10-CM | POA: Diagnosis not present

## 2014-07-07 DIAGNOSIS — Y998 Other external cause status: Secondary | ICD-10-CM | POA: Diagnosis not present

## 2014-07-07 DIAGNOSIS — E039 Hypothyroidism, unspecified: Secondary | ICD-10-CM | POA: Insufficient documentation

## 2014-07-07 DIAGNOSIS — I129 Hypertensive chronic kidney disease with stage 1 through stage 4 chronic kidney disease, or unspecified chronic kidney disease: Secondary | ICD-10-CM | POA: Diagnosis not present

## 2014-07-07 DIAGNOSIS — Y92410 Unspecified street and highway as the place of occurrence of the external cause: Secondary | ICD-10-CM | POA: Insufficient documentation

## 2014-07-07 DIAGNOSIS — S6992XA Unspecified injury of left wrist, hand and finger(s), initial encounter: Secondary | ICD-10-CM | POA: Diagnosis present

## 2014-07-07 DIAGNOSIS — Y9389 Activity, other specified: Secondary | ICD-10-CM | POA: Diagnosis not present

## 2014-07-07 DIAGNOSIS — S60012A Contusion of left thumb without damage to nail, initial encounter: Secondary | ICD-10-CM | POA: Diagnosis not present
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
## 2014-07-07 NOTE — Discharge Instructions (Signed)
Thumb Sprain °Your exam shows you have a sprained thumb. This means the ligaments around the joint have been torn. Thumb sprains usually take 3-6 weeks to heal. However, severe, unstable sprains may need to be fixed surgically. Sometimes a small piece of bone is pulled off by the ligament. If this is not treated properly, a sprained thumb can lead to a painful, weak joint. Treatment helps reduce pain and shortens the period of disability. °The thumb, and often the wrist, must remain splinted for the first 2-4 weeks to protect the joint. Keep your hand elevated and apply ice packs frequently to the injured area (20-30 minutes every 2-3 hours) for the next 2-4 days. This helps reduce swelling and control pain. Pain medicine may also be used for several days. Motion and strengthening exercises may later be prescribed for the joint to return to normal function. Be sure to see your doctor for follow-up because your thumb joint may require further support with splints, bandages or tape. Please see your doctor or go to the emergency room right away if you have increased pain despite proper treatment, or a numb, cold, or pale thumb. °Document Released: 03/27/2004 Document Revised: 05/12/2011 Document Reviewed: 02/19/2008 °[employer]® Patient Information ©2015 [employer]. This information is not intended to replace advice given to you by your health care provider. Make sure you discuss any questions you have with your health care provider. ° °
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
## 2014-08-28 ENCOUNTER — Other Ambulatory Visit: Payer: Self-pay

## 2016-02-04 IMAGING — CR DG ABD PORTABLE 1V
1 series · 1 of 1 positions shown · non-contrast
Comparison: DG ABD PORTABLE 1V dated 05/20/2013

CLINICAL DATA: Assess transit of barium

EXAM:
PORTABLE ABDOMEN - 1 VIEW

[AP]
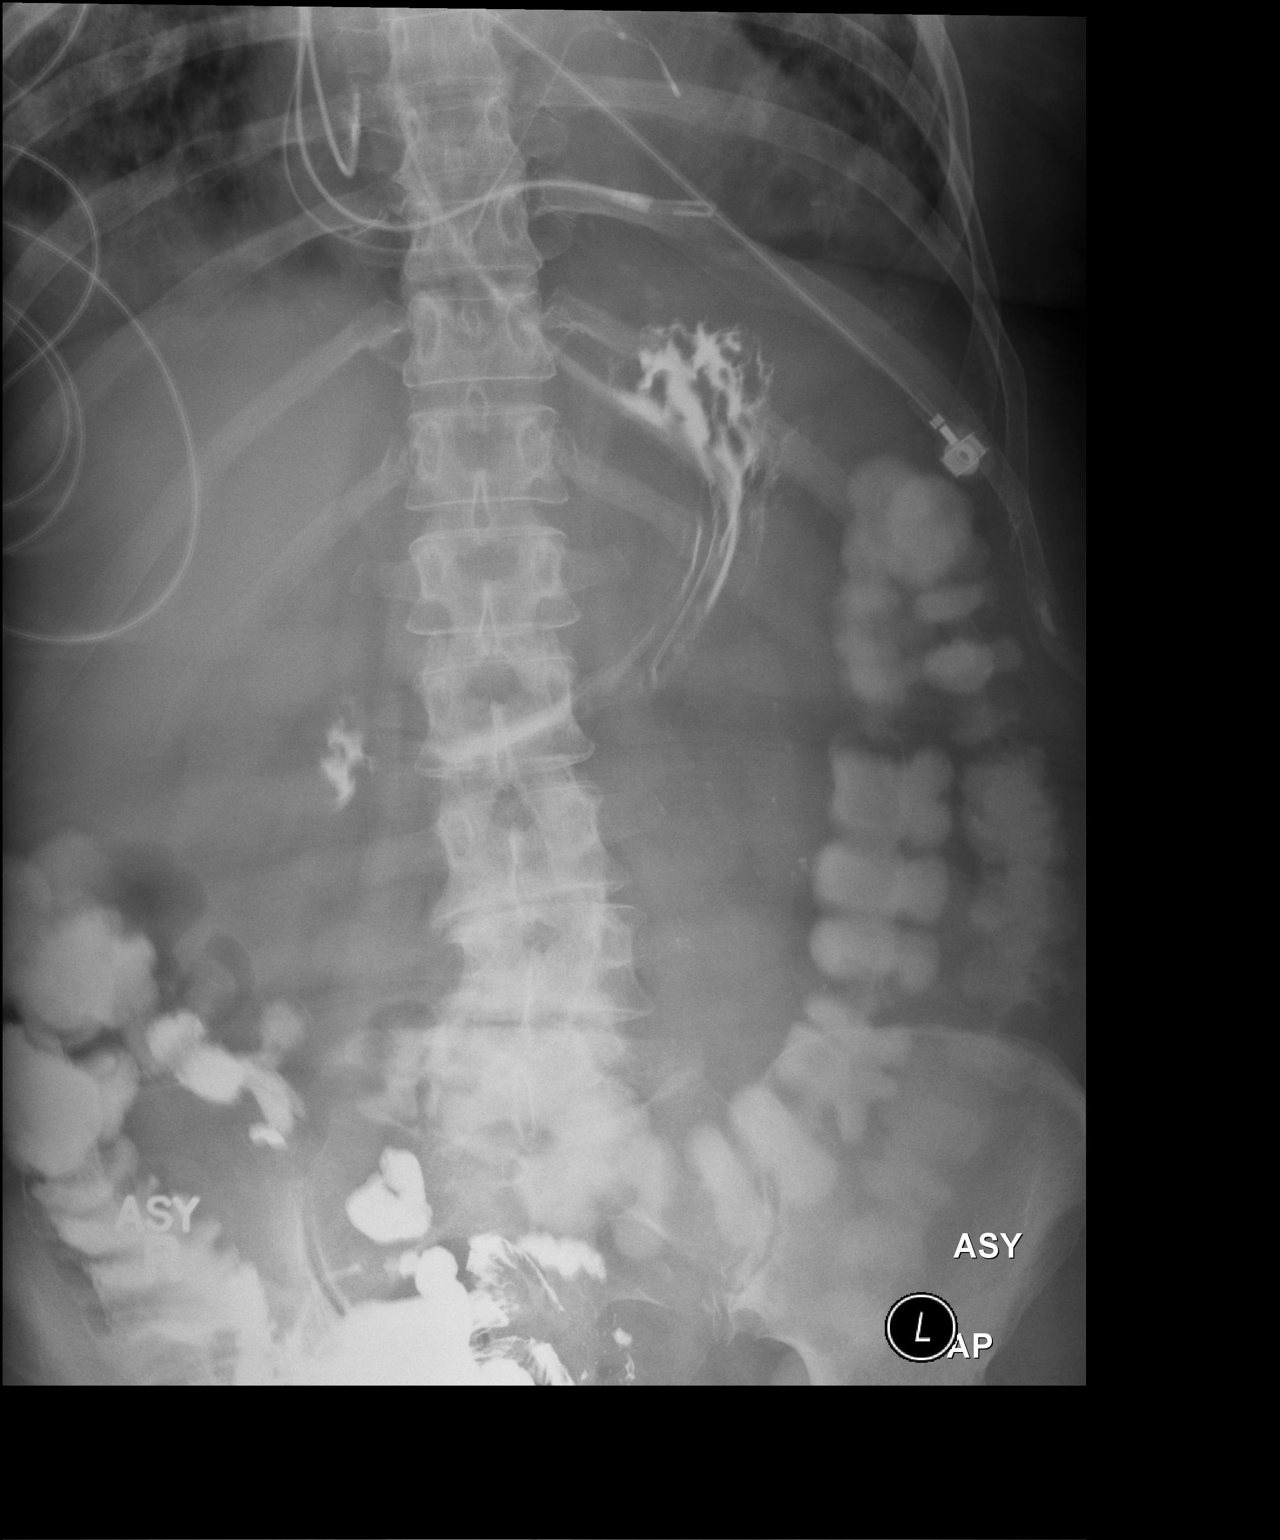

[1 of 1 positions shown; findings below may reference images not displayed]

FINDINGS: The current study is obtained approximately 6 hr after the previous
study. There is barium within the ascending and transverse and
descending portions of the colon. There remains a small amount of
barium in the stomach. Barium has progressed in the small-bowel
loops but there remains considerable barium within small bowel loops
in the pelvis.
IMPRESSION: There is barium now as far distally as the junction of the
descending colon with the sigmoid colon. Less barium is seen in the
stomach as well as within small bowel loops.

## 2016-02-04 IMAGING — XA IR PERC PLACEMENT GASTROSTOMY
1 series · 1 of 1 positions shown · non-contrast
Comparison: none

CLINICAL DATA: Respiratory failure end retroperitoneal hemorrhage.
The patient requires a gastrostomy to for further nutritional needs.

[Series 2: fl cto · 1 of 1 slices shown]
[im 1/1]
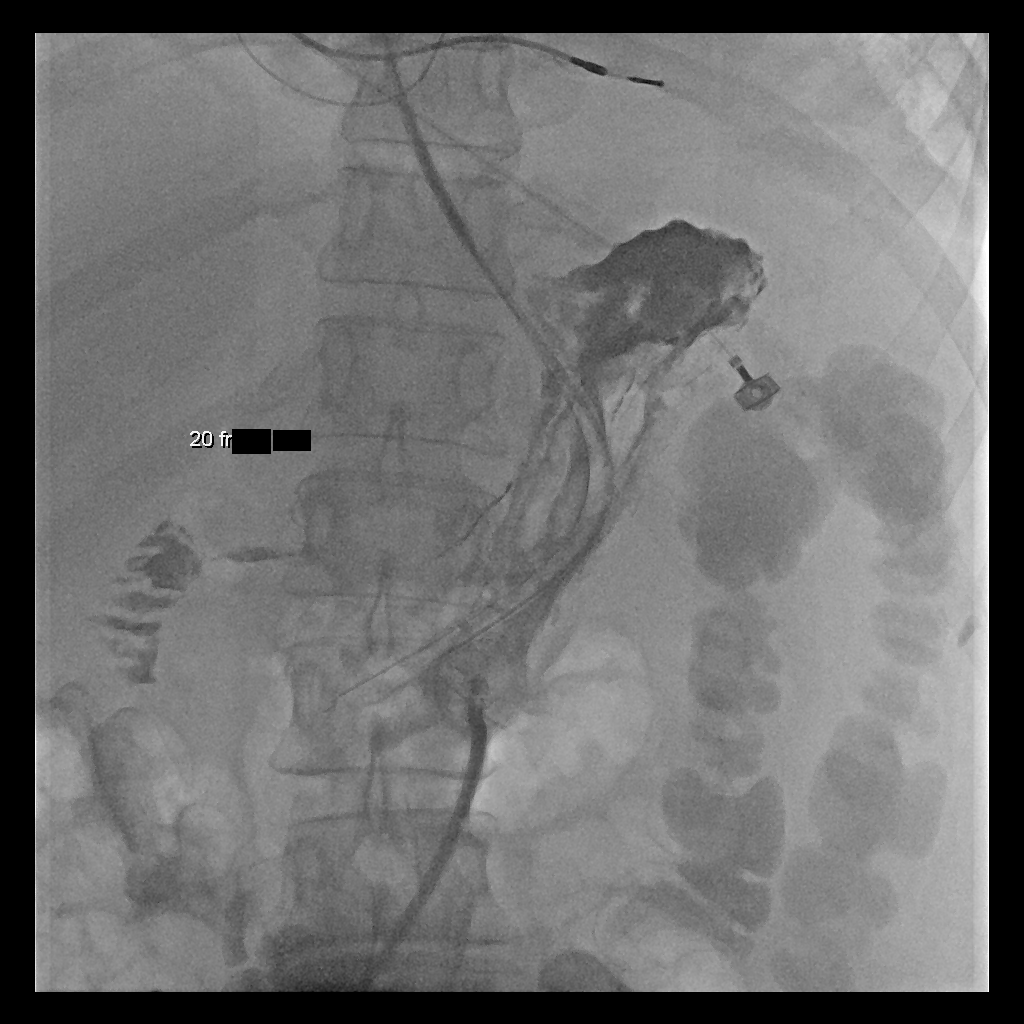

[1 of 1 positions shown; findings below may reference images not displayed]

EXAM:
PERCUTANEOUS GASTROSTOMY TUBE PLACEMENT

ANESTHESIA/SEDATION:
4.0 mg IV Versed; 100 mcg IV Fentanyl.

Total Moderate Sedation Time

10 minutes.

CONTRAST:  20mL OMNIPAQUE IOHEXOL 300 MG/ML  SOLN

MEDICATIONS:
No additional medications.

FLUOROSCOPY TIME:  1 min and 12 seconds. Minutes.

PROCEDURE:
The procedure, risks, benefits, and alternatives were explained to
the patient's daughter. Questions regarding the procedure were
encouraged and answered. Consent was obtained from the patient's
daughter.

The evening prior to the procedure, the patient was given thin
liquid barium via a nasogastric tube in order to opacify the colon.
A 5-French catheter was then advanced through the the patient's
mouth under fluoroscopy into the esophagus and to the level of the
stomach. This catheter was used to insufflate the stomach with air
under fluoroscopy.

The abdominal wall was prepped with Betadine in a sterile fashion,
and a sterile drape was applied covering the operative field. A
sterile gown and sterile gloves were used for the procedure. Local
anesthesia was provided with 1% Lidocaine.

A skin incision was made in the upper abdominal wall. Under
fluoroscopy, an 18 gauge trocar needle was advanced into the
stomach. Contrast injection was performed to confirm intraluminal
position of the needle tip. A single T tack was then deployed in the
lumen of the stomach. This was brought up to tension at the skin
surface.

Over a guidewire, a 9-French sheath was advanced into the lumen of
the stomach. The wire was left in place as a safety wire. A loop
snare device from a percutaneous gastrostomy kit was then advanced
into the stomach.

A floppy guide wire was advanced through the orogastric catheter
under fluoroscopy in the stomach. The loop snare advanced through
the percutaneous gastric access was used to snare the guide wire.
This allowed withdrawal of the loop snare out of the patient's mouth
by retraction of the orogastric catheter and wire.

A 20-French bumper retention gastrostomy tube was looped around the
snare device. It was then pulled back through the patient's mouth.
The retention bumper was brought up to the anterior gastric wall.
The T tack suture was cut at the skin. The exiting gastrostomy tube
was cut to appropriate length and a feeding adapter applied. The
catheter was injected with contrast material to confirm position and
a fluoroscopic spot image saved. The tube was then flushed with
saline. A dressing was applied over the gastrostomy exit site.

COMPLICATIONS:
None.
FINDINGS: Initial fluoroscopy demonstrates adequate opacification of the colon
by barium in order to prevent colonic injury during the procedure.
The stomach distended well with air allowing safe placement of the
gastrostomy tube. After placement, the tip of the gastrostomy tube
lies in the body of the stomach.
IMPRESSION: Percutaneous gastrostomy with placement of a 20-French bumper
retention tube in the body of the stomach. This tube can be used for
percutaneous feeds beginning in 24 hours after placement.

## 2016-02-04 IMAGING — CR DG ABD PORTABLE 1V
1 series · 1 of 1 positions shown · non-contrast
Comparison: DG ABD PORTABLE 1V dated 05/14/2013; CT ABD/PELVIS W CM
dated 05/12/2013

CLINICAL DATA: Evaluate barium prior to potential gastrostomy tube
placement.

EXAM:
PORTABLE ABDOMEN - 1 VIEW

[AP]
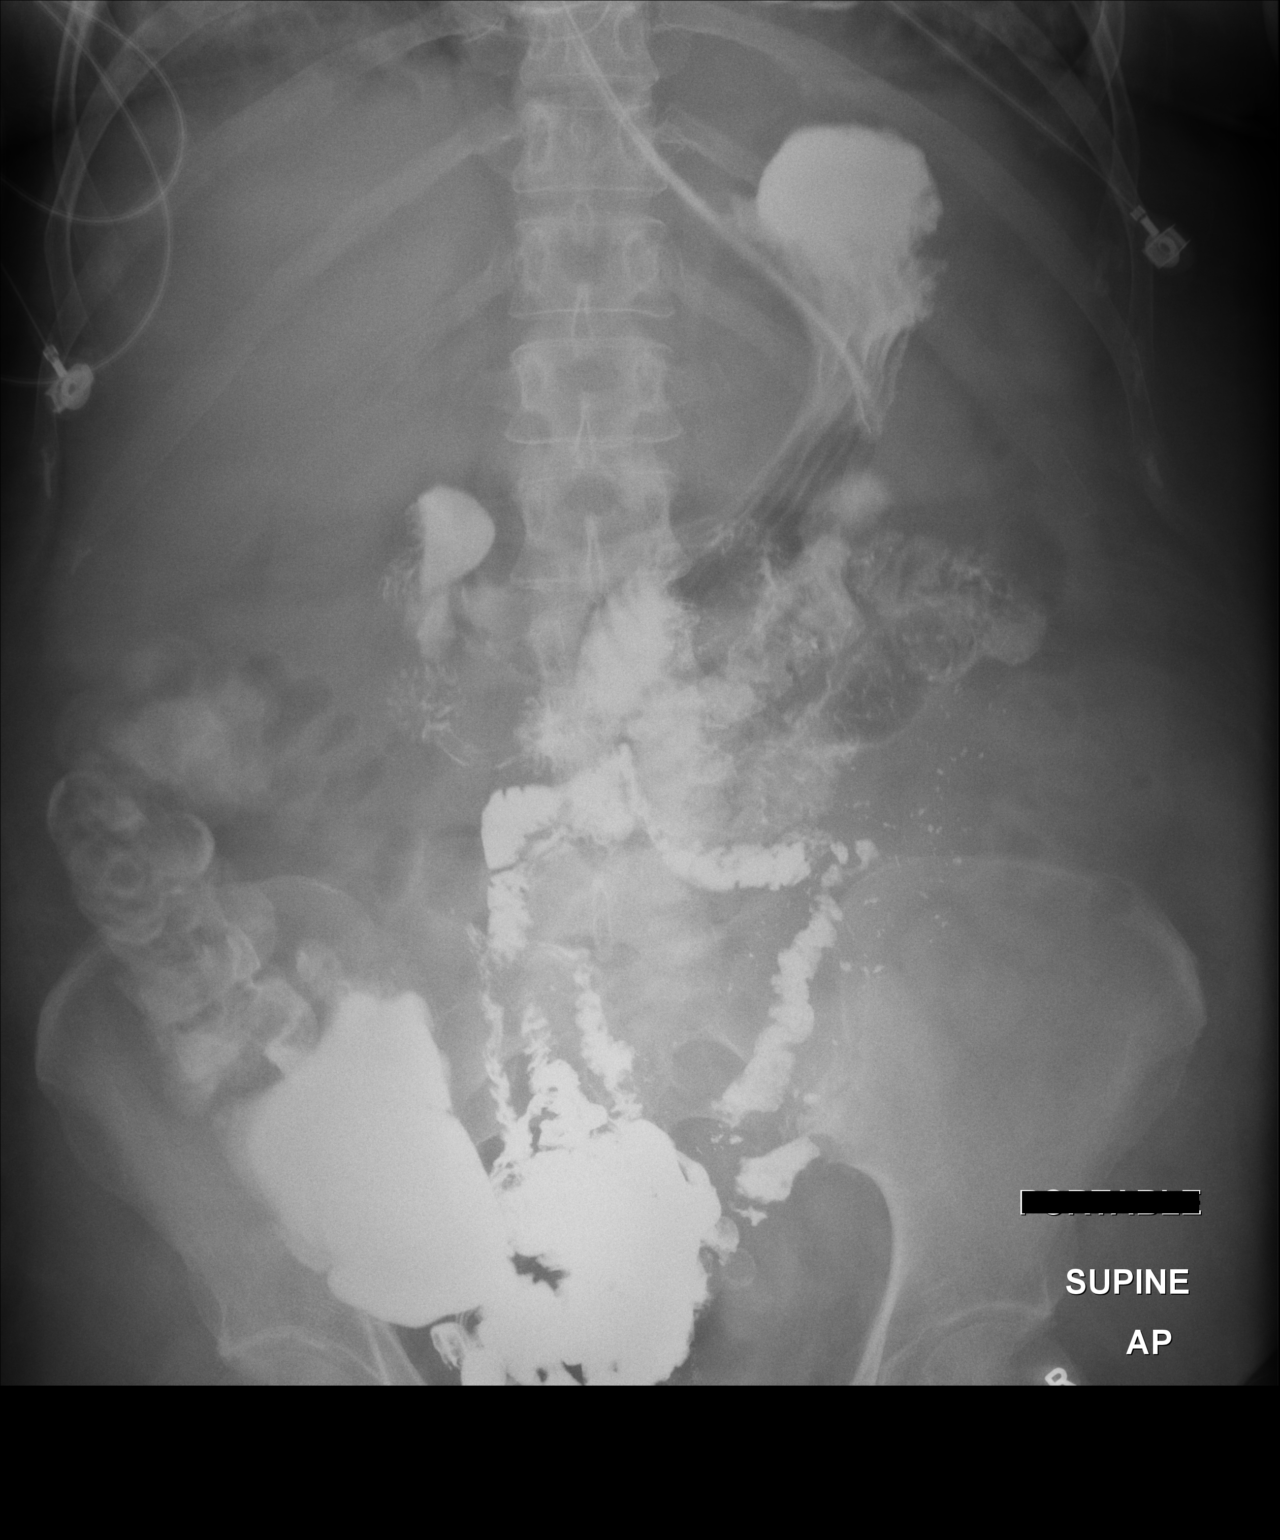

[1 of 1 positions shown; findings below may reference images not displayed]

FINDINGS: Administered enteric contrast extends to the level of the cecum and
ascending colon. There is a moderate amount of residual barium
within the gastric antrum.

No evidence of enteric obstruction.

Enteric tube overlies the gastric fundus.

Nondiagnostic evaluation for pneumoperitoneum. No definite
pneumatosis or portal venous gas.

No acute osseus abnormalities.
IMPRESSION: 1. Enteric contrast extends the level of the cecum and ascending
colon.
2. Moderate amount of retained barium within the gastric antrum.

## 2016-02-06 IMAGING — CR DG CHEST 1V PORT
1 series · 1 of 1 positions shown · non-contrast
Comparison: Portable exam 6641 hr compared to 05/20/2013

CLINICAL DATA: Respiratory failure

EXAM:
PORTABLE CHEST - 1 VIEW

[AP]
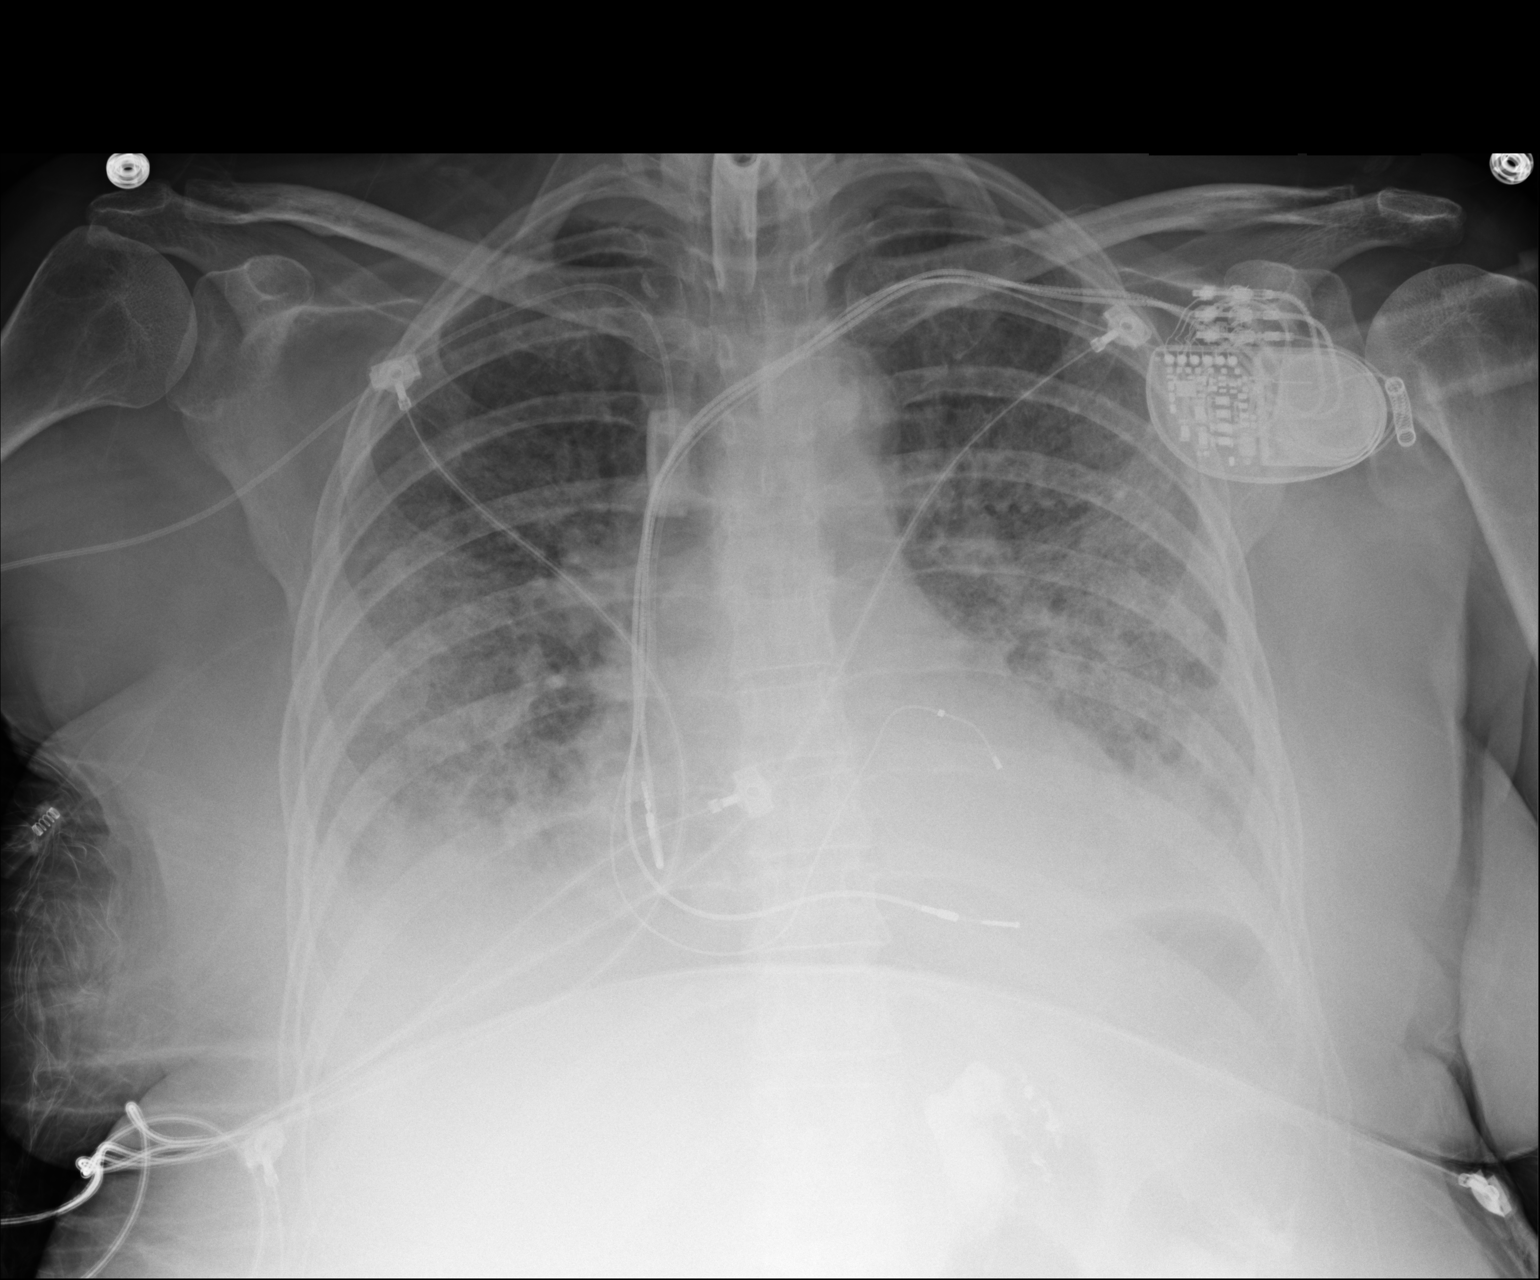

[1 of 1 positions shown; findings below may reference images not displayed]

FINDINGS: Tracheostomy tube projects over tracheal air column.

Right arm PICC line tip projects over SVC.

Left subclavian transvenous pacemaker leads project over right
atrium, right ventricle and coronary sinus.

Upper normal heart size.

Bilateral airspace infiltrates little changed.

Probable small right pleural effusion.

No pneumothorax.
IMPRESSION: Persistent bilateral airspace infiltrates M small probable right
pleural effusion.
